# Patient Record
Sex: Male | Born: 1935 | Race: White | Hispanic: No | Smoking: Former smoker
Health system: Southern US, Community
[De-identification: ages and names within clinical notes are randomized; demographics above are authoritative.]

## PROBLEM LIST (undated history)

## (undated) DIAGNOSIS — L02519 Cutaneous abscess of unspecified hand: Secondary | ICD-10-CM

## (undated) DIAGNOSIS — I10 Essential (primary) hypertension: Secondary | ICD-10-CM

## (undated) DIAGNOSIS — F329 Major depressive disorder, single episode, unspecified: Secondary | ICD-10-CM

## (undated) DIAGNOSIS — M48061 Spinal stenosis, lumbar region without neurogenic claudication: Secondary | ICD-10-CM

## (undated) DIAGNOSIS — R21 Rash and other nonspecific skin eruption: Secondary | ICD-10-CM

## (undated) DIAGNOSIS — L0390 Cellulitis, unspecified: Secondary | ICD-10-CM

## (undated) DIAGNOSIS — N200 Calculus of kidney: Secondary | ICD-10-CM

## (undated) DIAGNOSIS — Z9981 Dependence on supplemental oxygen: Secondary | ICD-10-CM

## (undated) DIAGNOSIS — G2581 Restless legs syndrome: Secondary | ICD-10-CM

## (undated) DIAGNOSIS — L03019 Cellulitis of unspecified finger: Secondary | ICD-10-CM

## (undated) DIAGNOSIS — K279 Peptic ulcer, site unspecified, unspecified as acute or chronic, without hemorrhage or perforation: Secondary | ICD-10-CM

## (undated) DIAGNOSIS — K219 Gastro-esophageal reflux disease without esophagitis: Secondary | ICD-10-CM

## (undated) DIAGNOSIS — R0602 Shortness of breath: Secondary | ICD-10-CM

## (undated) DIAGNOSIS — M5137 Other intervertebral disc degeneration, lumbosacral region: Secondary | ICD-10-CM

## (undated) DIAGNOSIS — C189 Malignant neoplasm of colon, unspecified: Secondary | ICD-10-CM

## (undated) DIAGNOSIS — J439 Emphysema, unspecified: Secondary | ICD-10-CM

## (undated) DIAGNOSIS — F411 Generalized anxiety disorder: Secondary | ICD-10-CM

## (undated) DIAGNOSIS — J449 Chronic obstructive pulmonary disease, unspecified: Secondary | ICD-10-CM

## (undated) DIAGNOSIS — E119 Type 2 diabetes mellitus without complications: Secondary | ICD-10-CM

## (undated) DIAGNOSIS — J189 Pneumonia, unspecified organism: Secondary | ICD-10-CM

## (undated) DIAGNOSIS — J45909 Unspecified asthma, uncomplicated: Secondary | ICD-10-CM

## (undated) DIAGNOSIS — L0291 Cutaneous abscess, unspecified: Secondary | ICD-10-CM

## (undated) DIAGNOSIS — E785 Hyperlipidemia, unspecified: Secondary | ICD-10-CM

## (undated) HISTORY — DX: Peptic ulcer, site unspecified, unspecified as acute or chronic, without hemorrhage or perforation: K27.9

## (undated) HISTORY — DX: Chronic obstructive pulmonary disease, unspecified: J44.9

## (undated) HISTORY — DX: Cellulitis of unspecified finger: L03.019

## (undated) HISTORY — DX: Spinal stenosis, lumbar region without neurogenic claudication: M48.061

## (undated) HISTORY — DX: Major depressive disorder, single episode, unspecified: F32.9

## (undated) HISTORY — DX: Cutaneous abscess, unspecified: L02.91

## (undated) HISTORY — DX: Type 2 diabetes mellitus without complications: E11.9

## (undated) HISTORY — DX: Emphysema, unspecified: J43.9

## (undated) HISTORY — DX: Gastro-esophageal reflux disease without esophagitis: K21.9

## (undated) HISTORY — DX: Hyperlipidemia, unspecified: E78.5

## (undated) HISTORY — DX: Generalized anxiety disorder: F41.1

## (undated) HISTORY — DX: Restless legs syndrome: G25.81

## (undated) HISTORY — DX: Cellulitis, unspecified: L03.90

## (undated) HISTORY — DX: Other intervertebral disc degeneration, lumbosacral region: M51.37

## (undated) HISTORY — DX: Rash and other nonspecific skin eruption: R21

## (undated) HISTORY — DX: Cutaneous abscess of unspecified hand: L02.519

## (undated) HISTORY — PX: OTHER SURGICAL HISTORY: SHX169

## (undated) HISTORY — DX: Essential (primary) hypertension: I10

## (undated) SURGERY — ENDOBRONCHIAL ULTRASOUND (EBUS)
Anesthesia: General

---

## 1958-09-19 HISTORY — PX: TONSILLECTOMY: SUR1361

## 1980-09-19 HISTORY — PX: FOOT NEUROMA SURGERY: SHX646

## 1998-09-20 ENCOUNTER — Emergency Department (HOSPITAL_COMMUNITY): Admission: EM | Admit: 1998-09-20 | Discharge: 1998-09-20 | Payer: Self-pay | Admitting: Emergency Medicine

## 1998-09-20 ENCOUNTER — Encounter: Payer: Self-pay | Admitting: Emergency Medicine

## 2000-02-20 ENCOUNTER — Emergency Department (HOSPITAL_COMMUNITY): Admission: EM | Admit: 2000-02-20 | Discharge: 2000-02-20 | Payer: Self-pay | Admitting: Emergency Medicine

## 2000-02-20 ENCOUNTER — Encounter: Payer: Self-pay | Admitting: Emergency Medicine

## 2000-02-22 ENCOUNTER — Emergency Department (HOSPITAL_COMMUNITY): Admission: EM | Admit: 2000-02-22 | Discharge: 2000-02-22 | Payer: Self-pay | Admitting: *Deleted

## 2000-02-22 ENCOUNTER — Encounter: Payer: Self-pay | Admitting: *Deleted

## 2001-03-15 ENCOUNTER — Encounter: Payer: Self-pay | Admitting: Internal Medicine

## 2001-03-15 ENCOUNTER — Ambulatory Visit (HOSPITAL_COMMUNITY): Admission: RE | Admit: 2001-03-15 | Discharge: 2001-03-15 | Payer: Self-pay | Admitting: Internal Medicine

## 2001-05-14 ENCOUNTER — Inpatient Hospital Stay (HOSPITAL_COMMUNITY): Admission: RE | Admit: 2001-05-14 | Discharge: 2001-05-16 | Payer: Self-pay | Admitting: Orthopedic Surgery

## 2001-09-19 HISTORY — PX: ROTATOR CUFF REPAIR: SHX139

## 2002-05-29 ENCOUNTER — Encounter: Admission: RE | Admit: 2002-05-29 | Discharge: 2002-05-29 | Payer: Self-pay | Admitting: Internal Medicine

## 2002-05-29 ENCOUNTER — Encounter: Payer: Self-pay | Admitting: Internal Medicine

## 2003-10-21 ENCOUNTER — Inpatient Hospital Stay (HOSPITAL_COMMUNITY): Admission: EM | Admit: 2003-10-21 | Discharge: 2003-10-27 | Payer: Self-pay | Admitting: Internal Medicine

## 2004-02-20 ENCOUNTER — Ambulatory Visit (HOSPITAL_COMMUNITY): Admission: RE | Admit: 2004-02-20 | Discharge: 2004-02-20 | Payer: Self-pay | Admitting: Surgery

## 2004-02-20 ENCOUNTER — Encounter (INDEPENDENT_AMBULATORY_CARE_PROVIDER_SITE_OTHER): Payer: Self-pay | Admitting: *Deleted

## 2004-02-20 ENCOUNTER — Ambulatory Visit (HOSPITAL_BASED_OUTPATIENT_CLINIC_OR_DEPARTMENT_OTHER): Admission: RE | Admit: 2004-02-20 | Discharge: 2004-02-20 | Payer: Self-pay | Admitting: Surgery

## 2004-08-24 ENCOUNTER — Encounter: Admission: RE | Admit: 2004-08-24 | Discharge: 2004-08-24 | Payer: Self-pay | Admitting: Internal Medicine

## 2006-10-07 ENCOUNTER — Emergency Department (HOSPITAL_COMMUNITY): Admission: EM | Admit: 2006-10-07 | Discharge: 2006-10-07 | Payer: Self-pay | Admitting: Emergency Medicine

## 2007-08-05 ENCOUNTER — Emergency Department (HOSPITAL_COMMUNITY): Admission: EM | Admit: 2007-08-05 | Discharge: 2007-08-05 | Payer: Self-pay | Admitting: Emergency Medicine

## 2008-01-21 ENCOUNTER — Ambulatory Visit: Payer: Self-pay | Admitting: Cardiology

## 2008-01-21 ENCOUNTER — Inpatient Hospital Stay (HOSPITAL_COMMUNITY): Admission: EM | Admit: 2008-01-21 | Discharge: 2008-01-24 | Payer: Self-pay | Admitting: Emergency Medicine

## 2008-01-22 ENCOUNTER — Encounter (INDEPENDENT_AMBULATORY_CARE_PROVIDER_SITE_OTHER): Payer: Self-pay | Admitting: Internal Medicine

## 2008-01-24 ENCOUNTER — Ambulatory Visit: Payer: Self-pay | Admitting: Psychiatry

## 2008-11-17 ENCOUNTER — Emergency Department: Payer: Self-pay | Admitting: Emergency Medicine

## 2008-11-30 ENCOUNTER — Emergency Department (HOSPITAL_COMMUNITY): Admission: EM | Admit: 2008-11-30 | Discharge: 2008-12-01 | Payer: Self-pay | Admitting: Emergency Medicine

## 2009-02-24 ENCOUNTER — Emergency Department: Payer: Self-pay | Admitting: Unknown Physician Specialty

## 2009-02-25 ENCOUNTER — Emergency Department (HOSPITAL_COMMUNITY): Admission: EM | Admit: 2009-02-25 | Discharge: 2009-02-25 | Payer: Self-pay | Admitting: Emergency Medicine

## 2009-02-28 ENCOUNTER — Ambulatory Visit (HOSPITAL_COMMUNITY): Admission: RE | Admit: 2009-02-28 | Discharge: 2009-02-28 | Payer: Self-pay | Admitting: Emergency Medicine

## 2009-03-02 ENCOUNTER — Emergency Department (HOSPITAL_COMMUNITY): Admission: EM | Admit: 2009-03-02 | Discharge: 2009-03-02 | Payer: Self-pay | Admitting: Emergency Medicine

## 2009-03-09 ENCOUNTER — Emergency Department (HOSPITAL_COMMUNITY): Admission: EM | Admit: 2009-03-09 | Discharge: 2009-03-10 | Payer: Self-pay | Admitting: Emergency Medicine

## 2009-08-17 ENCOUNTER — Emergency Department (HOSPITAL_COMMUNITY): Admission: EM | Admit: 2009-08-17 | Discharge: 2009-08-17 | Payer: Self-pay | Admitting: Emergency Medicine

## 2009-09-27 ENCOUNTER — Emergency Department (HOSPITAL_COMMUNITY): Admission: EM | Admit: 2009-09-27 | Discharge: 2009-09-28 | Payer: Self-pay | Admitting: Emergency Medicine

## 2009-10-07 ENCOUNTER — Inpatient Hospital Stay (HOSPITAL_COMMUNITY): Admission: EM | Admit: 2009-10-07 | Discharge: 2009-10-08 | Payer: Self-pay | Admitting: Emergency Medicine

## 2009-11-03 ENCOUNTER — Ambulatory Visit: Payer: Self-pay | Admitting: Internal Medicine

## 2009-11-03 DIAGNOSIS — J441 Chronic obstructive pulmonary disease with (acute) exacerbation: Secondary | ICD-10-CM

## 2009-11-03 DIAGNOSIS — F411 Generalized anxiety disorder: Secondary | ICD-10-CM

## 2009-11-03 DIAGNOSIS — M51379 Other intervertebral disc degeneration, lumbosacral region without mention of lumbar back pain or lower extremity pain: Secondary | ICD-10-CM

## 2009-11-03 DIAGNOSIS — I10 Essential (primary) hypertension: Secondary | ICD-10-CM

## 2009-11-03 DIAGNOSIS — G2581 Restless legs syndrome: Secondary | ICD-10-CM | POA: Insufficient documentation

## 2009-11-03 DIAGNOSIS — R21 Rash and other nonspecific skin eruption: Secondary | ICD-10-CM

## 2009-11-03 DIAGNOSIS — M48061 Spinal stenosis, lumbar region without neurogenic claudication: Secondary | ICD-10-CM

## 2009-11-03 DIAGNOSIS — J439 Emphysema, unspecified: Secondary | ICD-10-CM

## 2009-11-03 DIAGNOSIS — K219 Gastro-esophageal reflux disease without esophagitis: Secondary | ICD-10-CM

## 2009-11-03 DIAGNOSIS — F329 Major depressive disorder, single episode, unspecified: Secondary | ICD-10-CM

## 2009-11-03 DIAGNOSIS — E119 Type 2 diabetes mellitus without complications: Secondary | ICD-10-CM

## 2009-11-03 DIAGNOSIS — J4489 Other specified chronic obstructive pulmonary disease: Secondary | ICD-10-CM | POA: Insufficient documentation

## 2009-11-03 DIAGNOSIS — F3289 Other specified depressive episodes: Secondary | ICD-10-CM

## 2009-11-03 DIAGNOSIS — K279 Peptic ulcer, site unspecified, unspecified as acute or chronic, without hemorrhage or perforation: Secondary | ICD-10-CM | POA: Insufficient documentation

## 2009-11-03 DIAGNOSIS — J449 Chronic obstructive pulmonary disease, unspecified: Secondary | ICD-10-CM

## 2009-11-03 DIAGNOSIS — J45909 Unspecified asthma, uncomplicated: Secondary | ICD-10-CM | POA: Insufficient documentation

## 2009-11-03 DIAGNOSIS — E785 Hyperlipidemia, unspecified: Secondary | ICD-10-CM

## 2009-11-03 DIAGNOSIS — M5137 Other intervertebral disc degeneration, lumbosacral region: Secondary | ICD-10-CM | POA: Insufficient documentation

## 2009-11-03 DIAGNOSIS — Z87442 Personal history of urinary calculi: Secondary | ICD-10-CM

## 2009-11-03 HISTORY — DX: Other intervertebral disc degeneration, lumbosacral region without mention of lumbar back pain or lower extremity pain: M51.379

## 2009-11-03 HISTORY — DX: Generalized anxiety disorder: F41.1

## 2009-11-03 HISTORY — DX: Hyperlipidemia, unspecified: E78.5

## 2009-11-03 HISTORY — DX: Other specified depressive episodes: F32.89

## 2009-11-03 HISTORY — DX: Rash and other nonspecific skin eruption: R21

## 2009-11-03 HISTORY — DX: Type 2 diabetes mellitus without complications: E11.9

## 2009-11-03 HISTORY — DX: Gastro-esophageal reflux disease without esophagitis: K21.9

## 2009-11-03 HISTORY — DX: Other intervertebral disc degeneration, lumbosacral region: M51.37

## 2009-11-03 HISTORY — DX: Essential (primary) hypertension: I10

## 2009-11-03 HISTORY — DX: Emphysema, unspecified: J43.9

## 2009-11-03 HISTORY — DX: Major depressive disorder, single episode, unspecified: F32.9

## 2009-11-03 HISTORY — DX: Peptic ulcer, site unspecified, unspecified as acute or chronic, without hemorrhage or perforation: K27.9

## 2009-11-03 HISTORY — DX: Restless legs syndrome: G25.81

## 2009-11-03 HISTORY — DX: Spinal stenosis, lumbar region without neurogenic claudication: M48.061

## 2009-11-03 HISTORY — DX: Other specified chronic obstructive pulmonary disease: J44.89

## 2009-11-03 HISTORY — DX: Chronic obstructive pulmonary disease, unspecified: J44.9

## 2009-11-03 LAB — CONVERTED CEMR LAB
Alkaline Phosphatase: 78 units/L (ref 39–117)
Basophils Absolute: 0.1 10*3/uL (ref 0.0–0.1)
Bilirubin Urine: NEGATIVE
Bilirubin, Direct: 0.3 mg/dL (ref 0.0–0.3)
CO2: 29 meq/L (ref 19–32)
Calcium: 9.8 mg/dL (ref 8.4–10.5)
Creatinine, Ser: 1.1 mg/dL (ref 0.4–1.5)
Eosinophils Absolute: 0.5 10*3/uL (ref 0.0–0.7)
Glucose, Bld: 264 mg/dL — ABNORMAL HIGH (ref 70–99)
HDL: 56.1 mg/dL (ref >39.00)
Hemoglobin, Urine: NEGATIVE
Ketones, ur: NEGATIVE mg/dL
Leukocytes, UA: NEGATIVE
Lymphocytes Relative: 13.6 % (ref 12.0–46.0)
MCHC: 33.8 g/dL (ref 30.0–36.0)
Neutrophils Relative %: 75.2 % (ref 43.0–77.0)
Nitrite: NEGATIVE
Platelets: 237 10*3/uL (ref 150.0–400.0)
RDW: 12.4 % (ref 11.5–14.6)
TSH: 1.28 microintl units/mL (ref 0.35–5.50)
Total Protein, Urine: NEGATIVE mg/dL
VLDL: 53.8 mg/dL — ABNORMAL HIGH (ref 0.0–40.0)

## 2009-11-18 ENCOUNTER — Telehealth: Payer: Self-pay | Admitting: Internal Medicine

## 2009-11-19 ENCOUNTER — Ambulatory Visit: Payer: Self-pay | Admitting: Internal Medicine

## 2009-11-23 ENCOUNTER — Telehealth: Payer: Self-pay | Admitting: Internal Medicine

## 2009-11-26 ENCOUNTER — Telehealth: Payer: Self-pay | Admitting: Internal Medicine

## 2009-12-03 ENCOUNTER — Ambulatory Visit: Payer: Self-pay | Admitting: Internal Medicine

## 2009-12-03 DIAGNOSIS — L0390 Cellulitis, unspecified: Secondary | ICD-10-CM

## 2009-12-03 DIAGNOSIS — L0291 Cutaneous abscess, unspecified: Secondary | ICD-10-CM

## 2009-12-03 HISTORY — DX: Cutaneous abscess, unspecified: L02.91

## 2010-01-20 ENCOUNTER — Encounter (INDEPENDENT_AMBULATORY_CARE_PROVIDER_SITE_OTHER): Payer: Self-pay | Admitting: Internal Medicine

## 2010-01-20 ENCOUNTER — Ambulatory Visit: Payer: Self-pay | Admitting: Cardiovascular Disease

## 2010-01-20 ENCOUNTER — Ambulatory Visit: Payer: Self-pay | Admitting: Vascular Surgery

## 2010-01-20 ENCOUNTER — Inpatient Hospital Stay (HOSPITAL_COMMUNITY): Admission: EM | Admit: 2010-01-20 | Discharge: 2010-01-23 | Payer: Self-pay | Admitting: Emergency Medicine

## 2010-01-22 ENCOUNTER — Encounter: Payer: Self-pay | Admitting: Cardiology

## 2010-04-07 ENCOUNTER — Ambulatory Visit: Payer: Self-pay | Admitting: Internal Medicine

## 2010-04-07 DIAGNOSIS — L02519 Cutaneous abscess of unspecified hand: Secondary | ICD-10-CM | POA: Insufficient documentation

## 2010-04-07 DIAGNOSIS — L03019 Cellulitis of unspecified finger: Secondary | ICD-10-CM

## 2010-04-07 HISTORY — DX: Cellulitis of unspecified finger: L03.019

## 2010-04-07 HISTORY — DX: Cutaneous abscess of unspecified hand: L02.519

## 2010-04-30 ENCOUNTER — Encounter: Payer: Self-pay | Admitting: Internal Medicine

## 2010-05-03 ENCOUNTER — Ambulatory Visit: Payer: Self-pay | Admitting: Internal Medicine

## 2010-05-20 ENCOUNTER — Inpatient Hospital Stay (HOSPITAL_COMMUNITY): Admission: EM | Admit: 2010-05-20 | Discharge: 2010-05-20 | Payer: Self-pay | Admitting: Emergency Medicine

## 2010-05-23 ENCOUNTER — Inpatient Hospital Stay (HOSPITAL_COMMUNITY): Admission: EM | Admit: 2010-05-23 | Discharge: 2010-05-26 | Payer: Self-pay | Admitting: Emergency Medicine

## 2010-05-27 ENCOUNTER — Ambulatory Visit: Payer: Self-pay | Admitting: Psychiatry

## 2010-07-24 ENCOUNTER — Inpatient Hospital Stay (HOSPITAL_COMMUNITY): Admission: EM | Admit: 2010-07-24 | Discharge: 2010-07-24 | Payer: Self-pay | Admitting: Emergency Medicine

## 2010-09-05 ENCOUNTER — Inpatient Hospital Stay (HOSPITAL_COMMUNITY)
Admission: EM | Admit: 2010-09-05 | Discharge: 2010-09-06 | Payer: Self-pay | Source: Home / Self Care | Attending: Internal Medicine | Admitting: Internal Medicine

## 2010-09-06 ENCOUNTER — Encounter (INDEPENDENT_AMBULATORY_CARE_PROVIDER_SITE_OTHER): Payer: Self-pay | Admitting: Internal Medicine

## 2010-09-07 ENCOUNTER — Emergency Department (HOSPITAL_COMMUNITY)
Admission: EM | Admit: 2010-09-07 | Discharge: 2010-09-08 | Payer: Self-pay | Source: Home / Self Care | Admitting: Emergency Medicine

## 2010-09-09 ENCOUNTER — Ambulatory Visit: Payer: Self-pay | Admitting: Internal Medicine

## 2010-10-10 ENCOUNTER — Encounter: Payer: Self-pay | Admitting: Internal Medicine

## 2010-10-19 NOTE — Progress Notes (Signed)
  Phone Note Refill Request  on November 18, 2009 11:30 AM  Refills Requested: Medication #1:  ADVAIR DISKUS 100-50 MCG/DOSE AEPB 1 puff in the morning 1 puff at bedtime   Dosage confirmed as above?Dosage Confirmed Initial call taken by: Scharlene Gloss,  November 18, 2009 11:30 AM    Prescriptions: ADVAIR DISKUS 100-50 MCG/DOSE AEPB (FLUTICASONE-SALMETEROL) 1 puff in the morning 1 puff at bedtime  #1 x 5   Entered by:   Scharlene Gloss   Authorized by:   [name] MD   Signed by:   Scharlene Gloss on 11/18/2009   Method used:   Faxed to ...       [facility] [place] [phone]* (retail)       [phone]       [place], [place]  [address]       Ph: [phone] or [phone]       Fax: [phone]   RxID:   [id number]

## 2010-10-19 NOTE — Progress Notes (Signed)
  Phone Note Refill Request  on November 26, 2009 4:34 PM  Refills Requested: Medication #1:  METFORMIN HCL 500 MG XR24H-TAB 3 by mouth once daily   Dosage confirmed as above?Dosage Confirmed Initial call taken by: Scharlene Gloss,  November 26, 2009 4:34 PM    Prescriptions: METFORMIN HCL 500 MG XR24H-TAB (METFORMIN HCL) 3 by mouth once daily  #90 x 3   Entered by:   Scharlene Gloss   Authorized by:   [name] MD   Signed by:   Scharlene Gloss on 11/26/2009   Method used:   Electronically to        [facility] [phone]* (retail)       [address]       [place], [place]  [address]       Ph: [phone] or [phone]       Fax: [phone]   RxID:   [id number]

## 2010-10-19 NOTE — Assessment & Plan Note (Signed)
Summary: BURNING GROIN W/2 KNOTS--STC   Vital Signs:  Patient profile:   77 year old male Height:      71 inches Weight:      198 pounds BMI:     27.72 O2 Sat:      94 % on Room air Temp:     97.2 degrees F oral Pulse rate:   87 / minute BP sitting:   128 / 72  (left arm) Cuff size:   regular  Vitals Entered ByZella Ball Ewing (December 03, 2009 11:17 AM)  O2 Flow:  Room air CC: Knots on groin, BS problems/RE   CC:  Knots on groin and BS problems/RE.  History of Present Illness: overall doing ok with cbg's somewhat variable but overall less than 140 - Pt denies polydipsia, polyuria, or low sugar symptoms such as shakiness improved with eating.  Overall good compliance with meds, trying to follow low chol, DM diet, wt stable, little excercise however   Pt denies CP, sob, doe, wheezing, orthopnea, pnd, worsening LE edema, palps, dizziness or syncope  .Pt denies new neuro symptoms such as headache, facial or extremity weakness   But unfortunately here with a new problem with 2 signficant firm tender knots x 3 days to the right scrotal/perineal area.  Denies GU symtpoms such as urgency, freq or hematuria, and no drainage.  No known hx of MRSA. No fever, chills, n/v.  Problems Prior to Update: 1)  Abscess  (ICD-682.9) 2)  Preventive Health Care  (ICD-V70.0) 3)  Rash-nonvesicular  (ICD-782.1) 4)  Chronic Obstructive Pulmonary Disease, Acute Exacerbation  (ICD-491.21) 5)  Emphysema, Bullous  (ICD-492.0) 6)  Peptic Ulcer Disease  (ICD-533.90) 7)  Gerd  (ICD-530.81) 8)  Nephrolithiasis, Hx of  (ICD-V13.01) 9)  Diabetes Mellitus, Type II  (ICD-250.00) 10)  Spinal Stenosis, Lumbar  (ICD-724.02) 11)  Disc Disease, Lumbar  (ICD-722.52) 12)  Anxiety  (ICD-300.00) 13)  Depression  (ICD-311) 14)  Restless Leg Syndrome  (ICD-333.94) 15)  Hypertension  (ICD-401.9) 16)  Hyperlipidemia  (ICD-272.4) 17)  COPD  (ICD-496) 18)  Asthma  (ICD-493.90)  Medications Prior to Update: 1)  Simvastatin 40 Mg  Tabs (Simvastatin) .Marland Kitchen.. 1 By Mouth Once Daily 2)  Hydrochlorothiazide 25 Mg Tabs (Hydrochlorothiazide) .Marland Kitchen.. 1 By Mouth Once Daily 3)  Advair Diskus 100-50 Mcg/dose Aepb (Fluticasone-Salmeterol) .Marland Kitchen.. 1 Puff in The Morning 1 Puff At Bedtime 4)  Metformin Hcl 500 Mg Xr24h-Tab (Metformin Hcl) .... 3 By Mouth Once Daily 5)  Ketoconazole 200 Mg Tabs (Ketoconazole) .Marland Kitchen.. 1po Two Times A Day For 5 Days 6)  Lotrisone 1-0.05 % Crea (Clotrimazole-Betamethasone) .... Use Asd Two Times A Day As Needed 7)  Onetouch Ultra Test  Strp (Glucose Blood) .... Use Asd 1 Once Daily   250.02 8)  Lancets  Misc (Lancets) .... Use Asd 1 Once Daily   250.02  Current Medications (verified): 1)  Simvastatin 40 Mg Tabs (Simvastatin) .Marland Kitchen.. 1 By Mouth Once Daily 2)  Hydrochlorothiazide 25 Mg Tabs (Hydrochlorothiazide) .Marland Kitchen.. 1 By Mouth Once Daily 3)  Advair Diskus 100-50 Mcg/dose Aepb (Fluticasone-Salmeterol) .Marland Kitchen.. 1 Puff in The Morning 1 Puff At Bedtime 4)  Metformin Hcl 500 Mg Xr24h-Tab (Metformin Hcl) .... 3 By Mouth Once Daily 5)  Ketoconazole 200 Mg Tabs (Ketoconazole) .Marland Kitchen.. 1po Two Times A Day For 5 Days 6)  Lotrisone 1-0.05 % Crea (Clotrimazole-Betamethasone) .... Use Asd Two Times A Day As Needed 7)  Onetouch Ultra Test  Strp (Glucose Blood) .... Use Asd 1 Once Daily  250.02 8)  Lancets  Misc (Lancets) .... Use Asd 1 Once Daily   250.02 9)  Septra Ds 800-160 Mg Tabs (Sulfamethoxazole-Trimethoprim) .Marland Kitchen.. 1po Two Times A Day  Allergies (verified): No Known Drug Allergies  Past History:  Past Medical History: Last updated: 11/03/2009 SPINAL STENOSIS, LUMBAR (ICD-724.02) DISC DISEASE, LUMBAR (ICD-722.52) ANXIETY (ICD-300.00) DEPRESSION (ICD-311) RESTLESS LEG SYNDROME (ICD-333.94) HYPERTENSION (ICD-401.9) HYPERLIPIDEMIA (ICD-272.4) COPD (ICD-496)/severe bullous underlying emphysema  ASTHMA (ICD-493.90) neg stress myoview may 2009 Diabetes mellitus, type II Nephrolithiasis, hx of GERD Peptic ulcer disease - 1987  - duodenal ulcer  Past Surgical History: Last updated: 11/03/2009 Rotator cuff repair - 2002 s/p left foot surgury - morton's neuroma Tonsillectomy  Social History: Last updated: 11/03/2009 widower since 2010 2 children - 1 lives close Alcohol use-no 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   Head:  normocephalic and atraumatic.   Eyes:  vision grossly intact, pupils equal, and pupils round.   Ears:  R ear normal and L ear normal.   Nose:  no external deformity and no nasal discharge.   Mouth:  no gingival abnormalities and pharynx pink and moist.   Neck:  supple and no masses.   Lungs:  normal respiratory effort and normal breath sounds.   Heart:  normal rate and regular rhythm.   Abdomen:  soft, non-tender, and normal bowel sounds.   Genitalia:  right scrotal/groin junction with 2 subq indruated areas approx 1cm each, without drainage, but mod tender and general area warm with mild swelling; no fluctuance noted either site;  male genitals o/w neg Extremities:  no edema, no erythema    Impression & Recommendations:  Problem # 1:  ABSCESS (ICD-682.9)  His updated medication list for this problem includes:    Septra Ds 800-160 Mg Tabs (Sulfamethoxazole-trimethoprim) .Marland Kitchen... 1po two times a day to right scrotal/groin area; treat as above, f/u any worsening signs or symptoms , no evidcne or suggestion of fournier's;  pt declines pain med; to f/u any worsening s/s  Problem # 2:  DIABETES MELLITUS, TYPE II (ICD-250.00)  His updated medication list for this problem includes:    Metformin Hcl 500 Mg Xr24h-tab (Metformin hcl) .Marland KitchenMarland KitchenMarland KitchenMarland Kitchen 3 by mouth once daily  Labs Reviewed: Creat: 1.1 (11/03/2009)    improved, Continue all previous medications as before this visit , reassured, f/u labs next  visit  Problem # 3:  HYPERTENSION (ICD-401.9)  His updated medication list for this problem includes:    Hydrochlorothiazide 25 Mg Tabs (Hydrochlorothiazide) .Marland Kitchen... 1 by mouth once daily  BP today: 128/72 Prior BP: 130/68 (11/19/2009)  Labs Reviewed: K+: 3.6 (11/03/2009) Creat: : 1.1 (11/03/2009)   Chol: 171 (11/03/2009)   HDL: 56.10 (11/03/2009)   TG: 269.0 (11/03/2009) stable overall by hx and exam, ok to continue meds/tx as is   Complete Medication List: 1)  Simvastatin 40 Mg Tabs (Simvastatin) .Marland Kitchen.. 1 by mouth once daily 2)  Hydrochlorothiazide 25 Mg Tabs (Hydrochlorothiazide) .Marland Kitchen.. 1 by mouth once daily 3)  Advair Diskus 100-50 Mcg/dose Aepb (Fluticasone-salmeterol) .Marland Kitchen.. 1 puff in the morning 1 puff at bedtime 4)  Metformin Hcl 500 Mg Xr24h-tab (Metformin hcl) .... 3 by mouth once daily 5)  Ketoconazole 200 Mg Tabs (Ketoconazole) .Marland Kitchen.. 1po two times a day for 5 days 6)  Lotrisone 1-0.05 %  Crea (Clotrimazole-betamethasone) .... Use asd two times a day as needed 7)  Onetouch Ultra Test Strp (Glucose blood) .... Use asd 1 once daily   250.02 8)  Lancets Misc (Lancets) .... Use asd 1 once daily   250.02 9)  Septra Ds 800-160 Mg Tabs (Sulfamethoxazole-trimethoprim) .Marland Kitchen.. 1po two times a day  Other Orders: Glucose, (CBG) (04540) Fingerstick (98119)  Patient Instructions: 1)  Please take all new medications as prescribed 2)  Continue all previous medications as before this visit  3)  Please schedule a follow-up appointment as needed. Prescriptions: SEPTRA DS 800-160 MG TABS (SULFAMETHOXAZOLE-TRIMETHOPRIM) 1po two times a day  #20 x 0   Entered and Authorized by:   [name] MD   Signed by:   [name] MD on 12/03/2009   Method used:   Print then Give to Patient   RxID:   [id number]

## 2010-10-19 NOTE — Assessment & Plan Note (Signed)
Summary: rash in groin area-lb   Vital Signs:  Patient profile:   77 year old male Height:      70 inches Weight:      204.50 pounds BMI:     29.45 O2 Sat:      96 % on Room air Temp:     97.3 degrees F oral Pulse rate:   98 / minute BP sitting:   130 / 68  (left arm) Cuff size:   large  Vitals Entered ByZella Ball Ewing (November 19, 2009 9:09 AM)  O2 Flow:  Room air  CC: rash in groin, discuss labs, BS elevated/RE   CC:  rash in groin, discuss labs, and BS elevated/RE.  History of Present Illness: here to f/u rash - wondering if it is worse since it still itches some to the scrotum despite completing the med;  Pt denies CP, sob, doe, wheezing, orthopnea, pnd, worsening LE edema, palps, dizziness or syncope   Pt denies new neuro symptoms such as headache, facial or extremity weakness   Pt denies polydipsia, polyuria, or low sugar symptoms such as shakiness improved with eating.  Overall good compliance with meds, trying to follow low chol, DM diet, wt stable, little excercise however Overall tolerating meds well .  CBG's at home often high 200s or 300.  No fever or chills or GU symtpoms.    Problems Prior to Update: 1)  Preventive Health Care  (ICD-V70.0) 2)  Rash-nonvesicular  (ICD-782.1) 3)  Chronic Obstructive Pulmonary Disease, Acute Exacerbation  (ICD-491.21) 4)  Emphysema, Bullous  (ICD-492.0) 5)  Peptic Ulcer Disease  (ICD-533.90) 6)  Gerd  (ICD-530.81) 7)  Nephrolithiasis, Hx of  (ICD-V13.01) 8)  Diabetes Mellitus, Type II  (ICD-250.00) 9)  Spinal Stenosis, Lumbar  (ICD-724.02) 10)  Disc Disease, Lumbar  (ICD-722.52) 11)  Anxiety  (ICD-300.00) 12)  Depression  (ICD-311) 13)  Restless Leg Syndrome  (ICD-333.94) 14)  Hypertension  (ICD-401.9) 15)  Hyperlipidemia  (ICD-272.4) 16)  COPD  (ICD-496) 17)  Asthma  (ICD-493.90)  Medications Prior to Update: 1)  Simvastatin 40 Mg Tabs (Simvastatin) .Marland Kitchen.. 1 By Mouth Once Daily 2)  Hydrochlorothiazide 25 Mg Tabs  (Hydrochlorothiazide) .Marland Kitchen.. 1 By Mouth Once Daily 3)  Advair Diskus 100-50 Mcg/dose Aepb (Fluticasone-Salmeterol) .Marland Kitchen.. 1 Puff in The Morning 1 Puff At Bedtime 4)  Metformin Hcl 500 Mg Tabs (Metformin Hcl) .Marland Kitchen.. 1po Once Daily 5)  Ketoconazole 200 Mg Tabs (Ketoconazole) .Marland Kitchen.. 1po Two Times A Day For 5 Days  Current Medications (verified): 1)  Simvastatin 40 Mg Tabs (Simvastatin) .Marland Kitchen.. 1 By Mouth Once Daily 2)  Hydrochlorothiazide 25 Mg Tabs (Hydrochlorothiazide) .Marland Kitchen.. 1 By Mouth Once Daily 3)  Advair Diskus 100-50 Mcg/dose Aepb (Fluticasone-Salmeterol) .Marland Kitchen.. 1 Puff in The Morning 1 Puff At Bedtime 4)  Metformin Hcl 500 Mg Xr24h-Tab (Metformin Hcl) .... 3 By Mouth Once Daily 5)  Ketoconazole 200 Mg Tabs (Ketoconazole) .Marland Kitchen.. 1po Two Times A Day For 5 Days 6)  Lotrisone 1-0.05 % Crea (Clotrimazole-Betamethasone) .... Use Asd Two Times A Day As Needed 7)  Onetouch Ultra Test  Strp (Glucose Blood) .... Use Asd 1 Once Daily   250.02 8)  Lancets  Misc (Lancets) .... Use Asd 1 Once Daily   250.02  Allergies (verified): No Known Drug Allergies  Past History:  Past Medical History: Last updated: 11/03/2009 SPINAL STENOSIS, LUMBAR (ICD-724.02) DISC DISEASE, LUMBAR (ICD-722.52) ANXIETY (ICD-300.00) DEPRESSION (ICD-311) RESTLESS LEG SYNDROME (ICD-333.94) HYPERTENSION (ICD-401.9) HYPERLIPIDEMIA (ICD-272.4) COPD (ICD-496)/severe bullous underlying emphysema  ASTHMA (ICD-493.90)  neg stress myoview may 2009 Diabetes mellitus, type II Nephrolithiasis, hx of GERD Peptic ulcer disease - 1987 - duodenal ulcer  Past Surgical History: Last updated: Nov 12, 2009 Rotator cuff repair - 2002 s/p left foot surgury - morton's neuroma Tonsillectomy  Family History: Last updated: November 12, 2009 CAD - father died with MI in his 63s mother with heart disease brother with lung cancer  Social History: Last updated: 11-12-09 widower since 2010 2 children - 1 lives close Alcohol use-no 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   Head:  normocephalic and atraumatic.   Eyes:  vision grossly intact, pupils equal, and pupils round.   Ears:  R ear normal and L ear normal.   Nose:  no external deformity and no nasal discharge.   Mouth:  no gingival abnormalities and pharynx pink and moist.   Neck:  supple and no masses.   Lungs:  normal respiratory effort and normal breath sounds.   Heart:  normal rate and regular rhythm.   Extremities:  no edema, no erythema  Skin:  scrotum with with much improved but still somewhat erythem diffuse nonvesicular nontender rash with no significant swelling; scrotum and contents o/w normal to palpations   Impression & Recommendations:  Problem # 1:  RASH-NONVESICULAR (ICD-782.1)  improved,  but will do the nizoral repeat, and lotirsone asd as needed as well   His updated medication list for this problem includes:    Lotrisone 1-0.05 % Crea (Clotrimazole-betamethasone) ..... Use asd two times a day as needed  Problem # 2:  DIABETES MELLITUS, TYPE II (ICD-250.00)  His updated medication list for this problem includes:    Metformin Hcl 500 Mg Xr24h-tab (Metformin hcl) .Marland KitchenMarland KitchenMarland KitchenMarland Kitchen 3 by mouth once daily  Orders: Glucose, (CBG) [phone])  Labs Reviewed: Creat: 1.1 (11-12-2009)     cbg elevated today, will add low dose OHA, but will need to check cbg's at home more closely and f/u a1c  Problem # 3:  CHRONIC OBSTRUCTIVE PULMONARY DISEASE, ACUTE EXACERBATION (ICD-491.21) resolved, reassured  Problem # 4:  HYPERTENSION (ICD-401.9)  His updated medication list for this problem includes:    Hydrochlorothiazide 25 Mg Tabs (Hydrochlorothiazide) .Marland Kitchen... 1 by mouth once daily  BP today: 130/68 Prior BP: 130/70 (November 12, 2009)  Labs Reviewed: K+: 3.6 (2009/11/12) Creat: : 1.1  (2009-11-12)   Chol: 171 (11-12-09)   HDL: 56.10 (11/12/2009)   TG: 269.0 (November 12, 2009) stable overall by hx and exam, ok to continue meds/tx as is   Complete Medication List: 1)  Simvastatin 40 Mg Tabs (Simvastatin) .Marland Kitchen.. 1 by mouth once daily 2)  Hydrochlorothiazide 25 Mg Tabs (Hydrochlorothiazide) .Marland Kitchen.. 1 by mouth once daily 3)  Advair Diskus 100-50 Mcg/dose Aepb (Fluticasone-salmeterol) .Marland Kitchen.. 1 puff in the morning 1 puff at bedtime 4)  Metformin Hcl 500 Mg Xr24h-tab (Metformin hcl) .... 3 by mouth once daily 5)  Ketoconazole 200 Mg Tabs (Ketoconazole) .Marland Kitchen.. 1po two times a day for 5 days 6)  Lotrisone 1-0.05 % Crea (Clotrimazole-betamethasone) .... Use asd two times a day as needed 7)  Onetouch Ultra Test Strp (Glucose blood) .... Use asd 1 once daily   250.02 8)  Lancets Misc (Lancets) .... Use asd 1 once daily   250.02  Patient Instructions: 1)  for now, take 2 of the metformin that you have at  home in the AM 2)  with the next prescription, take 3 in the AM  If the sugars before dinner most days is still over 150 3)  call or return in  to 4 wks if your blood sugars on 3 pills are still over 150  4)  Please schedule a follow-up appointment in August 2011 with: 5)  BMP prior to visit, ICD-9: 250.02 6)  Lipid Panel prior to visit, ICD-9: 7)  HbgA1C prior to visit, ICD-9: Prescriptions: LANCETS  MISC (LANCETS) use asd 1 once daily   250.02  #100 x 11   Entered and Authorized by:   [name] MD   Signed by:   [name] MD on 11/19/2009   Method used:   Print then Give to Patient   RxID:   [id number] ONETOUCH ULTRA TEST  STRP (GLUCOSE BLOOD) use asd 1 once daily   250.02  #100 x 11   Entered and Authorized by:   [name] MD   Signed by:   [name] MD on 11/19/2009   Method used:   Print then Give to Patient   RxID:   [id number] LOTRISONE 1-0.05 % CREA (CLOTRIMAZOLE-BETAMETHASONE) use asd two times a day as needed  #1 x 1   Entered and Authorized by:   [name] MD   Signed by:   [name] MD on 11/19/2009   Method used:   Print then Give to Patient   RxID:   [id number] KETOCONAZOLE 200 MG TABS (KETOCONAZOLE) 1po two times a day for 5 days  #10 x 0   Entered and Authorized by:   [name] MD   Signed by:   [name] MD on 11/19/2009   Method used:   Print then Give to Patient   RxID:   [id number]

## 2010-10-19 NOTE — Progress Notes (Signed)
----   Converted from flag ---- ---- 11/22/2009 3:31 PM, [name] MD wrote: please call pt for pharmacy to send the metformin rx to, then send rx please ------------------------------  called pt left msg to call back  Called pt informed to have pharmacy send Metformin to our office, pt agreed to do and we would fill

## 2010-10-19 NOTE — Assessment & Plan Note (Signed)
Summary: FINGER AND HAND PAIN  STC   Vital Signs:  Patient profile:   77 year old male Height:      70 inches Weight:      193.50 pounds BMI:     27.86 O2 Sat:      95 % on Room air Temp:     96.9 degrees F oral Pulse rate:   103 / minute BP sitting:   122 / 70  (left arm) Cuff size:   regular  Vitals Entered By: Zella Ball Ewing CMA (AAMA) (April 07, 2010 2:06 PM)  O2 Flow:  Room air CC: Right hand ring finger, swollen and red for 1 week, Discuss BS/RE   CC:  Right hand ring finger, swollen and red for 1 week, and Discuss BS/RE.  History of Present Illness: here with acute onset x 1 -2 days severe red, tender , swelling to the post 4th finger between the DIP and PIP, at the site of striking the finger on some type of wood while playing with a dog several days ago;  had immediate pain and felt "something" there and thinks may have a splinter there, but did not fester up until last PM;  no fever, chills, red streak or hand swelling, but pain quite severe to touch.  even before onset of the swelling, he has had inecreased cbg's in the 190's often, Pt denies polydipsia, polyuria, or low sugar symptoms such as shakiness improved with eating.  Overall good compliance with meds, trying to follow low chol, DM diet, wt stable, little excercise however  Pt denies CP, sob, doe, wheezing, orthopnea, pnd, worsening LE edema, palps, dizziness or syncope  Pt denies new neuro symptoms such as headache, facial or extremity weakness    Problems Prior to Update: 1)  Abscess  (ICD-682.9) 2)  Preventive Health Care  (ICD-V70.0) 3)  Rash-nonvesicular  (ICD-782.1) 4)  Chronic Obstructive Pulmonary Disease, Acute Exacerbation  (ICD-491.21) 5)  Emphysema, Bullous  (ICD-492.0) 6)  Peptic Ulcer Disease  (ICD-533.90) 7)  Gerd  (ICD-530.81) 8)  Nephrolithiasis, Hx of  (ICD-V13.01) 9)  Diabetes Mellitus, Type II  (ICD-250.00) 10)  Spinal Stenosis, Lumbar  (ICD-724.02) 11)  Disc Disease, Lumbar  (ICD-722.52) 12)   Anxiety  (ICD-300.00) 13)  Depression  (ICD-311) 14)  Restless Leg Syndrome  (ICD-333.94) 15)  Hypertension  (ICD-401.9) 16)  Hyperlipidemia  (ICD-272.4) 17)  COPD  (ICD-496) 18)  Asthma  (ICD-493.90)  Medications Prior to Update: 1)  Simvastatin 40 Mg Tabs (Simvastatin) .Marland Kitchen.. 1 By Mouth Once Daily 2)  Hydrochlorothiazide 25 Mg Tabs (Hydrochlorothiazide) .Marland Kitchen.. 1 By Mouth Once Daily 3)  Advair Diskus 100-50 Mcg/dose Aepb (Fluticasone-Salmeterol) .Marland Kitchen.. 1 Puff in The Morning 1 Puff At Bedtime 4)  Metformin Hcl 500 Mg Xr24h-Tab (Metformin Hcl) .... 3 By Mouth Once Daily 5)  Ketoconazole 200 Mg Tabs (Ketoconazole) .Marland Kitchen.. 1po Two Times A Day For 5 Days 6)  Lotrisone 1-0.05 % Crea (Clotrimazole-Betamethasone) .... Use Asd Two Times A Day As Needed 7)  Onetouch Ultra Test  Strp (Glucose Blood) .... Use Asd 1 Once Daily   250.02 8)  Lancets  Misc (Lancets) .... Use Asd 1 Once Daily   250.02 9)  Septra Ds 800-160 Mg Tabs (Sulfamethoxazole-Trimethoprim) .Marland Kitchen.. 1po Two Times A Day 10)  Prodigy Blood Glucose Test  Strp (Glucose Blood) .... Test As Directed Three Times A Day 11)  Prodigy Lancets 28g  Misc (Lancets) .... Test As Directed Three Times A Day  Current Medications (verified): 1)  Simvastatin  40 Mg Tabs (Simvastatin) .Marland Kitchen.. 1 By Mouth Once Daily 2)  Hydrochlorothiazide 25 Mg Tabs (Hydrochlorothiazide) .Marland Kitchen.. 1 By Mouth Once Daily 3)  Advair Diskus 100-50 Mcg/dose Aepb (Fluticasone-Salmeterol) .Marland Kitchen.. 1 Puff in The Morning 1 Puff At Bedtime 4)  Metformin Hcl 500 Mg Xr24h-Tab (Metformin Hcl) .... 4 By Mouth Once Daily 5)  Ketoconazole 200 Mg Tabs (Ketoconazole) .Marland Kitchen.. 1po Two Times A Day For 5 Days 6)  Lotrisone 1-0.05 % Crea (Clotrimazole-Betamethasone) .... Use Asd Two Times A Day As Needed 7)  Onetouch Ultra Test  Strp (Glucose Blood) .... Use Asd 1 Once Daily   250.02 8)  Lancets  Misc (Lancets) .... Use Asd 1 Once Daily   250.02 9)  Septra Ds 800-160 Mg Tabs (Sulfamethoxazole-Trimethoprim) .Marland Kitchen.. 1po Two  Times A Day 10)  Prodigy Blood Glucose Test  Strp (Glucose Blood) .... Test As Directed Three Times A Day 11)  Prodigy Lancets 28g  Misc (Lancets) .... Test As Directed Three Times A Day  Allergies (verified): No Known Drug Allergies  Past History:  Past Medical History: Last updated: 11/03/2009 SPINAL STENOSIS, LUMBAR (ICD-724.02) DISC DISEASE, LUMBAR (ICD-722.52) ANXIETY (ICD-300.00) DEPRESSION (ICD-311) RESTLESS LEG SYNDROME (ICD-333.94) HYPERTENSION (ICD-401.9) HYPERLIPIDEMIA (ICD-272.4) COPD (ICD-496)/severe bullous underlying emphysema  ASTHMA (ICD-493.90) neg stress myoview may 2009 Diabetes mellitus, type II Nephrolithiasis, hx of GERD Peptic ulcer disease - 1987 - duodenal ulcer  Past Surgical History: Last updated: 11/03/2009 Rotator cuff repair - 2002 s/p left foot surgury - morton's neuroma Tonsillectomy  Social History: Last updated: 11/03/2009 widower since 2010 2 children - 1 lives close Alcohol use-no 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   Head:  normocephalic and atraumatic.   Eyes:  vision grossly intact, pupils equal, and pupils round.   Ears:  R ear normal and L ear normal.   Nose:  no external deformity and no nasal discharge.   Mouth:  no gingival abnormalities and pharynx pink and moist.   Neck:  supple and no masses.   Lungs:  normal respiratory effort and normal breath sounds.   Heart:  normal rate and regular rhythm.   Msk:  no joint tenderness and no joint swelling.   Extremities:  no edema, no erythema  Skin:  4th finger right hand post aspect only, between DIP and PIP with 1.5 cm area severe red raised area iwth marked tender and fluctuance, but no d/c;  also dark area of splinter likely noted as well Psych:  moderately anxious.      Impression & Recommendations:  Problem # 1:  ABSCESS, FINGER (ICD-681.00)  His updated medication list for this problem includes:    Septra Ds 800-160 Mg Tabs (Sulfamethoxazole-trimethoprim) .Marland Kitchen... 1po two times a day 4th finger right hand, with ? wood splinter foreign body - for repeat antibx, refer hand surgeon today if possible  Orders: Orthopedic Referral (Ortho)  Problem # 2:  DIABETES MELLITUS, TYPE II (ICD-250.00)  His updated medication list for this problem includes:    Metformin Hcl 500 Mg Xr24h-tab (Metformin hcl) .Marland KitchenMarland KitchenMarland KitchenMarland Kitchen 4 by mouth once daily to incr the metformin to 4 per day  Labs Reviewed: Creat: 1.1 (11/03/2009)     Problem # 3:  HYPERTENSION (ICD-401.9)  His updated medication list for this problem includes:    Hydrochlorothiazide 25  Mg Tabs (Hydrochlorothiazide) .Marland Kitchen... 1 by mouth once daily  BP today: 122/70 Prior BP: 128/72 (12/03/2009)  Labs Reviewed: K+: 3.6 (11/03/2009) Creat: : 1.1 (11/03/2009)   Chol: 171 (11/03/2009)   HDL: 56.10 (11/03/2009)   TG: 269.0 (11/03/2009) stable overall by hx and exam, ok to continue meds/tx as is   Complete Medication List: 1)  Simvastatin 40 Mg Tabs (Simvastatin) .Marland Kitchen.. 1 by mouth once daily 2)  Hydrochlorothiazide 25 Mg Tabs (Hydrochlorothiazide) .Marland Kitchen.. 1 by mouth once daily 3)  Advair Diskus 100-50 Mcg/dose Aepb (Fluticasone-salmeterol) .Marland Kitchen.. 1 puff in the morning 1 puff at bedtime 4)  Metformin Hcl 500 Mg Xr24h-tab (Metformin hcl) .... 4 by mouth once daily 5)  Ketoconazole 200 Mg Tabs (Ketoconazole) .Marland Kitchen.. 1po two times a day for 5 days 6)  Lotrisone 1-0.05 % Crea (Clotrimazole-betamethasone) .... Use asd two times a day as needed 7)  Onetouch Ultra Test Strp (Glucose blood) .... Use asd 1 once daily   250.02 8)  Lancets Misc (Lancets) .... Use asd 1 once daily   250.02 9)  Septra Ds 800-160 Mg Tabs (Sulfamethoxazole-trimethoprim) .Marland Kitchen.. 1po two times a day 10)  Prodigy Blood Glucose Test Strp (Glucose blood) .... Test as  directed three times a day 11)  Prodigy Lancets 28g Misc (Lancets) .... Test as directed three times a day  Patient Instructions: 1)  Please take all new medications as prescribed - increase the metformin to 4 pills in the AM, as well as the antibiotic 2)  Continue all previous medications as before this visit  3)  You will be contacted about the referral(s) to: hand surgeon 4)  Please schedule a follow-up appointment in 2 months with CPX labs Prescriptions: METFORMIN HCL 500 MG XR24H-TAB (METFORMIN HCL) 4 by mouth once daily  #120 x 11   Entered and Authorized by:   [name] MD   Signed by:   [name] MD on 04/07/2010   Method used:   Print then Give to Patient   RxID:   [id number] SEPTRA DS 800-160 MG TABS (SULFAMETHOXAZOLE-TRIMETHOPRIM) 1po two times a day  #20 x 0   Entered and Authorized by:   [name] MD   Signed by:   [name] MD on 04/07/2010   Method used:   Print then Give to Patient   RxID:   [id number]

## 2010-10-19 NOTE — Progress Notes (Signed)
  Phone Note Call from Patient   Caller: Patient Summary of Call: Patient requested Metformin to be refilled to [facility] on [address]. Pt was instructed at last ov to take 2 daily which pt is going to do. He also stated his BS does not seem to be coming down and wanted advice on what to do. Informed he would need to schedule ov with Dr. Jonny Ruiz. Pt agreed to call and schedule next week. Initial call taken by: Scharlene Gloss,  November 26, 2009 4:45 PM

## 2010-10-19 NOTE — Medication Information (Signed)
Summary: Diabetes Supplies/HDIS  Diabetes Supplies/HDIS   Imported By: Sherian Rein 05/04/2010 12:28:46  _____________________________________________________________________  External Attachment:    Type:   Image     Comment:   External Document

## 2010-10-19 NOTE — Assessment & Plan Note (Signed)
Summary: NEW/ AARP / CX'D LAST TIME DUE TO HOSP STAY /NWS  #   Vital Signs:  Patient profile:   77 year old male Height:      69 inches Weight:      203 pounds BMI:     30.09 O2 Sat:      93 % on Room air Temp:     97.3 degrees F oral Pulse rate:   104 / minute BP sitting:   130 / 70  (left arm) Cuff size:   regular  Vitals Entered ByZella Ball Ewing (12-Nov-2009 9:24 AM)  O2 Flow:  Room air  Preventive Care Screening  Last Pneumovax:    Date:  09/19/2005    Results:  given   Last Tetanus Booster:    Date:  09/20/2003    Results:  Tdap      declines flu shot and colonoscopy  CC: New Pt. get established/RE   CC:  New Pt. get established/RE.  History of Present Illness: overall doing well, no complaints; denies polys or low sugars or hx of DM;  Pt denies CP, sob, doe, wheezing, orthopnea, pnd, worsening LE edema, palps, dizziness or syncope  Pt denies new neuro symptoms such as headache, facial or extremity weakness   Pt denies polydipsia, polyuria, or low sugar symptoms such as shakiness improved with eating.  Overall good compliance with meds, trying to follow low chol, diet, wt stable, little excercise however   Preventive Screening-Counseling & Management  Alcohol-Tobacco     Smoking Status: quit      Drug Use:  no.    Problems Prior to Update: 1)  Preventive Health Care  (ICD-V70.0) 2)  Rash-nonvesicular  (ICD-782.1) 3)  Chronic Obstructive Pulmonary Disease, Acute Exacerbation  (ICD-491.21) 4)  Emphysema, Bullous  (ICD-492.0) 5)  Peptic Ulcer Disease  (ICD-533.90) 6)  Gerd  (ICD-530.81) 7)  Nephrolithiasis, Hx of  (ICD-V13.01) 8)  Diabetes Mellitus, Type II  (ICD-250.00) 9)  Spinal Stenosis, Lumbar  (ICD-724.02) 10)  Disc Disease, Lumbar  (ICD-722.52) 11)  Anxiety  (ICD-300.00) 12)  Depression  (ICD-311) 13)  Restless Leg Syndrome  (ICD-333.94) 14)  Hypertension  (ICD-401.9) 15)  Hyperlipidemia  (ICD-272.4) 16)  COPD  (ICD-496) 17)  Asthma   (ICD-493.90)  Medications Prior to Update: 1)  None  Current Medications (verified): 1)  Simvastatin 40 Mg Tabs (Simvastatin) .Marland Kitchen.. 1 By Mouth Once Daily 2)  Hydrochlorothiazide 25 Mg Tabs (Hydrochlorothiazide) .Marland Kitchen.. 1 By Mouth Once Daily 3)  Advair Diskus 100-50 Mcg/dose Aepb (Fluticasone-Salmeterol) .Marland Kitchen.. 1 Puff in The Morning 1 Puff At Bedtime 4)  Metformin Hcl 500 Mg Tabs (Metformin Hcl) .Marland Kitchen.. 1po Once Daily 5)  Ketoconazole 200 Mg Tabs (Ketoconazole) .Marland Kitchen.. 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 Drug use-no  Risk Factors: Smoking Status: quit (12-Nov-2009)  Past Medical History: SPINAL STENOSIS, LUMBAR (ICD-724.02) DISC DISEASE, LUMBAR (ICD-722.52) ANXIETY (ICD-300.00) DEPRESSION (ICD-311) RESTLESS LEG SYNDROME (ICD-333.94) HYPERTENSION (ICD-401.9) HYPERLIPIDEMIA (ICD-272.4) COPD (ICD-496)/severe bullous underlying emphysema  ASTHMA (ICD-493.90) neg stress myoview may 2009 Diabetes mellitus, type II Nephrolithiasis, hx of GERD Peptic ulcer disease - 1987 - duodenal ulcer  Past Surgical History: Rotator cuff  repair - 2002 s/p left foot surgury - morton's neuroma Tonsillectomy  Family History: Reviewed history and no changes required. CAD - father died with MI in his 69s mother with heart disease brother with lung cancer  Social History: Reviewed history and no changes required. 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         all otherwise negative per pt -  except for rash to scrotum and inner thighs near the groin  Physical Exam  General:  alert and overweight-appearing.   Head:  normocephalic and atraumatic.   Eyes:  vision grossly intact, pupils equal, and pupils round.   Ears:  R ear normal and L ear normal.   Nose:  no external deformity and no nasal discharge.   Mouth:  no gingival abnormalities and pharynx pink and moist.   Neck:  supple and no masses.   Lungs:  normal respiratory effort, R decreased breath sounds, and L decreased breath sounds.   Heart:  normal rate and regular rhythm.   Abdomen:  soft, non-tender, and normal bowel sounds.   Msk:  no joint tenderness and no joint swelling.   Extremities:  no edema, no erythema  Neurologic:  cranial nerves II-XII intact and strength normal in all extremities.     Impression & Recommendations:  Problem # 1:  Preventive Health Care (ICD-V70.0)  Overall doing well, age appropriate education and counseling updated and referral for appropriate preventive services done unless declined, immunizations up to date or declined, diet counseling done if overweight, urged to quit smoking if smokes , most recent labs reviewed and current ordered if appropriate, ecg reviewed or declined (interpretation per ECG scanned in the EMR if done); information regarding Medicare Prevention requirements given if appropriate, declines colonoscpy  Orders: TLB-BMP (Basic Metabolic Panel-BMET) (80048-METABOL) TLB-CBC Platelet - w/Differential (85025-CBCD) TLB-Hepatic/Liver Function Pnl (80076-HEPATIC) TLB-Lipid  Panel (80061-LIPID) TLB-TSH (Thyroid Stimulating Hormone) (84443-TSH) TLB-Udip ONLY (81003-UDIP) TLB-PSA (Prostate Specific Antigen) (84153-PSA)  Problem # 2:  DIABETES MELLITUS, TYPE II (ICD-250.00)  new onset, to start metformin 500 once daily;  declines dietary, recent a1c 7.7  His updated medication list for this problem includes:    Metformin Hcl 500 Mg Tabs (Metformin hcl) .Marland Kitchen... 1po once daily  Problem # 3:  CHRONIC OBSTRUCTIVE PULMONARY DISEASE, ACUTE EXACERBATION (ICD-491.21)  post hospn, improved, to cont meds, refer for PFT's, refer to dr clance for long term f/u (pt declines to see dr Delford Field or wert further after experience with them duing his wife;s ICU stay)  Orders: Pulmonary Referral (Pulmonary) Misc. Referral (Misc. Ref)  Problem # 4:  RASH-NONVESICULAR (ICD-782.1) to pernieum since last hospn  - for nizoral two times a day x 5 days  Complete Medication List: 1)  Simvastatin 40 Mg Tabs (Simvastatin) .Marland Kitchen.. 1 by mouth once daily 2)  Hydrochlorothiazide 25 Mg Tabs (Hydrochlorothiazide) .Marland Kitchen.. 1 by mouth once daily 3)  Advair Diskus 100-50 Mcg/dose Aepb (Fluticasone-salmeterol) .Marland Kitchen.. 1 puff in the morning 1 puff at bedtime 4)  Metformin Hcl 500 Mg Tabs (Metformin hcl) .Marland Kitchen.. 1po once daily 5)  Ketoconazole 200 Mg Tabs (Ketoconazole) .Marland Kitchen.. 1po two times a day for 5 days  Patient Instructions: 1)  You will be contacted about the referral(s) to: Dr Shelle Iron, and lung testing (PFT's) 2)  Please go to the Lab in the basement for your blood and/or urine tests today  3)  Please take all new medications as prescribed  - the pill for the rash, and the pill for the diabetes 4)  Please schedule a follow-up appointment in 6 months with: 5)  BMP prior to visit, ICD-9: 250.02 6)  Lipid Panel prior to visit, ICD-9: 7)  HbgA1C prior to visit, ICD-9: Prescriptions: KETOCONAZOLE 200 MG TABS (KETOCONAZOLE) 1po two times a day for 5 days  #10 x 0   Entered and Authorized by:   [name]  MD   Signed by:   [name] MD on 11/03/2009   Method used:   Print then Give to Patient   RxID:   [id number] METFORMIN HCL 500 MG TABS (METFORMIN HCL) 1po once daily  #90 x 3   Entered and Authorized by:   [name] MD   Signed by:   [name] MD on 11/03/2009   Method used:   Print then Give to Patient   RxID:   [id number]

## 2010-10-22 ENCOUNTER — Encounter: Payer: Self-pay | Admitting: Internal Medicine

## 2010-11-04 NOTE — Medication Information (Signed)
Summary: Diabetes Supply Order / HDIS  Diabetes Supply Order / HDIS   Imported By: Lennie Odor 10/27/2010 12:03:41  _____________________________________________________________________  External Attachment:    Type:   Image     Comment:   External Document

## 2010-11-10 ENCOUNTER — Telehealth: Payer: Self-pay | Admitting: Internal Medicine

## 2010-11-16 ENCOUNTER — Ambulatory Visit: Payer: Self-pay | Admitting: Internal Medicine

## 2010-11-16 NOTE — Progress Notes (Signed)
  Phone Note Refill Request Message from:  Fax from Pharmacy on November 10, 2010 11:15 AM  Refills Requested: Medication #1:  PRODIGY BLOOD GLUCOSE TEST  STRP test as directed three times a day   Dosage confirmed as above?Dosage Confirmed   Last Refilled: 11/2009   Notes: [facility]  Medication #2:  PRODIGY LANCETS 28G  MISC test as directed three times a day.   Dosage confirmed as above?Dosage Confirmed   Last Refilled: 11/2009   Notes: [facility] Initial call taken by: Robin Ewing CMA (AAMA),  November 10, 2010 11:16 AM    Prescriptions: PRODIGY LANCETS 28G  MISC (LANCETS) test as directed three times a day  #90 x 2   Entered by:   Scharlene Gloss CMA (AAMA)   Authorized by:   [name] MD   Signed by:   Scharlene Gloss CMA (AAMA) on 11/10/2010   Method used:   Faxed to ...       Chief Executive Officer Environmental education officer)       [address]       [place], [place]  [address]       Ph: [phone]       Fax: [phone]   RxID:   [id number] PRODIGY BLOOD GLUCOSE TEST  STRP (GLUCOSE BLOOD) test as directed three times a day  #90 x 2   Entered by:   Scharlene Gloss CMA (AAMA)   Authorized by:   [name] MD   Signed by:   Scharlene Gloss CMA (AAMA) on 11/10/2010   Method used:   Faxed to ...       Chief Executive Officer Environmental education officer)       [address]       [place], [place]  [address]       Ph: [phone]       Fax: [phone]   RxID:   [id number]

## 2010-11-29 LAB — CBC
HCT: 50.1 % (ref 39.0–52.0)
Hemoglobin: 17.4 g/dL — ABNORMAL HIGH (ref 13.0–17.0)
MCH: 31.4 pg (ref 26.0–34.0)
MCH: 30 pg (ref 26.0–34.0)
MCHC: 34.7 g/dL (ref 30.0–36.0)
MCHC: 33.3 g/dL (ref 30.0–36.0)
MCV: 90.3 fL (ref 78.0–100.0)
MCV: 90.3 fL (ref 78.0–100.0)
Platelets: 255 10*3/uL (ref 150–400)

## 2010-11-29 LAB — URINALYSIS, ROUTINE W REFLEX MICROSCOPIC
Glucose, UA: 100 mg/dL — AB
Hgb urine dipstick: NEGATIVE
Specific Gravity, Urine: 1.019 (ref 1.005–1.030)
pH: 6 (ref 5.0–8.0)

## 2010-11-29 LAB — COMPREHENSIVE METABOLIC PANEL
AST: 15 U/L (ref 0–37)
Albumin: 3.1 g/dL — ABNORMAL LOW (ref 3.5–5.2)
BUN: 9 mg/dL (ref 6–23)
CO2: 28 mEq/L (ref 19–32)
Calcium: 8.8 mg/dL (ref 8.4–10.5)
Chloride: 106 mEq/L (ref 96–112)
Creatinine, Ser: 1.02 mg/dL (ref 0.4–1.5)
GFR calc Af Amer: >60 mL/min (ref >60)
GFR calc non Af Amer: >60 mL/min (ref >60)
Total Bilirubin: 0.7 mg/dL (ref 0.3–1.2)

## 2010-11-29 LAB — DIFFERENTIAL
Basophils Absolute: 0 10*3/uL (ref 0.0–0.1)
Basophils Relative: 0 % (ref 0–1)
Eosinophils Absolute: 0.2 10*3/uL (ref 0.0–0.7)
Eosinophils Relative: 2 % (ref 0–5)
Eosinophils Relative: 2 % (ref 0–5)
Lymphocytes Relative: 14 % (ref 12–46)
Lymphs Abs: 1 10*3/uL (ref 0.7–4.0)
Monocytes Absolute: 0.7 10*3/uL (ref 0.1–1.0)
Monocytes Relative: 6 % (ref 3–12)
Neutro Abs: 5.9 10*3/uL (ref 1.7–7.7)
Neutrophils Relative %: 76 % (ref 43–77)

## 2010-11-29 LAB — POCT CARDIAC MARKERS
CKMB, poc: <1 ng/mL — ABNORMAL LOW (ref 1.0–8.0)
Troponin i, poc: <0.05 ng/mL (ref 0.00–0.09)

## 2010-11-29 LAB — GLUCOSE, CAPILLARY: Glucose-Capillary: 79 mg/dL (ref 70–99)

## 2010-11-29 LAB — BASIC METABOLIC PANEL
BUN: 9 mg/dL (ref 6–23)
CO2: 29 mEq/L (ref 19–32)
Calcium: 9.1 mg/dL (ref 8.4–10.5)
Calcium: 9 mg/dL (ref 8.4–10.5)
Chloride: 101 mEq/L (ref 96–112)
Creatinine, Ser: 1 mg/dL (ref 0.4–1.5)
GFR calc non Af Amer: >60 mL/min (ref >60)
Glucose, Bld: 118 mg/dL — ABNORMAL HIGH (ref 70–99)
Glucose, Bld: 97 mg/dL (ref 70–99)
Potassium: 3.7 mEq/L (ref 3.5–5.1)
Sodium: 140 mEq/L (ref 135–145)

## 2010-11-29 LAB — CARDIAC PANEL(CRET KIN+CKTOT+MB+TROPI)
CK, MB: 1 ng/mL (ref 0.3–4.0)
Relative Index: INVALID (ref 0.0–2.5)
Total CK: 35 U/L (ref 7–232)
Troponin I: 0.01 ng/mL (ref 0.00–0.06)

## 2010-11-29 LAB — CK TOTAL AND CKMB (NOT AT ARMC)
CK, MB: 1.3 ng/mL (ref 0.3–4.0)
Relative Index: INVALID (ref 0.0–2.5)
Total CK: 48 U/L (ref 7–232)

## 2010-11-29 LAB — LIPASE, BLOOD: Lipase: 20 U/L (ref 11–59)

## 2010-11-29 LAB — LIPID PANEL
HDL: 39 mg/dL — ABNORMAL LOW (ref >39)
Triglycerides: 120 mg/dL (ref <150)
VLDL: 24 mg/dL (ref 0–40)

## 2010-11-29 LAB — PROTIME-INR
INR: 1.02 (ref 0.00–1.49)
Prothrombin Time: 13.6 seconds (ref 11.6–15.2)

## 2010-11-29 LAB — APTT: aPTT: 30 seconds (ref 24–37)

## 2010-11-29 LAB — RAPID URINE DRUG SCREEN, HOSP PERFORMED
Amphetamines: NOT DETECTED
Barbiturates: NOT DETECTED
Benzodiazepines: NOT DETECTED
Opiates: NOT DETECTED

## 2010-11-29 LAB — MAGNESIUM: Magnesium: 2.1 mg/dL (ref 1.5–2.5)

## 2010-11-29 LAB — TSH: TSH: 0.994 u[IU]/mL (ref 0.350–4.500)

## 2010-11-29 LAB — HEMOGLOBIN A1C: Hgb A1c MFr Bld: 6 % — ABNORMAL HIGH (ref <5.7)

## 2010-11-30 LAB — DIFFERENTIAL
Basophils Absolute: 0 10*3/uL (ref 0.0–0.1)
Basophils Relative: 0 % (ref 0–1)
Eosinophils Relative: 2 % (ref 0–5)
Lymphocytes Relative: 12 % (ref 12–46)
Monocytes Absolute: 0.9 10*3/uL (ref 0.1–1.0)
Neutro Abs: 8.9 10*3/uL — ABNORMAL HIGH (ref 1.7–7.7)

## 2010-11-30 LAB — BASIC METABOLIC PANEL
BUN: 11 mg/dL (ref 6–23)
Calcium: 8.8 mg/dL (ref 8.4–10.5)
Creatinine, Ser: 1.04 mg/dL (ref 0.4–1.5)
GFR calc non Af Amer: >60 mL/min (ref >60)
Glucose, Bld: 93 mg/dL (ref 70–99)
Potassium: 3 mEq/L — ABNORMAL LOW (ref 3.5–5.1)

## 2010-11-30 LAB — POCT CARDIAC MARKERS: CKMB, poc: <1 ng/mL — ABNORMAL LOW (ref 1.0–8.0)

## 2010-11-30 LAB — GLUCOSE, CAPILLARY
Glucose-Capillary: 112 mg/dL — ABNORMAL HIGH (ref 70–99)
Glucose-Capillary: 124 mg/dL — ABNORMAL HIGH (ref 70–99)

## 2010-11-30 LAB — CBC
HCT: 45.4 % (ref 39.0–52.0)
MCHC: 34.6 g/dL (ref 30.0–36.0)
Platelets: 258 10*3/uL (ref 150–400)
RDW: 13.5 % (ref 11.5–15.5)
WBC: 11.5 10*3/uL — ABNORMAL HIGH (ref 4.0–10.5)

## 2010-12-02 LAB — URINE CULTURE
Colony Count: NO GROWTH
Colony Count: 2000
Culture  Setup Time: 201109010927
Culture  Setup Time: 201109041829
Culture: NO GROWTH

## 2010-12-02 LAB — POCT I-STAT, CHEM 8
BUN: 15 mg/dL (ref 6–23)
Calcium, Ion: 1.06 mmol/L — ABNORMAL LOW (ref 1.12–1.32)
Chloride: 106 mEq/L (ref 96–112)
Creatinine, Ser: 0.9 mg/dL (ref 0.4–1.5)
HCT: 47 % (ref 39.0–52.0)
Hemoglobin: 15.6 g/dL (ref 13.0–17.0)
Potassium: 3.5 mEq/L (ref 3.5–5.1)
Potassium: 4.2 mEq/L (ref 3.5–5.1)
Sodium: 140 mEq/L (ref 135–145)
Sodium: 140 mEq/L (ref 135–145)

## 2010-12-02 LAB — RAPID URINE DRUG SCREEN, HOSP PERFORMED
Amphetamines: NOT DETECTED
Tetrahydrocannabinol: NOT DETECTED
Tetrahydrocannabinol: NOT DETECTED

## 2010-12-02 LAB — URINALYSIS, ROUTINE W REFLEX MICROSCOPIC
Glucose, UA: NEGATIVE mg/dL
Glucose, UA: NEGATIVE mg/dL
Hgb urine dipstick: NEGATIVE
Hgb urine dipstick: NEGATIVE
Specific Gravity, Urine: 1.006 (ref 1.005–1.030)
Urobilinogen, UA: 0.2 mg/dL (ref 0.0–1.0)
pH: 7.5 (ref 5.0–8.0)
pH: 7 (ref 5.0–8.0)

## 2010-12-02 LAB — BASIC METABOLIC PANEL
CO2: 26 mEq/L (ref 19–32)
Calcium: 9 mg/dL (ref 8.4–10.5)
Chloride: 103 mEq/L (ref 96–112)
Creatinine, Ser: 0.96 mg/dL (ref 0.4–1.5)
GFR calc Af Amer: >60 mL/min (ref >60)
Glucose, Bld: 104 mg/dL — ABNORMAL HIGH (ref 70–99)
Potassium: 4.1 mEq/L (ref 3.5–5.1)
Sodium: 140 mEq/L (ref 135–145)

## 2010-12-02 LAB — HEPATIC FUNCTION PANEL
ALT: 18 U/L (ref 0–53)
AST: 14 U/L (ref 0–37)
Alkaline Phosphatase: 50 U/L (ref 39–117)
Total Protein: 6.1 g/dL (ref 6.0–8.3)

## 2010-12-02 LAB — GLUCOSE, CAPILLARY
Glucose-Capillary: 105 mg/dL — ABNORMAL HIGH (ref 70–99)
Glucose-Capillary: 119 mg/dL — ABNORMAL HIGH (ref 70–99)
Glucose-Capillary: 121 mg/dL — ABNORMAL HIGH (ref 70–99)
Glucose-Capillary: 121 mg/dL — ABNORMAL HIGH (ref 70–99)
Glucose-Capillary: 77 mg/dL (ref 70–99)

## 2010-12-02 LAB — POCT CARDIAC MARKERS
CKMB, poc: <1 ng/mL — ABNORMAL LOW (ref 1.0–8.0)
Myoglobin, poc: 45.1 ng/mL (ref 12–200)
Myoglobin, poc: 59.7 ng/mL (ref 12–200)
Myoglobin, poc: 63.7 ng/mL (ref 12–200)
Troponin i, poc: <0.05 ng/mL (ref 0.00–0.09)

## 2010-12-02 LAB — CBC
HCT: 43 % (ref 39.0–52.0)
HCT: 42.3 % (ref 39.0–52.0)
HCT: 45.1 % (ref 39.0–52.0)
Hemoglobin: 15.1 g/dL (ref 13.0–17.0)
Hemoglobin: 15.3 g/dL (ref 13.0–17.0)
Hemoglobin: 15.8 g/dL (ref 13.0–17.0)
MCH: 30.7 pg (ref 26.0–34.0)
MCH: 31.7 pg (ref 26.0–34.0)
MCHC: 33.9 g/dL (ref 30.0–36.0)
MCHC: 34.3 g/dL (ref 30.0–36.0)
MCV: 88 fL (ref 78.0–100.0)
MCV: 90.4 fL (ref 78.0–100.0)
MCV: 91 fL (ref 78.0–100.0)
Platelets: 221 10*3/uL (ref 150–400)
Platelets: 239 10*3/uL (ref 150–400)
RBC: 4.77 MIL/uL (ref 4.22–5.81)
RBC: 4.99 MIL/uL (ref 4.22–5.81)
RBC: 5.27 MIL/uL (ref 4.22–5.81)
RDW: 14.2 % (ref 11.5–15.5)
WBC: 9 10*3/uL (ref 4.0–10.5)

## 2010-12-02 LAB — LIPID PANEL
Triglycerides: 201 mg/dL — ABNORMAL HIGH (ref <150)
VLDL: 40 mg/dL (ref 0–40)

## 2010-12-02 LAB — DIFFERENTIAL
Basophils Relative: 1 % (ref 0–1)
Eosinophils Absolute: 0.3 10*3/uL (ref 0.0–0.7)
Eosinophils Absolute: 0.3 10*3/uL (ref 0.0–0.7)
Eosinophils Relative: 4 % (ref 0–5)
Eosinophils Relative: 4 % (ref 0–5)
Lymphocytes Relative: 17 % (ref 12–46)
Lymphs Abs: 1.6 10*3/uL (ref 0.7–4.0)
Lymphs Abs: 1.4 10*3/uL (ref 0.7–4.0)
Monocytes Absolute: 0.5 10*3/uL (ref 0.1–1.0)
Monocytes Relative: 6 % (ref 3–12)
Monocytes Relative: 7 % (ref 3–12)
Monocytes Relative: 7 % (ref 3–12)
Neutro Abs: 6.9 10*3/uL (ref 1.7–7.7)
Neutrophils Relative %: 74 % (ref 43–77)

## 2010-12-02 LAB — COMPREHENSIVE METABOLIC PANEL
Alkaline Phosphatase: 45 U/L (ref 39–117)
BUN: 10 mg/dL (ref 6–23)
Chloride: 110 mEq/L (ref 96–112)
Creatinine, Ser: 0.87 mg/dL (ref 0.4–1.5)
Glucose, Bld: 111 mg/dL — ABNORMAL HIGH (ref 70–99)
Potassium: 3.8 mEq/L (ref 3.5–5.1)
Total Bilirubin: 1 mg/dL (ref 0.3–1.2)
Total Protein: 5.7 g/dL — ABNORMAL LOW (ref 6.0–8.3)

## 2010-12-02 LAB — CK TOTAL AND CKMB (NOT AT ARMC): Total CK: 37 U/L (ref 7–232)

## 2010-12-02 LAB — ETHANOL: Alcohol, Ethyl (B): <5 mg/dL (ref 0–10)

## 2010-12-02 LAB — HEMOGLOBIN A1C
Hgb A1c MFr Bld: 6 % — ABNORMAL HIGH (ref <5.7)
Mean Plasma Glucose: 126 mg/dL — ABNORMAL HIGH (ref <117)

## 2010-12-02 LAB — CARDIAC PANEL(CRET KIN+CKTOT+MB+TROPI): Relative Index: INVALID (ref 0.0–2.5)

## 2010-12-04 LAB — CBC
Platelets: 265 10*3/uL (ref 150–400)
RBC: 5.15 MIL/uL (ref 4.22–5.81)
WBC: 8.3 10*3/uL (ref 4.0–10.5)

## 2010-12-04 LAB — BASIC METABOLIC PANEL
Calcium: 9 mg/dL (ref 8.4–10.5)
Creatinine, Ser: 0.84 mg/dL (ref 0.4–1.5)
GFR calc Af Amer: >60 mL/min (ref >60)

## 2010-12-04 LAB — DIFFERENTIAL
Basophils Relative: 0 % (ref 0–1)
Lymphs Abs: 0.8 10*3/uL (ref 0.7–4.0)
Monocytes Relative: 7 % (ref 3–12)
Neutro Abs: 6.7 10*3/uL (ref 1.7–7.7)
Neutrophils Relative %: 81 % — ABNORMAL HIGH (ref 43–77)

## 2010-12-05 LAB — BASIC METABOLIC PANEL
CO2: 25 mEq/L (ref 19–32)
GFR calc Af Amer: >60 mL/min (ref >60)
GFR calc non Af Amer: >60 mL/min (ref >60)
Glucose, Bld: 143 mg/dL — ABNORMAL HIGH (ref 70–99)
Potassium: 4.1 mEq/L (ref 3.5–5.1)
Sodium: 135 mEq/L (ref 135–145)

## 2010-12-05 LAB — COMPREHENSIVE METABOLIC PANEL
ALT: 31 U/L (ref 0–53)
AST: 22 U/L (ref 0–37)
Albumin: 4.1 g/dL (ref 3.5–5.2)
Alkaline Phosphatase: 80 U/L (ref 39–117)
CO2: 24 mEq/L (ref 19–32)
Chloride: 101 mEq/L (ref 96–112)
GFR calc Af Amer: >60 mL/min (ref >60)
GFR calc non Af Amer: >60 mL/min (ref >60)
Potassium: 4.1 mEq/L (ref 3.5–5.1)
Sodium: 137 mEq/L (ref 135–145)
Total Bilirubin: 0.6 mg/dL (ref 0.3–1.2)

## 2010-12-05 LAB — DIFFERENTIAL
Basophils Absolute: 0 10*3/uL (ref 0.0–0.1)
Eosinophils Relative: 6 % — ABNORMAL HIGH (ref 0–5)
Lymphocytes Relative: 16 % (ref 12–46)
Lymphs Abs: 1.4 10*3/uL (ref 0.7–4.0)
Monocytes Absolute: 0.5 10*3/uL (ref 0.1–1.0)
Monocytes Relative: 6 % (ref 3–12)
Neutro Abs: 6.4 10*3/uL (ref 1.7–7.7)

## 2010-12-05 LAB — URINALYSIS, DIPSTICK ONLY
Ketones, ur: NEGATIVE mg/dL
Leukocytes, UA: NEGATIVE
Nitrite: NEGATIVE
pH: 6 (ref 5.0–8.0)

## 2010-12-05 LAB — URINALYSIS, ROUTINE W REFLEX MICROSCOPIC
Bilirubin Urine: NEGATIVE
Ketones, ur: NEGATIVE mg/dL
Nitrite: NEGATIVE
Specific Gravity, Urine: 1.019 (ref 1.005–1.030)
Urobilinogen, UA: 0.2 mg/dL (ref 0.0–1.0)
pH: 5.5 (ref 5.0–8.0)

## 2010-12-05 LAB — CBC
HCT: 46.7 % (ref 39.0–52.0)
Hemoglobin: 16.5 g/dL (ref 13.0–17.0)
RBC: 5.18 MIL/uL (ref 4.22–5.81)
RDW: 13.6 % (ref 11.5–15.5)

## 2010-12-05 LAB — TROPONIN I: Troponin I: 0.01 ng/mL (ref 0.00–0.06)

## 2010-12-05 LAB — CK TOTAL AND CKMB (NOT AT ARMC): CK, MB: 2.2 ng/mL (ref 0.3–4.0)

## 2010-12-05 LAB — CARDIAC PANEL(CRET KIN+CKTOT+MB+TROPI)
Relative Index: INVALID (ref 0.0–2.5)
Total CK: 65 U/L (ref 7–232)
Total CK: 68 U/L (ref 7–232)

## 2010-12-07 LAB — TSH: TSH: 0.403 u[IU]/mL (ref 0.350–4.500)

## 2010-12-07 LAB — GLUCOSE, CAPILLARY
Glucose-Capillary: 105 mg/dL — ABNORMAL HIGH (ref 70–99)
Glucose-Capillary: 107 mg/dL — ABNORMAL HIGH (ref 70–99)
Glucose-Capillary: 113 mg/dL — ABNORMAL HIGH (ref 70–99)
Glucose-Capillary: 115 mg/dL — ABNORMAL HIGH (ref 70–99)
Glucose-Capillary: 116 mg/dL — ABNORMAL HIGH (ref 70–99)
Glucose-Capillary: 116 mg/dL — ABNORMAL HIGH (ref 70–99)
Glucose-Capillary: 125 mg/dL — ABNORMAL HIGH (ref 70–99)
Glucose-Capillary: 144 mg/dL — ABNORMAL HIGH (ref 70–99)

## 2010-12-07 LAB — DIFFERENTIAL
Basophils Absolute: 0 10*3/uL (ref 0.0–0.1)
Lymphocytes Relative: 18 % (ref 12–46)
Monocytes Absolute: 0.7 10*3/uL (ref 0.1–1.0)
Neutro Abs: 8.8 10*3/uL — ABNORMAL HIGH (ref 1.7–7.7)
Neutrophils Relative %: 75 % (ref 43–77)

## 2010-12-07 LAB — BASIC METABOLIC PANEL
BUN: 16 mg/dL (ref 6–23)
GFR calc non Af Amer: >60 mL/min (ref >60)
Potassium: 3.9 mEq/L (ref 3.5–5.1)
Sodium: 140 mEq/L (ref 135–145)

## 2010-12-07 LAB — CBC
HCT: 44 % (ref 39.0–52.0)
Hemoglobin: 14.9 g/dL (ref 13.0–17.0)
Hemoglobin: 14.9 g/dL (ref 13.0–17.0)
Platelets: 233 10*3/uL (ref 150–400)
Platelets: 270 10*3/uL (ref 150–400)
RDW: 13.7 % (ref 11.5–15.5)
WBC: 8.5 10*3/uL (ref 4.0–10.5)

## 2010-12-07 LAB — POCT I-STAT, CHEM 8
Creatinine, Ser: 0.7 mg/dL (ref 0.4–1.5)
Glucose, Bld: 111 mg/dL — ABNORMAL HIGH (ref 70–99)
Hemoglobin: 15.3 g/dL (ref 13.0–17.0)
TCO2: 28 mmol/L (ref 0–100)

## 2010-12-07 LAB — POCT CARDIAC MARKERS
CKMB, poc: 1.3 ng/mL (ref 1.0–8.0)
Myoglobin, poc: 103 ng/mL (ref 12–200)

## 2010-12-07 LAB — CARDIAC PANEL(CRET KIN+CKTOT+MB+TROPI)
CK, MB: 1.1 ng/mL (ref 0.3–4.0)
Relative Index: INVALID (ref 0.0–2.5)
Troponin I: 0.03 ng/mL (ref 0.00–0.06)

## 2010-12-07 LAB — SEDIMENTATION RATE: Sed Rate: 6 mm/hr (ref 0–16)

## 2010-12-16 ENCOUNTER — Emergency Department (HOSPITAL_COMMUNITY): Payer: Medicare Other

## 2010-12-16 ENCOUNTER — Emergency Department (HOSPITAL_COMMUNITY)
Admission: EM | Admit: 2010-12-16 | Discharge: 2010-12-17 | Disposition: A | Discharge status: Home / Self Care | Payer: Medicare Other | Attending: Emergency Medicine | Admitting: Emergency Medicine

## 2010-12-16 DIAGNOSIS — M545 Low back pain, unspecified: Secondary | ICD-10-CM | POA: Insufficient documentation

## 2010-12-16 DIAGNOSIS — M25559 Pain in unspecified hip: Secondary | ICD-10-CM | POA: Insufficient documentation

## 2010-12-16 DIAGNOSIS — R1110 Vomiting, unspecified: Secondary | ICD-10-CM | POA: Insufficient documentation

## 2010-12-16 DIAGNOSIS — J449 Chronic obstructive pulmonary disease, unspecified: Secondary | ICD-10-CM | POA: Insufficient documentation

## 2010-12-16 DIAGNOSIS — R042 Hemoptysis: Secondary | ICD-10-CM | POA: Insufficient documentation

## 2010-12-16 DIAGNOSIS — Z79899 Other long term (current) drug therapy: Secondary | ICD-10-CM | POA: Insufficient documentation

## 2010-12-16 DIAGNOSIS — E7800 Pure hypercholesterolemia, unspecified: Secondary | ICD-10-CM | POA: Insufficient documentation

## 2010-12-16 DIAGNOSIS — M47817 Spondylosis without myelopathy or radiculopathy, lumbosacral region: Secondary | ICD-10-CM | POA: Insufficient documentation

## 2010-12-16 DIAGNOSIS — R062 Wheezing: Secondary | ICD-10-CM | POA: Insufficient documentation

## 2010-12-16 DIAGNOSIS — J40 Bronchitis, not specified as acute or chronic: Secondary | ICD-10-CM | POA: Insufficient documentation

## 2010-12-16 DIAGNOSIS — E119 Type 2 diabetes mellitus without complications: Secondary | ICD-10-CM | POA: Insufficient documentation

## 2010-12-16 DIAGNOSIS — J4489 Other specified chronic obstructive pulmonary disease: Secondary | ICD-10-CM | POA: Insufficient documentation

## 2010-12-16 LAB — DIFFERENTIAL
Eosinophils Absolute: 0.3 10*3/uL (ref 0.0–0.7)
Lymphocytes Relative: 16 % (ref 12–46)
Lymphs Abs: 1.8 10*3/uL (ref 0.7–4.0)
Monocytes Relative: 7 % (ref 3–12)
Neutro Abs: 8.3 10*3/uL — ABNORMAL HIGH (ref 1.7–7.7)
Neutrophils Relative %: 75 % (ref 43–77)

## 2010-12-16 LAB — CBC
Hemoglobin: 15.9 g/dL (ref 13.0–17.0)
MCV: 89.8 fL (ref 78.0–100.0)
Platelets: 242 10*3/uL (ref 150–400)
RBC: 5.11 MIL/uL (ref 4.22–5.81)
WBC: 11.1 10*3/uL — ABNORMAL HIGH (ref 4.0–10.5)

## 2010-12-17 ENCOUNTER — Emergency Department (HOSPITAL_COMMUNITY): Payer: Medicare Other

## 2010-12-17 LAB — URINALYSIS, ROUTINE W REFLEX MICROSCOPIC
Nitrite: NEGATIVE
Specific Gravity, Urine: 1.02 (ref 1.005–1.030)
Urobilinogen, UA: 0.2 mg/dL (ref 0.0–1.0)
pH: 6 (ref 5.0–8.0)

## 2010-12-17 LAB — BASIC METABOLIC PANEL
CO2: 27 mEq/L (ref 19–32)
Calcium: 9.1 mg/dL (ref 8.4–10.5)
GFR calc Af Amer: >60 mL/min (ref >60)
GFR calc non Af Amer: >60 mL/min (ref >60)
Potassium: 3.6 mEq/L (ref 3.5–5.1)
Sodium: 137 mEq/L (ref 135–145)

## 2010-12-23 ENCOUNTER — Emergency Department (HOSPITAL_COMMUNITY): Payer: Medicare Other

## 2010-12-23 ENCOUNTER — Other Ambulatory Visit (INDEPENDENT_AMBULATORY_CARE_PROVIDER_SITE_OTHER): Payer: Medicare Other

## 2010-12-23 ENCOUNTER — Other Ambulatory Visit (INDEPENDENT_AMBULATORY_CARE_PROVIDER_SITE_OTHER): Payer: Medicare Other | Admitting: Internal Medicine

## 2010-12-23 ENCOUNTER — Other Ambulatory Visit: Payer: Medicare Other

## 2010-12-23 ENCOUNTER — Emergency Department (HOSPITAL_COMMUNITY)
Admission: EM | Admit: 2010-12-23 | Discharge: 2010-12-23 | Disposition: A | Discharge status: Home / Self Care | Payer: Medicare Other | Attending: Emergency Medicine | Admitting: Emergency Medicine

## 2010-12-23 ENCOUNTER — Ambulatory Visit (INDEPENDENT_AMBULATORY_CARE_PROVIDER_SITE_OTHER): Payer: Medicare Other | Admitting: Internal Medicine

## 2010-12-23 ENCOUNTER — Telehealth: Payer: Self-pay

## 2010-12-23 ENCOUNTER — Encounter: Payer: Self-pay | Admitting: Internal Medicine

## 2010-12-23 ENCOUNTER — Ambulatory Visit (INDEPENDENT_AMBULATORY_CARE_PROVIDER_SITE_OTHER)
Admission: RE | Admit: 2010-12-23 | Discharge: 2010-12-23 | Disposition: A | Discharge status: Home / Self Care | Payer: Medicare Other | Source: Ambulatory Visit | Attending: Internal Medicine | Admitting: Internal Medicine

## 2010-12-23 VITALS — BP 118/72 | HR 76 | Temp 98.4°F | Ht 71.0 in | Wt 161.5 lb

## 2010-12-23 DIAGNOSIS — R634 Abnormal weight loss: Secondary | ICD-10-CM

## 2010-12-23 DIAGNOSIS — I10 Essential (primary) hypertension: Secondary | ICD-10-CM | POA: Insufficient documentation

## 2010-12-23 DIAGNOSIS — R42 Dizziness and giddiness: Secondary | ICD-10-CM

## 2010-12-23 DIAGNOSIS — I951 Orthostatic hypotension: Secondary | ICD-10-CM

## 2010-12-23 DIAGNOSIS — R5381 Other malaise: Secondary | ICD-10-CM | POA: Insufficient documentation

## 2010-12-23 DIAGNOSIS — R109 Unspecified abdominal pain: Secondary | ICD-10-CM | POA: Insufficient documentation

## 2010-12-23 DIAGNOSIS — E860 Dehydration: Secondary | ICD-10-CM | POA: Insufficient documentation

## 2010-12-23 DIAGNOSIS — J449 Chronic obstructive pulmonary disease, unspecified: Secondary | ICD-10-CM | POA: Insufficient documentation

## 2010-12-23 DIAGNOSIS — IMO0002 Reserved for concepts with insufficient information to code with codable children: Secondary | ICD-10-CM

## 2010-12-23 DIAGNOSIS — F329 Major depressive disorder, single episode, unspecified: Secondary | ICD-10-CM

## 2010-12-23 DIAGNOSIS — R112 Nausea with vomiting, unspecified: Secondary | ICD-10-CM | POA: Insufficient documentation

## 2010-12-23 DIAGNOSIS — R5383 Other fatigue: Secondary | ICD-10-CM | POA: Insufficient documentation

## 2010-12-23 DIAGNOSIS — F3289 Other specified depressive episodes: Secondary | ICD-10-CM

## 2010-12-23 DIAGNOSIS — M5416 Radiculopathy, lumbar region: Secondary | ICD-10-CM | POA: Insufficient documentation

## 2010-12-23 DIAGNOSIS — K219 Gastro-esophageal reflux disease without esophagitis: Secondary | ICD-10-CM

## 2010-12-23 DIAGNOSIS — E785 Hyperlipidemia, unspecified: Secondary | ICD-10-CM

## 2010-12-23 DIAGNOSIS — J4489 Other specified chronic obstructive pulmonary disease: Secondary | ICD-10-CM | POA: Insufficient documentation

## 2010-12-23 LAB — CBC WITH DIFFERENTIAL/PLATELET
Eosinophils Absolute: 0.2 10*3/uL (ref 0.0–0.7)
MCHC: 34.6 g/dL (ref 30.0–36.0)
MCV: 92 fl (ref 78.0–100.0)
Monocytes Absolute: 0.5 10*3/uL (ref 0.1–1.0)
Neutrophils Relative %: 76.4 % (ref 43.0–77.0)
Platelets: 250 10*3/uL (ref 150.0–400.0)
RDW: 14.3 % (ref 11.5–14.6)

## 2010-12-23 LAB — DIFFERENTIAL
Basophils Absolute: 0.1 10*3/uL (ref 0.0–0.1)
Basophils Relative: 1 % (ref 0–1)
Monocytes Absolute: 0.5 10*3/uL (ref 0.1–1.0)
Neutro Abs: 6 10*3/uL (ref 1.7–7.7)
Neutrophils Relative %: 70 % (ref 43–77)

## 2010-12-23 LAB — BASIC METABOLIC PANEL
GFR: 82.22 mL/min (ref >60.00)
Potassium: 4 mEq/L (ref 3.5–5.1)
Sodium: 143 mEq/L (ref 135–145)

## 2010-12-23 LAB — URINE MICROSCOPIC-ADD ON

## 2010-12-23 LAB — RAPID URINE DRUG SCREEN, HOSP PERFORMED
Benzodiazepines: NOT DETECTED
Cocaine: POSITIVE — AB
Opiates: NOT DETECTED
Tetrahydrocannabinol: NOT DETECTED

## 2010-12-23 LAB — HEPATIC FUNCTION PANEL
AST: 12 U/L (ref 0–37)
Alkaline Phosphatase: 79 U/L (ref 39–117)
Bilirubin, Direct: 0.1 mg/dL (ref 0.0–0.3)
Total Bilirubin: 1.1 mg/dL (ref 0.3–1.2)

## 2010-12-23 LAB — URINALYSIS, ROUTINE W REFLEX MICROSCOPIC
Bilirubin Urine: NEGATIVE
Glucose, UA: NEGATIVE mg/dL
Leukocytes, UA: NEGATIVE
Protein, ur: NEGATIVE mg/dL

## 2010-12-23 LAB — COMPREHENSIVE METABOLIC PANEL
ALT: 14 U/L (ref 0–53)
Calcium: 9.2 mg/dL (ref 8.4–10.5)
Creatinine, Ser: 0.96 mg/dL (ref 0.4–1.5)
Glucose, Bld: 89 mg/dL (ref 70–99)
Sodium: 140 mEq/L (ref 135–145)
Total Protein: 6.8 g/dL (ref 6.0–8.3)

## 2010-12-23 LAB — CK TOTAL AND CKMB (NOT AT ARMC)
Relative Index: INVALID (ref 0.0–2.5)
Total CK: 31 U/L (ref 7–232)

## 2010-12-23 LAB — CBC
Hemoglobin: 16 g/dL (ref 13.0–17.0)
MCHC: 33.4 g/dL (ref 30.0–36.0)
RBC: 5.26 MIL/uL (ref 4.22–5.81)

## 2010-12-23 LAB — TROPONIN I: Troponin I: 0.01 ng/mL (ref 0.00–0.06)

## 2010-12-23 MED ORDER — PROMETHAZINE HCL 25 MG PO TABS: 25.0000 mg | ORAL_TABLET | Freq: Four times a day (QID) | ORAL | Status: AC | PRN

## 2010-12-23 MED ORDER — OXYCODONE-ACETAMINOPHEN 5-325 MG PO TABS: 1.0000 | ORAL_TABLET | Freq: Four times a day (QID) | ORAL | Status: AC | PRN

## 2010-12-23 MED ORDER — CITALOPRAM HYDROBROMIDE 10 MG PO TABS: 10.0000 mg | ORAL_TABLET | Freq: Every day | ORAL | Status: DC

## 2010-12-23 MED ORDER — OMEPRAZOLE 20 MG PO CPDR: 20.0000 mg | DELAYED_RELEASE_CAPSULE | Freq: Every day | ORAL | Status: DC

## 2010-12-23 NOTE — Progress Notes (Signed)
  Subjective:    [name], male    DOB: October 11, 1933, 77 y.o.   MRN: [id number]  HPI    Review of Systems     Objective:   Physical Exam        Assessment & Plan:

## 2010-12-23 NOTE — Telephone Encounter (Signed)
Patient called from parking lot stating he felt weak, difficulty walking, weight loss in the past week. Patient is alone, was informed to go to the ER, he refused. Informed could schedule the patient with Dr. Jonny Ruiz, patient agreed to come.

## 2010-12-23 NOTE — Assessment & Plan Note (Signed)
Likely related to above, ECG reviewed  -  no new changes

## 2010-12-23 NOTE — Patient Instructions (Signed)
Stop the simvastatin Take all new medications as prescribed - the pain medication, and the nausea medication, and the citalopram for depression Please drink as much fluids as you can over the next 3 days Please go to the ER if no better, or getting worse over the weekend Please return in 3 days for followup appt  Please go to LAB in the Basement for the blood and/or urine tests to be done today - stat Please go to XRAY in the Basement for the x-ray test Please call the number on the Blue Card (the [employer] System) for results of testing in 2-3 days You will be contacted regarding the referral for: CT scan hopefully for today, as well as the MRI for the back at a later date, and GI referral

## 2010-12-23 NOTE — Progress Notes (Signed)
Quick Note:  Voice message left on [employer] system - lab is negative, normal or otherwise stable, pt to continue same tx ______ 

## 2010-12-23 NOTE — Assessment & Plan Note (Signed)
With weakness c/w mild volume depletion, in the setting of recent abd pain, vomiting, wt loss, and depression/?FTT type picture;  Pt is adamant he is not going to ER again, can take "OK" po,  Will tx with phenergan and ask pt to push fluids over the weekend, or go to ER if worsens

## 2010-12-23 NOTE — Telephone Encounter (Signed)
[facility] Triage Call Report Triage Record Num: [id number] Operator: Audelia Hives Patient Name: [name] [name] Call Date & Time: 12/22/2010 8:54:19PM Patient Phone: [phone] PCP: [name] Patient Gender: Male PCP Fax : [phone] Patient DOB: 1934/08/12 [facility] Name[facility] Reason for Call: Merlyn Albert calling regarding weakness, staggering, weight loss, vomiting, onset 12/15/10. Has lost 48 lbs in the last 8 months. Has vomited 2 times 12/22/10. Pt states he has alot of mucus in mouth. Was seen in the ED 3/39/12 and was spitting up blood, states he was told a blood vessell in throat had "bursted". Last BS was at 1500 96. Emergent s/s for Diabetes -Gastrointestional Problems r/o per protocol except for see in 24 hours due to vomiting more than 1 time in 6 hours, pt to call office in am for appt in 24 hours. Protocol(s) Used: Diabetes: Gastrointestinal Problems Protocol(s) Used: Nausea or Vomiting Recommended Outcome per Protocol: See Provider within 24 hours Reason for Outcome: Known diabetic Vomiting more than 1 time or diarrhea for more than 6 hours Care Advice: ~ Test your blood sugar before driving. Do not drive if blood sugar 70 mg/dl or less. ~ SYMPTOM / CONDITION MANAGEMENT ~ List, or take, all current prescription(s), nonprescription or alternative medication(s) to provider for evaluation. Vomiting Care Advice: - Do not eat solid foods until vomiting subsides. - Begin taking fluids by sucking on ice chips or popsicles or taking sips of cool clear, nonprescription oral rehydration solution). - Gradually drink larger amounts of these fluids so that you are drinking six to eight 8 oz. (.2 liter) of fluids a day. - Keep activity to a minimum. - After vomiting subsides, eat smaller, more frequent meals of easily digested foods such as crackers, toast, bananas, rice, cooked cereal, applesauce, broth, baked or mashed potatoes, chicken or [place] without skin. Eat slowly. - Take  fluids 30 minutes before or 60 minutes after meals. - Avoid high fat, highly seasoned, high fiber or high sugar content foods. - Avoid extremely hot or cold foods. - Do not take pain medication (such as aspirin, NSAIDs) while nauseated or vomiting. - Consult your provider for advice regarding continuing prescription medication. - Rest as much as possible in a sitting or in a propped lying position. Do not lie flat for at least 2 hours after eating. ~ Diabetes Action Plan: - Follow recommended action plan and diet - Check blood sugar as recommended - Take diabetic pills or insulin as ordered - Follow action plan for illness. ~ 12/22/2010 9:13:15PM Page 1 of 1 CAN_[email or web address]_[email or web address]

## 2010-12-23 NOTE — Assessment & Plan Note (Addendum)
Mid abd, with recurrent vomiting, wt loss as above - for GI referral, likely needs EGD, has hx of PUD, and diff includes pancreatitis (though denies significant ETOH), or even PSBO  - for labs and CT abd/pelvis with CM if cr ok;  Doubt renal stone with his hx today, to start PPI as well

## 2010-12-23 NOTE — Progress Notes (Signed)
Subjective:    [name], male    DOB: Aug 10, 1934, 77 y.o.   MRN: [id number]  HPI  Here with 2 wk onset nausea, vomiting and abd pain, with wt loss (pt states 40 lbs ; chart show 31 lbs wt loss since dec 2011;  Pt states most of it recently); denies dysphagia, fever, but does have some back and forth hard and loose stools for months, as well as one episode blood in BM about 2 mo ago;  Also with episode hematemesis several times in just over an our, was seen in ER one wk today - did not go back to ER since then since "they didn't do nothing for me, just sent me back home."  Now with dizziness, weakness, nausea  With last vomiting yest afternoon.  Has had poor intake since yest afternoon though he was able to get one [facility] to stay down.  Abd pain is mid abd, non radiating, no fever, chills, and not worse to eat, just "seems like it is rolling at nights".  Drinks a beer "once in a while", last beer yesterday.  Takes no nsaids, takes tylenol prn for headache   Also with left lower back pain for 2 months, mod to severe ongoing, tylenol no help,  Has occasional LLE radiation with numbness to below the left knee but no weakness, no gait  Change, falls, fever.    Pt denies chest pain, increased sob or doe, wheezing, orthopnea, PND, increased LE swelling, palpitations, dizziness or syncope. Pt denies new neurological symptoms such as new headache, or facial or extremity weakness or numbness except for the above.   Pt denies polydipsia, polyuria, or low sugar symptoms such as weakness or confusion improved with po intake.  Pt states overall good compliance with meds, trying to follow better diet.   Past Medical History  Diagnosis Date  . DIABETES MELLITUS, TYPE II 11/03/2009  . HYPERLIPIDEMIA 11/03/2009  . ANXIETY 11/03/2009  . DEPRESSION 11/03/2009  . RESTLESS LEG SYNDROME 11/03/2009  . HYPERTENSION 11/03/2009  . CHRONIC OBSTRUCTIVE PULMONARY DISEASE, ACUTE EXACERBATION 11/03/2009  .  EMPHYSEMA, BULLOUS 11/03/2009  . ASTHMA 11/03/2009  . COPD 11/03/2009  . GERD 11/03/2009  . PEPTIC ULCER DISEASE 11/03/2009  . ABSCESS, FINGER 04/07/2010  . ABSCESS 12/03/2009  . DISC DISEASE, LUMBAR 11/03/2009  . SPINAL STENOSIS, LUMBAR 11/03/2009  . RASH-NONVESICULAR 11/03/2009  . NEPHROLITHIASIS, HX OF 11/03/2009   No past surgical history on file.  reports that he has been smoking.  He does not have any smokeless tobacco history on file. His alcohol and drug histories not on file. family history is not on file. No Known Allergies  No current outpatient prescriptions on file prior to visit.   Review of Systems Review of Systems  Constitutional: Negative for diaphoresis, activity change, appetite change and unexpected weight change.  HENT: Negative for hearing loss, ear pain, facial swelling, mouth sores and neck stiffness.   Eyes: Negative for pain, redness and visual disturbance.  Respiratory: Negative for shortness of breath and wheezing.   Cardiovascular: Negative for chest pain and palpitations.  Gastrointestinal: Negative for diarrhea, blood in stool, abdominal distention and rectal pain.  Genitourinary: Negative for hematuria, flank pain and decreased urine volume.  Musculoskeletal: Negative for myalgias and joint swelling.  Skin: Negative for color change and wound.  Neurological: Negative for syncope and numbness.  Hematological: Negative for adenopathy.  Psychiatric/Behavioral: Negative for hallucinations, self-injury, decreased concentration and agitation.      Objective:  Physical Exam BP 118/72  Pulse 76  Temp(Src) 98.4 F (36.9 C) (Oral)  Ht 5\' 11"  (1.803 m)  Wt 161 lb 8 oz (73.256 kg)  BMI 22.52 kg/m2  SpO2 96% Physical Exam  VS noted Constitutional: Pt is oriented to person, place, and time. Appears well-developed but thin, fatigued , weak appearing.  HENT:  Head: Normocephalic and atraumatic.  Right Ear: External ear normal.  Left Ear: External ear normal.    Nose: Nose normal.  Mouth/Throat: Oropharynx is clear and dry Eyes: Conjunctivae and EOM are normal. Pupils are equal, round, and reactive to light.  Neck: Normal range of motion. Neck supple. No JVD present. No tracheal deviation present.  Cardiovascular: Normal rate, regular rhythm, normal heart sounds and intact distal pulses.   Pulmonary/Chest: Effort normal and breath sounds normal.  Abdominal: Soft. Bowel sounds are normal. There is mod tenderness to mid abd without guarding or rebound  Musculoskeletal: Normal range of motion. Exhibits no edema.  Lymphadenopathy:  Has no cervical adenopathy.  Neurological: Pt is alert and oriented to person, place, and time. Pt has normal reflexes. No cranial nerve deficit. Motor intact throughout  Spine nontender, left lumbar paravertebral tender noted Skin: Skin is warm and dry. No rash noted.  Psychiatric:  Has  depressed mood and affect. Behavior is otherwise normal without agigation or other        Assessment & Plan:

## 2010-12-23 NOTE — Assessment & Plan Note (Signed)
New onset x 2 mo per pt, with mod to severe pain in the setting of depression worsening - for pain med and MRI at a later date

## 2010-12-23 NOTE — Progress Notes (Signed)
  Subjective:    [name], male    DOB: 1934/09/06, 77 y.o.   MRN: [id number]  HPI  Addendum:  Pt was helped by staff member to get labs drawn and have CXR dong (results pending), but pt began weeping on return to office to get instructions for CT later today;  Has no family or other to help him at this time;  After further discussion, he now assents to being referred to the ED, as he likely needs IVF's and CT , and probable admit.  Will cancel CT ordered here.  Also pt adamant he will walk across the street to the ER, will not allow ambulance to be called.  As pt is competant, will defer to his wishes.    Review of Systems     Objective:   Physical Exam        Assessment & Plan:

## 2010-12-23 NOTE — Assessment & Plan Note (Signed)
?   Related to depression - for cxr today

## 2010-12-23 NOTE — Assessment & Plan Note (Signed)
Marked worsening for several months, nonsuicidal - for citalpram 10 qd

## 2010-12-23 NOTE — Assessment & Plan Note (Signed)
Likely etiology of orthostasis, discussion as per orthostasis

## 2010-12-23 NOTE — Assessment & Plan Note (Signed)
To hold statin for now, given possibilty of pancreatitis or constipation

## 2010-12-27 ENCOUNTER — Ambulatory Visit: Payer: Medicare Other | Admitting: Internal Medicine

## 2010-12-27 LAB — URINALYSIS, ROUTINE W REFLEX MICROSCOPIC
Hgb urine dipstick: NEGATIVE
Nitrite: NEGATIVE
Protein, ur: NEGATIVE mg/dL
Specific Gravity, Urine: 1.022 (ref 1.005–1.030)
Urobilinogen, UA: 1 mg/dL (ref 0.0–1.0)

## 2010-12-27 LAB — DIFFERENTIAL
Basophils Absolute: 0.1 10*3/uL (ref 0.0–0.1)
Eosinophils Relative: 5 % (ref 0–5)
Lymphocytes Relative: 24 % (ref 12–46)
Lymphs Abs: 2.1 10*3/uL (ref 0.7–4.0)
Neutro Abs: 5.6 10*3/uL (ref 1.7–7.7)
Neutrophils Relative %: 62 % (ref 43–77)

## 2010-12-27 LAB — D-DIMER, QUANTITATIVE: D-Dimer, Quant: 0.38 ug/mL-FEU (ref 0.00–0.48)

## 2010-12-27 LAB — POCT CARDIAC MARKERS
CKMB, poc: 1 ng/mL (ref 1.0–8.0)
CKMB, poc: <1 ng/mL — ABNORMAL LOW (ref 1.0–8.0)
Troponin i, poc: <0.05 ng/mL (ref 0.00–0.09)

## 2010-12-27 LAB — CBC
Platelets: 268 10*3/uL (ref 150–400)
RDW: 13 % (ref 11.5–15.5)
WBC: 8.9 10*3/uL (ref 4.0–10.5)

## 2010-12-27 LAB — BRAIN NATRIURETIC PEPTIDE: Pro B Natriuretic peptide (BNP): <30 pg/mL (ref 0.0–100.0)

## 2010-12-30 ENCOUNTER — Telehealth: Payer: Self-pay | Admitting: Internal Medicine

## 2010-12-30 LAB — BASIC METABOLIC PANEL
CO2: 27 mEq/L (ref 19–32)
Calcium: 9.3 mg/dL (ref 8.4–10.5)
GFR calc Af Amer: >60 mL/min (ref >60)
GFR calc non Af Amer: >60 mL/min (ref >60)
Glucose, Bld: 112 mg/dL — ABNORMAL HIGH (ref 70–99)
Potassium: 3.5 mEq/L (ref 3.5–5.1)
Sodium: 143 mEq/L (ref 135–145)

## 2010-12-30 LAB — DIFFERENTIAL
Eosinophils Relative: 3 % (ref 0–5)
Lymphocytes Relative: 19 % (ref 12–46)
Monocytes Absolute: 0.5 10*3/uL (ref 0.1–1.0)
Monocytes Relative: 6 % (ref 3–12)
Neutro Abs: 6.2 10*3/uL (ref 1.7–7.7)

## 2010-12-30 LAB — CBC
HCT: 47.1 % (ref 39.0–52.0)
Hemoglobin: 16.1 g/dL (ref 13.0–17.0)
MCHC: 34.3 g/dL (ref 30.0–36.0)
RBC: 5.24 MIL/uL (ref 4.22–5.81)

## 2010-12-30 LAB — POCT CARDIAC MARKERS
CKMB, poc: <1 ng/mL — ABNORMAL LOW (ref 1.0–8.0)
Troponin i, poc: <0.05 ng/mL (ref 0.00–0.09)

## 2010-12-31 ENCOUNTER — Inpatient Hospital Stay (HOSPITAL_COMMUNITY): Admission: RE | Admit: 2010-12-31 | Payer: Medicare Other | Source: Ambulatory Visit

## 2010-12-31 NOTE — Telephone Encounter (Signed)
Left a message for patient to call back. 

## 2011-01-03 ENCOUNTER — Ambulatory Visit: Payer: Medicare Other | Admitting: Gastroenterology

## 2011-01-03 NOTE — Telephone Encounter (Signed)
Spoke with patient and he wants to cancel his appointment. States "I have a hernia and I need to see a Careers adviser. This will be money wasted."

## 2011-01-04 ENCOUNTER — Ambulatory Visit (HOSPITAL_COMMUNITY): Admission: RE | Admit: 2011-01-04 | Payer: Medicare Other | Source: Ambulatory Visit

## 2011-01-17 ENCOUNTER — Telehealth: Payer: Self-pay

## 2011-01-17 NOTE — Telephone Encounter (Signed)
[facility] Triage Call Report Triage Record Num: [id number] Operator: Valene Bors Patient Name: [name] [name] Call Date & Time: 01/15/2011 4:03:35PM Patient Phone: [phone] PCP: [name] Patient Gender: Male PCP Fax : [phone] Patient DOB: 1934/06/12 [facility] Name[facility] Reason for Call: [name] is calling about having pain in hip where he has arthritis and he would like refill of Oxycodone-01/15/11. He is able to walk but has trouble getting up from sitting. OTC pain relievers not helping. Pain radiates down leg. Care advice per Hip Non-Injury Protocol and advised that he call office on 01/17/11. Protocol(s) Used: Hip Non-Injury Recommended Outcome per Protocol: See Provider within 72 Hours Reason for Outcome: Gradual onset (weeks to months) of episodes of pain shooting down any extremity Care Advice: ~ Call provider if symptoms worsen or new symptoms develop. ~ Avoid activity that causes or worsens symptoms. ~ SYMPTOM / CONDITION MANAGEMENT ~ CAUTIONS Analgesic/Antipyretic Advice - Acetaminophen: Consider acetaminophen as directed on label or by pharmacist/provider for pain or fever PRECAUTIONS: - Use if there is no history of liver disease, alcoholism, or intake of three or more alcohol drinks per day - Only if approved by provider during pregnancy or when breastfeeding - During pregnancy, acetaminophen should not be taken more than 3 consecutive days without telling provider - Do not exceed recommended dose or frequency ~ Analgesic/Antipyretic Advice - NSAIDs: Consider aspirin, ibuprofen, naproxen or ketoprofen for pain or fever as directed on label or by pharmacist/provider. PRECAUTIONS: - If over 30 years of age, should not take longer than 1 week without consulting provider. EXCEPTIONS: - Should not be used if taking blood thinners or have bleeding problems. - Do not use if have history of sensitivity/allergy to any of these medications; or history of  cardiovascular, ulcer, kidney, liver disease or diabetes unless approved by provider. - Do not exceed recommended dose or frequency. ~ 01/15/2011 4:33:19PM Page 1 of 1 CAN_TriageRpt_V2

## 2011-01-19 ENCOUNTER — Telehealth: Payer: Self-pay

## 2011-01-19 MED ORDER — OXYCODONE-ACETAMINOPHEN 5-325 MG PO TABS: 1.0000 | ORAL_TABLET | Freq: Four times a day (QID) | ORAL | Status: AC | PRN

## 2011-01-19 NOTE — Telephone Encounter (Signed)
Pt informed, Rx in cabinet for pt pick up. Pt also states he has upcoming appt already scheduled

## 2011-01-19 NOTE — Telephone Encounter (Signed)
Pt called requesting refill of pain meds prescribed at last OV ,Percocet 5-325 #50 x 0 1 tab po q6h prn pain. Please advise.

## 2011-01-19 NOTE — Telephone Encounter (Signed)
Done hardcopy to dahlia/LIM B  Needs ROV for further refills

## 2011-02-01 NOTE — H&P (Signed)
[name] [name], [name] [name]                 ACCOUNT NO.:  [id number]   MEDICAL RECORD NO.:  [id number]          PATIENT TYPE:  OBV   LOCATION:  4713                         FACILITY:  [facility]   PHYSICIAN:  Everardo Beals. Juanda Chance, MD, FACCDATE OF BIRTH:  Dec 06, 1933   DATE OF ADMISSION:  01/21/2008  DATE OF DISCHARGE:                              HISTORY & PHYSICAL   PRIMARY CARE PHYSICIAN:  Dr. Pearson Grippe.   REFERRING PHYSICIAN:  [facility], D Team.   REASON FOR REFERRAL:  Evaluation of chest pain.   CLINICAL HISTORY:  [name] [name] is 77 years old and has no prior history  of known heart disease.  Over the last 3 days, he has had chest pain,  which he describes as a sharp chest pain in the middle of his chest,  which comes and leaves quickly and is not related to exertion.  His wife  is a patient in the hospital and he was visiting her and had an episode  of pain.  The nurses witnessed this and said he did not look good and  sent him to the emergency department and he was admitted.  He does have  shortness of breath with exertion, but this is chronic and he does not  relate it to his chronic lung disease.  He has had occasional  palpitations.  He has had no exertionally related chest pain.   PAST MEDICAL HISTORY:  Significant for hypertension and chronic  obstructive pulmonary disease.   CURRENT MEDICATIONS:  Include:  1. Aspirin 325 mg.  2. Nitroglycerin 1 inch TD.  3. Lovenox 40 mg subcu daily.  4. Metoprolol 25 mg b.i.d.  5. Lisinopril 5 mg daily.  6. Hydrochlorothiazide 12.5 mg daily.  7. Simvastatin 40 mg daily.  8. Protonix.   FAMILY HISTORY:  Positive for heart disease in that he had a father who  died of a heart attack in his 46s.   SOCIAL HISTORY:  He works as a Electrical engineer.  He lives with his wife  and has two children.  He quit smoking in 1993.   REVIEW OF SYSTEMS:  Positive for shortness of breath.   PHYSICAL EXAMINATION:  VITAL SIGNS:  The blood pressure is 120/70 and  pulse  85 and regular.  NECK:  There was no venous distention.  The carotid pulses were full  without bruits.  CHEST:  Clear without wheezes or rales, but there were decreased breath  sounds.  CARDIAC:  The cardiac rhythm was regular.  I could hear no murmurs or  gallops.  The heart sounds were decreased.  ABDOMEN:  Soft with normal bowel sounds.  There is no  hepatosplenomegaly.  EXTREMITIES:  Peripheral pulses are full.  There was no peripheral  edema.   Electrocardiogram was normal except for a prolonged QT.   IMPRESSION:  1. Chest pain, questionable acute coronary syndrome.  2. Hypertension.  3. Chronic obstructive pulmonary disease.  4. Positive family history for coronary heart disease.   RECOMMENDATIONS:  I agree with the current treatment with aspirin, beta  blocker, and nitrates.  I also agree with  plans to evaluate him with  noninvasive evaluation with a stress test and echocardiogram.  I talked  to him about various options for evaluation and he favors this approach.  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  If  this shows no evidence of ischemia and his markers are negative, I think  we can look for noncardiac causes of his chest pain.      Bruce Elvera Lennox Juanda Chance, MD, [facility]  Electronically Signed     BRB/MEDQ  D:  01/22/2008  T:  01/23/2008  Job:  [id number]

## 2011-02-01 NOTE — Discharge Summary (Signed)
[name], [name] NO.:  [id number]   MEDICAL RECORD NO.:  [id number]          PATIENT TYPE:  INP   LOCATION:  4713                         FACILITY:  [facility]   PHYSICIAN:  Lucita Ferrara, MD         DATE OF BIRTH:  [birth year]   DATE OF ADMISSION:  01/21/2008  DATE OF DISCHARGE:                               DISCHARGE SUMMARY   ADDENDUM:  I evaluated and examined the patient today, Jan 24, 2008.  The  patient exhibited signs and symptoms of anhedonia, depression, and the  patient had active suicidal ideations.  The patient had no specific plan  but states that he has owned a gun in the past.  The patient's symptoms  revolved around current situation with the patient's wife being ill,  having to take care of her.  The patient seems very down and depressed  and was actively crying.   PHYSICAL EXAMINATION:  VITAL SIGNS:  Temperature 97.8, pulse 84,  respirations 20, blood pressure 115/64, pulse ox is 92% on room air.  HEENT:  Normocephalic, atraumatic.  Sclerae anicteric.  PERRLA.  Extraocular muscles intact.  NECK:  Supple.  No JVD, no carotid bruits.  CARDIOVASCULAR:  S1-S2.  Regular rate and rhythm.  No murmurs, rubs or  clicks.  ABDOMEN:  Soft, nontender, nondistended, positive bowel sounds.  LUNGS:  Clear to auscultation bilaterally.  No rhonchi, rales or  wheezes.  NEUROLOGIC:  The patient is alert and oriented x3.  Cranial nerves II-  XII grossly intact.  PSYCHIATRIC:  The patient is actively crying.  Affect is down.   Laboratory data and workup for acute coronary syndrome negative.  The  patient had a full cardiac panel x3 negative.  TSH negative.  Homocysteine level 7.5, within normal limits.  Lipid panel with an LDL  of 53.   ASSESSMENT AND PLAN:  1. The patient presented with chest pain which was found to be      noncardiac.  The patient is status post Myoview and 2-D      echocardiogram within normal limits.  The patient status post      cardiology  evaluation.  2. The patient had active suicidal ideations.  The patient is status      post psychiatric consultation who recommended to discontinue the      sitter and also recommended to let the patient go home.  It was      thought that the patient has no active suicidal risk.  There was no      specific recommendations made as far as antidepressants go.   DISCHARGE MEDICATIONS:  His discharge medications remain the same.   All instructions given to the patient.      Lucita Ferrara, MD  Electronically Signed     RR/MEDQ  D:  01/24/2008  T:  01/24/2008  Job:  [id number]

## 2011-02-01 NOTE — Consult Note (Signed)
NAMELEOPOLDO, MAZZIE NO.:  [id number]   MEDICAL RECORD NO.:  [id number]          PATIENT TYPE:  INP   LOCATION:  4713                         FACILITY:  [facility]   PHYSICIAN:  Anselm Jungling, MD  DATE OF BIRTH:  1934-04-10   DATE OF CONSULTATION:  01/24/2008  DATE OF DISCHARGE:                                 CONSULTATION   IDENTIFYING DATA AND REASON FOR REFERRAL:  The patient is a 77 year old  married Caucasian male who is currently under the care of the [facility]  D team here at [facility].  Psychiatric consultation is  requested because of concern about suicidal ideation.   HISTORY OF THE PRESENTING PROBLEMS:  The patient was admitted on Jan 21, 2008, with chest pain, that has been found to be based upon ischemic  pathology.  He has a history of hypertension, COPD, hyperlipidemia.   The patient apparently has told his wife of some suicidal ideation, and  made reference to a gun, which has led to great concern, and the patient  has Been placed on one-to-one observation and this consultation has been  requested, by Dr. Flonnie Overman.   Most of the following history is provided by the patient himself who  appears to be a reasonably good historian.  He explains that he was  having some suicidal thoughts several weeks ago, but states that he is  not having such thoughts currently.  He states that the only person he  mentioned these thoughts to was his wife.  He states that he has been  down, because of a lot of problems.  When asked to describe these, he  begins with his own health.  He states that sometime in the recent past  he was told by his family physician that his blood pressure was too  high, and that his lipids were too high.  The doctor recommended that he  walked 30-45 minutes every day.  The patient states, I decided if I was  going to walk that much, I wanted to get paid for it.  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  He states that with this, his blood  pressure has normalized, his cholesterol is normalized, and he has lost  a little weight.  Nonetheless, he was hospitalized a few days ago for  chest pain.  Today, he states he feels fine, just a little weak.  He  states that he feels ready to go home.   He goes on to explain, however, that his wife has had more severe health  problems.  In fact, she is currently hospitalized here at [facility] as  well, one floor below [place].  She is being treated for complications of hip  fracture that occurred in February, and in addition, she has diabetes  mellitus, Parkinson's disease, and apparently by his description, some  degree of dementia and disorientation.  He indicates that  he expects  that she will be going to a nursing home.  He has been working with the  Child psychotherapist here towards getting his wife to a specific nursing home  that he knows of that he has had very good reports about.   He goes on to describe other stressors, including the numerous medical  bills that he has been receiving over the past months, as a result of  both his and his wife's medical conditions.  He states that he received  a bill for over $35,000 for a 2-week stay for his wife that occurred  following her hip injury.  The way he described it to me, he gave me the  exact amount of the hospital bill, down to the penny, over $35,000.  He  notes that he is responsible for approximately $1,700 of that.  Aside  from this, he continues to receive numerous other bills from various  doctors that I have never even heard of.  He feels that some of these  are exorbitant, and he doubts whether some of his services were actually  provided, or whether the services, if provided warranted charges of $400-  $500 per visit, which he alleges what is discharged.  He feels very down   about this.   The patient states that he has two adult children.  The older one, a  son, lives locally and is a help to both he and his wife.   MENTAL STATUS AND OBSERVATIONS:  The patient is a moderately obese,  normally-developed adult male who is sitting in the lounge chair next to  his bed.  He is wearing a hospital gown over jeans and tennis shoes.  He  is alert, fully oriented, and very pleasant and friendly, although  somewhat sad.  His thoughts and speech are normally organized.  There is  nothing to suggest any underlying thought disorder, cognitive or memory  impairment whatsoever.  He appears actually to be cognitively quite  sharp, and I could detect no memory deficits at all, immediate, recent,  remote.   He appears to have a good level of insight and judgment.  We discussed  the concern about suicide specifically.  He had not been aware that the  one-to-one sitter was there for that purpose.  He reassures me that he  was having those thought several weeks ago, but that he is not now.  He  acknowledges that he made some of those statements to his wife, but  states that he has not made any of those statements here in the hospital  or to anyone else.  He states, no, I am not going to kill myself.  With respect to his previous statements about a gun, he states that he  no longer has a gun at home.   All-in-all, Mr. Heinkel was friendly, pleasant, congenial, and although  sad, showed a good sense of humor and smiled appropriately many times  during our conversation.  I enjoyed speaking with them.   IMPRESSION:  The patient appears to be experiencing signs and symptoms  of an adjustment disorder with depressed mood.  He states that the  suicidal thoughts that he was having were several weeks ago, but not  applying currently.  He seems fairly convincing in his statements  regarding this.  I would feel comfortable with the one-to-one sitter  being discontinued.  However, I  would like to be contacted again prior  to the patient's discharge, just to check in with  him and reassess his  mood and his level of future orientation, hopefulness, etc.   Also, if I speak with him again, there may raise the possibility of a  trial of antidepressant medication for him, which I did not bring up  with him today.   The patient does not appear to have any prior history of depression or  psychiatric disturbance, so I see his current situation is reflecting an  adjustment disorder only, and not an episode of major depression.   DIAGNOSTIC IMPRESSION:  AXIS I:  Adjustment disorder with disturbance of  emotions, depressed mood.  AXIS II:  Deferred.  AXIS III:  Multiple medical problems.  AXIS IV:  Stressors severe.  AXIS V:  GAF 65.   RECOMMENDATIONS:  I agree with his current management.  I feel  comfortable with the sitter being discontinued.  Please contact me at  [phone] as the patient's discharge date is approaching, as I would like  to reassess him at that time.      Anselm Jungling, MD  Electronically Signed     SPB/MEDQ  D:  01/24/2008  T:  01/24/2008  Job:  [id number]

## 2011-02-01 NOTE — Discharge Summary (Signed)
[name], [name] NO.:  [id number]   MEDICAL RECORD NO.:  [id number]          PATIENT TYPE:  OBV   LOCATION:  4713                         FACILITY:  [facility]   PHYSICIAN:  Lucita Ferrara, MD         DATE OF BIRTH:  13-Mar-1934   DATE OF ADMISSION:  01/21/2008  DATE OF DISCHARGE:  01/23/2008                               DISCHARGE SUMMARY   DISCHARGE DIAGNOSES:  1. Chest pain, proven to be nonischemic in etiology.  2. Anxiety.  3. Hypertension.  4. Hyperlipidemia.  5. Family history of coronary artery disease.   CONSULTANTS:  Everardo Beals Juanda Chance, MD, [facility].   PROCEDURES:  The patient had a 2-D echocardiogram, which showed a normal  ejection fraction of 60%, no diastolic dysfunction on Jan 22, 2008.  The  patient also had a Myoview dated Jan 23, 2008, which showed:  normal  results, no ischemic changes, no evidence of infarction or ischemia,  normal wall motion and myocardial thickening with contractions, with  estimated ejection fraction 76%, mild diaphragmatic attenuation of the  inferior wall.  The patient also had a chest x-ray on Jan 21, 2008 which  showed no acute process and right pleural thickening that is chronic.   HISTORY OF PRESENT ILLNESS:  Mr. [name] is a pleasant 77 year old male  who presented to [facility] on Jan 21, 2008 with chest  pain that was substernal in location and 6/10 in intensity, sharp in  description for 3 days; waxing and waning in pattern.  The patient had  just recently finished the course of Avelox for bilateral pneumonia 4  weeks prior.  He was admitted to a medical telemetry unit and cardiac  enzymes were checked serially every 8 hours x3.  A 2-D echocardiogram  with the results above was ordered.  Given that his risk factors were  moderate, the patient was also recommended for a 2-D echocardiogram  Myoview and a cardiology consultation.  Dr. Juanda Chance was consulted and it  was his recommendation that we have done the right  course for the  patient.  Currently, the patient is symptom free.   DISCHARGE MEDICATIONS:  The patient is to continue his home medications,  including:  1. Aspirin 81 mg p.o. daily.  2. Hydrochlorothiazide 25 mg p.o. daily.  3. Simvastatin 40 mg daily.  4. The patient is also to continue Advair 250/50 one puff b.i.d.  5. Also, the patient is to continue Requip 1 mg p.o. nightly.      Lucita Ferrara, MD  Electronically Signed     RR/MEDQ  D:  01/23/2008  T:  01/23/2008  Job:  [id number]   cc:   Massie Maroon, MD

## 2011-02-01 NOTE — H&P (Signed)
[name], [name] NO.:  [id number]   MEDICAL RECORD NO.:  [id number]          PATIENT TYPE:  OBV   LOCATION:  4713                         FACILITY:  [facility]   PHYSICIAN:  Herbie Saxon, MDDATE OF BIRTH:  Dec 29, 1933   DATE OF ADMISSION:  01/21/2008  DATE OF DISCHARGE:                              HISTORY & PHYSICAL   PRIMARY CARE PHYSICIAN:  [name] Maroon, MD   He has no assigned health care power of attorney.  He is a full code.   PRESENTING COMPLAINT:  Chest pain x3 days.   HISTORY OF PRESENTING COMPLAINT:  This is a 77 year old Caucasian male  who was visiting his wife who was admitted 4 days ago to the hospital  when he started experiencing substernal chest pain, 6/10, sharp  radiating to the right side of his chest.  The patient has been having  this chest pain for the last 3 days, is intermittent with the waxing and  waning pattern.  It has been relieved by resting, increased by exertion,  and this is not associated with shortness of breath or palpitation, the  patient feels intermittently dizzy.  The patient also is noticed to have  dry cough with some night sweat, but there is no history of contact with  the PTB patient.  There is no chills, no shortness of breath, no fever  or nausea.  There is no recent travel.  The patient does not have any  generalized flu-like symptoms.  The patient denies any symptoms  referable to gastrointestinal or genitourinary system.  There is no  syncopal episode.  There is no seizure activity.  The patient just  finished a course of Avelox for bilateral pneumonia about 4 weeks ago  and this was prescribed as an outpatient by his primary care physician.Marland Kitchen  He claims good compliance with his blood pressure medication and aspirin  at home.   PAST MEDICAL HISTORY:  1. COPD for 17 years.  2. Hyperlipidemia 3 years.  3. Hypertension 4 years.  4. Restless leg syndrome 1 month.   PAST SURGICAL HISTORY:  Surgery on  Morton neuroma, left foot, surgery on  right shoulder 2002.   SOCIAL HISTORY:  He is married.  Lives with his wife.  Former smoker.  He has smoked 2-1/2 packs for greater than 20 years.  Quit in 1992.  History of drug abuse.  He is not an alcohol abuser.  He has 2 children  all living.   FAMILY HISTORY:  Father had coronary artery disease.  Mother heart  disease.  Brother cancer of the lung.   MEDICATIONS:  1. Aspirin 81 mg daily.  2. Metoprolol.  3. Hydrochlorothiazide.  4. Simvastatin.  5. ReQuip.   ALLERGIES:  No known drug allergies.   REVIEW OF SYSTEMS:  The 14-systems reviewed, pertinent positives as of  the history of presenting complaints.   PHYSICAL EXAMINATION:  GENERAL:  He is an elderly man not in acute  distress.  VITAL SIGNS:  Temperature is 97.2, pulse is 85, respiratory rate is 12,  and blood pressure 151/82.  HEENT:  Pupils equal, reactive to light and accommodation.  NECK:  Supple.  Extraocular muscles are intact.  There is no elevated  JVD.  No carotid bruit.  Mucous membranes are moist.  Oropharynx and  nasopharynx are clear.  CHEST:  Clinically clear.  HEART:  Heart sounds 1 and 2 regular rate and rhythm.  No murmurs.  Has  no rib cage tenderness.  No chest wall abnormality.  ABDOMEN:  He has a ventral hernia, which increases with positive cough  impulse.  There is no organomegaly.  Bowel sounds are normoactive, no  tenderness.  NEUROLOGIC:  He is alert and oriented in time, place, and person.  Cranial nerves II-XII intact.  Power is 5 in all limbs.  Deep tendon  reflexes 2+ in all limbs.  Peripheral pulse is present.  No pedal edema.   LABS:  The available lab shows WBC 8.9, hematocrit 46, platelet count is  265.  Chemistry shows a sodium of 139, potassium 3.7, chloride 108, BUN  15, creatinine 1.2, glucose is 4.  Troponin less than 0.05, CK-MB 2.4.  Chest x-ray shows chronic changes.  No acute process.  The EKG shows  normal sinus rhythm with  occasional supraventricular complexes.   ASSESSMENT:  Chest pain, rule out acute coronary artery syndrome, family  history of coronary artery disease, moderate hypertension, anxiety,  chronic obstructive pulmonary disease, and history of remote tobacco  abuse.   The patient is to be admitted to the observation telemetry bed.  We will  get serial cardiac enzymes and EKG q.8 h. x3.  Obtain his D-dimer,  hemoccult, 2D echocardiogram, NPO after midnight for possible stress  test and cardiology input in the morning.  We will get his thyroid  function tests, phospholipids, homocysteine, PT, PTT, INR, calcium,  magnesium, and phosphate levels.  We will put him on Lovenox 40 mg subcu  daily and Protonix 40 mg IV daily, Atrovent and Proventil q.6 h. p.r.n.  for shortness of breath.  Start him on Nitro patch 0.2 mg per hour,  Morphine 2 mg IV q.6 h. p.r.n., metoprolol 25 mg b.i.d., lisinopril 5 mg  daily, hydrochlorothiazide 12.5 mg daily, Xanax 0.25 mg b.i.d.  Continue  his home medications of Cymbalta 40 mg nightly, ReQuip 1 tablet daily,  Phenergan 12.5 mg IV q.8 h. p.r.n. for nausea.   DIET:  Heart healthy, low cholesterol.   ACTIVITY:  Bedrest.   IV fluid, half normal saline at 25 ml/hr. If  blood pressure greater  than 160/110.  Lopressor 2.5 mg IV will be given p.r.n. q.6 h.  Oxygen 2  to 5 L by nasal cannula p.r.n. to keep sats greater than 90.  The  patient's illness, medications, treatment and plan explained to him and  verbalizes understanding.      Herbie Saxon, MD  Electronically Signed    MIO/MEDQ  D:  01/21/2008  T:  01/22/2008  Job:  [id number]   cc:   [name] Maroon, MD

## 2011-02-04 ENCOUNTER — Ambulatory Visit: Payer: Medicare Other | Admitting: Internal Medicine

## 2011-02-04 DIAGNOSIS — Z0289 Encounter for other administrative examinations: Secondary | ICD-10-CM

## 2011-02-04 NOTE — Op Note (Signed)
NAME:  [name] [name], [name] [name]                           ACCOUNT NO.:  [id number]   MEDICAL RECORD NO.:  [id number]                   PATIENT TYPE:  AMB   LOCATION:  DSC                                  FACILITY:  [facility]   PHYSICIAN:  Currie Paris, M.D.           DATE OF BIRTH:  20-Nov-1933   DATE OF PROCEDURE:  02/20/2004  DATE OF DISCHARGE:                                 OPERATIVE REPORT   PREOPERATIVE DIAGNOSIS:  Inflamed epidermoid cyst anterior chest wall and  right upper arm.   POSTOPERATIVE DIAGNOSIS:  Inflamed epidermoid cyst anterior chest wall and  right upper arm.   OPERATION:  Excision epidermoid cyst x 2.   SURGEON:  Currie Paris, M.D.   ANESTHESIA:  Local 1% Xylocaine.   CLINICAL HISTORY:  Mr. Mahabir has two draining cystic areas that appear to  be epidermoid cysts.  They have settled down somewhat on antibiotics and he  is brought in electively to have these excised.   DESCRIPTION OF PROCEDURE:  In the minor procedure, each was identified and  we could find the punctum.  They were marked and the areas were anesthetized  with 1% Xylocaine with epinephrine.  I waited about ten minutes and for each  did elliptical incisions into the subcutaneous tissue around the epidermoid  cyst and excised it en toto.  There did appear to be somewhat watery  material coming out of the lateral arm one, but I got both cysts out  completely intact.  Each area was checked for hemostasis and closed with 4-0  Prolene with sterile dressings being applied.  The patient tolerated the  procedure well.                                               Currie Paris, M.D.    CJS/MEDQ  D:  02/20/2004  T:  02/20/2004  Job:  [id number]
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
## 2011-02-04 NOTE — Discharge Summary (Signed)
NAME:  [name] [name], [name] [name]                           ACCOUNT NO.:  [id number]   MEDICAL RECORD NO.:  [id number]                   PATIENT TYPE:  INP   LOCATION:  0442                                 FACILITY:  [facility]   PHYSICIAN:  Charlaine Dalton. Sherene Sires, M.D. [facility]           DATE OF BIRTH:  1933-12-12   DATE OF ADMISSION:  10/21/2003  DATE OF DISCHARGE:  10/27/2003                                 DISCHARGE SUMMARY   FINAL DIAGNOSES:  1. Chronic obstructive pulmonary disease exacerbation secondary to infected     bleb with pleuritic pain and increased dyspnea over baseline.     A. Sputum reported to be purulent on admission, but grew normal flora.     B. Marked improvement clinically after six days of combined Maxipen and        Cipro therapy, to complete two weeks of Cipro as an outpatient at 750        mg  b.i.d.  2. Underlying severe bullous emphysematous chronic obstructive pulmonary     disease.   HISTORY:  Please see dictated H&P. In addition to the reported history, this  patient also had progressively worsening right pleuritic chest discomfort. I  suspect that he had pneumonia, but on CT scan he proved to have an infected  bleb in the right upper lobe with prominent air-fluid level. He also had  evidence of mild chronic rhinitis.  His lab studies revealed normal flora in  his sputum. His BNP was less than 30. His white count prior to prednisone  was 11,200 with a left shift.   During hospitalization the patient had no significant fever, but very  purulent sputum that turned clear with antibiotic therapy and almost  complete resolution of his pleuritic pain without the need for pain  medications.   At the time of discharge the patient was ambulatory, saturating well on room  air, and able to lie perfectly flat without any increased work of breathing,  adequate saturations, and decreased breath sounds, but no wheezing at all.  He felt minimal improvement from the vest and did well with chest  percussion  in the prone position which I have strongly encouraged him to continue at  home as much as possible.   At the time of discharge he will be maintained on room air, treated with  seven more days of Cipro 750 mg b.i.d. plus the following medications:  1. Continue Advair 100/50 b.i.d.  2. Humibid LA (or Mucinex OTC) 600 mg two q.12h.  3. Prednisone 20 mg daily until seen in one week with a chest x-ray.   He is to maintain normal activity and diet and to call in the meantime if  his condition worsens.  Charlaine Dalton. Sherene Sires, M.D. [facility]    MBW/MEDQ  D:  10/27/2003  T:  10/27/2003  Job:  [id number]

## 2011-02-04 NOTE — Discharge Summary (Signed)
[facility]  Patient:    [name] [name], [name] [name] Visit Number: [id number] MRN: [id number]          Service Type: SUR Location: 4W [id number] 01 Attending Physician:  Marlowe Kays Page Dictated by:   Ralene Bathe, P.A. Adm. Date:  05/14/2001 Disc. Date: 05/16/01                             Discharge Summary  ADMISSION DIAGNOSES: 1. Torn left rotator cuff with rotator cuff impingement. 2. History of asthma, bronchitis and emphysema.  DISCHARGE DIAGNOSES: 1. Torn left rotator cuff with rotator cuff impingement. 2. History of asthma, bronchitis and emphysema. 3. Repair of left rotator cuff repair.  PROCEDURE:  Open left rotator cuff repair with anterior acrominonectomy. Surgeon Dr. Simonne Come with assistant Denton Brick, P.A.-C under general anesthesia.  HISTORY OF PRESENT ILLNESS:  [name] [name] is a 76 year old male who injured his left shoulder and has had pain over approximately about a year.  MRI was obtained which showed rotator cuff tear and surgery was recommended.  He was followed by another orthopedist, however, eventually referred himself to our practice for further care.  He is having significant day and night pain with weakness in his shoulder.  MRI again demonstrated the full-thickness rotator cuff tear.  With these findings, it is felt that surgical intervention is again indicated and this was explained to the patient.  At this time, he wishes to proceed.  HOSPITAL COURSE:  The patient was admitted and underwent the above-named procedure and tolerated this well.  All appropriate IV antibiotics and analgesics were utilized.  Postoperatively, the patient had significant amount of pain control problems and was requiring an additional day.  Postop day #2, he was much more stable and he was afebrile.  He was neurovascularly intact. His pain was controlled on p.o. analgesics.  He was ready for discharge home.  LABORATORY DATA AND X-RAY FINDINGS:   Admission preop labs within normal limits.  Radiology showed his COPD.  EKG showed normal sinus rhythm.  CONDITION ON DISCHARGE:  Stable and improved.  DISCHARGE MEDICATIONS: 1. Percocet 5 mg #40 one to two every four to six hours p.r.n. pain. 2. Valium 5 mg #30 one every eight hours p.r.n. spasm.  DISPOSITION:  Discharged to home.  SPECIAL INSTRUCTIONS:  He is in a sling immobilizer.  OT consult for ADLs. Dry dressing change q.d.  He may shower.  Resume Bextra (he has samples at home).  DIET:  Resume home diet.  FOLLOWUP:  Follow up in two weeks. Dictated by:   Ralene Bathe, P.A. Attending Physician:  Joaquin Courts DD:  05/16/01 TD:  05/16/01 Job: [id number]

## 2011-02-04 NOTE — H&P (Signed)
NAME:  [name] [name], [name] [name]                           ACCOUNT NO.:  [id number]   MEDICAL RECORD NO.:  [id number]                   PATIENT TYPE:  INP   LOCATION:  0442                                 FACILITY:  [facility]   PHYSICIAN:  Charlaine Dalton. Sherene Sires, M.D. [facility]           DATE OF BIRTH:  1934/07/30   DATE OF ADMISSION:  10/21/2003  DATE OF DISCHARGE:                                HISTORY & PHYSICAL   CHIEF COMPLAINT:  Cough and shortness of breath x 2 weeks.   HISTORY OF PRESENT ILLNESS:  The patient is a 77 year old white male,  patient of Dr. Sherene Sires, with a history of COPD and asthmatic bronchitis and a  former smoker.  The patient presents with a two week history of persistent  productive cough with thick yellow mucus, dyspnea on exertion, wheezing,  painful coughing paroxysms and fevers, chills, sweats.  The patient was  seen, on October 14, 2003, and treated with a seven day course of Levaquin  750 and a Depo-Medrol injection.  The patient has had no improvement of  symptoms and continues to have severe coughing paroxysms and weakness.  He  denies any associated chest pain, leg swelling, orthopnea, PND, hemoptysis,  or recent travel.  The patient has been refractory to outpatient therapy  and, therefore, requires hospitalization for IV antibiotics, steroids and  nebulizer treatment.   PAST MEDICAL HISTORY:  1. COPD.  2. Asthmatic bronchitis.   SURGICAL HISTORY:  Status post rotator cuff surgery.   CURRENT MEDICATIONS:  1. Advair 100/50 b.i.d.  2. Albuterol p.r.n.   DRUG ALLERGIES:  No known drug allergies.   SOCIAL HISTORY:  The patient is married, retired, has two children.  He is  an ex-smoker x 13 years.   FAMILY HISTORY:  Positive for asthma and coronary artery disease.  Negative  for diabetes mellitus.   REVIEW OF SYSTEMS:  Essentially negative except as noted above.   PHYSICAL EXAMINATION:  GENERAL:  The patient is a well-developed, well-  nourished, white male in no acute  distress.  VITAL SIGNS:  Temperature 97.4, blood pressure 136/68, pulse regular at 119,  O2 saturation 92% on room air, weight is at 205.  HEENT:  PERRLA.  Conjunctivae not injected.  Sclera anicteric.  TMs are  normal.  The patient's pharynx is clear without any exudate.  NECK:  Supple without cervical adenopathy.  No JVD.  Carotids are equal.  Positive __________ bilaterally without any bruits.  LUNGS:  Sounds reveal diminished breath sounds at the bases with a few faint  expiratory wheezes.  CARDIAC:  S1, S2 without murmur, rub or gallop.  ABDOMEN:  Soft without any hepatosplenomegaly.  No guarding, no rebound  noted.  EXTREMITIES:  Warm without any calf __________ clubbing or edema.   IMPRESSION/PLAN:  Asthmatic bronchitic exacerbation, refractory to  outpatient therapy.  The patient will be admitted for intravenous  antibiotics, Avelox.  Hand-held nebulizer with albuterol  and Atrovent.  Oxygen therapy.  Labs including chest x-ray and sputum culture are currently  pending.     Tammy Parrett, P.A. [facility]                   Michael B. Sherene Sires, M.D. [facility]    TP/MEDQ  D:  10/21/2003  T:  10/21/2003  Job:  [id number]   cc:   Charlaine Dalton. Sherene Sires, M.D. [facility]

## 2011-03-03 ENCOUNTER — Ambulatory Visit: Payer: Medicare Other | Admitting: Internal Medicine

## 2011-04-23 ENCOUNTER — Inpatient Hospital Stay (HOSPITAL_COMMUNITY)
Admission: EM | Admit: 2011-04-23 | Discharge: 2011-04-27 | DRG: 312 | Disposition: A | Discharge status: Home / Self Care | Payer: Medicare Other | Attending: Internal Medicine | Admitting: Internal Medicine

## 2011-04-23 ENCOUNTER — Emergency Department (HOSPITAL_COMMUNITY): Payer: Medicare Other

## 2011-04-23 DIAGNOSIS — J4489 Other specified chronic obstructive pulmonary disease: Secondary | ICD-10-CM | POA: Diagnosis present

## 2011-04-23 DIAGNOSIS — Z96649 Presence of unspecified artificial hip joint: Secondary | ICD-10-CM

## 2011-04-23 DIAGNOSIS — M47817 Spondylosis without myelopathy or radiculopathy, lumbosacral region: Secondary | ICD-10-CM | POA: Diagnosis present

## 2011-04-23 DIAGNOSIS — K4090 Unilateral inguinal hernia, without obstruction or gangrene, not specified as recurrent: Secondary | ICD-10-CM | POA: Diagnosis present

## 2011-04-23 DIAGNOSIS — J449 Chronic obstructive pulmonary disease, unspecified: Secondary | ICD-10-CM | POA: Diagnosis present

## 2011-04-23 DIAGNOSIS — I10 Essential (primary) hypertension: Secondary | ICD-10-CM | POA: Diagnosis present

## 2011-04-23 DIAGNOSIS — E785 Hyperlipidemia, unspecified: Secondary | ICD-10-CM | POA: Diagnosis present

## 2011-04-23 DIAGNOSIS — I519 Heart disease, unspecified: Secondary | ICD-10-CM | POA: Diagnosis present

## 2011-04-23 DIAGNOSIS — F329 Major depressive disorder, single episode, unspecified: Secondary | ICD-10-CM | POA: Diagnosis present

## 2011-04-23 DIAGNOSIS — R55 Syncope and collapse: Principal | ICD-10-CM | POA: Diagnosis present

## 2011-04-23 DIAGNOSIS — F1410 Cocaine abuse, uncomplicated: Secondary | ICD-10-CM | POA: Diagnosis present

## 2011-04-23 DIAGNOSIS — F17200 Nicotine dependence, unspecified, uncomplicated: Secondary | ICD-10-CM | POA: Diagnosis present

## 2011-04-23 DIAGNOSIS — E119 Type 2 diabetes mellitus without complications: Secondary | ICD-10-CM | POA: Diagnosis present

## 2011-04-23 LAB — CBC
HCT: 44.5 % (ref 39.0–52.0)
Hemoglobin: 15.4 g/dL (ref 13.0–17.0)
MCH: 30.8 pg (ref 26.0–34.0)
MCHC: 34.6 g/dL (ref 30.0–36.0)
MCV: 89 fL (ref 78.0–100.0)
Platelets: 230 K/uL (ref 150–400)
RBC: 5 MIL/uL (ref 4.22–5.81)
RDW: 13.5 % (ref 11.5–15.5)
WBC: 8.6 K/uL (ref 4.0–10.5)

## 2011-04-23 LAB — URINALYSIS, ROUTINE W REFLEX MICROSCOPIC
Glucose, UA: NEGATIVE mg/dL
Hgb urine dipstick: NEGATIVE
Ketones, ur: NEGATIVE mg/dL
Leukocytes, UA: NEGATIVE
pH: 7 (ref 5.0–8.0)

## 2011-04-23 LAB — DIFFERENTIAL
Basophils Absolute: 0.1 K/uL (ref 0.0–0.1)
Basophils Relative: 1 % (ref 0–1)
Eosinophils Absolute: 0.2 K/uL (ref 0.0–0.7)
Eosinophils Relative: 3 % (ref 0–5)
Lymphocytes Relative: 18 % (ref 12–46)
Lymphs Abs: 1.5 K/uL (ref 0.7–4.0)
Monocytes Absolute: 0.5 K/uL (ref 0.1–1.0)
Monocytes Relative: 6 % (ref 3–12)
Neutro Abs: 6.2 K/uL (ref 1.7–7.7)
Neutrophils Relative %: 72 % (ref 43–77)

## 2011-04-23 LAB — POCT I-STAT, CHEM 8
BUN: 13 mg/dL (ref 6–23)
Calcium, Ion: 1.17 mmol/L (ref 1.12–1.32)
Chloride: 104 meq/L (ref 96–112)
Creatinine, Ser: 1.1 mg/dL (ref 0.50–1.35)
Glucose, Bld: 101 mg/dL — ABNORMAL HIGH (ref 70–99)
HCT: 45 % (ref 39.0–52.0)
Hemoglobin: 15.3 g/dL (ref 13.0–17.0)
Potassium: 3.6 meq/L (ref 3.5–5.1)
Sodium: 142 meq/L (ref 135–145)
TCO2: 27 mmol/L (ref 0–100)

## 2011-04-23 LAB — RAPID URINE DRUG SCREEN, HOSP PERFORMED
Amphetamines: NOT DETECTED
Benzodiazepines: NOT DETECTED
Tetrahydrocannabinol: NOT DETECTED

## 2011-04-23 LAB — CK TOTAL AND CKMB (NOT AT ARMC): Total CK: 48 U/L (ref 7–232)

## 2011-04-23 LAB — GLUCOSE, CAPILLARY: Glucose-Capillary: 94 mg/dL (ref 70–99)

## 2011-04-24 DIAGNOSIS — R42 Dizziness and giddiness: Secondary | ICD-10-CM

## 2011-04-24 LAB — CARDIAC PANEL(CRET KIN+CKTOT+MB+TROPI)
CK, MB: 1.6 ng/mL (ref 0.3–4.0)
Relative Index: INVALID (ref 0.0–2.5)
Total CK: 38 U/L (ref 7–232)
Total CK: 40 U/L (ref 7–232)

## 2011-04-24 LAB — HEMOGLOBIN A1C: Hgb A1c MFr Bld: 6.1 % — ABNORMAL HIGH (ref <5.7)

## 2011-04-25 LAB — GLUCOSE, CAPILLARY
Glucose-Capillary: 96 mg/dL (ref 70–99)
Glucose-Capillary: 86 mg/dL (ref 70–99)

## 2011-04-25 LAB — D-DIMER, QUANTITATIVE: D-Dimer, Quant: 0.53 ug/mL-FEU — ABNORMAL HIGH (ref 0.00–0.48)

## 2011-04-25 LAB — COMPREHENSIVE METABOLIC PANEL
AST: 10 U/L (ref 0–37)
Albumin: 3.2 g/dL — ABNORMAL LOW (ref 3.5–5.2)
Calcium: 9.4 mg/dL (ref 8.4–10.5)
Creatinine, Ser: 0.86 mg/dL (ref 0.50–1.35)

## 2011-04-25 LAB — CBC
MCH: 31.4 pg (ref 26.0–34.0)
MCHC: 35.3 g/dL (ref 30.0–36.0)
Platelets: 239 10*3/uL (ref 150–400)
RBC: 5.09 MIL/uL (ref 4.22–5.81)

## 2011-04-26 ENCOUNTER — Other Ambulatory Visit: Payer: Self-pay

## 2011-04-26 ENCOUNTER — Inpatient Hospital Stay (HOSPITAL_COMMUNITY): Payer: Medicare Other

## 2011-04-26 DIAGNOSIS — F329 Major depressive disorder, single episode, unspecified: Secondary | ICD-10-CM

## 2011-04-26 MED ORDER — PRODIGY LANCETS 28G MISC: Status: DC

## 2011-04-26 MED ORDER — GLUCOSE BLOOD VI STRP: ORAL_STRIP | Status: DC

## 2011-04-26 NOTE — H&P (Signed)
[name] [name], [name] [name] NO.:  [id number]  MEDICAL RECORD NO.:  [id number]  LOCATION:  5531                         FACILITY:  [facility]  PHYSICIAN:  Tarry Kos, MD       DATE OF BIRTH:  03-18-1934  DATE OF ADMISSION:  04/23/2011 DATE OF DISCHARGE:                             HISTORY & PHYSICAL   CHIEF COMPLAINT:  Dizziness.  HISTORY OF PRESENT ILLNESS:  [name] [name] is a 77 year old male who has a history of cocaine abuse who is still actively using cocaine.  His last hit of cocaine was yesterday, who was at the gas station today and had a significant amount of dizziness, at which point, he got dizzy.  He went down on the ground to his knees, his dizziness resolved.  He did not pass out.  He denies any chest pain during this episode.  Denied any shortness of breath.  He says he always has dizziness, but he does not really know if it is related to his cocaine abuse or not, and he does not think it is related to his drug abuse.  He does not have any known coronary artery disease or history of arrhythmias in the past.  He has been in his normal state of health, otherwise denies nausea, vomiting, abdominal pain, or any diarrhea.  REVIEW OF SYSTEMS:  Otherwise negative.  PAST MEDICAL HISTORY: 1. Cocaine abuse. 2. Hypertension. 3. Diabetes. 4. Hyperlipidemia. 5. Hypertension. 6. Status post left hip replacement. 7. Shoulder surgery. 8. He probably has a left-sided hernia, which has not been evaluated     by surgeon yet.  He complains of occasional pain in this area. 9. Recent stress testing in May 2011, which was negative of his heart.  MEDICATIONS:  He takes no medications.  He says he has been off of all of his diabetic meds.  He has lost like 60 pounds.  He says over the last year and so, he states that he does not need any of his medicines anymore.  He uses cocaine to help with his tress.  SOCIAL HISTORY:  He smokes, denies alcohol.  No IV drug abuse, but  does use cocaine.  PHYSICAL EXAMINATION:  VITAL SIGNS:  Temperature is 98.2, blood pressure 140/19, pulse in the 80s, respiration 18, 97% O2 on room air. GENERAL:  Alert and oriented x4.  No apparent distress, cooperative and friendly. HEENT:  Extraocular muscles intact.  Pupils are equal and reactive to light.  Oropharynx clear.  Mucous membrane moist. NECK:  No JVD.  No carotid bruits. COR:  Regular rate and rhythm without murmurs, rubs, or gallops. CHEST:  Clear to auscultation bilaterally.  No wheezes, rhonchi, or rales. ABDOMEN:  Soft, nontender, and nondistended.  Positive bowel sounds.  No hepatosplenomegaly.  No hernia is palpated.  Nonacute abdomen. EXTREMITIES:  No clubbing, cyanosis, or edema. PSYCHIATRIC:  Normal affect. NEUROLOGIC:  No focal neurologic deficits.  Cranial nerves II through XII grossly intact.  5/5 strength upper and lower extremities bilaterally and equal.  LABORATORY STUDIES:  MRI of the brain was done that did not show any acute infarct.  Cardiac enzymes are negative.  CBC is normal.  I-STAT is also normal.  A 12-lead EKG shows normal sinus rhythm with PVCs with no acute ST-T wave changes.  ASSESSMENT AND PLAN:  This is a 77 year old male with a presyncopal event. 1. Presyncope.  We will check orthostatics now and in the morning.  We     will monitor closely for any arrhythmias.  We will obtain neuro     checks every 4 hours.  He currently has no focal neurologic     deficits.  We will serial his cardiac enzymes.  He does not appear     significantly dehydrated.  We will place him on a baby aspirin,     could be related to his cocaine abuse. 2. Cocaine abuse.  His urine drug screening is pending.  We will also     check a urinalysis, make sure he does not have underlying     infection.  We will obtain a social work consult for his drug     abuse.  We will also obtain a physical therapy evaluation. 3. Previous history of diabetes.  We will check a  hemoglobin A1c. 4. Mild hypertension.  We will monitor this and check his orthostatics     and institute medications as needed; however, he is noncompliant     with his medications. 5. The patient is a full code.  Further recommendation pending over     hospital course.          ______________________________ Tarry Kos, MD     RD/MEDQ  D:  04/23/2011  T:  04/23/2011  Job:  [id number]  Electronically Signed by Tarry Kos MD on 04/26/2011 09:57:41 PM

## 2011-05-08 ENCOUNTER — Emergency Department (HOSPITAL_COMMUNITY): Payer: Medicare Other

## 2011-05-08 ENCOUNTER — Emergency Department (HOSPITAL_COMMUNITY)
Admission: EM | Admit: 2011-05-08 | Discharge: 2011-05-08 | Disposition: A | Discharge status: Home / Self Care | Payer: Medicare Other | Attending: Emergency Medicine | Admitting: Emergency Medicine

## 2011-05-08 DIAGNOSIS — J4489 Other specified chronic obstructive pulmonary disease: Secondary | ICD-10-CM | POA: Insufficient documentation

## 2011-05-08 DIAGNOSIS — R51 Headache: Secondary | ICD-10-CM | POA: Insufficient documentation

## 2011-05-08 DIAGNOSIS — E119 Type 2 diabetes mellitus without complications: Secondary | ICD-10-CM | POA: Insufficient documentation

## 2011-05-08 DIAGNOSIS — J449 Chronic obstructive pulmonary disease, unspecified: Secondary | ICD-10-CM | POA: Insufficient documentation

## 2011-05-08 DIAGNOSIS — R42 Dizziness and giddiness: Secondary | ICD-10-CM | POA: Insufficient documentation

## 2011-05-08 LAB — URINALYSIS, ROUTINE W REFLEX MICROSCOPIC
Bilirubin Urine: NEGATIVE
Glucose, UA: NEGATIVE mg/dL
Hgb urine dipstick: NEGATIVE
Ketones, ur: 15 mg/dL — AB
Protein, ur: NEGATIVE mg/dL
Urobilinogen, UA: 1 mg/dL (ref 0.0–1.0)

## 2011-05-08 LAB — RAPID URINE DRUG SCREEN, HOSP PERFORMED
Amphetamines: NOT DETECTED
Barbiturates: NOT DETECTED
Opiates: NOT DETECTED
Tetrahydrocannabinol: NOT DETECTED

## 2011-05-08 LAB — POCT I-STAT, CHEM 8
Creatinine, Ser: 1.1 mg/dL (ref 0.50–1.35)
Glucose, Bld: 84 mg/dL (ref 70–99)
Hemoglobin: 16.7 g/dL (ref 13.0–17.0)
Potassium: 3.9 mEq/L (ref 3.5–5.1)
TCO2: 31 mmol/L (ref 0–100)

## 2011-05-09 ENCOUNTER — Ambulatory Visit: Payer: Medicare Other | Admitting: Internal Medicine

## 2011-06-15 NOTE — Discharge Summary (Signed)
[name] [name], [name]:  [id number]  MEDICAL RECORD NO.:  [id number]  LOCATION:  5531                         FACILITY:  [facility]  PHYSICIAN:  [name], MD      DATE OF BIRTH:  April 08, 1934  DATE OF ADMISSION:  04/23/2011 DATE OF DISCHARGE:                              DISCHARGE SUMMARY   PRIMARY CARE PHYSICIAN:  [name], MD  DISCHARGE DIAGNOSES: 1. Presyncope with orthostasis - resolved. 2. Cocaine abuse - counseled. 3. Left hip and back pain, MRI pending. 4. Major depressive disorder. 5. History of diabetes mellitus with current hemoglobin A1c is 6.1. 6. Hypertension. 7. Hyperlipidemia. 8. History of left hip replacement. 9. History of shoulder surgery. 10.Diastolic dysfunction grade 1. 64.QIHKVQQ abuse.  HISTORY AND BRIEF HOSPITAL COURSE:  [name] [name] is 77 year old male who reports that he has been actively using cocaine over the last year.  He came to the emergency department after undergoing a significant amount of dizziness at the gas station earlier in the day on April 23, 2011, he did not pass out.  He denied any chest pain.  He denied any other symptoms such as nausea, vomiting, abdominal pain, or diarrhea reporting that he had been in his usual state of health.  The patient was admitted to the [facility], orthostatics were checked and he was placed on telemetry and monitored closely for arrhythmias.  Initially, he received neuro checks every 4 hours.  Cardiac enzymes were cycled and found to be negative.  He was placed on a baby aspirin daily.  Also he underwent 2-D echocardiogram and carotid Dopplers.  His 2-D echocardiogram was done on April 25, 2011.  He had an ejection fraction of 55-60% with Doppler parameters consistent with abnormal left ventricular relaxation, in other words grade 1 diastolic dysfunction. His aortic valve was mildly thickened with mildly calcified leaflet.  No regurgitation.  He had trivial  mitral valve regurgitation.  Results of his carotid Dopplers show right 40-59% ICA stenosis (low end of range) no evidence of significant ICA stenosis, bilateral vertebral artery flow was antegrade and duplex imaging of brachial artery demonstrate triphasic wave forms.  Further from cardiac standpoint, he was monitored on telemetry during his stay here.  He mostly stayed in sinus rhythm. He did have frequent PVCs, several episodes of bigeminy and some first- degree block.  Regarding his cocaine abuse, the patient reported that he started using cocaine last August because he was grieving for his wife who passed away approximately 2 years ago.  The patient was counseled.  He was seen by Psychiatry and refused an inpatient rehab.  For his major depressive disorder, again it was felt that the patient was grieving for his wife, also for his son and four grandchildren who recently moved to [place].  Psychiatry saw the patient in consultation and started him on 5 mg of escitalopram 1 tablet daily.  With regards to his history of diabetes, the patient's CBG's remained in the range of 70 to approximately 90.  His hemoglobin A1c this admission was 6.1.  The patient complains of left hip pain that radiates down his leg.  It sounds similar to  sciatica we currently are awaiting an MRI of his left leg and possible orthopedic consult.  The patient complains of a left inguinal hernia, this was not found on exam by the MD.  PHYSICAL EXAMINATION:  GENERAL:  The patient is alert and oriented.  His demeanor is pleasant.  He seems sad in mood. VITAL SIGNS:  Temperature is 98.2, pulse 87, respirations 20, blood pressure is currently 159/94, O2 sats 98% on room air. HEAD:  Atraumatic normocephalic. EYES:  Anicteric with pupils are equal, round, and reactive to light. Nose shows no nasal discharge or exterior lesions.  Mouth has moist mucous membranes with good dentition. NECK:  Supple with midline  trachea.  No JVD.  No lymphadenopathy. CHEST:  Demonstrates no accessory muscle use.  He has no wheezes or crackles to my exam. HEART:  Regular rate and rhythm without obvious murmurs, rubs, or gallops. ABDOMEN:  Soft, nontender, nondistended with bowel sounds. EXTREMITIES:  Show no clubbing, cyanosis, or edema.  He does have left- sided pain in his thigh when he flexes his knee.  There is no sign of obvious left inguinal hernia.  LABORATORY DATA:  On admission, the patient's white count was 8.6, hemoglobin 15.3, hematocrit 45.0, platelets 135.  He did have a slightly elevated D-dimer of 0.53, however, he had no other clinical symptoms to warrant a CT angio of his chest.  Glucose was 94, BMET on April 23, 2011, showed a sodium of 142, potassium 3.6, chloride 104, glucose 101, BUN 13, creatinine 1.1.  LFTs were tested during this admission found to be within normal limits.  His serum albumin was low at 3.2 with a serum calcium level 9.4.  Urine drug screen was positive for cocaine, negative for amphetamines, barbiturates, opiates, and THC.  Urinalysis was negative for infection.  RADIOLOGICAL EXAMS:  The patient had an MRI of his brain on admission that showed no acute infarct.  He did have mild small vessel disease- type changes.  He also had opacification of his posterior left ethmoid sinus air cells with mild mucosal thickening and the remainder of the paranasal sinuses.  He had a two-view chest x-ray on April 23, 2011, that showed no evidence of acute cardiopulmonary disease.  He did have emphysematous changes and he has an MRI of his lumbar spine and pelvis pending.  DISCHARGE MEDICATIONS: 1. Escitalopram 5 mg 1 tablet daily. 2. Aspirin 81 mg 1 tablet daily.  At this point in time, the patient     is not on any blood pressure medications.  DISCHARGE INSTRUCTIONS:  He will be discharged to home in stable condition.  It is our understanding that he plans to move to [place]  in September to live with his son and family.  His activity level, he is to increase activity slowly.  Diet is low-sodium heart-healthy. He was given a note to return to work 8/16.  FOLLOWUP APPOINTMENTS:  He is to see Dr. [name] on May 09, 2011, at 1:45 p.m.    OTHER INSTRUCTIONS:  He is to stop smoking.  No longer use of cocaine. He has been counseled.  SUGGESTIONS FOR OUTPATIENT FOLLOWUP: 1. Please closely monitor his blood pressure as he has been taken off     of his blood pressure medications. 2. He recently started escitalopram, could monitor him for depressive     symptoms and possibly liver function tests.     Stephani Police, PA   ______________________________ Suzzanne Cloud, MD    MLY/MEDQ  D:  04/26/2011  T:  04/27/2011  Job:  [id number]  cc:   [name], MD  Electronically Signed by Algis Downs PA on 04/28/2011 09:40:49 AM Electronically Signed by Ebony Cargo MD on 06/15/2011 10:25:25 AM

## 2011-06-15 NOTE — Discharge Summary (Signed)
  [name], [name] [name] NO.:  [id number]  MEDICAL RECORD NO.:  [id number]  LOCATION:  5531                         FACILITY:  [facility]  PHYSICIAN:  Baltazar Najjar, MD       DATE OF BIRTH:  03/02/1934  DATE OF ADMISSION:  04/23/2011 DATE OF DISCHARGE:                              DISCHARGE SUMMARY   ADDENDUM:  DISCHARGE DIAGNOSES: 1. Presyncope with orthostasis, resolved. 2. Cocaine abuse, counseled. 3. Left hip and back pain secondary to spondylosis and severe spinal     stenosis at L3-L4. 4. Major depressive disorder, started on selective serotonin reuptake     inhibitors. 5. History of diabetes mellitus, currently diet controlled. 6. Hypertension. 7. Hyperlipidemia. 8. History of left hip replacement. 9. History of shoulder surgery. 10.Cardiac diastolic dysfunction, grade 1. 11.Tobacco abuse.  The patient had an MRI of his lumbar spine and pelvis done yesterday on April 26, 2011.  The results were as follows:  Impression: 1. Mildly progressive spondylosis compared with previous MRI from 2     years ago.  There is slightly progressive moderate-to-severe spinal     stenosis at L3-L4 with left greater than right lateral recess and     left foraminal stenosis. 2. Stable chronic by foraminal stenosis at L4-L5. 3. No acute findings. 4. Cholelithiasis.  UPDATED DISCHARGE MEDICATIONS: 1. Acetaminophen 325 mg two tablets by mouth every 4 hours as needed     for pain. 2. Aspirin 81 mg one tablet daily by mouth. 3. Lexapro 5 mg one tablet by mouth daily. 4. Ultram 50 mg one tablet by mouth q.8 h. as needed for pain.  He will follow up with Dr. Jonny Ruiz on May 09, 2011.  The patient was advised that he could follow up as an out patient with an Orthopedic doctor for his back pain.  He replied that he sees Dr. Leslee Home for his back and would continue to follow with  him.     Stephani Police, PA   ______________________________ Andreas Blower,  MD    MLY/MEDQ  D:  04/27/2011  T:  04/27/2011  Job:  [id number]  cc:   [name], MD  Electronically Signed by Algis Downs PA on 04/28/2011 09:42:54 AM Electronically Signed by Ebony Cargo MD on 06/15/2011 10:25:31 AM

## 2011-08-11 ENCOUNTER — Observation Stay (HOSPITAL_COMMUNITY)
Admission: EM | Admit: 2011-08-11 | Discharge: 2011-08-13 | Disposition: A | Discharge status: Home / Self Care | Payer: Medicare (Managed Care) | Attending: Internal Medicine | Admitting: Internal Medicine

## 2011-08-11 ENCOUNTER — Other Ambulatory Visit: Payer: Self-pay

## 2011-08-11 ENCOUNTER — Encounter (HOSPITAL_COMMUNITY): Payer: Self-pay | Admitting: *Deleted

## 2011-08-11 ENCOUNTER — Emergency Department (HOSPITAL_COMMUNITY): Payer: Medicare (Managed Care)

## 2011-08-11 DIAGNOSIS — Z79899 Other long term (current) drug therapy: Secondary | ICD-10-CM | POA: Insufficient documentation

## 2011-08-11 DIAGNOSIS — F1410 Cocaine abuse, uncomplicated: Secondary | ICD-10-CM | POA: Insufficient documentation

## 2011-08-11 DIAGNOSIS — K219 Gastro-esophageal reflux disease without esophagitis: Secondary | ICD-10-CM | POA: Insufficient documentation

## 2011-08-11 DIAGNOSIS — I10 Essential (primary) hypertension: Secondary | ICD-10-CM | POA: Insufficient documentation

## 2011-08-11 DIAGNOSIS — F17200 Nicotine dependence, unspecified, uncomplicated: Secondary | ICD-10-CM | POA: Insufficient documentation

## 2011-08-11 DIAGNOSIS — E860 Dehydration: Secondary | ICD-10-CM | POA: Diagnosis present

## 2011-08-11 DIAGNOSIS — J4489 Other specified chronic obstructive pulmonary disease: Secondary | ICD-10-CM | POA: Insufficient documentation

## 2011-08-11 DIAGNOSIS — E119 Type 2 diabetes mellitus without complications: Secondary | ICD-10-CM | POA: Insufficient documentation

## 2011-08-11 DIAGNOSIS — F329 Major depressive disorder, single episode, unspecified: Secondary | ICD-10-CM

## 2011-08-11 DIAGNOSIS — R42 Dizziness and giddiness: Principal | ICD-10-CM | POA: Diagnosis present

## 2011-08-11 DIAGNOSIS — E785 Hyperlipidemia, unspecified: Secondary | ICD-10-CM | POA: Insufficient documentation

## 2011-08-11 DIAGNOSIS — R109 Unspecified abdominal pain: Secondary | ICD-10-CM

## 2011-08-11 DIAGNOSIS — R262 Difficulty in walking, not elsewhere classified: Secondary | ICD-10-CM | POA: Insufficient documentation

## 2011-08-11 DIAGNOSIS — E876 Hypokalemia: Secondary | ICD-10-CM | POA: Insufficient documentation

## 2011-08-11 DIAGNOSIS — Z720 Tobacco use: Secondary | ICD-10-CM | POA: Diagnosis present

## 2011-08-11 DIAGNOSIS — J449 Chronic obstructive pulmonary disease, unspecified: Secondary | ICD-10-CM | POA: Insufficient documentation

## 2011-08-11 LAB — BASIC METABOLIC PANEL
CO2: 29 mEq/L (ref 19–32)
Calcium: 9.6 mg/dL (ref 8.4–10.5)
GFR calc non Af Amer: 81 mL/min — ABNORMAL LOW (ref >90)
Potassium: 3.2 mEq/L — ABNORMAL LOW (ref 3.5–5.1)
Sodium: 139 mEq/L (ref 135–145)

## 2011-08-11 LAB — RAPID URINE DRUG SCREEN, HOSP PERFORMED
Amphetamines: NOT DETECTED
Cocaine: POSITIVE — AB
Opiates: NOT DETECTED

## 2011-08-11 LAB — RPR: RPR Ser Ql: NONREACTIVE

## 2011-08-11 LAB — CBC
HCT: 43.1 % (ref 39.0–52.0)
MCV: 90.6 fL (ref 78.0–100.0)
MCV: 91.5 fL (ref 78.0–100.0)
Platelets: 266 10*3/uL (ref 150–400)
RBC: 5.02 MIL/uL (ref 4.22–5.81)
RBC: 4.71 MIL/uL (ref 4.22–5.81)
RDW: 13.8 % (ref 11.5–15.5)
WBC: 8.9 10*3/uL (ref 4.0–10.5)
WBC: 8.8 10*3/uL (ref 4.0–10.5)

## 2011-08-11 LAB — CREATININE, SERUM: GFR calc Af Amer: 66 mL/min — ABNORMAL LOW (ref >90)

## 2011-08-11 LAB — GLUCOSE, CAPILLARY
Glucose-Capillary: 103 mg/dL — ABNORMAL HIGH (ref 70–99)
Glucose-Capillary: 87 mg/dL (ref 70–99)

## 2011-08-11 LAB — DIFFERENTIAL
Basophils Absolute: 0.1 10*3/uL (ref 0.0–0.1)
Eosinophils Absolute: 0.4 10*3/uL (ref 0.0–0.7)
Eosinophils Relative: 4 % (ref 0–5)
Lymphocytes Relative: 22 % (ref 12–46)
Neutrophils Relative %: 66 % (ref 43–77)

## 2011-08-11 LAB — URINALYSIS, ROUTINE W REFLEX MICROSCOPIC
Glucose, UA: NEGATIVE mg/dL
Leukocytes, UA: NEGATIVE
pH: 6.5 (ref 5.0–8.0)

## 2011-08-11 LAB — CARDIAC PANEL(CRET KIN+CKTOT+MB+TROPI)
CK, MB: 1.6 ng/mL (ref 0.3–4.0)
Troponin I: <0.3 ng/mL (ref <0.30)

## 2011-08-11 MED ORDER — POTASSIUM CHLORIDE IN NACL 20-0.9 MEQ/L-% IV SOLN
INTRAVENOUS | Status: DC
  Administered 2011-08-11: 12:00:00 via INTRAVENOUS
  Filled 2011-08-11 (×4): qty 1000

## 2011-08-11 MED ORDER — CITALOPRAM HYDROBROMIDE 10 MG PO TABS
10.0000 mg | ORAL_TABLET | Freq: Every day | ORAL | Status: DC
  Administered 2011-08-11: 10 mg via ORAL

## 2011-08-11 MED ORDER — ONDANSETRON HCL 4 MG/2ML IJ SOLN: 4.0000 mg | Freq: Four times a day (QID) | INTRAMUSCULAR | Status: DC | PRN

## 2011-08-11 MED ORDER — CITALOPRAM HYDROBROMIDE 10 MG PO TABS
10.0000 mg | ORAL_TABLET | Freq: Every day | ORAL | Status: DC
  Administered 2011-08-12 – 2011-08-13 (×2): 10 mg via ORAL
  Filled 2011-08-11 (×3): qty 1

## 2011-08-11 MED ORDER — PANTOPRAZOLE SODIUM 40 MG PO TBEC
40.0000 mg | DELAYED_RELEASE_TABLET | Freq: Every day | ORAL | Status: DC
  Administered 2011-08-11 – 2011-08-13 (×3): 40 mg via ORAL
  Filled 2011-08-11 (×4): qty 1

## 2011-08-11 MED ORDER — CITALOPRAM HYDROBROMIDE 10 MG PO TABS
10.0000 mg | ORAL_TABLET | ORAL | Status: AC
  Filled 2011-08-11: qty 1

## 2011-08-11 MED ORDER — MECLIZINE HCL 25 MG PO TABS
25.0000 mg | ORAL_TABLET | Freq: Once | ORAL | Status: AC
  Administered 2011-08-11: 25 mg via ORAL
  Filled 2011-08-11: qty 1

## 2011-08-11 MED ORDER — SIMVASTATIN 40 MG PO TABS
40.0000 mg | ORAL_TABLET | Freq: Every day | ORAL | Status: DC
  Administered 2011-08-12: 40 mg via ORAL
  Filled 2011-08-11 (×3): qty 1

## 2011-08-11 MED ORDER — ONDANSETRON HCL 4 MG PO TABS: 4.0000 mg | ORAL_TABLET | Freq: Four times a day (QID) | ORAL | Status: DC | PRN

## 2011-08-11 MED ORDER — POTASSIUM CHLORIDE CRYS ER 20 MEQ PO TBCR
40.0000 meq | EXTENDED_RELEASE_TABLET | Freq: Once | ORAL | Status: AC
  Administered 2011-08-11: 40 meq via ORAL
  Filled 2011-08-11: qty 1

## 2011-08-11 MED ORDER — ACETAMINOPHEN 650 MG RE SUPP: 650.0000 mg | Freq: Four times a day (QID) | RECTAL | Status: DC | PRN

## 2011-08-11 MED ORDER — FLUTICASONE-SALMETEROL 100-50 MCG/DOSE IN AEPB
1.0000 | INHALATION_SPRAY | Freq: Two times a day (BID) | RESPIRATORY_TRACT | Status: DC
  Administered 2011-08-11 – 2011-08-13 (×4): 1 via RESPIRATORY_TRACT
  Filled 2011-08-11: qty 14

## 2011-08-11 MED ORDER — INSULIN ASPART 100 UNIT/ML ~~LOC~~ SOLN
0.0000 [IU] | Freq: Every day | SUBCUTANEOUS | Status: DC
  Filled 2011-08-11 (×2): qty 3

## 2011-08-11 MED ORDER — ENOXAPARIN SODIUM 40 MG/0.4ML ~~LOC~~ SOLN: 40.0000 mg | SUBCUTANEOUS | Status: DC

## 2011-08-11 MED ORDER — SIMVASTATIN 40 MG PO TABS
40.0000 mg | ORAL_TABLET | ORAL | Status: AC
  Administered 2011-08-11: 40 mg via ORAL
  Filled 2011-08-11: qty 1

## 2011-08-11 MED ORDER — ENOXAPARIN SODIUM 40 MG/0.4ML ~~LOC~~ SOLN
40.0000 mg | SUBCUTANEOUS | Status: DC
  Administered 2011-08-12 – 2011-08-13 (×2): 40 mg via SUBCUTANEOUS
  Filled 2011-08-11 (×3): qty 0.4

## 2011-08-11 MED ORDER — ACETAMINOPHEN 325 MG PO TABS: 650.0000 mg | ORAL_TABLET | Freq: Four times a day (QID) | ORAL | Status: DC | PRN

## 2011-08-11 MED ORDER — FLUTICASONE-SALMETEROL 100-50 MCG/DOSE IN AEPB: 1.0000 | INHALATION_SPRAY | Freq: Two times a day (BID) | RESPIRATORY_TRACT | Status: DC

## 2011-08-11 MED ORDER — INSULIN ASPART 100 UNIT/ML ~~LOC~~ SOLN
0.0000 [IU] | Freq: Three times a day (TID) | SUBCUTANEOUS | Status: DC
Start: 2011-08-11 — End: 2011-08-13
  Administered 2011-08-12: 1 [IU] via SUBCUTANEOUS

## 2011-08-11 MED ORDER — ENOXAPARIN SODIUM 40 MG/0.4ML ~~LOC~~ SOLN
40.0000 mg | SUBCUTANEOUS | Status: AC
  Administered 2011-08-11: 40 mg via SUBCUTANEOUS
  Filled 2011-08-11: qty 0.4

## 2011-08-11 MED ORDER — MECLIZINE HCL 25 MG PO TABS
25.0000 mg | ORAL_TABLET | Freq: Three times a day (TID) | ORAL | Status: DC
  Administered 2011-08-11 – 2011-08-13 (×7): 25 mg via ORAL
  Filled 2011-08-11 (×13): qty 1

## 2011-08-11 NOTE — ED Notes (Signed)
Report called to Gerarda Gunther, RN on 4W, will transport pt ASAP.

## 2011-08-11 NOTE — ED Provider Notes (Signed)
History     CSN: [id number] Arrival date & time: 08/11/2011  1:40 AM   First MD Initiated Contact with Patient 08/11/11 0244      Chief Complaint  Patient presents with  . Dizziness    The history is provided by the patient.   Dizziness Onset - tonight Course - improving Reports mild headache Associated symptoms - vertigo, denies CP Worsened by - movement Improved by - sitting  Pt reports onset of dizziness/vertigo earlier tonight No cp/sob Reports mild HA No falls/head trauma reported Similar episode earlier this year Denies visual/hearing deficits No LOC reported Past Medical History  Diagnosis Date  . DIABETES MELLITUS, TYPE II 11/03/2009  . HYPERLIPIDEMIA 11/03/2009  . ANXIETY 11/03/2009  . DEPRESSION 11/03/2009  . RESTLESS LEG SYNDROME 11/03/2009  . HYPERTENSION 11/03/2009  . CHRONIC OBSTRUCTIVE PULMONARY DISEASE, ACUTE EXACERBATION 11/03/2009  . EMPHYSEMA, BULLOUS 11/03/2009  . ASTHMA 11/03/2009  . COPD 11/03/2009  . GERD 11/03/2009  . PEPTIC ULCER DISEASE 11/03/2009  . ABSCESS, FINGER 04/07/2010  . ABSCESS 12/03/2009  . DISC DISEASE, LUMBAR 11/03/2009  . SPINAL STENOSIS, LUMBAR 11/03/2009  . RASH-NONVESICULAR 11/03/2009  . NEPHROLITHIASIS, HX OF 11/03/2009    Past Surgical History  Procedure Date  . Rotator cuff repair 2002  . S/p left foot surgury      Morton's Neuroma  . Tonsillectomy     Family History  Problem Relation Age of Onset  . Heart disease Mother   . Cancer Brother     lung    History  Substance Use Topics  . Smoking status: Former Games developer  . Smokeless tobacco: Not on file   Comment: since 1993  . Alcohol Use: No      Review of Systems  All other systems reviewed and are negative.    Allergies  Review of patient's allergies indicates no known allergies.  Home Medications   Current Outpatient Rx  Name Route Sig Dispense Refill  . CITALOPRAM HYDROBROMIDE 10 MG PO TABS Oral Take 1 tablet (10 mg total) by mouth daily. 30 tablet 11    . CLOTRIMAZOLE-BETAMETHASONE 1-0.05 % EX CREA Topical Apply topically 2 (two) times daily.      Marland Kitchen FLUTICASONE-SALMETEROL 100-50 MCG/DOSE IN AEPB Inhalation Inhale 1 puff into the lungs every 12 (twelve) hours.      Marland Kitchen GLUCOSE BLOOD VI STRP Other 1 each by Other route daily. Use as instructed     . GLUCOSE BLOOD VI STRP  Use as instructed 100 each 3  . HYDROCHLOROTHIAZIDE 25 MG PO TABS Oral Take 25 mg by mouth daily.      Marland Kitchen KETOCONAZOLE 200 MG PO TABS Oral Take 200 mg by mouth. 1 by mouth two times a day for 5 days     . LANCETS MISC  Use as directed once daily     . METFORMIN HCL ER (MOD) 500 MG PO TB24 Oral Take 500 mg by mouth QID.      Marland Kitchen OMEPRAZOLE 20 MG PO CPDR Oral Take 1 capsule (20 mg total) by mouth daily. 30 capsule 11  . PRODIGY LANCETS 28G MISC  Use as directed three times daily 100 each 3  . SIMVASTATIN 40 MG PO TABS Oral Take 40 mg by mouth daily.      . SULFAMETHOXAZOLE-TRIMETHOPRIM 800-160 MG PO TABS Oral Take 1 tablet by mouth 2 (two) times daily.        BP 149/84  Pulse 86  Temp(Src) 98.1 F (36.7 C) (Oral)  Resp 16  SpO2 95%  Physical Exam CONSTITUTIONAL: Well developed/well nourished HEAD AND FACE: Normocephalic/atraumatic EYES: EOMI/PERRL, no nystagmus ENMT: Mucous membranes moist NECK: supple no meningeal signs CV: S1/S2 noted, no murmurs/rubs/gallops noted LUNGS: Lungs are clear to auscultation bilaterally, no apparent distress ABDOMEN: soft, nontender, no rebound or guarding NEURO: Awake/alert, facies symmetric, no arm or leg drift is noted Cranial nerves 3/4/5/6/03/27/09/11/12 tested and intact No pastpointing EXTREMITIES: pulses normal, full ROM SKIN: warm, color normal PSYCH: no abnormalities of mood noted  ED Course  Procedures    Labs Reviewed  GLUCOSE, CAPILLARY  CBC  DIFFERENTIAL  BASIC METABOLIC PANEL   1:61 AM Pt feels unsteady on his feet, high risk for fall Will admit D/w dr Kerry Hough, will admit    MDM  Nursing notes reviewed and  considered in documentation Previous records reviewed and considered - pt with extensive workup for dizziness in august ,negative MRI brain, EF appropriate, no signs of significant carotid stenosis All labs/vitals reviewed and considered    Date: 08/11/2011  Rate: 85  Rhythm: normal sinus rhythm  QRS Axis: normal  Intervals: normal  ST/T Wave abnormalities: nonspecific ST changes  Conduction Disutrbances:none  Narrative Interpretation:   Old EKG Reviewed: unchanged          Joya Gaskins, MD 08/11/11 [phone]

## 2011-08-11 NOTE — ED Notes (Signed)
WUJ:[id number]<JY> Expected date:<BR> Expected time:<BR> Means of arrival:<BR> Comments:<BR> EMS-syncopal episode/weak and dizzy

## 2011-08-11 NOTE — ED Notes (Signed)
Patient transported to MRI 

## 2011-08-11 NOTE — ED Notes (Signed)
Report called to Mercy Moore, RN, will transport to 1524 when pt returns from MRI, will monitor.

## 2011-08-11 NOTE — ED Notes (Signed)
Bed:WA01<BR> Expected date:<BR> Expected time:<BR> Means of arrival:<BR> Comments:<BR> closed

## 2011-08-11 NOTE — H&P (Signed)
PCP:  Is in [place]  DOA:  08/11/2011  1:40 AM  Chief Complaint:  Dizziness  HPI: This is a 77 year old gentleman, with history of hypertension, COPD, diabetes, GERD, anxiety/depression, as well as history of cocaine abuse. Patient was previously living [place] for currently resides in [place], [place] with his children. He reports that he is in [place] visiting a friend. The patient reports he was in his usual state of health when last night around midnight he noticed acute onset of vertigo. He reports that the room was spinning. He says this is worse on standing. He denies any numbness, no unilateral weakness. He does report some blurry vision but also reports that he does were glasses and has not worn his glasses today. Patient is somewhat of a poor historian. Patient reports that he had difficulty walking and has had occasional falls since the onset of symptoms. He denies any chest pain. No shortness of breath, no nausea vomiting, he reports his by mouth intake for the past few days has been poor. He is currently residing in a motel. Denies any dysuria or hematuria. No diarrhea. He reports that he is currently not taking any medication. He reports his last use of cocaine was approximately one week ago.  Patient was evaluated in the emergency room. Labs are unremarkable. The patient was recently admitted in August for similar complaints extensive workup at that time including MRI brain, 2-D echo and carotid Dopplers. Currently patient is having difficulty ambulating. He is unsteady on his feet. He has been referred for admission.  Allergies: No Known Allergies  Prior to Admission medications   Medication Sig Start Date End Date Taking? Authorizing Provider  citalopram (CELEXA) 10 MG tablet Take 1 tablet (10 mg total) by mouth daily. 12/23/10 12/23/11  [name], MD  clotrimazole-betamethasone (LOTRISONE) cream Apply topically 2 (two) times daily.      Historical Provider, MD    Fluticasone-Salmeterol (ADVAIR DISKUS) 100-50 MCG/DOSE AEPB Inhale 1 puff into the lungs every 12 (twelve) hours.      Historical Provider, MD  glucose blood (ONE TOUCH ULTRA TEST) test strip 1 each by Other route daily. Use as instructed     Historical Provider, MD  glucose blood (PRODIGY TEST) test strip Use as instructed 04/26/11   [name], MD  hydrochlorothiazide 25 MG tablet Take 25 mg by mouth daily.      Historical Provider, MD  ketoconazole (NIZORAL) 200 MG tablet Take 200 mg by mouth. 1 by mouth two times a day for 5 days     Historical Provider, MD  Lancets MISC Use as directed once daily     Historical Provider, MD  metFORMIN (GLUMETZA) 500 MG (MOD) 24 hr tablet Take 500 mg by mouth QID.      Historical Provider, MD  omeprazole (PRILOSEC) 20 MG capsule Take 1 capsule (20 mg total) by mouth daily. 12/23/10 12/23/11  [name], MD  PRODIGY LANCETS 28G MISC Use as directed three times daily 04/26/11   [name], MD  simvastatin (ZOCOR) 40 MG tablet Take 40 mg by mouth daily.      Historical Provider, MD  sulfamethoxazole-trimethoprim (BACTRIM DS,SEPTRA DS) 800-160 MG per tablet Take 1 tablet by mouth 2 (two) times daily.      Historical Provider, MD    Past Medical History  Diagnosis Date  . DIABETES MELLITUS, TYPE II 11/03/2009  . HYPERLIPIDEMIA 11/03/2009  . ANXIETY 11/03/2009  . DEPRESSION 11/03/2009  . RESTLESS LEG SYNDROME 11/03/2009  . HYPERTENSION  11/03/2009  . CHRONIC OBSTRUCTIVE PULMONARY DISEASE, ACUTE EXACERBATION 11/03/2009  . EMPHYSEMA, BULLOUS 11/03/2009  . ASTHMA 11/03/2009  . COPD 11/03/2009  . GERD 11/03/2009  . PEPTIC ULCER DISEASE 11/03/2009  . ABSCESS, FINGER 04/07/2010  . ABSCESS 12/03/2009  . DISC DISEASE, LUMBAR 11/03/2009  . SPINAL STENOSIS, LUMBAR 11/03/2009  . RASH-NONVESICULAR 11/03/2009  . NEPHROLITHIASIS, HX OF 11/03/2009    Past Surgical History  Procedure Date  . Rotator cuff repair 2002  . S/p left foot surgury      Morton's Neuroma  . Tonsillectomy      Social History:  reports that he has quit smoking. He does not have any smokeless tobacco history on file. He reports that he does not drink alcohol or use illicit drugs.  Family History  Problem Relation Age of Onset  . Heart disease Mother   . Cancer Brother     lung    Review of Systems:  Constitutional: Denies fever, chills, diaphoresis, appetite change. positive fatigue and generalized weakness.Marland Kitchen  HEENT: Denies photophobia, eye pain, redness, hearing loss, ear pain, congestion, sore throat, rhinorrhea, sneezing, mouth sores, trouble swallowing, neck pain, neck stiffness and tinnitus.   Respiratory: Denies SOB, DOE, cough, chest tightness,  and wheezing.   Cardiovascular: Denies chest pain, palpitations and leg swelling.  Gastrointestinal: Denies nausea, vomiting, abdominal pain, diarrhea, constipation, blood in stool and abdominal distention.  Genitourinary: Denies dysuria, urgency, frequency, hematuria, flank pain and difficulty urinating.  Musculoskeletal: Denies myalgias, back pain, joint swelling, arthralgias. Positive for unsteady gait..  Skin: Denies pallor, rash and wound.  Neurological: Denies seizures, syncope, numbness and headaches. Positive for dizziness, lightheadedness, generalized weakness  Hematological: Denies adenopathy. Easy bruising, personal or family bleeding history  Psychiatric/Behavioral: Denies suicidal ideation, mood changes, confusion, nervousness, sleep disturbance and agitation   Physical Exam:  Filed Vitals:   08/11/11 0442 08/11/11 0444 08/11/11 0736 08/11/11 0809  BP: 122/73 142/66 133/78 130/81  Pulse: 92 93 91 84  Temp:      TempSrc:      Resp:   16 14  SpO2:   95% 96%    Constitutional: Vital signs reviewed.  Patient is a well-developed and well-nourished in no acute distress and cooperative with exam. Alert and oriented x3.  Head: Normocephalic and atraumatic Ear: TM normal bilaterally Mouth: no erythema or exudates, MMM Eyes:  PERRL, EOMI, conjunctivae normal, No scleral icterus.  Neck: Supple, Trachea midline normal ROM, No JVD, mass, thyromegaly, or carotid bruit present.  Cardiovascular: RRR, S1 normal, S2 normal, no MRG, pulses symmetric and intact bilaterally Pulmonary/Chest: CTAB, no wheezes, rales, or rhonchi Abdominal: Soft. Non-tender, non-distended, bowel sounds are normal, no masses, organomegaly, or guarding present.  GU: no CVA tenderness Musculoskeletal: No joint deformities, erythema, or stiffness, ROM full and nontender Ext: no edema and no cyanosis, pulses palpable bilaterally (DP and PT) Hematology: no cervical, inginal, or axillary adenopathy.  Neurological: A&O x3, Strenght is normal and symmetric bilaterally, cranial nerve II-XII are grossly intact, no focal motor deficit, sensory intact to light touch bilaterally.  Skin: Warm, dry and intact. No rash, cyanosis, or clubbing.  Psychiatric: Normal mood and affect. speech and behavior is normal. Judgment and thought content normal. Cognition and memory are normal.   Labs on Admission:  Results for orders placed during the hospital encounter of 08/11/11 (from the past 48 hour(s))  GLUCOSE, CAPILLARY     Status: Normal   Collection Time   08/11/11  1:40 AM      Component Value  Range Comment   Glucose-Capillary 87  70 - 99 (mg/dL)   CBC     Status: Normal   Collection Time   08/11/11  3:35 AM      Component Value Range Comment   WBC 8.9  4.0 - 10.5 (K/uL)    RBC 5.02  4.22 - 5.81 (MIL/uL)    Hemoglobin 15.7  13.0 - 17.0 (g/dL)    HCT 16.1  09.6 - 04.5 (%)    MCV 90.6  78.0 - 100.0 (fL)    MCH 31.3  26.0 - 34.0 (pg)    MCHC 34.5  30.0 - 36.0 (g/dL)    RDW 40.9  81.1 - 91.4 (%)    Platelets 266  150 - 400 (K/uL)   DIFFERENTIAL     Status: Normal   Collection Time   08/11/11  3:35 AM      Component Value Range Comment   Neutrophils Relative 66  43 - 77 (%)    Neutro Abs 5.9  1.7 - 7.7 (K/uL)    Lymphocytes Relative 22  12 - 46 (%)     Lymphs Abs 2.0  0.7 - 4.0 (K/uL)    Monocytes Relative 6  3 - 12 (%)    Monocytes Absolute 0.6  0.1 - 1.0 (K/uL)    Eosinophils Relative 4  0 - 5 (%)    Eosinophils Absolute 0.4  0.0 - 0.7 (K/uL)    Basophils Relative 1  0 - 1 (%)    Basophils Absolute 0.1  0.0 - 0.1 (K/uL)   BASIC METABOLIC PANEL     Status: Abnormal   Collection Time   08/11/11  3:35 AM      Component Value Range Comment   Sodium 139  135 - 145 (mEq/L)    Potassium 3.2 (*) 3.5 - 5.1 (mEq/L)    Chloride 102  96 - 112 (mEq/L)    CO2 29  19 - 32 (mEq/L)    Glucose, Bld 96  70 - 99 (mg/dL)    BUN 12  6 - 23 (mg/dL)    Creatinine, Ser 7.82  0.50 - 1.35 (mg/dL)    Calcium 9.6  8.4 - 10.5 (mg/dL)    GFR calc non Af Amer 81 (*) >90 (mL/min)    GFR calc Af Amer >90  >90 (mL/min)   GLUCOSE, CAPILLARY     Status: Abnormal   Collection Time   08/11/11  5:17 AM      Component Value Range Comment   Glucose-Capillary 103 (*) 70 - 99 (mg/dL)     Radiological Exams on Admission: No results found.  Assessment/Plan   Dehydration  Dizziness  Tobacco abuse  Hypokalemia  Plan:  We will admit the patient for observation. Will monitor on telemetry. We will cycle his cardiac markers x3. Since he has recently had a 2-D echocardiogram as well as carotid Dopplers, I did not feel the need to repeat them at this time.  Will also check a urine drug screen as well as a urinalysis and TSH, B12, RPR. We will check orthostatic vitals as well. Patient will be gently hydrated and potassium will be repleted. We will ask physical therapy to see the patient for vestibular training. It is possible that his vertigo may be secondary to benign positional vertigo. We will also start the patient on meclizine. Since the patient reports having some blurry vision we will check an MRI of the brain to rule out any other underlying acute intracranial process. Patient  will be monitored overnight and can likely discharge home tomorrow, if work up is  negative.  Time Spent on Admission:  Shilee Biggs 08/11/2011, 8:39 AM

## 2011-08-11 NOTE — ED Notes (Signed)
Per EMS: pt drove a friend home and become dizzy, weak, headache before leaving. Pt never loss consciousness. A&O x4. Unsteady gait. Denies ETOH use tonight. Pt states similar symptoms in august. cbg 105. 12 lead shows borderline 1st degree block. Denies chest pain, shortness of breath. Denies n/v. No noted fever

## 2011-08-11 NOTE — ED Notes (Signed)
Sandwich, fruit and drink given to pt, awaiting bed placement, will monitor.

## 2011-08-11 NOTE — ED Notes (Signed)
Called admitting MD regarding type of bed, MD wanted Telemetry but was not ordered, Burna Mortimer, Secretary aware with bed type changed. Carney Bern, RN on 5W also informed, awaiting Telemetry bed placement, will monitor.

## 2011-08-11 NOTE — ED Notes (Signed)
Attempted to call report to Gerarda Gunther, RN on 4W, unable to take report, to call this nurse back, will monitor.

## 2011-08-11 NOTE — Progress Notes (Signed)
Page md on call for 2.5 second pause on monitor. Awaiting call back. Patient is asymptomatic at this time. VS stable. Will continue to monitor patient.

## 2011-08-12 LAB — CARDIAC PANEL(CRET KIN+CKTOT+MB+TROPI)
Relative Index: INVALID (ref 0.0–2.5)
Total CK: 25 U/L (ref 7–232)
Troponin I: <0.3 ng/mL (ref <0.30)

## 2011-08-12 LAB — GLUCOSE, CAPILLARY

## 2011-08-12 LAB — HEMOGLOBIN A1C
Hgb A1c MFr Bld: 5.7 % — ABNORMAL HIGH (ref <5.7)
Mean Plasma Glucose: 117 mg/dL — ABNORMAL HIGH (ref <117)

## 2011-08-12 NOTE — Progress Notes (Signed)
08-12-11 Spoke with patient at bedside to discuss dc plans. Pt reports traveling by train to visit his friend for the holiday. Pt is currently staying at [facility]. Pt lives in [place] with his son and son's family. Pt states his insurance has changed to [employer] now. No longer has [employer]. Therefore, he can not f/u with Dr. [name] as PCP any longer. Pt reports not walking in room. Will need PT assessment prior to dc to determine needs. Pt states he may need cane not walker upon dc. Will f/u for therapy recommendations. Reports that his "friend" does not let men stay over nite at her house. Therefore, going to his friend's house before going to [place] is not an option for dc at this time. When pt leaves hospital he states he will go back to the hotel. Will cont to follow. Raiford Noble ,RN,BSN.. [phone].

## 2011-08-12 NOTE — Progress Notes (Signed)
Subjective: No events overnight. Patient is still dizzy on ambulation. He was seen by physical therapy. Orthostatic vital signs are negative. Thus far has been unremarkable. Reports some improvement compared to admission after being started on meclizine. Objective:  Vital signs in last 24 hours:  Filed Vitals:   08/12/11 0541 08/12/11 1100 08/12/11 1249 08/12/11 1252  BP: 124/67  154/85 154/85  Pulse: 83  77 79  Temp: 98.3 F (36.8 C)   97.6 F (36.4 C)  TempSrc: Oral   Oral  Resp: 18   18  Height:      Weight:      SpO2: 94% 95% 95% 97%    Intake/Output from previous day:   Intake/Output Summary (Last 24 hours) at 08/12/11 1905 Last data filed at 08/12/11 1823  Gross per 24 hour  Intake   1380 ml  Output    375 ml  Net   1005 ml    Physical Exam: General: Alert, awake, oriented x3, in no acute distress. HEENT: No bruits, no goiter. Moist mucous membranes, no scleral icterus, no conjunctival pallor. Heart: Regular rate and rhythm, without murmurs, rubs, gallops. Lungs: Clear to auscultation bilaterally. No wheezing, no rhonchi, no rales.  Abdomen: Soft, nontender, nondistended, positive bowel sounds. Extremities: No clubbing cyanosis or edema,  positive pedal pulses. Neuro: Grossly intact, nonfocal.    Lab Results:  Basic Metabolic Panel:    Component Value Date/Time   NA 139 08/11/2011 0335   K 3.2* 08/11/2011 0335   CL 102 08/11/2011 0335   CO2 29 08/11/2011 0335   BUN 12 08/11/2011 0335   CREATININE 1.20 08/11/2011 2004   GLUCOSE 96 08/11/2011 0335   CALCIUM 9.6 08/11/2011 0335   CBC:    Component Value Date/Time   WBC 8.8 08/11/2011 2004   HGB 14.4 08/11/2011 2004   HCT 43.1 08/11/2011 2004   PLT 260 08/11/2011 2004   MCV 91.5 08/11/2011 2004   NEUTROABS 5.9 08/11/2011 0335   LYMPHSABS 2.0 08/11/2011 0335   MONOABS 0.6 08/11/2011 0335   EOSABS 0.4 08/11/2011 0335   BASOSABS 0.1 08/11/2011 0335      Lab 08/11/11 2004 08/11/11 0335  WBC 8.8  8.9  HGB 14.4 15.7  HCT 43.1 45.5  PLT 260 266  MCV 91.5 90.6  MCH 30.6 31.3  MCHC 33.4 34.5  RDW 13.6 13.8  LYMPHSABS -- 2.0  MONOABS -- 0.6  EOSABS -- 0.4  BASOSABS -- 0.1  BANDABS -- --    Lab 08/11/11 2004 08/11/11 0335  NA -- 139  K -- 3.2*  CL -- 102  CO2 -- 29  GLUCOSE -- 96  BUN -- 12  CREATININE 1.20 0.90  CALCIUM -- 9.6  MG -- --   No results found for this basename: INR:5,PROTIME:5 in the last 168 hours Cardiac markers:  Lab 08/12/11 0756 08/12/11 0020 08/11/11 1600  CKMB 1.4 1.4 1.6  TROPONINI <0.30 <0.30 <0.30  MYOGLOBIN -- -- --   No results found for this basename: POCBNP:3 in the last 168 hours No results found for this or any previous visit (from the past 240 hour(s)).  Studies/Results: Ct Head Wo Contrast  08/11/2011  *RADIOLOGY REPORT*  Clinical Data: Dizziness, headache.  CT HEAD WITHOUT CONTRAST  Technique:  Contiguous axial images were obtained from the base of the skull through the vertex without contrast.  Comparison: 04/23/2011 MRI  Findings: Periventricular and subcortical white matter hypodensities are most in keeping with chronic microangiopathic change. There is no evidence for  acute hemorrhage, hydrocephalus, mass lesion, or abnormal extra-axial fluid collection.  No definite CT evidence for acute infarction.  The visualized paranasal sinuses and mastoid air cells are predominately clear.  IMPRESSION: Mild white matter hypodensities.  No acute intracranial abnormality.  Original Report Authenticated By: Waneta Martins, M.D.   Mr Brain 48 Contrast  08/11/2011  *RADIOLOGY REPORT*  Clinical Data: 77 year old male with vertigo, dizziness, headache, possible stroke.  MRI HEAD WITHOUT CONTRAST  Technique:  Multiplanar, multiecho pulse sequences of the brain and surrounding structures were obtained according to standard protocol without intravenous contrast.  Comparison: Head CT 08/11/2011.  Brain MRI 04/23/2011 and earlier.  Findings: No  restricted diffusion to suggest acute infarction.  No midline shift, mass effect, evidence of mass lesion, ventriculomegaly, extra-axial collection or acute intracranial hemorrhage.  Cervicomedullary junction and pituitary are within normal limits.  Major intracranial vascular flow voids are preserved with mild intracranial artery dolichoectasia.  Visualized internal auditory structures are grossly normal.  Stable mild for age nonspecific scattered T2 and FLAIR hyperintensity.  Negative for age visualized cervical spine.  Visualized orbit soft tissues are within normal limits.  Mild chronic paranasal sinus mucosal thickening has mildly increased. Mastoids remain clear. Visualized bone marrow signal is within normal limits.  Visualized scalp soft tissues are within normal limits.  IMPRESSION: No acute intracranial abnormality.  Stable noncontrast MRI appearance of the brain with mild nonspecific signal changes.  Original Report Authenticated By: Harley Hallmark, M.D.    Medications: Scheduled Meds:   . citalopram  10 mg Oral To ER  . citalopram  10 mg Oral Daily  . enoxaparin  40 mg Subcutaneous Q24H  . Fluticasone-Salmeterol  1 puff Inhalation BID  . insulin aspart  0-5 Units Subcutaneous QHS  . insulin aspart  0-9 Units Subcutaneous TID WC  . meclizine  25 mg Oral TID  . pantoprazole  40 mg Oral Q1200  . simvastatin  40 mg Oral q1800   Continuous Infusions:   . 0.9 % NaCl with KCl 20 mEq / L 75 mL/hr at 08/12/11 1500   PRN Meds:.acetaminophen, acetaminophen, ondansetron (ZOFRAN) IV, ondansetron  Assessment/Plan:  Active Problems:  Dehydration  Dizziness  Tobacco abuse   Plan:  Patient's dehydration has resolved, orthostatic vital signs are negative. We will discontinue IV fluids.  Regarding his vertigo, MRI of the brain was done to be negative for any acute process. His urine does show any signs of infection. His orthostatic vital signs are negative. We will check B12 and RPR he was  seen by physical therapy and recommended a rolling walker. He'll likely be discharged home in the morning in the care of his friend. His plan is to eventually return to [place] to his family. He reports that until that time he'll be under the care of his friend who will be supervising him.   LOS: 1 day   Frederic Tones 08/12/2011, 7:05 PM

## 2011-08-12 NOTE — Progress Notes (Signed)
Physical Therapy Evaluation Patient Details Name: [name] [name] MRN: [id number] DOB: 1934/03/11 Today's Date: 08/12/2011 Time: 4098-1191 Charge: EVII  Problem List:  Patient Active Problem List  Diagnoses  . DIABETES MELLITUS, TYPE II  . HYPERLIPIDEMIA  . ANXIETY  . DEPRESSION  . RESTLESS LEG SYNDROME  . HYPERTENSION  . EMPHYSEMA, BULLOUS  . ASTHMA  . COPD  . GERD  . PEPTIC ULCER DISEASE  . DISC DISEASE, LUMBAR  . SPINAL STENOSIS, LUMBAR  . NEPHROLITHIASIS, HX OF  . Orthostasis  . Dehydration  . Abdominal  pain, other specified site  . Dizziness  . Weight loss  . Left lumbar radiculopathy  . Cocaine abuse  . Tobacco abuse    Past Medical History:  Past Medical History  Diagnosis Date  . DIABETES MELLITUS, TYPE II 11/03/2009  . HYPERLIPIDEMIA 11/03/2009  . ANXIETY 11/03/2009  . DEPRESSION 11/03/2009  . RESTLESS LEG SYNDROME 11/03/2009  . HYPERTENSION 11/03/2009  . CHRONIC OBSTRUCTIVE PULMONARY DISEASE, ACUTE EXACERBATION 11/03/2009  . EMPHYSEMA, BULLOUS 11/03/2009  . ASTHMA 11/03/2009  . COPD 11/03/2009  . GERD 11/03/2009  . PEPTIC ULCER DISEASE 11/03/2009  . ABSCESS, FINGER 04/07/2010  . ABSCESS 12/03/2009  . DISC DISEASE, LUMBAR 11/03/2009  . SPINAL STENOSIS, LUMBAR 11/03/2009  . RASH-NONVESICULAR 11/03/2009  . NEPHROLITHIASIS, HX OF 11/03/2009   Past Surgical History:  Past Surgical History  Procedure Date  . Rotator cuff repair 2002  . S/p left foot surgury      Morton's Neuroma  . Tonsillectomy     PT Assessment/Plan/Recommendation PT Assessment Clinical Impression Statement: Pt presents with increased dizziness with ambulation requiring assist for balance.  Pt would benefit from skilled PT services to improve independence and safety with mobility and decrease dizziness with ambulation for safe D/C to next level of care. PT Recommendation/Assessment: Patient will need skilled PT in the acute care venue PT Problem List: Decreased activity tolerance;Decreased  balance;Decreased mobility;Decreased safety awareness;Decreased knowledge of use of DME PT Therapy Diagnosis : Difficulty walking PT Plan PT Frequency: Min 3X/week PT Treatment/Interventions: DME instruction;Gait training;Stair training;Functional mobility training;Therapeutic exercise;Balance training;Neuromuscular re-education;Patient/family education (Brandt-Daroff exercises) PT Recommendation Follow Up Recommendations: 24 hour supervision/assistance;Home health PT (F/U PT TBA) Equipment Recommended: Rolling walker with 5" wheels Vestibular eval: Pt reports dizziness started approximately 2 days ago while trying to get into a car.  Pt reports similar sensation occurred this past September and MDs told him that it was related to his heart.  Pt describes dizziness as a spinning sensation that varies in length but can last longer than 5 minutes.  Pt reports spinning is worst when standing.  Pt did not get dizzy with bed mobility or sit to stand.  Pt had no spontaneous nystagmus.  Performed Dix-Hallpike on Right and Left, and pt reported dizziness (no spinning though) although no nystagmus present.  Pt does not present with signs/symptoms consistent with BPPV at this time.  PT Goals  Acute Rehab PT Goals PT Goal Formulation: With patient Time For Goal Achievement: 7 days Pt will Transfer Sit to Stand/Stand to Sit: with modified independence PT Transfer Goal: Sit to Stand/Stand to Sit - Progress: Progressing toward goal Pt will Ambulate: >150 feet;with modified independence;with least restrictive assistive device PT Goal: Ambulate - Progress: Progressing toward goal Pt will Go Up / Down Stairs: 3-5 stairs;with modified independence;with rail(s) PT Goal: Up/Down Stairs - Progress: Other (comment) Pt will Perform Home Exercise Program: with supervision, verbal cues required/provided PT Goal: Perform Home Exercise Program - Progress: Other (comment) (  including Brandt-Daroff) Additional  Goals Additional Goal #1: Pt will perform above mobility goals with less than or equal to 2/10 dizziness. PT Goal: Additional Goal #1 - Progress: Other (comment)  PT Evaluation Precautions/Restrictions  Precautions Precautions: Fall Prior Functioning  Home Living Lives With: Son Type of Home: House Home Adaptive Equipment: None Additional Comments: Pt from [place] where he lives with his son, and he is here visiting friends and staying in a hotel. Prior Function Level of Independence: Independent with basic ADLs;Independent with gait Comments: Pt took Amtrak from [place] to [place]. Cognition Cognition Arousal/Alertness: Awake/alert Overall Cognitive Status: Appears within functional limits for tasks assessed Orientation Level: Oriented X4 Sensation/Coordination   Extremity Assessment RLE Assessment RLE Assessment: Within Functional Limits LLE Assessment LLE Assessment: Within Functional Limits Mobility (including Balance) Bed Mobility Bed Mobility: Yes Supine to Sit: 7: Independent Transfers Transfers: Yes Sit to Stand: 4: Min assist Sit to Stand Details (indicate cue type and reason): min/guard Stand to Sit: 4: Min assist;With upper extremity assist Stand to Sit Details: assist to sit 2* dizziness Stand Pivot Transfers: 4: Min assist Stand Pivot Transfer Details (indicate cue type and reason): verbal cues to use UEs to assist 2* pt dizziness Ambulation/Gait Ambulation/Gait: Yes Ambulation/Gait Assistance: 4: Min assist Ambulation/Gait Assistance Details (indicate cue type and reason): Pt ambulated 80 feet without assistive device and then became dizzy requiring min assist for balance and safety to chair.  nursing tech brought pt's recliner and pt assisted into recliner and then vitals taken (BP 154/85)  Pt started ambulation without use of UEs and then closer to 70 feet started reaching for rail then required assist for balance. Pt reported feeling better approx 2  minutes after sitting. Ambulation Distance (Feet): 80 Feet Assistive device: None Gait Pattern:  (wide BOS) Gait velocity: varying cadence  Balance Balance Assessed:  (see Pt Instruction section for vestibular related testing.) Exercise    End of Session PT - End of Session Activity Tolerance:  (limited by dizziness) Patient left: in chair;with call bell in reach General Behavior During Session: [facility] for tasks performed Cognition: [facility] for tasks performed  Journey Castonguay,KATHrine E 08/12/2011, 2:34 PM Pager: [phone]

## 2011-08-12 NOTE — Progress Notes (Signed)
08/12/11 Spoke with patient at bedside after speaking with MD and reviewing therapy notes. Pt states he thinks his "friend" can stay with him at the hotel upon dc. Pt not interested in going to snf. States he is going back to [place] a week from today regardless. Agreeable to rolling walker but states he does not think he needs HHC PT at dc. Lucretia with [facility] made aware of referral for rolling walker. Text paged MD pt's dc plan. Raiford Noble, RN, CM. [phone]

## 2011-08-12 NOTE — Patient Instructions (Signed)
Vestibular eval: Pt reports dizziness started approximately 2 days ago while trying to get into a car.  Pt reports similar sensation occurred this past September and MDs told him that it was related to his heart.  Pt describes dizziness as a spinning sensation that varies in length but can last longer than 5 minutes.  Pt reports spinning is worst when standing.  Pt did not get dizzy with bed mobility or sit to stand.  Pt had no spontaneous nystagmus.  Performed Dix-Hallpike on Right and Left, and pt reported dizziness (no spinning though) although no nystagmus present.  Pt does not present with signs/symptoms consistent with BPPV at this time.

## 2011-08-13 LAB — BASIC METABOLIC PANEL
Calcium: 9.3 mg/dL (ref 8.4–10.5)
Creatinine, Ser: 1.02 mg/dL (ref 0.50–1.35)
GFR calc non Af Amer: 70 mL/min — ABNORMAL LOW (ref >90)
Glucose, Bld: 94 mg/dL (ref 70–99)
Sodium: 136 mEq/L (ref 135–145)

## 2011-08-13 LAB — GLUCOSE, CAPILLARY: Glucose-Capillary: 102 mg/dL — ABNORMAL HIGH (ref 70–99)

## 2011-08-13 LAB — VITAMIN B12: Vitamin B-12: 370 pg/mL (ref 211–911)

## 2011-08-13 LAB — RPR: RPR Ser Ql: NONREACTIVE

## 2011-08-13 MED ORDER — FLUTICASONE-SALMETEROL 100-50 MCG/DOSE IN AEPB: 1.0000 | INHALATION_SPRAY | Freq: Two times a day (BID) | RESPIRATORY_TRACT | Status: DC

## 2011-08-13 MED ORDER — METFORMIN HCL ER (MOD) 500 MG PO TB24: 1000.0000 mg | ORAL_TABLET | Freq: Every day | ORAL | Status: DC

## 2011-08-13 MED ORDER — CITALOPRAM HYDROBROMIDE 10 MG PO TABS: 10.0000 mg | ORAL_TABLET | Freq: Every day | ORAL | Status: DC

## 2011-08-13 MED ORDER — AMLODIPINE BESYLATE 5 MG PO TABS: 5.0000 mg | ORAL_TABLET | Freq: Every day | ORAL | Status: DC

## 2011-08-13 MED ORDER — KETOCONAZOLE 200 MG PO TABS: 200.0000 mg | ORAL_TABLET | Freq: Two times a day (BID) | ORAL | Status: DC

## 2011-08-13 MED ORDER — MECLIZINE HCL 25 MG PO TABS: 25.0000 mg | ORAL_TABLET | Freq: Three times a day (TID) | ORAL | Status: AC

## 2011-08-13 MED ORDER — SIMVASTATIN 40 MG PO TABS: 40.0000 mg | ORAL_TABLET | Freq: Every day | ORAL | Status: DC

## 2011-08-13 MED ORDER — CYANOCOBALAMIN 250 MCG PO TABS: 250.0000 ug | ORAL_TABLET | Freq: Every day | ORAL | Status: DC

## 2011-08-13 MED ORDER — CLOTRIMAZOLE-BETAMETHASONE 1-0.05 % EX CREA: TOPICAL_CREAM | Freq: Two times a day (BID) | CUTANEOUS | Status: DC

## 2011-08-13 MED ORDER — OMEPRAZOLE 20 MG PO CPDR: 20.0000 mg | DELAYED_RELEASE_CAPSULE | Freq: Every day | ORAL | Status: DC

## 2011-08-13 NOTE — Discharge Summary (Signed)
[name] MRN: [id number] DOB/AGE: 03/25/1934 77 y.o.  Admit date: 08/11/2011 Discharge date: 08/13/2011  Primary Care Physician:  Was previously [name] Barre, MD of [facility], now patient lives in [place].  Discharge Diagnoses:   Vertigo .Dizziness .Dehydration .Tobacco abuse Low Vitamin B12 level Cocaine abuse COPD Hypertension NIDDM    Current Discharge Medication List    START taking these medications   Details  amLODipine (NORVASC) 5 MG tablet Take 1 tablet (5 mg total) by mouth daily. Qty: 30 tablet, Refills: 0    meclizine (ANTIVERT) 25 MG tablet Take 1 tablet (25 mg total) by mouth 3 (three) times daily. Qty: 90 tablet, Refills: 1    vitamin B-12 (CYANOCOBALAMIN) 250 MCG tablet Take 1 tablet (250 mcg total) by mouth daily. Qty: 30 tablet, Refills: 0      CONTINUE these medications which have CHANGED   Details  citalopram (CELEXA) 10 MG tablet Take 1 tablet (10 mg total) by mouth daily. Qty: 30 tablet, Refills: 0   Associated Diagnoses: Depressive disorder, not elsewhere classified    clotrimazole-betamethasone (LOTRISONE) cream Apply topically 2 (two) times daily. Qty: 30 g, Refills: 0    Fluticasone-Salmeterol (ADVAIR DISKUS) 100-50 MCG/DOSE AEPB Inhale 1 puff into the lungs every 12 (twelve) hours. Qty: 60 each, Refills: 0    metFORMIN (GLUMETZA) 500 MG (MOD) 24 hr tablet Take 2 tablets (1,000 mg total) by mouth daily. Qty: 60 tablet, Refills: 0    omeprazole (PRILOSEC) 20 MG capsule Take 1 capsule (20 mg total) by mouth daily. Qty: 30 capsule, Refills: 0   Associated Diagnoses: Abdominal  pain, other specified site    simvastatin (ZOCOR) 40 MG tablet Take 1 tablet (40 mg total) by mouth daily. Qty: 30 tablet, Refills: 0      CONTINUE these medications which have NOT CHANGED   Details  glucose blood (PRODIGY TEST) test strip Use as instructed Qty: 100 each, Refills: 3    ketoconazole (NIZORAL) 200 MG tablet Take 200 mg by mouth. 1  by mouth two times a day for 5 days     Lancets MISC Use as directed once daily       STOP taking these medications     hydrochlorothiazide 25 MG tablet      sulfamethoxazole-trimethoprim (BACTRIM DS,SEPTRA DS) 800-160 MG per tablet         Disposition and Follow-up: Follow up with primary doctor once returned to [place]  Consults:  None.  Significant Diagnostic Studies:  Ct Head Wo Contrast  08/11/2011  *RADIOLOGY REPORT*  Clinical Data: Dizziness, headache.  CT HEAD WITHOUT CONTRAST  Technique:  Contiguous axial images were obtained from the base of the skull through the vertex without contrast.  Comparison: 04/23/2011 MRI  Findings: Periventricular and subcortical white matter hypodensities are most in keeping with chronic microangiopathic change. There is no evidence for acute hemorrhage, hydrocephalus, mass lesion, or abnormal extra-axial fluid collection.  No definite CT evidence for acute infarction.  The visualized paranasal sinuses and mastoid air cells are predominately clear.  IMPRESSION: Mild white matter hypodensities.  No acute intracranial abnormality.  Original Report Authenticated By: Waneta Martins, M.D.   Mr Brain 65 Contrast  08/11/2011  *RADIOLOGY REPORT*  Clinical Data: 77 year old male with vertigo, dizziness, headache, possible stroke.  MRI HEAD WITHOUT CONTRAST  Technique:  Multiplanar, multiecho pulse sequences of the brain and surrounding structures were obtained according to standard protocol without intravenous contrast.  Comparison: Head CT 08/11/2011.  Brain MRI 04/23/2011 and earlier.  Findings: No restricted diffusion to suggest acute infarction.  No midline shift, mass effect, evidence of mass lesion, ventriculomegaly, extra-axial collection or acute intracranial hemorrhage.  Cervicomedullary junction and pituitary are within normal limits.  Major intracranial vascular flow voids are preserved with mild intracranial artery dolichoectasia.  Visualized  internal auditory structures are grossly normal.  Stable mild for age nonspecific scattered T2 and FLAIR hyperintensity.  Negative for age visualized cervical spine.  Visualized orbit soft tissues are within normal limits.  Mild chronic paranasal sinus mucosal thickening has mildly increased. Mastoids remain clear. Visualized bone marrow signal is within normal limits.  Visualized scalp soft tissues are within normal limits.  IMPRESSION: No acute intracranial abnormality.  Stable noncontrast MRI appearance of the brain with mild nonspecific signal changes.  Original Report Authenticated By: Harley Hallmark, M.D.    Brief H and P: For complete details please refer to admission H and P, but in brief This is a 77 year old gentleman, with history of hypertension, COPD, diabetes, GERD, anxiety/depression, as well as history of cocaine abuse. Patient was previously living [place] for currently resides in [place], [place] with his children. He reports that he is in [place] visiting a friend. The patient reports he was in his usual state of health when last night around midnight he noticed acute onset of vertigo. He reports that the room was spinning. He says this is worse on standing. He denies any numbness, no unilateral weakness. He does report some blurry vision but also reports that he does were glasses and has not worn his glasses today. Patient is somewhat of a poor historian. Patient reports that he had difficulty walking and has had occasional falls since the onset of symptoms. He denies any chest pain. No shortness of breath, no nausea vomiting, he reports his by mouth intake for the past few days has been poor. He is currently residing in a motel. Denies any dysuria or hematuria. No diarrhea. He reports that he is currently not taking any medication. He reports his last use of cocaine was approximately one week ago.  Patient was evaluated in the emergency room. Labs are unremarkable. The patient was  recently admitted in August for similar complaints extensive workup at that time including MRI brain, 2-D echo and carotid Dopplers. Currently patient is having difficulty ambulating. He is unsteady on his feet. He has been referred for admission.   Physical Exam on Discharge:  Filed Vitals:   08/12/11 1252 08/12/11 2042 08/13/11 0610 08/13/11 0832  BP: 154/85 132/69 130/78 129/59  Pulse: 79 88 81 84  Temp: 97.6 F (36.4 C) 98.1 F (36.7 C) 97.8 F (36.6 C)   TempSrc: Oral Oral Oral   Resp: 18 18 18 18   Height:      Weight:      SpO2: 97% 96% 95% 97%     Intake/Output Summary (Last 24 hours) at 08/13/11 1357 Last data filed at 08/13/11 0900  Gross per 24 hour  Intake   1980 ml  Output      0 ml  Net   1980 ml    General: Alert, awake, oriented x3, in no acute distress. HEENT: No bruits, no goiter. Heart: Regular rate and rhythm, without murmurs, rubs, gallops. Lungs: Clear to auscultation bilaterally. Abdomen: Soft, nontender, nondistended, positive bowel sounds. Extremities: No clubbing cyanosis or edema with positive pedal pulses. Neuro: Grossly intact, nonfocal.  CBC:    Component Value Date/Time   WBC 8.8 08/11/2011 2004   HGB 14.4 08/11/2011  2004   HCT 43.1 08/11/2011 2004   PLT 260 08/11/2011 2004   MCV 91.5 08/11/2011 2004   NEUTROABS 5.9 08/11/2011 0335   LYMPHSABS 2.0 08/11/2011 0335   MONOABS 0.6 08/11/2011 0335   EOSABS 0.4 08/11/2011 0335   BASOSABS 0.1 08/11/2011 0335    Basic Metabolic Panel:    Component Value Date/Time   NA 136 08/13/2011 0551   K 3.7 08/13/2011 0551   CL 101 08/13/2011 0551   CO2 27 08/13/2011 0551   BUN 13 08/13/2011 0551   CREATININE 1.02 08/13/2011 0551   GLUCOSE 94 08/13/2011 0551   CALCIUM 9.3 08/13/2011 0551    Hospital Course:  Active Problems:  Dehydration  Dizziness  Tobacco abuse  Patient had an extensive work up for vertigo.  MRI of the brain did not show acute findings.  Patient had recently had a 2D  echo and carotid dopplers done within the past 3 months. Telemetry was not revealing.  Patient was not orthostatic.  TSH, RPR were also normal.  B12 level, although still in normal range, were on low side.  He will be started on replacement. He was seen by physical therapy and rolling walker was recommended.  Pt says he will have 24 hour assistance with his friend that he is staying with.  He plans on returning to [place] within the next week to return to his children. He was started on meclizine with modest improvement with his symptoms.  We will plan on discharging him today.  He was counseled extensively on the importance from abstaining from cocaine or other recreational drug abuse.  Time spent on Discharge:  Signed: Shirla Hodgkiss 08/13/2011, 1:57 PM

## 2011-08-13 NOTE — Progress Notes (Signed)
Pt episode of bigemany at 0825.  Pt had similar episode 08/12/11 at 0943.   Pt denies chest pain or SOB.

## 2011-08-13 NOTE — Progress Notes (Signed)
Cm spoke with pt concerning d/c needs. Per MD order pt to d/c with RW. Per pt choice [facility] to deliver RW to room prior to discharge. Pt visiting from [place]. Has planns to return to [place] next Friday where he will live with his adult son.

## 2011-09-12 ENCOUNTER — Emergency Department (HOSPITAL_COMMUNITY)
Admission: EM | Admit: 2011-09-12 | Discharge: 2011-09-13 | Disposition: A | Discharge status: Home / Self Care | Payer: Medicare (Managed Care) | Attending: Emergency Medicine | Admitting: Emergency Medicine

## 2011-09-12 ENCOUNTER — Encounter (HOSPITAL_COMMUNITY): Payer: Self-pay | Admitting: Emergency Medicine

## 2011-09-12 ENCOUNTER — Emergency Department (HOSPITAL_COMMUNITY): Payer: Medicare (Managed Care)

## 2011-09-12 DIAGNOSIS — R109 Unspecified abdominal pain: Secondary | ICD-10-CM | POA: Insufficient documentation

## 2011-09-12 DIAGNOSIS — M51379 Other intervertebral disc degeneration, lumbosacral region without mention of lumbar back pain or lower extremity pain: Secondary | ICD-10-CM | POA: Insufficient documentation

## 2011-09-12 DIAGNOSIS — E119 Type 2 diabetes mellitus without complications: Secondary | ICD-10-CM | POA: Insufficient documentation

## 2011-09-12 DIAGNOSIS — K5730 Diverticulosis of large intestine without perforation or abscess without bleeding: Secondary | ICD-10-CM | POA: Insufficient documentation

## 2011-09-12 DIAGNOSIS — N200 Calculus of kidney: Secondary | ICD-10-CM | POA: Insufficient documentation

## 2011-09-12 DIAGNOSIS — K4090 Unilateral inguinal hernia, without obstruction or gangrene, not specified as recurrent: Secondary | ICD-10-CM | POA: Insufficient documentation

## 2011-09-12 DIAGNOSIS — M5137 Other intervertebral disc degeneration, lumbosacral region: Secondary | ICD-10-CM | POA: Insufficient documentation

## 2011-09-12 DIAGNOSIS — I10 Essential (primary) hypertension: Secondary | ICD-10-CM | POA: Insufficient documentation

## 2011-09-12 LAB — COMPREHENSIVE METABOLIC PANEL
ALT: 16 U/L (ref 0–53)
BUN: 15 mg/dL (ref 6–23)
CO2: 28 mEq/L (ref 19–32)
Calcium: 9.3 mg/dL (ref 8.4–10.5)
Creatinine, Ser: 0.89 mg/dL (ref 0.50–1.35)
GFR calc Af Amer: >90 mL/min (ref >90)
GFR calc non Af Amer: 82 mL/min — ABNORMAL LOW (ref >90)
Glucose, Bld: 91 mg/dL (ref 70–99)
Total Protein: 6.7 g/dL (ref 6.0–8.3)

## 2011-09-12 LAB — URINALYSIS, ROUTINE W REFLEX MICROSCOPIC
Hgb urine dipstick: NEGATIVE
Leukocytes, UA: NEGATIVE
Protein, ur: NEGATIVE mg/dL
Specific Gravity, Urine: 1.034 — ABNORMAL HIGH (ref 1.005–1.030)
Urobilinogen, UA: 0.2 mg/dL (ref 0.0–1.0)

## 2011-09-12 LAB — CBC
HCT: 45.6 % (ref 39.0–52.0)
Hemoglobin: 15.7 g/dL (ref 13.0–17.0)
MCHC: 34.4 g/dL (ref 30.0–36.0)
WBC: 9.8 10*3/uL (ref 4.0–10.5)

## 2011-09-12 LAB — DIFFERENTIAL
Basophils Absolute: 0.1 10*3/uL (ref 0.0–0.1)
Basophils Relative: 1 % (ref 0–1)
Lymphocytes Relative: 22 % (ref 12–46)
Monocytes Absolute: 0.6 10*3/uL (ref 0.1–1.0)
Monocytes Relative: 6 % (ref 3–12)
Neutro Abs: 6.7 10*3/uL (ref 1.7–7.7)
Neutrophils Relative %: 68 % (ref 43–77)

## 2011-09-12 LAB — GLUCOSE, CAPILLARY: Glucose-Capillary: 85 mg/dL (ref 70–99)

## 2011-09-12 MED ORDER — SODIUM CHLORIDE 0.9 % IV SOLN
999.0000 mL | Freq: Once | INTRAVENOUS | Status: AC
  Administered 2011-09-12: 999 mL via INTRAVENOUS

## 2011-09-12 MED ORDER — IOHEXOL 300 MG/ML  SOLN
100.0000 mL | Freq: Once | INTRAMUSCULAR | Status: AC | PRN
  Administered 2011-09-12: 100 mL via INTRAVENOUS

## 2011-09-12 MED ORDER — OXYCODONE-ACETAMINOPHEN 5-325 MG PO TABS: 1.0000 | ORAL_TABLET | ORAL | Status: AC | PRN

## 2011-09-12 MED ORDER — HYDROMORPHONE HCL PF 1 MG/ML IJ SOLN
0.5000 mg | Freq: Once | INTRAMUSCULAR | Status: AC
  Administered 2011-09-12: 0.5 mg via INTRAVENOUS
  Filled 2011-09-12: qty 1

## 2011-09-12 NOTE — ED Notes (Signed)
Patient transported to CT 

## 2011-09-12 NOTE — ED Notes (Signed)
Pt ambulated to bathroom with no assistance. Pt had to use call bell in bathroom to ask for assistance with getting back to room. Pt not able to walk without assistance. Pt staggered into hall and stated "feeling wobbly". RN Alinda Money notified.

## 2011-09-12 NOTE — ED Notes (Signed)
As per EMS pt c/o abd pain in groin area 7 out of 10. Hx hernia

## 2011-09-12 NOTE — ED Notes (Signed)
ZOX:[id number]<AV> Expected date:09/12/11<BR> Expected time: 7:45 PM<BR> Means of arrival:Ambulance<BR> Comments:<BR> EMS 261 GC, 77 yom hernia pain

## 2011-09-12 NOTE — ED Provider Notes (Signed)
History     CSN: [id number]  Arrival date & time 09/12/11  4098   First MD Initiated Contact with Patient 09/12/11 2007      Chief Complaint  Patient presents with  . Abdominal Pain  . Nausea    (Consider location/radiation/quality/duration/timing/severity/associated sxs/prior treatment) HPI The patient presents with persistent left inguinal pain. He notes that he has had pain for the past 2 years, and it has become worse over the past 3 weeks. The pain is focally about the left inguinal crease, sharp, crampy. The pain is nonradiating. No alleviation with position, no attempts at medication. The pain is worse with defecation, though the patient notes no changes in bowel movement patterns. No fevers, no chills, no nausea, vomiting, no by mouth intolerance  Past Medical History  Diagnosis Date  . DIABETES MELLITUS, TYPE II 11/03/2009  . HYPERLIPIDEMIA 11/03/2009  . ANXIETY 11/03/2009  . DEPRESSION 11/03/2009  . RESTLESS LEG SYNDROME 11/03/2009  . HYPERTENSION 11/03/2009  . CHRONIC OBSTRUCTIVE PULMONARY DISEASE, ACUTE EXACERBATION 11/03/2009  . EMPHYSEMA, BULLOUS 11/03/2009  . ASTHMA 11/03/2009  . COPD 11/03/2009  . GERD 11/03/2009  . PEPTIC ULCER DISEASE 11/03/2009  . ABSCESS, FINGER 04/07/2010  . ABSCESS 12/03/2009  . DISC DISEASE, LUMBAR 11/03/2009  . SPINAL STENOSIS, LUMBAR 11/03/2009  . RASH-NONVESICULAR 11/03/2009  . NEPHROLITHIASIS, HX OF 11/03/2009    Past Surgical History  Procedure Date  . Rotator cuff repair 2002  . S/p left foot surgury      Morton's Neuroma  . Tonsillectomy     Family History  Problem Relation Age of Onset  . Heart disease Mother   . Cancer Brother     lung    History  Substance Use Topics  . Smoking status: Former Games developer  . Smokeless tobacco: Not on file   Comment: since 1993  . Alcohol Use: No      Review of Systems  Constitutional:       Per HPI, otherwise negative  HENT:       Per HPI, otherwise negative  Eyes: Negative.     Respiratory:       Per HPI, otherwise negative  Cardiovascular:       Per HPI, otherwise negative  Gastrointestinal: Negative for vomiting.  Genitourinary: Negative.   Musculoskeletal:       Per HPI, otherwise negative  Skin: Negative.   Neurological: Negative for syncope.    Allergies  Review of patient's allergies indicates no known allergies.  Home Medications   Current Outpatient Rx  Name Route Sig Dispense Refill  . AMLODIPINE BESYLATE 5 MG PO TABS Oral Take 1 tablet (5 mg total) by mouth daily. 30 tablet 0  . CITALOPRAM HYDROBROMIDE 10 MG PO TABS Oral Take 1 tablet (10 mg total) by mouth daily. 30 tablet 0  . CLOTRIMAZOLE-BETAMETHASONE 1-0.05 % EX CREA Topical Apply topically 2 (two) times daily. 30 g 0  . FLUTICASONE-SALMETEROL 100-50 MCG/DOSE IN AEPB Inhalation Inhale 1 puff into the lungs every 12 (twelve) hours. 60 each 0  . FLUTICASONE-SALMETEROL 100-50 MCG/DOSE IN AEPB Inhalation Inhale 1 puff into the lungs 2 (two) times daily. 60 each 0  . GLUCOSE BLOOD VI STRP  Use as instructed 100 each 3  . KETOCONAZOLE 200 MG PO TABS Oral Take 1 tablet (200 mg total) by mouth 2 (two) times daily. 1 by mouth two times a day for 5 days 60 tablet 0  . LANCETS MISC  Use as directed once daily     .  METFORMIN HCL ER (MOD) 500 MG PO TB24 Oral Take 2 tablets (1,000 mg total) by mouth daily. 60 tablet 0  . OMEPRAZOLE 20 MG PO CPDR Oral Take 1 capsule (20 mg total) by mouth daily. 30 capsule 0  . SIMVASTATIN 40 MG PO TABS Oral Take 1 tablet (40 mg total) by mouth daily. 30 tablet 0  . CYANOCOBALAMIN 250 MCG PO TABS Oral Take 1 tablet (250 mcg total) by mouth daily. 30 tablet 0    BP 153/75  Pulse 94  Temp(Src) 98.6 F (37 C) (Oral)  Resp 16  Ht 5\' 11"  (1.803 m)  Wt 170 lb (77.111 kg)  BMI 23.71 kg/m2  SpO2 99%  Physical Exam  Nursing note and vitals reviewed. Constitutional: He is oriented to person, place, and time. He appears well-developed. No distress.  HENT:  Head:  Normocephalic and atraumatic.  Eyes: Conjunctivae and EOM are normal.  Cardiovascular: Normal rate and regular rhythm.   Pulmonary/Chest: Effort normal. No stridor. No respiratory distress.  Abdominal: Bowel sounds are normal. He exhibits no distension. There is tenderness. There is no rigidity, no rebound and no guarding. A hernia is present. Hernia confirmed positive in the left inguinal area.  Musculoskeletal: He exhibits no edema.  Neurological: He is alert and oriented to person, place, and time.  Skin: Skin is warm and dry.  Psychiatric: He has a normal mood and affect.    ED Course  Procedures (including critical care time)   Labs Reviewed  COMPREHENSIVE METABOLIC PANEL  CBC  DIFFERENTIAL  LIPASE, BLOOD  URINALYSIS, ROUTINE W REFLEX MICROSCOPIC   No results found.   No diagnosis found.   CT (reviewed by me)  Inguinal hernia w bowel loops, no e/o obstruction. MDM  This 77yo M p/w L inguinal hernia and increasing pain.  The patient remains tolerant of PO, has no change in bowel movement patterns and is afebrile w stable VS and unremarkable labs.  CT notable for re-demonstration of hernia.  The need for expeditious f/u was discussed at length with the patient, who was d/c in stable condition.        Gerhard Munch, MD 09/12/11 [phone]

## 2011-09-12 NOTE — ED Notes (Signed)
MD at bedside. 

## 2011-09-21 ENCOUNTER — Telehealth: Payer: Self-pay

## 2011-09-21 NOTE — Telephone Encounter (Signed)
[facility] Triage Call Report Triage Record Num: [id number] Operator: Terance Ice Patient Name: [name] [name] Call Date & Time: 09/19/2011 9:10:58PM Patient Phone: [phone] PCP: [name] Patient Gender: Male PCP Fax : [phone] Patient DOB: 1933-12-02 Practice Name: [facility] Reason for Call: Caller: [name]/Patient; PCP: [name]; CB#: [phone]; Call Reason: Hernia, in Pain; Sx Onset: 09/12/2011; Sx Notes: dx with groin hernia at ED on 12/23 or 09/12/11 and was d/c with oxycodone but has run out and is in severe pain, requests help with pain since he states has to wait until he gets paid next week before he can afford to come in for office visit, able to eat and keep fluids down, denies blood in stool, hernia is bulging out and at times have pain so intense it induces vomiting; Afebrile; Guideline Used: Abdominal Pain; Disp:See ED Immediately; Appt Scheduled?: no, pt states will go to ED, s/w resource RN Dianne. Protocol(s) Used: Abdominal Pain Recommended Outcome per Protocol: See ED Immediately Reason for Outcome: Unable to reduce diagnosed hernia AND has severe pain, nausea/vomiting or any fever Care Advice: ~ Another adult should drive. ~ Do not eat or drink anything until evaluated by provider. ~ IMMEDIATE ACTION 09/19/2011 9:33:09PM Page 1 of 1 [email or web address]

## 2011-10-21 HISTORY — PX: INGUINAL HERNIA REPAIR: SUR1180

## 2012-01-06 ENCOUNTER — Encounter (HOSPITAL_COMMUNITY): Payer: Self-pay

## 2012-01-06 ENCOUNTER — Emergency Department (HOSPITAL_COMMUNITY)
Admission: EM | Admit: 2012-01-06 | Discharge: 2012-01-07 | Disposition: A | Discharge status: Home / Self Care | Payer: Medicare (Managed Care) | Attending: Emergency Medicine | Admitting: Emergency Medicine

## 2012-01-06 ENCOUNTER — Emergency Department (HOSPITAL_COMMUNITY): Payer: Medicare (Managed Care)

## 2012-01-06 DIAGNOSIS — R319 Hematuria, unspecified: Secondary | ICD-10-CM | POA: Insufficient documentation

## 2012-01-06 DIAGNOSIS — E119 Type 2 diabetes mellitus without complications: Secondary | ICD-10-CM | POA: Insufficient documentation

## 2012-01-06 DIAGNOSIS — R531 Weakness: Secondary | ICD-10-CM

## 2012-01-06 DIAGNOSIS — I10 Essential (primary) hypertension: Secondary | ICD-10-CM | POA: Insufficient documentation

## 2012-01-06 DIAGNOSIS — J438 Other emphysema: Secondary | ICD-10-CM | POA: Insufficient documentation

## 2012-01-06 DIAGNOSIS — R5381 Other malaise: Secondary | ICD-10-CM | POA: Insufficient documentation

## 2012-01-06 DIAGNOSIS — R5383 Other fatigue: Secondary | ICD-10-CM | POA: Insufficient documentation

## 2012-01-06 DIAGNOSIS — E785 Hyperlipidemia, unspecified: Secondary | ICD-10-CM | POA: Insufficient documentation

## 2012-01-06 DIAGNOSIS — K279 Peptic ulcer, site unspecified, unspecified as acute or chronic, without hemorrhage or perforation: Secondary | ICD-10-CM | POA: Insufficient documentation

## 2012-01-06 DIAGNOSIS — Z87891 Personal history of nicotine dependence: Secondary | ICD-10-CM | POA: Insufficient documentation

## 2012-01-06 LAB — COMPREHENSIVE METABOLIC PANEL
ALT: 10 U/L (ref 0–53)
AST: 11 U/L (ref 0–37)
Albumin: 3.3 g/dL — ABNORMAL LOW (ref 3.5–5.2)
Alkaline Phosphatase: 86 U/L (ref 39–117)
CO2: 26 mEq/L (ref 19–32)
Chloride: 105 mEq/L (ref 96–112)
Creatinine, Ser: 1 mg/dL (ref 0.50–1.35)
Potassium: 3.5 mEq/L (ref 3.5–5.1)
Sodium: 142 mEq/L (ref 135–145)
Total Bilirubin: 0.4 mg/dL (ref 0.3–1.2)

## 2012-01-06 LAB — POCT I-STAT TROPONIN I

## 2012-01-06 LAB — URINALYSIS, ROUTINE W REFLEX MICROSCOPIC
Bilirubin Urine: NEGATIVE
Glucose, UA: NEGATIVE mg/dL
Protein, ur: NEGATIVE mg/dL

## 2012-01-06 LAB — URINE MICROSCOPIC-ADD ON

## 2012-01-06 LAB — DIFFERENTIAL
Basophils Absolute: 0.1 10*3/uL (ref 0.0–0.1)
Basophils Relative: 1 % (ref 0–1)
Lymphocytes Relative: 19 % (ref 12–46)
Neutro Abs: 6.2 10*3/uL (ref 1.7–7.7)
Neutrophils Relative %: 72 % (ref 43–77)

## 2012-01-06 LAB — CBC
MCHC: 34.2 g/dL (ref 30.0–36.0)
Platelets: 262 10*3/uL (ref 150–400)
RDW: 13.7 % (ref 11.5–15.5)
WBC: 8.5 10*3/uL (ref 4.0–10.5)

## 2012-01-06 MED ORDER — SODIUM CHLORIDE 0.9 % IV BOLUS (SEPSIS)
1000.0000 mL | Freq: Once | INTRAVENOUS | Status: AC
  Administered 2012-01-06: 1000 mL via INTRAVENOUS

## 2012-01-06 MED ORDER — SODIUM CHLORIDE 0.9 % IV SOLN
Freq: Once | INTRAVENOUS | Status: AC
  Administered 2012-01-06: 20:00:00 via INTRAVENOUS

## 2012-01-06 NOTE — ED Provider Notes (Signed)
History     CSN: [id number]  Arrival date & time 01/06/12  1605   First MD Initiated Contact with Patient 01/06/12 1611      Chief Complaint  Patient presents with  . Weakness    (Consider location/radiation/quality/duration/timing/severity/associated sxs/prior treatment) Patient is a 78 y.o. male presenting with weakness. The history is provided by the patient.  Weakness  Additional symptoms include weakness.   patient here with whole-body weakness x8 hours. Denies any chest pain or chest pressure. Some polyuria and polydipsia. Notes normal appetite. No vomiting diarrhea or fever. Nothing makes her symptoms better or worse. Denies any black or bloody stools. No recent fevers or urinary symptoms. No medications used prior to arrival. History of similar symptoms in the past but does not know the diagnosis he was given. Notes that his blood sugars have been controlled  Past Medical History  Diagnosis Date  . DIABETES MELLITUS, TYPE II 11/03/2009  . HYPERLIPIDEMIA 11/03/2009  . ANXIETY 11/03/2009  . DEPRESSION 11/03/2009  . RESTLESS LEG SYNDROME 11/03/2009  . HYPERTENSION 11/03/2009  . CHRONIC OBSTRUCTIVE PULMONARY DISEASE, ACUTE EXACERBATION 11/03/2009  . EMPHYSEMA, BULLOUS 11/03/2009  . ASTHMA 11/03/2009  . COPD 11/03/2009  . GERD 11/03/2009  . PEPTIC ULCER DISEASE 11/03/2009  . ABSCESS, FINGER 04/07/2010  . ABSCESS 12/03/2009  . DISC DISEASE, LUMBAR 11/03/2009  . SPINAL STENOSIS, LUMBAR 11/03/2009  . RASH-NONVESICULAR 11/03/2009  . NEPHROLITHIASIS, HX OF 11/03/2009    Past Surgical History  Procedure Date  . Rotator cuff repair 2002  . S/p left foot surgury      Morton's Neuroma  . Tonsillectomy     Family History  Problem Relation Age of Onset  . Heart disease Mother   . Cancer Brother     lung    History  Substance Use Topics  . Smoking status: Former Games developer  . Smokeless tobacco: Not on file   Comment: since 1993  . Alcohol Use: No      Review of Systems    Neurological: Positive for weakness.  All other systems reviewed and are negative.    Allergies  Review of patient's allergies indicates no known allergies.  Home Medications   Current Outpatient Rx  Name Route Sig Dispense Refill  . AMLODIPINE BESYLATE 5 MG PO TABS Oral Take 1 tablet (5 mg total) by mouth daily. 30 tablet 0  . CITALOPRAM HYDROBROMIDE 10 MG PO TABS Oral Take 1 tablet (10 mg total) by mouth daily. 30 tablet 0  . FLUTICASONE-SALMETEROL 100-50 MCG/DOSE IN AEPB Inhalation Inhale 1 puff into the lungs every 12 (twelve) hours. 60 each 0  . GLUCOSE BLOOD VI STRP  Use as instructed 100 each 3  . METFORMIN HCL ER (MOD) 500 MG PO TB24 Oral Take 2 tablets (1,000 mg total) by mouth daily. 60 tablet 0  . OMEPRAZOLE 20 MG PO CPDR Oral Take 1 capsule (20 mg total) by mouth daily. 30 capsule 0  . SIMVASTATIN 40 MG PO TABS Oral Take 1 tablet (40 mg total) by mouth daily. 30 tablet 0  . CYANOCOBALAMIN 250 MCG PO TABS Oral Take 1 tablet (250 mcg total) by mouth daily. 30 tablet 0    BP 127/71  Pulse 91  Temp(Src) 98.8 F (37.1 C) (Oral)  Resp 20  SpO2 94%  Physical Exam  Nursing note and vitals reviewed. Constitutional: He is oriented to person, place, and time. He appears well-developed and well-nourished.  Non-toxic appearance. No distress.  HENT:  Head: Normocephalic and atraumatic.  Eyes: Conjunctivae, EOM and lids are normal. Pupils are equal, round, and reactive to light.  Neck: Normal range of motion. Neck supple. No tracheal deviation present. No mass present.  Cardiovascular: Normal rate, regular rhythm and normal heart sounds.  Exam reveals no gallop.   No murmur heard. Pulmonary/Chest: Effort normal and breath sounds normal. No stridor. No respiratory distress. He has no decreased breath sounds. He has no wheezes. He has no rhonchi. He has no rales.  Abdominal: Soft. Normal appearance and bowel sounds are normal. He exhibits no distension. There is no tenderness.  There is no rebound and no CVA tenderness.  Musculoskeletal: Normal range of motion. He exhibits no edema and no tenderness.  Neurological: He is alert and oriented to person, place, and time. He has normal strength. No cranial nerve deficit or sensory deficit. GCS eye subscore is 4. GCS verbal subscore is 5. GCS motor subscore is 6.  Skin: Skin is warm and dry. No abrasion and no rash noted.  Psychiatric: His speech is normal and behavior is normal. His affect is blunt.    ED Course  Procedures (including critical care time)   Labs Reviewed  CBC  DIFFERENTIAL  COMPREHENSIVE METABOLIC PANEL  URINALYSIS, ROUTINE W REFLEX MICROSCOPIC  URINE CULTURE   No results found.   No diagnosis found.    MDM   Date: 01/06/2012  Rate: 90  Rhythm: normal sinus rhythm  QRS Axis: normal  Intervals: normal  ST/T Wave abnormalities: nonspecific ST changes  Conduction Disutrbances:none  Narrative Interpretation:   Old EKG Reviewed: unchanged    10:04 PM Pt given iv fluids and feels better--given urology referral for hematuria--stable for d/c     Toy Baker, MD 01/06/12 2205

## 2012-01-06 NOTE — Discharge Instructions (Signed)
Call the urologist to schedule a follow up visit for the blood in your urine Hematuria, Adult Hematuria (blood in your urine) can be caused by a bladder infection (cystitis), kidney infection (pyelonephritis), prostate infection (prostatitis), or kidney stone. Infections will usually respond to antibiotics (medications which kill germs), and a kidney stone will usually pass through your urine without further treatment. If you were put on antibiotics, take all the medicine until gone. You may feel better in a few days, but take all of your medicine or the infection may not respond and become more difficult to treat. If antibiotics were not given, an infection did not cause the blood in the urine. A further work up to find out the reason may be needed. HOME CARE INSTRUCTIONS   Drink lots of fluid, 3 to 4 quarts a day. If you have been diagnosed with an infection, cranberry juice is especially recommended, in addition to large amounts of water.   Avoid caffeine, tea, and carbonated beverages, because they tend to irritate the bladder.   Avoid alcohol as it may irritate the prostate.   Only take over-the-counter or prescription medicines for pain, discomfort, or fever as directed by your caregiver.   If you have been diagnosed with a kidney stone follow your caregivers instructions regarding straining your urine to catch the stone.  TO PREVENT FURTHER INFECTIONS:  Empty the bladder often. Avoid holding urine for long periods of time.   After a bowel movement, women should cleanse front to back. Use each tissue only once.   Empty the bladder before and after sexual intercourse if you are a male.   Return to your caregiver if you develop back pain, fever, nausea (feeling sick to your stomach), vomiting, or your symptoms (problems) are not better in 3 days. Return sooner if you are getting worse.  If you have been requested to return for further testing make sure to keep your appointments. If an  infection is not the cause of blood in your urine, X-rays may be required. Your caregiver will discuss this with you. SEEK IMMEDIATE MEDICAL CARE IF:   You have a persistent fever over 102 F (38.9 C).   You develop severe vomiting and are unable to keep the medication down.   You develop severe back or abdominal pain despite taking your medications.   You begin passing a large amount of blood or clots in your urine.   You feel extremely weak or faint, or pass out.  MAKE SURE YOU:   Understand these instructions.   Will watch your condition.   Will get help right away if you are not doing well or get worse.  Document Released: 09/05/2005 Document Revised: 08/25/2011 Document Reviewed: 04/24/2008 [facility] Patient Information 2012 [place], [place].Fatigue Fatigue is a feeling of tiredness, lack of energy, lack of motivation, or feeling tired all the time. Having enough rest, good nutrition, and reducing stress will normally reduce fatigue. Consult your caregiver if it persists. The nature of your fatigue will help your caregiver to find out its cause. The treatment is based on the cause.  CAUSES  There are many causes for fatigue. Most of the time, fatigue can be traced to one or more of your habits or routines. Most causes fit into one or more of three general areas. They are: Lifestyle problems  Sleep disturbances.   Overwork.   Physical exertion.   Unhealthy habits.   Poor eating habits or eating disorders.   Alcohol and/or drug use .  Lack of proper nutrition (malnutrition).  Psychological problems  Stress and/or anxiety problems.   Depression.   Grief.   Boredom.  Medical Problems or Conditions  Anemia.   Pregnancy.   Thyroid gland problems.   Recovery from major surgery.   Continuous pain.   Emphysema or asthma that is not well controlled   Allergic conditions.   Diabetes.   Infections (such as mononucleosis).   Obesity.   Sleep disorders,  such as sleep apnea.   Heart failure or other heart-related problems.   Cancer.   Kidney disease.   Liver disease.   Effects of certain medicines such as antihistamines, cough and cold remedies, prescription pain medicines, heart and blood pressure medicines, drugs used for treatment of cancer, and some antidepressants.  SYMPTOMS  The symptoms of fatigue include:   Lack of energy.   Lack of drive (motivation).   Drowsiness.   Feeling of indifference to the surroundings.  DIAGNOSIS  The details of how you feel help guide your caregiver in finding out what is causing the fatigue. You will be asked about your present and past health condition. It is important to review all medicines that you take, including prescription and non-prescription items. A thorough exam will be done. You will be questioned about your feelings, habits, and normal lifestyle. Your caregiver may suggest blood tests, urine tests, or other tests to look for common medical causes of fatigue.  TREATMENT  Fatigue is treated by correcting the underlying cause. For example, if you have continuous pain or depression, treating these causes will improve how you feel. Similarly, adjusting the dose of certain medicines will help in reducing fatigue.  HOME CARE INSTRUCTIONS   Try to get the required amount of good sleep every night.   Eat a healthy and nutritious diet, and drink enough water throughout the day.   Practice ways of relaxing (including yoga or meditation).   Exercise regularly.   Make plans to change situations that cause stress. Act on those plans so that stresses decrease over time. Keep your work and personal routine reasonable.   Avoid street drugs and minimize use of alcohol.   Start taking a daily multivitamin after consulting your caregiver.  SEEK MEDICAL CARE IF:   You have persistent tiredness, which cannot be accounted for.   You have fever.   You have unintentional weight loss.   You  have headaches.   You have disturbed sleep throughout the night.   You are feeling sad.   You have constipation.   You have dry skin.   You have gained weight.   You are taking any new or different medicines that you suspect are causing fatigue.   You are unable to sleep at night.   You develop any unusual swelling of your legs or other parts of your body.  SEEK IMMEDIATE MEDICAL CARE IF:   You are feeling confused.   Your vision is blurred.   You feel faint or pass out.   You develop severe headache.   You develop severe abdominal, pelvic, or back pain.   You develop chest pain, shortness of breath, or an irregular or fast heartbeat.   You are unable to pass a normal amount of urine.   You develop abnormal bleeding such as bleeding from the rectum or you vomit blood.   You have thoughts about harming yourself or committing suicide.   You are worried that you might harm someone else.  MAKE SURE YOU:   Understand these  instructions.   Will watch your condition.   Will get help right away if you are not doing well or get worse.  Document Released: 07/03/2007 Document Revised: 08/25/2011 Document Reviewed: 07/03/2007 [facility] Patient Information 2012 [place], [place].

## 2012-01-06 NOTE — ED Notes (Signed)
Patient can not obtain urine at this time

## 2012-01-06 NOTE — ED Notes (Signed)
Pt left prior to receiving paperwork and discharge paperwork.  Pt upset because nothing was found to be wrong with him.

## 2012-01-06 NOTE — ED Notes (Signed)
Assumed care of pt.  No distress noted.  Pt had to be woken up on RN assessment.  Reports that he feels terrible-very vague.  Denies pain.

## 2012-01-06 NOTE — ED Notes (Signed)
Pt. Woke up with weakness, blurred vision and dizziness.    Pt. Denies any chest pain or sob.

## 2012-01-07 LAB — URINE CULTURE
Colony Count: NO GROWTH
Culture: NO GROWTH

## 2012-01-15 ENCOUNTER — Inpatient Hospital Stay (HOSPITAL_COMMUNITY)
Admission: EM | Admit: 2012-01-15 | Discharge: 2012-01-19 | DRG: 690 | Disposition: A | Discharge status: Home / Self Care | Payer: Medicare (Managed Care) | Attending: Internal Medicine | Admitting: Internal Medicine

## 2012-01-15 ENCOUNTER — Encounter (HOSPITAL_COMMUNITY): Payer: Self-pay | Admitting: *Deleted

## 2012-01-15 ENCOUNTER — Emergency Department (HOSPITAL_COMMUNITY): Payer: Medicare (Managed Care)

## 2012-01-15 ENCOUNTER — Observation Stay (HOSPITAL_COMMUNITY): Payer: Medicare (Managed Care)

## 2012-01-15 DIAGNOSIS — M5416 Radiculopathy, lumbar region: Secondary | ICD-10-CM

## 2012-01-15 DIAGNOSIS — Z87442 Personal history of urinary calculi: Secondary | ICD-10-CM

## 2012-01-15 DIAGNOSIS — M5137 Other intervertebral disc degeneration, lumbosacral region: Secondary | ICD-10-CM

## 2012-01-15 DIAGNOSIS — R112 Nausea with vomiting, unspecified: Secondary | ICD-10-CM | POA: Diagnosis present

## 2012-01-15 DIAGNOSIS — G2581 Restless legs syndrome: Secondary | ICD-10-CM

## 2012-01-15 DIAGNOSIS — J439 Emphysema, unspecified: Secondary | ICD-10-CM

## 2012-01-15 DIAGNOSIS — R109 Unspecified abdominal pain: Secondary | ICD-10-CM

## 2012-01-15 DIAGNOSIS — A498 Other bacterial infections of unspecified site: Secondary | ICD-10-CM | POA: Diagnosis present

## 2012-01-15 DIAGNOSIS — Z87891 Personal history of nicotine dependence: Secondary | ICD-10-CM

## 2012-01-15 DIAGNOSIS — J4489 Other specified chronic obstructive pulmonary disease: Secondary | ICD-10-CM | POA: Diagnosis present

## 2012-01-15 DIAGNOSIS — F411 Generalized anxiety disorder: Secondary | ICD-10-CM

## 2012-01-15 DIAGNOSIS — F329 Major depressive disorder, single episode, unspecified: Secondary | ICD-10-CM

## 2012-01-15 DIAGNOSIS — Z720 Tobacco use: Secondary | ICD-10-CM

## 2012-01-15 DIAGNOSIS — R42 Dizziness and giddiness: Secondary | ICD-10-CM

## 2012-01-15 DIAGNOSIS — N390 Urinary tract infection, site not specified: Principal | ICD-10-CM | POA: Diagnosis present

## 2012-01-15 DIAGNOSIS — R197 Diarrhea, unspecified: Secondary | ICD-10-CM | POA: Diagnosis present

## 2012-01-15 DIAGNOSIS — K219 Gastro-esophageal reflux disease without esophagitis: Secondary | ICD-10-CM

## 2012-01-15 DIAGNOSIS — J449 Chronic obstructive pulmonary disease, unspecified: Secondary | ICD-10-CM | POA: Diagnosis present

## 2012-01-15 DIAGNOSIS — I951 Orthostatic hypotension: Secondary | ICD-10-CM

## 2012-01-15 DIAGNOSIS — F3289 Other specified depressive episodes: Secondary | ICD-10-CM | POA: Diagnosis present

## 2012-01-15 DIAGNOSIS — K279 Peptic ulcer, site unspecified, unspecified as acute or chronic, without hemorrhage or perforation: Secondary | ICD-10-CM

## 2012-01-15 DIAGNOSIS — M48061 Spinal stenosis, lumbar region without neurogenic claudication: Secondary | ICD-10-CM

## 2012-01-15 DIAGNOSIS — E785 Hyperlipidemia, unspecified: Secondary | ICD-10-CM | POA: Diagnosis present

## 2012-01-15 DIAGNOSIS — R634 Abnormal weight loss: Secondary | ICD-10-CM

## 2012-01-15 DIAGNOSIS — F1410 Cocaine abuse, uncomplicated: Secondary | ICD-10-CM

## 2012-01-15 DIAGNOSIS — E860 Dehydration: Secondary | ICD-10-CM

## 2012-01-15 DIAGNOSIS — E119 Type 2 diabetes mellitus without complications: Secondary | ICD-10-CM | POA: Diagnosis present

## 2012-01-15 DIAGNOSIS — J45909 Unspecified asthma, uncomplicated: Secondary | ICD-10-CM

## 2012-01-15 DIAGNOSIS — I10 Essential (primary) hypertension: Secondary | ICD-10-CM | POA: Diagnosis present

## 2012-01-15 LAB — COMPREHENSIVE METABOLIC PANEL
BUN: 17 mg/dL (ref 6–23)
CO2: 26 mEq/L (ref 19–32)
Chloride: 98 mEq/L (ref 96–112)
Creatinine, Ser: 1.15 mg/dL (ref 0.50–1.35)
GFR calc non Af Amer: 60 mL/min — ABNORMAL LOW (ref >90)
Total Bilirubin: 0.9 mg/dL (ref 0.3–1.2)

## 2012-01-15 LAB — HEPATIC FUNCTION PANEL
ALT: 15 U/L (ref 0–53)
Albumin: 2.7 g/dL — ABNORMAL LOW (ref 3.5–5.2)
Alkaline Phosphatase: 109 U/L (ref 39–117)
Indirect Bilirubin: 0.9 mg/dL (ref 0.3–0.9)
Total Bilirubin: 1.1 mg/dL (ref 0.3–1.2)

## 2012-01-15 LAB — URINALYSIS, ROUTINE W REFLEX MICROSCOPIC
Bilirubin Urine: NEGATIVE
Glucose, UA: NEGATIVE mg/dL
Ketones, ur: NEGATIVE mg/dL
Nitrite: NEGATIVE
Protein, ur: 100 mg/dL — AB

## 2012-01-15 LAB — CARDIAC PANEL(CRET KIN+CKTOT+MB+TROPI)
CK, MB: 0.7 ng/mL (ref 0.3–4.0)
CK, MB: 0.9 ng/mL (ref 0.3–4.0)
Total CK: 17 U/L (ref 7–232)
Troponin I: <0.3 ng/mL (ref <0.30)
Troponin I: <0.3 ng/mL (ref <0.30)

## 2012-01-15 LAB — DIFFERENTIAL
Lymphocytes Relative: 6 % — ABNORMAL LOW (ref 12–46)
Lymphs Abs: 0.7 10*3/uL (ref 0.7–4.0)
Monocytes Absolute: 0.7 10*3/uL (ref 0.1–1.0)
Monocytes Relative: 6 % (ref 3–12)
Neutro Abs: 11.2 10*3/uL — ABNORMAL HIGH (ref 1.7–7.7)

## 2012-01-15 LAB — CBC
HCT: 44.5 % (ref 39.0–52.0)
Hemoglobin: 15.5 g/dL (ref 13.0–17.0)
WBC: 12.7 10*3/uL — ABNORMAL HIGH (ref 4.0–10.5)

## 2012-01-15 LAB — GLUCOSE, CAPILLARY
Glucose-Capillary: 150 mg/dL — ABNORMAL HIGH (ref 70–99)
Glucose-Capillary: 177 mg/dL — ABNORMAL HIGH (ref 70–99)

## 2012-01-15 LAB — MAGNESIUM: Magnesium: 1.9 mg/dL (ref 1.5–2.5)

## 2012-01-15 LAB — LIPASE, BLOOD: Lipase: 17 U/L (ref 11–59)

## 2012-01-15 LAB — URINE MICROSCOPIC-ADD ON

## 2012-01-15 MED ORDER — ACETAMINOPHEN 650 MG RE SUPP: 650.0000 mg | Freq: Four times a day (QID) | RECTAL | Status: DC | PRN

## 2012-01-15 MED ORDER — ONDANSETRON HCL 4 MG PO TABS: 4.0000 mg | ORAL_TABLET | Freq: Four times a day (QID) | ORAL | Status: DC | PRN

## 2012-01-15 MED ORDER — POTASSIUM CHLORIDE IN NACL 20-0.9 MEQ/L-% IV SOLN
INTRAVENOUS | Status: DC
  Administered 2012-01-15: 1000 mL via INTRAVENOUS
  Administered 2012-01-16: 17:00:00 via INTRAVENOUS
  Administered 2012-01-16: 1000 mL via INTRAVENOUS
  Administered 2012-01-17 – 2012-01-19 (×4): via INTRAVENOUS
  Filled 2012-01-15 (×10): qty 1000

## 2012-01-15 MED ORDER — ACETAMINOPHEN 325 MG PO TABS
650.0000 mg | ORAL_TABLET | Freq: Four times a day (QID) | ORAL | Status: DC | PRN
  Administered 2012-01-15: 650 mg via ORAL
  Filled 2012-01-15: qty 2

## 2012-01-15 MED ORDER — ONDANSETRON HCL 4 MG/2ML IJ SOLN
4.0000 mg | Freq: Once | INTRAMUSCULAR | Status: AC
  Administered 2012-01-15: 4 mg via INTRAVENOUS
  Filled 2012-01-15: qty 2

## 2012-01-15 MED ORDER — ONDANSETRON HCL 4 MG/2ML IJ SOLN: 4.0000 mg | Freq: Four times a day (QID) | INTRAMUSCULAR | Status: DC | PRN

## 2012-01-15 MED ORDER — CITALOPRAM HYDROBROMIDE 10 MG PO TABS
10.0000 mg | ORAL_TABLET | Freq: Every day | ORAL | Status: DC
  Administered 2012-01-15 – 2012-01-19 (×5): 10 mg via ORAL
  Filled 2012-01-15 (×5): qty 1

## 2012-01-15 MED ORDER — TRAMADOL HCL 50 MG PO TABS
50.0000 mg | ORAL_TABLET | Freq: Two times a day (BID) | ORAL | Status: DC | PRN
  Administered 2012-01-15 – 2012-01-18 (×4): 50 mg via ORAL
  Filled 2012-01-15 (×4): qty 1

## 2012-01-15 MED ORDER — IPRATROPIUM BROMIDE 0.02 % IN SOLN
0.5000 mg | Freq: Four times a day (QID) | RESPIRATORY_TRACT | Status: DC
  Administered 2012-01-15 (×2): 0.5 mg via RESPIRATORY_TRACT
  Filled 2012-01-15 (×2): qty 2.5

## 2012-01-15 MED ORDER — MORPHINE SULFATE 4 MG/ML IJ SOLN
4.0000 mg | Freq: Once | INTRAMUSCULAR | Status: AC
  Administered 2012-01-15: 4 mg via INTRAVENOUS
  Filled 2012-01-15: qty 1

## 2012-01-15 MED ORDER — FLUTICASONE-SALMETEROL 100-50 MCG/DOSE IN AEPB
1.0000 | INHALATION_SPRAY | Freq: Two times a day (BID) | RESPIRATORY_TRACT | Status: DC
  Administered 2012-01-15 – 2012-01-19 (×8): 1 via RESPIRATORY_TRACT
  Filled 2012-01-15: qty 14

## 2012-01-15 MED ORDER — IPRATROPIUM BROMIDE 0.02 % IN SOLN: 0.5000 mg | Freq: Four times a day (QID) | RESPIRATORY_TRACT | Status: DC | PRN

## 2012-01-15 MED ORDER — DEXTROSE 5 % IV SOLN
1.0000 g | INTRAVENOUS | Status: DC
  Filled 2012-01-15: qty 10

## 2012-01-15 MED ORDER — LEVALBUTEROL HCL 0.63 MG/3ML IN NEBU
0.6300 mg | INHALATION_SOLUTION | Freq: Four times a day (QID) | RESPIRATORY_TRACT | Status: DC | PRN
  Administered 2012-01-17 – 2012-01-18 (×2): 0.63 mg via RESPIRATORY_TRACT
  Filled 2012-01-15 (×2): qty 3

## 2012-01-15 MED ORDER — LEVALBUTEROL HCL 0.63 MG/3ML IN NEBU
0.6300 mg | INHALATION_SOLUTION | Freq: Four times a day (QID) | RESPIRATORY_TRACT | Status: DC
  Administered 2012-01-15 (×2): 0.63 mg via RESPIRATORY_TRACT
  Filled 2012-01-15 (×4): qty 3

## 2012-01-15 MED ORDER — PANTOPRAZOLE SODIUM 40 MG PO TBEC
40.0000 mg | DELAYED_RELEASE_TABLET | Freq: Every day | ORAL | Status: DC
  Administered 2012-01-15 – 2012-01-19 (×5): 40 mg via ORAL
  Filled 2012-01-15 (×5): qty 1

## 2012-01-15 MED ORDER — DEXTROSE 5 % IV SOLN
1.0000 g | Freq: Once | INTRAVENOUS | Status: AC
  Administered 2012-01-15: 1 g via INTRAVENOUS
  Filled 2012-01-15: qty 10

## 2012-01-15 MED ORDER — ENOXAPARIN SODIUM 40 MG/0.4ML ~~LOC~~ SOLN
40.0000 mg | SUBCUTANEOUS | Status: DC
  Administered 2012-01-15 – 2012-01-18 (×4): 40 mg via SUBCUTANEOUS
  Filled 2012-01-15 (×5): qty 0.4

## 2012-01-15 MED ORDER — DEXTROSE 5 % IV SOLN
1.0000 g | INTRAVENOUS | Status: DC
  Administered 2012-01-16 – 2012-01-18 (×3): 1 g via INTRAVENOUS
  Filled 2012-01-15 (×4): qty 10

## 2012-01-15 MED ORDER — INSULIN ASPART 100 UNIT/ML ~~LOC~~ SOLN
0.0000 [IU] | Freq: Three times a day (TID) | SUBCUTANEOUS | Status: DC
  Administered 2012-01-15: 2 [IU] via SUBCUTANEOUS
  Administered 2012-01-16: 3 [IU] via SUBCUTANEOUS
  Administered 2012-01-16: 11 [IU] via SUBCUTANEOUS
  Administered 2012-01-16: 3 [IU] via SUBCUTANEOUS
  Administered 2012-01-17: 5 [IU] via SUBCUTANEOUS
  Administered 2012-01-17: 11 [IU] via SUBCUTANEOUS
  Administered 2012-01-17: 3 [IU] via SUBCUTANEOUS
  Administered 2012-01-18: 2 [IU] via SUBCUTANEOUS
  Administered 2012-01-18 (×2): 3 [IU] via SUBCUTANEOUS
  Administered 2012-01-19 (×2): 5 [IU] via SUBCUTANEOUS

## 2012-01-15 MED ORDER — METHYLPREDNISOLONE SODIUM SUCC 40 MG IJ SOLR
40.0000 mg | Freq: Two times a day (BID) | INTRAMUSCULAR | Status: DC
  Administered 2012-01-15 – 2012-01-19 (×8): 40 mg via INTRAVENOUS
  Filled 2012-01-15 (×10): qty 1

## 2012-01-15 MED ORDER — FENTANYL CITRATE 0.05 MG/ML IJ SOLN
50.0000 ug | Freq: Once | INTRAMUSCULAR | Status: AC
  Administered 2012-01-15: 50 ug via INTRAVENOUS
  Filled 2012-01-15: qty 2

## 2012-01-15 MED ORDER — SIMVASTATIN 40 MG PO TABS
40.0000 mg | ORAL_TABLET | Freq: Every day | ORAL | Status: DC
  Administered 2012-01-15 – 2012-01-18 (×4): 40 mg via ORAL
  Filled 2012-01-15 (×5): qty 1

## 2012-01-15 NOTE — H&P (Signed)
[name] [name] MRN: [id number] DOB/AGE: [birth year] 78 y.o. Primary Care Physician:James Jonny Ruiz, MD, MD Admit date: 01/15/2012 Chief Complaint: Weakness History of present illness 78 year old male with a history of type 2 diabetes, COPD who presents to the ER with a chief complaint of generalized weakness, nausea, vomiting, diarrhea since Wednesday. His symptoms started a week and a half ago. He presented with similar symptoms on April 19, to the ER. The patient was found to have some microscopic hematuria, mild UTI, he was hydrated with IV fluids, was given a referral to follow up with urology outpatient and discharge from the ER. He presents today with increasing urinary frequency, dysuria, low-grade fever, myalgias. He found her urinary tract infection. He complains of decrease in appetite. He also complains of inability to take care of himself at home. He denies any hematemesis, hematochezia, melena. He denies any chest pain but does complain of exertional shortness of breath for the last one week.    , Past Medical History  Diagnosis Date  . DIABETES MELLITUS, TYPE II 11/03/2009  . HYPERLIPIDEMIA 11/03/2009  . ANXIETY 11/03/2009  . DEPRESSION 11/03/2009  . RESTLESS LEG SYNDROME 11/03/2009  . HYPERTENSION 11/03/2009  . CHRONIC OBSTRUCTIVE PULMONARY DISEASE, ACUTE EXACERBATION 11/03/2009  . EMPHYSEMA, BULLOUS 11/03/2009  . ASTHMA 11/03/2009  . COPD 11/03/2009  . GERD 11/03/2009  . PEPTIC ULCER DISEASE 11/03/2009  . ABSCESS, FINGER 04/07/2010  . ABSCESS 12/03/2009  . DISC DISEASE, LUMBAR 11/03/2009  . SPINAL STENOSIS, LUMBAR 11/03/2009  . RASH-NONVESICULAR 11/03/2009  . NEPHROLITHIASIS, HX OF 11/03/2009    Past Surgical History  Procedure Date  . Rotator cuff repair 2002  . S/p left foot surgury      Morton's Neuroma  . Tonsillectomy     Prior to Admission medications   Medication Sig Start Date End Date Taking? Authorizing Provider  amLODipine (NORVASC) 5 MG tablet Take 1 tablet (5 mg total) by  mouth daily. 08/13/11 08/12/12 Yes Erick Blinks, MD  citalopram (CELEXA) 10 MG tablet Take 1 tablet (10 mg total) by mouth daily. 08/13/11 08/12/12 Yes Erick Blinks, MD  Fluticasone-Salmeterol (ADVAIR DISKUS) 100-50 MCG/DOSE AEPB Inhale 1 puff into the lungs every 12 (twelve) hours. 08/13/11  Yes Erick Blinks, MD  glucose blood (PRODIGY TEST) test strip Use as instructed 04/26/11  Yes [name], MD  metFORMIN (GLUMETZA) 500 MG (MOD) 24 hr tablet Take 2 tablets (1,000 mg total) by mouth daily. 08/13/11  Yes Erick Blinks, MD  omeprazole (PRILOSEC) 20 MG capsule Take 1 capsule (20 mg total) by mouth daily. 08/13/11 08/12/12 Yes Erick Blinks, MD  simvastatin (ZOCOR) 40 MG tablet Take 1 tablet (40 mg total) by mouth daily. 08/13/11  Yes Erick Blinks, MD    Allergies: No Known Allergies  Family History  Problem Relation Age of Onset  . Heart disease Mother   . Cancer Brother     lung    Social History:  reports that he has quit smoking. He does not have any smokeless tobacco history on file. He reports that he does not drink alcohol or use illicit drugs. Review of Systems  Constitutional: Positive for fever, chills, activity change, appetite change and fatigue.  HENT: Negative for neck stiffness.  Eyes: Negative for pain.  Respiratory: Positive for cough and shortness of breath. Negative for chest tightness.  Cardiovascular: Positive for chest pain. Negative for leg swelling.  Gastrointestinal: Positive for nausea, vomiting, abdominal pain and diarrhea. Negative for blood in stool.  Genitourinary: Positive for dysuria. Negative for  flank pain.  Musculoskeletal: Positive for myalgias and back pain.  Skin: Negative for rash.  Neurological: Positive for weakness. Negative for numbness and headaches.  Psychiatric/Behavioral: Negative for behavioral problems.   PHYSICAL EXAM: Blood pressure 114/67, pulse 103, temperature 98.5 F (36.9 C), temperature source Oral, resp. rate 16,  SpO2 97.00%. Nursing note and vitals reviewed.  Constitutional: He is oriented to person, place, and time. He appears well-developed and well-nourished.  HENT:  Head: Normocephalic and atraumatic.  Eyes: EOM are normal. Pupils are equal, round, and reactive to light.  Neck: Normal range of motion. Neck supple.  Cardiovascular: Normal rate, regular rhythm and normal heart sounds.  No murmur heard.  Pulmonary/Chest: Effort normal. He has wheezes.  Diffuse harsh breath sounds.  Abdominal: Soft. Bowel sounds are normal. He exhibits no distension and no mass. There is tenderness. There is no rebound and no guarding.  Mild diffuse tenderness without rebound or guarding.  Musculoskeletal: Normal range of motion. He exhibits no edema.  Neurological: He is alert and oriented to person, place, and time. No cranial nerve deficit.  Skin: Skin is warm and dry.    Recent Results (from the past 240 hour(s))  URINE CULTURE     Status: Normal   Collection Time   01/06/12  7:45 PM      Component Value Range Status Comment   Specimen Description URINE, RANDOM   Final    Special Requests NONE   Final    Culture  Setup Time [id number]   Final    Colony Count NO GROWTH   Final    Culture NO GROWTH   Final    Report Status 01/07/2012 FINAL   Final      Results for orders placed during the hospital encounter of 01/15/12 (from the past 48 hour(s))  CBC     Status: Abnormal   Collection Time   01/15/12 11:36 AM      Component Value Range Comment   WBC 12.7 (*) 4.0 - 10.5 (K/uL)    RBC 4.91  4.22 - 5.81 (MIL/uL)    Hemoglobin 15.5  13.0 - 17.0 (g/dL)    HCT 81.1  91.4 - 78.2 (%)    MCV 90.6  78.0 - 100.0 (fL)    MCH 31.6  26.0 - 34.0 (pg)    MCHC 34.8  30.0 - 36.0 (g/dL)    RDW 95.6  21.3 - 08.6 (%)    Platelets 217  150 - 400 (K/uL)   DIFFERENTIAL     Status: Abnormal   Collection Time   01/15/12 11:36 AM      Component Value Range Comment   Neutrophils Relative 88 (*) 43 - 77 (%)    Neutro  Abs 11.2 (*) 1.7 - 7.7 (K/uL)    Lymphocytes Relative 6 (*) 12 - 46 (%)    Lymphs Abs 0.7  0.7 - 4.0 (K/uL)    Monocytes Relative 6  3 - 12 (%)    Monocytes Absolute 0.7  0.1 - 1.0 (K/uL)    Eosinophils Relative 0  0 - 5 (%)    Eosinophils Absolute 0.1  0.0 - 0.7 (K/uL)    Basophils Relative 0  0 - 1 (%)    Basophils Absolute 0.0  0.0 - 0.1 (K/uL)   COMPREHENSIVE METABOLIC PANEL     Status: Abnormal   Collection Time   01/15/12 11:36 AM      Component Value Range Comment   Sodium 135  135 - 145 (mEq/L)  Potassium 3.8  3.5 - 5.1 (mEq/L)    Chloride 98  96 - 112 (mEq/L)    CO2 26  19 - 32 (mEq/L)    Glucose, Bld 150 (*) 70 - 99 (mg/dL)    BUN 17  6 - 23 (mg/dL)    Creatinine, Ser 1.61  0.50 - 1.35 (mg/dL)    Calcium 9.1  8.4 - 10.5 (mg/dL)    Total Protein 7.2  6.0 - 8.3 (g/dL)    Albumin 2.8 (*) 3.5 - 5.2 (g/dL)    AST 18  0 - 37 (U/L) SLIGHT HEMOLYSIS   ALT 14  0 - 53 (U/L)    Alkaline Phosphatase 91  39 - 117 (U/L)    Total Bilirubin 0.9  0.3 - 1.2 (mg/dL)    GFR calc non Af Amer 60 (*) >90 (mL/min)    GFR calc Af Amer 70 (*) >90 (mL/min)   LIPASE, BLOOD     Status: Normal   Collection Time   01/15/12 11:36 AM      Component Value Range Comment   Lipase 17  11 - 59 (U/L)   URINALYSIS, ROUTINE W REFLEX MICROSCOPIC     Status: Abnormal   Collection Time   01/15/12  1:18 PM      Component Value Range Comment   Color, Urine YELLOW  YELLOW     APPearance TURBID (*) CLEAR     Specific Gravity, Urine 1.015  1.005 - 1.030     pH 7.0  5.0 - 8.0     Glucose, UA NEGATIVE  NEGATIVE (mg/dL)    Hgb urine dipstick MODERATE (*) NEGATIVE     Bilirubin Urine NEGATIVE  NEGATIVE     Ketones, ur NEGATIVE  NEGATIVE (mg/dL)    Protein, ur 096 (*) NEGATIVE (mg/dL)    Urobilinogen, UA 1.0  0.0 - 1.0 (mg/dL)    Nitrite NEGATIVE  NEGATIVE     Leukocytes, UA MODERATE (*) NEGATIVE    URINE MICROSCOPIC-ADD ON     Status: Abnormal   Collection Time   01/15/12  1:18 PM      Component Value Range  Comment   Squamous Epithelial / LPF FEW (*) RARE     WBC, UA TOO NUMEROUS TO COUNT  <3 (WBC/hpf) WITH CLUMPS   RBC / HPF 7-10  <3 (RBC/hpf)    Bacteria, UA MANY (*) RARE     Urine-Other MUCOUS PRESENT       Dg Chest 2 View  01/06/2012  *RADIOLOGY REPORT*  Clinical Data: Dizziness and weakness.  CHEST - 2 VIEW  Comparison: Two-view chest 04/23/2011.  Findings: Severe emphysematous changes are evident.  Mild interstitial coarsening is chronic.  No focal airspace disease is evident.  Extension of the left lung across midline is stable.  IMPRESSION:  1.  Stable appearance of severe emphysema and chronic interstitial coarsening. 2.  No acute cardiopulmonary disease.  Original Report Authenticated By: Jamesetta Orleans. MATTERN, M.D.   Dg Abd Acute W/chest  01/15/2012  *RADIOLOGY REPORT*  Clinical Data: Shortness of breath.  Nausea vomiting and diarrhea.  ACUTE ABDOMEN SERIES (ABDOMEN 2 VIEW & CHEST 1 VIEW)  Comparison: 09/12/2011  Findings: The heart size appears normal.  No pleural effusion or edema identified.  Chronic interstitial coarsening is noted bilaterally.  No focal airspace consolidation.  No dilated loops of small bowel identified.  There is a single dilated loop of small bowel within the left upper quadrant of the abdomen.  This measures up to 4.2  cm.  Gas and stool noted within the colon up to the rectum.  IMPRESSION:  1.  Nonspecific bowel gas pattern. 2.  Severe emphysema and chronic interstitial coarsening/scarring.  Original Report Authenticated By: Rosealee Albee, M.D.    Impression:  Active Problems:  DIABETES MELLITUS, TYPE II  COPD  Nausea vomiting and diarrhea  UTI (urinary tract infection) Microscopic hematuria     Plan: #1 UTIs patient has a received Rocephin in the ED, urine culture has been sent. The patient did have of right renal stone measuring 3 mm on the CT scan of the abdomen on 09/12/2011. We will repeat a renal ultrasound, to ensure that the patient does not  have any hydronephrosis at this time. If indicated a urology consultation will be obtained tomorrow  #2 intractable nausea vomiting could be attributed to UTI or  Pyelonephritis?  #3 COPD patient for her mild wheezing therefore will be started on low-dose Solu-Medrol, chest x-ray is clear without any evidence of obvious pneumonia  #4 type 2 diabetes,CBG appear  controlled will hold metformin and start the patient on sliding scale insulin He is a full code     [facility] 01/15/2012, 3:17 PM

## 2012-01-15 NOTE — ED Notes (Signed)
MD at bedside. 

## 2012-01-15 NOTE — ED Notes (Signed)
Pt from home c.o fever, chills, body aches, nausea, vomiting, and diarrhea since Wednesday, Onset start after visiting somebody in Hospital ICU.

## 2012-01-15 NOTE — ED Notes (Signed)
RUE:[id number]<JW> Expected date:<BR> Expected time:11:02 AM<BR> Means of arrival:Ambulance<BR> Comments:<BR> M100 -- F/N/V/D

## 2012-01-15 NOTE — Plan of Care (Signed)
Problem: Consults Goal: General Medical Patient Education See Patient Education Module for specific education.  Outcome: Progressing Patient states he  Is from [place] and he is here visiting for a couple months. Patient lives in hotel. Patient to benefit from social work and case management.

## 2012-01-15 NOTE — ED Provider Notes (Signed)
History     CSN: [id number]  Arrival date & time 01/15/12  1107   First MD Initiated Contact with Patient 01/15/12 1138      No chief complaint on file.   (Consider location/radiation/quality/duration/timing/severity/associated sxs/prior treatment) The history is provided by the patient.   patient has had fever chills myalgias nausea vomiting diarrhea since Wednesday. He states he was visiting someone in [place] that had MRSA. He states he was with her for a week and a half. He states he's had chest pain abdominal pain pain with urination he states his pain in his wrists. He states he has pain in his muscles. He states he feels weak all over. He states he has sharp abdominal pain. He states he is having increasing trouble breathing. He states he has decreased appetite. He states his been eating less. When told about the possibility of going home patient started to cry and stated that he can't because she has no one to take care of him there.  Past Medical History  Diagnosis Date  . DIABETES MELLITUS, TYPE II 11/03/2009  . HYPERLIPIDEMIA 11/03/2009  . ANXIETY 11/03/2009  . DEPRESSION 11/03/2009  . RESTLESS LEG SYNDROME 11/03/2009  . HYPERTENSION 11/03/2009  . CHRONIC OBSTRUCTIVE PULMONARY DISEASE, ACUTE EXACERBATION 11/03/2009  . EMPHYSEMA, BULLOUS 11/03/2009  . ASTHMA 11/03/2009  . COPD 11/03/2009  . GERD 11/03/2009  . PEPTIC ULCER DISEASE 11/03/2009  . ABSCESS, FINGER 04/07/2010  . ABSCESS 12/03/2009  . DISC DISEASE, LUMBAR 11/03/2009  . SPINAL STENOSIS, LUMBAR 11/03/2009  . RASH-NONVESICULAR 11/03/2009  . NEPHROLITHIASIS, HX OF 11/03/2009    Past Surgical History  Procedure Date  . Rotator cuff repair 2002  . S/p left foot surgury      Morton's Neuroma  . Tonsillectomy     Family History  Problem Relation Age of Onset  . Heart disease Mother   . Cancer Brother     lung    History  Substance Use Topics  . Smoking status: Former Games developer  . Smokeless tobacco: Not on  file   Comment: since 1993  . Alcohol Use: No      Review of Systems  Constitutional: Positive for fever, chills, activity change, appetite change and fatigue.  HENT: Negative for neck stiffness.   Eyes: Negative for pain.  Respiratory: Positive for cough and shortness of breath. Negative for chest tightness.   Cardiovascular: Positive for chest pain. Negative for leg swelling.  Gastrointestinal: Positive for nausea, vomiting, abdominal pain and diarrhea. Negative for blood in stool.  Genitourinary: Positive for dysuria. Negative for flank pain.  Musculoskeletal: Positive for myalgias and back pain.  Skin: Negative for rash.  Neurological: Positive for weakness. Negative for numbness and headaches.  Psychiatric/Behavioral: Negative for behavioral problems.    Allergies  Review of patient's allergies indicates no known allergies.  Home Medications   Current Outpatient Rx  Name Route Sig Dispense Refill  . AMLODIPINE BESYLATE 5 MG PO TABS Oral Take 1 tablet (5 mg total) by mouth daily. 30 tablet 0  . CITALOPRAM HYDROBROMIDE 10 MG PO TABS Oral Take 1 tablet (10 mg total) by mouth daily. 30 tablet 0  . FLUTICASONE-SALMETEROL 100-50 MCG/DOSE IN AEPB Inhalation Inhale 1 puff into the lungs every 12 (twelve) hours. 60 each 0  . GLUCOSE BLOOD VI STRP  Use as instructed 100 each 3  . METFORMIN HCL ER (MOD) 500 MG PO TB24 Oral Take 2 tablets (1,000 mg total) by mouth daily. 60 tablet 0  .  OMEPRAZOLE 20 MG PO CPDR Oral Take 1 capsule (20 mg total) by mouth daily. 30 capsule 0  . SIMVASTATIN 40 MG PO TABS Oral Take 1 tablet (40 mg total) by mouth daily. 30 tablet 0    BP 114/67  Pulse 103  Temp(Src) 98.5 F (36.9 C) (Oral)  Resp 16  SpO2 97%  Physical Exam  Nursing note and vitals reviewed. Constitutional: He is oriented to person, place, and time. He appears well-developed and well-nourished.  HENT:  Head: Normocephalic and atraumatic.  Eyes: EOM are normal. Pupils are equal,  round, and reactive to light.  Neck: Normal range of motion. Neck supple.  Cardiovascular: Normal rate, regular rhythm and normal heart sounds.   No murmur heard. Pulmonary/Chest: Effort normal. He has wheezes.       Diffuse harsh breath sounds.  Abdominal: Soft. Bowel sounds are normal. He exhibits no distension and no mass. There is tenderness. There is no rebound and no guarding.       Mild diffuse tenderness without rebound or guarding.  Musculoskeletal: Normal range of motion. He exhibits no edema.  Neurological: He is alert and oriented to person, place, and time. No cranial nerve deficit.  Skin: Skin is warm and dry.    ED Course  Procedures (including critical care time)  Labs Reviewed  CBC - Abnormal; Notable for the following:    WBC 12.7 (*)    All other components within normal limits  DIFFERENTIAL - Abnormal; Notable for the following:    Neutrophils Relative 88 (*)    Neutro Abs 11.2 (*)    Lymphocytes Relative 6 (*)    All other components within normal limits  COMPREHENSIVE METABOLIC PANEL - Abnormal; Notable for the following:    Glucose, Bld 150 (*)    Albumin 2.8 (*)    GFR calc non Af Amer 60 (*)    GFR calc Af Amer 70 (*)    All other components within normal limits  URINALYSIS, ROUTINE W REFLEX MICROSCOPIC - Abnormal; Notable for the following:    APPearance TURBID (*)    Hgb urine dipstick MODERATE (*)    Protein, ur 100 (*)    Leukocytes, UA MODERATE (*)    All other components within normal limits  URINE MICROSCOPIC-ADD ON - Abnormal; Notable for the following:    Squamous Epithelial / LPF FEW (*)    Bacteria, UA MANY (*)    All other components within normal limits  LIPASE, BLOOD  URINE CULTURE   Dg Abd Acute W/chest  01/15/2012  *RADIOLOGY REPORT*  Clinical Data: Shortness of breath.  Nausea vomiting and diarrhea.  ACUTE ABDOMEN SERIES (ABDOMEN 2 VIEW & CHEST 1 VIEW)  Comparison: 09/12/2011  Findings: The heart size appears normal.  No pleural  effusion or edema identified.  Chronic interstitial coarsening is noted bilaterally.  No focal airspace consolidation.  No dilated loops of small bowel identified.  There is a single dilated loop of small bowel within the left upper quadrant of the abdomen.  This measures up to 4.2 cm.  Gas and stool noted within the colon up to the rectum.  IMPRESSION:  1.  Nonspecific bowel gas pattern. 2.  Severe emphysema and chronic interstitial coarsening/scarring.  Original Report Authenticated By: Rosealee Albee, M.D.     1. UTI (urinary tract infection)   2. Nausea vomiting and diarrhea       MDM  Patient has nausea vomiting diarrhea multiple complaints. He states he feels weak all over.  He states he cannot manage at home who don't take care of him. Lab work shows a urinary tract infection. He is not in renal failure. He'll be admitted to medicine.        Juliet Rude. Rubin Payor, MD 01/15/12 1402

## 2012-01-16 ENCOUNTER — Encounter (HOSPITAL_COMMUNITY): Payer: Self-pay | Admitting: *Deleted

## 2012-01-16 LAB — CARDIAC PANEL(CRET KIN+CKTOT+MB+TROPI)
CK, MB: 1 ng/mL (ref 0.3–4.0)
Relative Index: INVALID (ref 0.0–2.5)
Total CK: 16 U/L (ref 7–232)
Troponin I: <0.3 ng/mL (ref <0.30)

## 2012-01-16 LAB — COMPREHENSIVE METABOLIC PANEL
ALT: 13 U/L (ref 0–53)
CO2: 25 mEq/L (ref 19–32)
Calcium: 8.9 mg/dL (ref 8.4–10.5)
Creatinine, Ser: 0.87 mg/dL (ref 0.50–1.35)
GFR calc Af Amer: >90 mL/min (ref >90)
GFR calc non Af Amer: 82 mL/min — ABNORMAL LOW (ref >90)
Glucose, Bld: 226 mg/dL — ABNORMAL HIGH (ref 70–99)
Sodium: 136 mEq/L (ref 135–145)
Total Protein: 6.8 g/dL (ref 6.0–8.3)

## 2012-01-16 LAB — INFLUENZA PANEL BY PCR (TYPE A & B): H1N1 flu by pcr: NOT DETECTED

## 2012-01-16 LAB — CBC
HCT: 43.2 % (ref 39.0–52.0)
Hemoglobin: 14.3 g/dL (ref 13.0–17.0)
MCHC: 33.1 g/dL (ref 30.0–36.0)
RBC: 4.81 MIL/uL (ref 4.22–5.81)
WBC: 10.4 10*3/uL (ref 4.0–10.5)

## 2012-01-16 MED ORDER — LORAZEPAM 1 MG PO TABS
1.0000 mg | ORAL_TABLET | Freq: Once | ORAL | Status: AC
  Administered 2012-01-16: 1 mg via ORAL
  Filled 2012-01-16: qty 1

## 2012-01-16 MED ORDER — TAMSULOSIN HCL 0.4 MG PO CAPS
0.4000 mg | ORAL_CAPSULE | Freq: Every day | ORAL | Status: DC
  Administered 2012-01-16 – 2012-01-18 (×3): 0.4 mg via ORAL
  Filled 2012-01-16 (×5): qty 1

## 2012-01-16 MED ORDER — SIMETHICONE 80 MG PO CHEW
160.0000 mg | CHEWABLE_TABLET | Freq: Three times a day (TID) | ORAL | Status: DC
  Administered 2012-01-16 – 2012-01-19 (×11): 160 mg via ORAL
  Filled 2012-01-16 (×14): qty 2

## 2012-01-16 NOTE — Progress Notes (Signed)
CARE MANAGE MENT UTILIZATION REVIEW NOTE 01/16/2012     Patient:  [name] [name], [name] [name]   Account Number:  [id number]  Documented by:  [id number] Faylynn Stamos   Per Ur Regulation Reviewed for med. necessity/level of care/duration of stay

## 2012-01-16 NOTE — Progress Notes (Addendum)
Subjective: Afebrile overnight  Objective: Vital signs in last 24 hours: Filed Vitals:   01/15/12 1712 01/15/12 2106 01/15/12 2120 01/16/12 0520  BP:   125/68 132/81  Pulse:   87 87  Temp:   97.7 F (36.5 C) 98.1 F (36.7 C)  TempSrc:   Oral Oral  Resp:  18 18 18   Height:      Weight:      SpO2: 97% 98% 98% 98%    Intake/Output Summary (Last 24 hours) at 01/16/12 0850 Last data filed at 01/16/12 0700  Gross per 24 hour  Intake 1113.75 ml  Output      0 ml  Net 1113.75 ml    Weight change:  Nursing note and vitals reviewed.  Constitutional: He is oriented to person, place, and time. He appears well-developed and well-nourished.  HENT:  Head: Normocephalic and atraumatic.  Eyes: EOM are normal. Pupils are equal, round, and reactive to light.  Neck: Normal range of motion. Neck supple.  Cardiovascular: Normal rate, regular rhythm and normal heart sounds.  No murmur heard.  Pulmonary/Chest: Effort normal. He has wheezes.  Diffuse harsh breath sounds.  Abdominal: Soft. Bowel sounds are normal. He exhibits no distension and no mass. There is tenderness. There is no rebound and no guarding.  Mild diffuse tenderness without rebound or guarding.  Musculoskeletal: Normal range of motion. He exhibits no edema.  Neurological: He is alert and oriented to person, place, and time. No cranial nerve deficit.  Skin: Skin is warm and dry.       Lab Results: Results for orders placed during the hospital encounter of 01/15/12 (from the past 24 hour(s))  CBC     Status: Abnormal   Collection Time   01/15/12 11:36 AM      Component Value Range   WBC 12.7 (*) 4.0 - 10.5 (K/uL)   RBC 4.91  4.22 - 5.81 (MIL/uL)   Hemoglobin 15.5  13.0 - 17.0 (g/dL)   HCT 16.1  09.6 - 04.5 (%)   MCV 90.6  78.0 - 100.0 (fL)   MCH 31.6  26.0 - 34.0 (pg)   MCHC 34.8  30.0 - 36.0 (g/dL)   RDW 40.9  81.1 - 91.4 (%)   Platelets 217  150 - 400 (K/uL)  DIFFERENTIAL     Status: Abnormal   Collection Time     01/15/12 11:36 AM      Component Value Range   Neutrophils Relative 88 (*) 43 - 77 (%)   Neutro Abs 11.2 (*) 1.7 - 7.7 (K/uL)   Lymphocytes Relative 6 (*) 12 - 46 (%)   Lymphs Abs 0.7  0.7 - 4.0 (K/uL)   Monocytes Relative 6  3 - 12 (%)   Monocytes Absolute 0.7  0.1 - 1.0 (K/uL)   Eosinophils Relative 0  0 - 5 (%)   Eosinophils Absolute 0.1  0.0 - 0.7 (K/uL)   Basophils Relative 0  0 - 1 (%)   Basophils Absolute 0.0  0.0 - 0.1 (K/uL)  COMPREHENSIVE METABOLIC PANEL     Status: Abnormal   Collection Time   01/15/12 11:36 AM      Component Value Range   Sodium 135  135 - 145 (mEq/L)   Potassium 3.8  3.5 - 5.1 (mEq/L)   Chloride 98  96 - 112 (mEq/L)   CO2 26  19 - 32 (mEq/L)   Glucose, Bld 150 (*) 70 - 99 (mg/dL)   BUN 17  6 - 23 (mg/dL)  Creatinine, Ser 1.15  0.50 - 1.35 (mg/dL)   Calcium 9.1  8.4 - 96.0 (mg/dL)   Total Protein 7.2  6.0 - 8.3 (g/dL)   Albumin 2.8 (*) 3.5 - 5.2 (g/dL)   AST 18  0 - 37 (U/L)   ALT 14  0 - 53 (U/L)   Alkaline Phosphatase 91  39 - 117 (U/L)   Total Bilirubin 0.9  0.3 - 1.2 (mg/dL)   GFR calc non Af Amer 60 (*) >90 (mL/min)   GFR calc Af Amer 70 (*) >90 (mL/min)  LIPASE, BLOOD     Status: Normal   Collection Time   01/15/12 11:36 AM      Component Value Range   Lipase 17  11 - 59 (U/L)  URINALYSIS, ROUTINE W REFLEX MICROSCOPIC     Status: Abnormal   Collection Time   01/15/12  1:18 PM      Component Value Range   Color, Urine YELLOW  YELLOW    APPearance TURBID (*) CLEAR    Specific Gravity, Urine 1.015  1.005 - 1.030    pH 7.0  5.0 - 8.0    Glucose, UA NEGATIVE  NEGATIVE (mg/dL)   Hgb urine dipstick MODERATE (*) NEGATIVE    Bilirubin Urine NEGATIVE  NEGATIVE    Ketones, ur NEGATIVE  NEGATIVE (mg/dL)   Protein, ur 454 (*) NEGATIVE (mg/dL)   Urobilinogen, UA 1.0  0.0 - 1.0 (mg/dL)   Nitrite NEGATIVE  NEGATIVE    Leukocytes, UA MODERATE (*) NEGATIVE   URINE MICROSCOPIC-ADD ON     Status: Abnormal   Collection Time   01/15/12  1:18 PM       Component Value Range   Squamous Epithelial / LPF FEW (*) RARE    WBC, UA TOO NUMEROUS TO COUNT  <3 (WBC/hpf)   RBC / HPF 7-10  <3 (RBC/hpf)   Bacteria, UA MANY (*) RARE    Urine-Other MUCOUS PRESENT    MRSA PCR SCREENING     Status: Normal   Collection Time   01/15/12  3:50 PM      Component Value Range   MRSA by PCR NEGATIVE  NEGATIVE   CARDIAC PANEL(CRET KIN+CKTOT+MB+TROPI)     Status: Normal   Collection Time   01/15/12  3:51 PM      Component Value Range   Total CK 17  7 - 232 (U/L)   CK, MB 0.7  0.3 - 4.0 (ng/mL)   Troponin I <0.30  <0.30 (ng/mL)   Relative Index RELATIVE INDEX IS INVALID  0.0 - 2.5   HEPATIC FUNCTION PANEL     Status: Abnormal   Collection Time   01/15/12  3:52 PM      Component Value Range   Total Protein 7.1  6.0 - 8.3 (g/dL)   Albumin 2.7 (*) 3.5 - 5.2 (g/dL)   AST 17  0 - 37 (U/L)   ALT 15  0 - 53 (U/L)   Alkaline Phosphatase 109  39 - 117 (U/L)   Total Bilirubin 1.1  0.3 - 1.2 (mg/dL)   Bilirubin, Direct 0.2  0.0 - 0.3 (mg/dL)   Indirect Bilirubin 0.9  0.3 - 0.9 (mg/dL)  MAGNESIUM     Status: Normal   Collection Time   01/15/12  3:52 PM      Component Value Range   Magnesium 1.9  1.5 - 2.5 (mg/dL)  TSH     Status: Normal   Collection Time   01/15/12  3:52 PM  Component Value Range   TSH 1.034  0.350 - 4.500 (uIU/mL)  GLUCOSE, CAPILLARY     Status: Abnormal   Collection Time   01/15/12  5:07 PM      Component Value Range   Glucose-Capillary 150 (*) 70 - 99 (mg/dL)  GLUCOSE, CAPILLARY     Status: Abnormal   Collection Time   01/15/12  9:28 PM      Component Value Range   Glucose-Capillary 177 (*) 70 - 99 (mg/dL)  CARDIAC PANEL(CRET KIN+CKTOT+MB+TROPI)     Status: Normal   Collection Time   01/15/12 11:10 PM      Component Value Range   Total CK 17  7 - 232 (U/L)   CK, MB 0.9  0.3 - 4.0 (ng/mL)   Troponin I <0.30  <0.30 (ng/mL)   Relative Index RELATIVE INDEX IS INVALID  0.0 - 2.5   COMPREHENSIVE METABOLIC PANEL     Status: Abnormal    Collection Time   01/16/12  6:50 AM      Component Value Range   Sodium 136  135 - 145 (mEq/L)   Potassium 4.1  3.5 - 5.1 (mEq/L)   Chloride 102  96 - 112 (mEq/L)   CO2 25  19 - 32 (mEq/L)   Glucose, Bld 226 (*) 70 - 99 (mg/dL)   BUN 13  6 - 23 (mg/dL)   Creatinine, Ser 1.61  0.50 - 1.35 (mg/dL)   Calcium 8.9  8.4 - 09.6 (mg/dL)   Total Protein 6.8  6.0 - 8.3 (g/dL)   Albumin 2.5 (*) 3.5 - 5.2 (g/dL)   AST 11  0 - 37 (U/L)   ALT 13  0 - 53 (U/L)   Alkaline Phosphatase 95  39 - 117 (U/L)   Total Bilirubin 0.5  0.3 - 1.2 (mg/dL)   GFR calc non Af Amer 82 (*) >90 (mL/min)   GFR calc Af Amer >90  >90 (mL/min)  CBC     Status: Normal   Collection Time   01/16/12  6:50 AM      Component Value Range   WBC 10.4  4.0 - 10.5 (K/uL)   RBC 4.81  4.22 - 5.81 (MIL/uL)   Hemoglobin 14.3  13.0 - 17.0 (g/dL)   HCT 04.5  40.9 - 81.1 (%)   MCV 89.8  78.0 - 100.0 (fL)   MCH 29.7  26.0 - 34.0 (pg)   MCHC 33.1  30.0 - 36.0 (g/dL)   RDW 91.4  78.2 - 95.6 (%)   Platelets 236  150 - 400 (K/uL)  CARDIAC PANEL(CRET KIN+CKTOT+MB+TROPI)     Status: Normal   Collection Time   01/16/12  6:50 AM      Component Value Range   Total CK 16  7 - 232 (U/L)   CK, MB 1.0  0.3 - 4.0 (ng/mL)   Troponin I <0.30  <0.30 (ng/mL)   Relative Index RELATIVE INDEX IS INVALID  0.0 - 2.5   GLUCOSE, CAPILLARY     Status: Abnormal   Collection Time   01/16/12  7:31 AM      Component Value Range   Glucose-Capillary 189 (*) 70 - 99 (mg/dL)   Comment 1 Notify RN       Micro: Recent Results (from the past 240 hour(s))  URINE CULTURE     Status: Normal   Collection Time   01/06/12  7:45 PM      Component Value Range Status Comment   Specimen Description URINE, RANDOM  Final    Special Requests NONE   Final    Culture  Setup Time [id number]   Final    Colony Count NO GROWTH   Final    Culture NO GROWTH   Final    Report Status 01/07/2012 FINAL   Final   MRSA PCR SCREENING     Status: Normal   Collection Time    01/15/12  3:50 PM      Component Value Range Status Comment   MRSA by PCR NEGATIVE  NEGATIVE  Final     Studies/Results: Ct Abdomen Pelvis Wo Contrast  01/15/2012  *RADIOLOGY REPORT*  Clinical Data: Renal stone, generalized weakness, nausea, vomiting  CT ABDOMEN AND PELVIS WITHOUT CONTRAST  Technique:  Multidetector CT imaging of the abdomen and pelvis was performed following the standard protocol without intravenous contrast.  Comparison: CT abdomen pelvis - 09/12/2011  Findings:  The lack of intravenous contrast limits the ability to evaluate solid abdominal organs.  Normal hepatic contour.  Cholelithiasis without evidence of cholecystitis on this noncontrast examination.  No ascites.  There is approximately 3 mm stone within the distal aspect the right ureter (image 63, series 2) which results in mild upstream ureterectasis and pelvicaliectasis and slightly asymmetric right- sided perinephric stranding.  There is an additional approximately 3 mm nonobstructing stone within the superior calix of the right kidney (image 26).  Ill-defined hypoattenuating lesion in the superior pole right kidney correlates with the previously characterized right-sided renal cyst.  There is unchanged mild left- sided likely age related perinephric stranding.  No left-sided urinary obstruction.  The noncontrast appearance of the bilateral adrenal glands, pancreas and spleen is normal.  Interval right-sided left-sided inguinal hernia repair with minimal amount of fluid seen in the distal aspect of the inguinal canal. Colonic diverticulosis without evidence of diverticulitis on noncontrast examination.  Bowel is otherwise normal in course and caliber without wall thickening or evidence of obstruction.  The cecum is again noted to be located within the right upper abdominal quadrant.  The bowel is otherwise normal in course and caliber without wall thickening or evidence of obstruction.  Normal appendix.  No pneumoperitoneum,  pneumatosis or portal venous gas.  There is grossly unchanged mild aneurysmal dilatation of the infrarenal abdominal aorta, measuring approximately 2.9 x 3.2 cm in greatest transverse axial dimension (image 38, series 2).  Minimal increase in right common iliac lymph node, measuring 8 mm in short axis diameter (image 45, series 2), previously, 6 mm, presumably reactive in etiology and is not enlarged by CT criteria. Additional shoddy retroperitoneal, mesenteric, pelvic and inguinal lymph nodes are not enlarged by CT criteria.  The prostate remains enlarged with mass effect upon the under surface of the bladder. No free fluid within the pelvis.  Limited visualization of the lower thorax demonstrates unchanged moderate paraseptal emphysema.  No definite focal airspace opacity or pleural effusion.  Normal heart size.  No pericardial effusion.  No acute or aggressive osseous abnormalities.  Redemonstrated moderate multilevel DDD, worst at L4 and L5 with mild (2 to 3 mm) retrolisthesis at this level.  IMPRESSION: 1.  Approximately 3 mm stone within distal aspect the right ureter results in mild upstream ureterectasis and pelvicaliectasis.  2.  Additional nonobstructing right-sided renal stone.  3.  Unchanged small infrarenal abdominal aortic aneurysm measuring approximately 3.2 cm in greatest transverse axial dimension.  4.  Colonic diverticulosis without evidence of diverticulitis. 5.  Cholelithiasis without evidence of cholecystitis. 6.  Post left-sided inguinal hernia repair. No  evidence of enteric obstruction.  Original Report Authenticated By: Waynard Reeds, M.D.   Dg Abd Acute W/chest  01/15/2012  *RADIOLOGY REPORT*  Clinical Data: Shortness of breath.  Nausea vomiting and diarrhea.  ACUTE ABDOMEN SERIES (ABDOMEN 2 VIEW & CHEST 1 VIEW)  Comparison: 09/12/2011  Findings: The heart size appears normal.  No pleural effusion or edema identified.  Chronic interstitial coarsening is noted bilaterally.  No focal  airspace consolidation.  No dilated loops of small bowel identified.  There is a single dilated loop of small bowel within the left upper quadrant of the abdomen.  This measures up to 4.2 cm.  Gas and stool noted within the colon up to the rectum.  IMPRESSION:  1.  Nonspecific bowel gas pattern. 2.  Severe emphysema and chronic interstitial coarsening/scarring.  Original Report Authenticated By: Rosealee Albee, M.D.    Medications:  Scheduled Meds:   . cefTRIAXone (ROCEPHIN)  IV  1 g Intravenous Once  . cefTRIAXone (ROCEPHIN)  IV  1 g Intravenous Q24H  . citalopram  10 mg Oral Daily  . enoxaparin  40 mg Subcutaneous Q24H  . fentaNYL  50 mcg Intravenous Once  . Fluticasone-Salmeterol  1 puff Inhalation Q12H  . insulin aspart  0-15 Units Subcutaneous TID WC  . methylPREDNISolone (SOLU-MEDROL) injection  40 mg Intravenous Q12H  .  morphine injection  4 mg Intravenous Once  . ondansetron  4 mg Intravenous Once  . pantoprazole  40 mg Oral Q1200  . simvastatin  40 mg Oral q1800  . DISCONTD: cefTRIAXone (ROCEPHIN)  IV  1 g Intravenous Q24H  . DISCONTD: ipratropium  0.5 mg Nebulization Q6H  . DISCONTD: levalbuterol  0.63 mg Nebulization Q6H   Continuous Infusions:   . 0.9 % NaCl with KCl 20 mEq / L 1,000 mL (01/16/12 0504)   PRN Meds:.acetaminophen, acetaminophen, ipratropium, levalbuterol, ondansetron (ZOFRAN) IV, ondansetron, traMADol   Assessment: Active Problems:  DIABETES MELLITUS, TYPE II  COPD  Nausea vomiting and diarrhea  UTI (urinary tract infection)   Plan: #1 UTI/ pyelonephritis, the patient does have some right-sided perinephric stranding. patient has a received Rocephin in the ED, urine culture has been sent. The patient did have of right renal stone measuring 3 mm on the CT scan of the abdomen on 09/12/2011. Nonobstructing stone on the CAT scan,  If indicated a urology consultation will be obtained if patient continues to have persistent pain.  #2 intractable nausea  vomiting could be attributed to UTI or Pyelonephritis?   #3 COPD patient for her mild wheezing therefore will be started on low-dose Solu-Medrol, chest x-ray is clear without any evidence of obvious pneumonia   #4 type 2 diabetes,CBG appear controlled will hold metformin and start the patient on sliding scale insulin  He is a full code       LOS: 1 day   [facility] 01/16/2012, 8:50 AM

## 2012-01-16 NOTE — Progress Notes (Signed)
Inpatient Diabetes Program Recommendations  AACE/ADA: New Consensus Statement on Inpatient Glycemic Control (2009)  Target Ranges:  Prepandial:   less than 140 mg/dL      Peak postprandial:   less than 180 mg/dL (1-2 hours)      Critically ill patients:  140 - 180 mg/dL   Reason for Visit: Hyperglycemia with solumedrol  Inpatient Diabetes Program Recommendations Correction (SSI): May benefit from increase to resistant scale while on solumedrol. Insulin - Meal Coverage: Please consider meal coverage while on steroids. HgbA1C: No A1c found since 07/2011.  Request.   Note: Nurse states patient concerned regarding spike in CBG's-- especially since his spouse died recently with complications of diabetic coma. Results for PAO, HAFFEY (MRN [id number]) as of 01/16/2012 14:44  Ref. Range  01/15/2012 17:07 01/15/2012 21:28 01/16/2012 07:31 01/16/2012 11:54  Glucose-Capillary Latest Range: 70-99 mg/dL  657 (H) 846 (H) 962 (H) 305 (H)   Thank you. Marlyss Cissell S. Elsie Lincoln, RN, CNS, CDE  [phone])

## 2012-01-17 ENCOUNTER — Inpatient Hospital Stay (HOSPITAL_COMMUNITY): Payer: Medicare (Managed Care)

## 2012-01-17 LAB — URINE CULTURE: Culture  Setup Time: 201304281759

## 2012-01-17 LAB — GLUCOSE, CAPILLARY
Glucose-Capillary: 227 mg/dL — ABNORMAL HIGH (ref 70–99)
Glucose-Capillary: 154 mg/dL — ABNORMAL HIGH (ref 70–99)
Glucose-Capillary: 309 mg/dL — ABNORMAL HIGH (ref 70–99)
Glucose-Capillary: 236 mg/dL — ABNORMAL HIGH (ref 70–99)

## 2012-01-17 MED ORDER — LORAZEPAM 1 MG PO TABS
1.0000 mg | ORAL_TABLET | Freq: Once | ORAL | Status: AC
  Administered 2012-01-17: 1 mg via ORAL
  Filled 2012-01-17: qty 1

## 2012-01-17 MED ORDER — GUAIFENESIN-CODEINE 100-10 MG/5ML PO SOLN: 5.0000 mL | Freq: Four times a day (QID) | ORAL | Status: DC | PRN

## 2012-01-17 NOTE — Progress Notes (Signed)
Subjective: Denies any nausea vomiting diarrhea  Objective: Vital signs in last 24 hours: Filed Vitals:   01/16/12 2027 01/16/12 2115 01/17/12 0547 01/17/12 0941  BP:  122/71 125/75   Pulse:  96 84   Temp:  97.7 F (36.5 C) 97.6 F (36.4 C)   TempSrc:  Oral Oral   Resp:  18 18   Height:      Weight:      SpO2: 98% 95% 98% 96%    Intake/Output Summary (Last 24 hours) at 01/17/12 1129 Last data filed at 01/17/12 4540  Gross per 24 hour  Intake 2288.75 ml  Output      0 ml  Net 2288.75 ml    Weight change:   Nursing note and vitals reviewed.  Constitutional: He is oriented to person, place, and time. He appears well-developed and well-nourished.  HENT:  Head: Normocephalic and atraumatic.  Eyes: EOM are normal. Pupils are equal, round, and reactive to light.  Neck: Normal range of motion. Neck supple.  Cardiovascular: Normal rate, regular rhythm and normal heart sounds.  No murmur heard.  Pulmonary/Chest: Effort normal. He has wheezes.  Diffuse harsh breath sounds.  Abdominal: Soft. Bowel sounds are normal. He exhibits no distension and no mass. There is tenderness. There is no rebound and no guarding.  Mild diffuse tenderness without rebound or guarding.  Musculoskeletal: Normal range of motion. He exhibits no edema.  Neurological: He is alert and oriented to person, place, and time. No cranial nerve deficit.  Skin: Skin is warm and dry.      Lab Results: Results for orders placed during the hospital encounter of 01/15/12 (from the past 24 hour(s))  GLUCOSE, CAPILLARY     Status: Abnormal   Collection Time   01/16/12 11:54 AM      Component Value Range   Glucose-Capillary 305 (*) 70 - 99 (mg/dL)   Comment 1 Notify RN    GLUCOSE, CAPILLARY     Status: Abnormal   Collection Time   01/16/12  5:25 PM      Component Value Range   Glucose-Capillary 192 (*) 70 - 99 (mg/dL)  GLUCOSE, CAPILLARY     Status: Abnormal   Collection Time   01/16/12  9:32 PM      Component  Value Range   Glucose-Capillary 227 (*) 70 - 99 (mg/dL)   Comment 1 Documented in Chart     Comment 2 Notify RN    GLUCOSE, CAPILLARY     Status: Abnormal   Collection Time   01/17/12  7:30 AM      Component Value Range   Glucose-Capillary 154 (*) 70 - 99 (mg/dL)     Micro: Recent Results (from the past 240 hour(s))  URINE CULTURE     Status: Normal   Collection Time   01/15/12  1:18 PM      Component Value Range Status Comment   Specimen Description URINE, CLEAN CATCH   Final    Special Requests NONE   Final    Culture  Setup Time [id number]   Final    Colony Count >=100,000 COLONIES/ML   Final    Culture ESCHERICHIA COLI   Final    Report Status 01/17/2012 FINAL   Final    Organism ID, Bacteria ESCHERICHIA COLI   Final   MRSA PCR SCREENING     Status: Normal   Collection Time   01/15/12  3:50 PM      Component Value Range Status Comment  MRSA by PCR NEGATIVE  NEGATIVE  Final     Studies/Results: Ct Abdomen Pelvis Wo Contrast  01/15/2012  *RADIOLOGY REPORT*  Clinical Data: Renal stone, generalized weakness, nausea, vomiting  CT ABDOMEN AND PELVIS WITHOUT CONTRAST  Technique:  Multidetector CT imaging of the abdomen and pelvis was performed following the standard protocol without intravenous contrast.  Comparison: CT abdomen pelvis - 09/12/2011  Findings:  The lack of intravenous contrast limits the ability to evaluate solid abdominal organs.  Normal hepatic contour.  Cholelithiasis without evidence of cholecystitis on this noncontrast examination.  No ascites.  There is approximately 3 mm stone within the distal aspect the right ureter (image 63, series 2) which results in mild upstream ureterectasis and pelvicaliectasis and slightly asymmetric right- sided perinephric stranding.  There is an additional approximately 3 mm nonobstructing stone within the superior calix of the right kidney (image 26).  Ill-defined hypoattenuating lesion in the superior pole right kidney correlates  with the previously characterized right-sided renal cyst.  There is unchanged mild left- sided likely age related perinephric stranding.  No left-sided urinary obstruction.  The noncontrast appearance of the bilateral adrenal glands, pancreas and spleen is normal.  Interval right-sided left-sided inguinal hernia repair with minimal amount of fluid seen in the distal aspect of the inguinal canal. Colonic diverticulosis without evidence of diverticulitis on noncontrast examination.  Bowel is otherwise normal in course and caliber without wall thickening or evidence of obstruction.  The cecum is again noted to be located within the right upper abdominal quadrant.  The bowel is otherwise normal in course and caliber without wall thickening or evidence of obstruction.  Normal appendix.  No pneumoperitoneum, pneumatosis or portal venous gas.  There is grossly unchanged mild aneurysmal dilatation of the infrarenal abdominal aorta, measuring approximately 2.9 x 3.2 cm in greatest transverse axial dimension (image 38, series 2).  Minimal increase in right common iliac lymph node, measuring 8 mm in short axis diameter (image 45, series 2), previously, 6 mm, presumably reactive in etiology and is not enlarged by CT criteria. Additional shoddy retroperitoneal, mesenteric, pelvic and inguinal lymph nodes are not enlarged by CT criteria.  The prostate remains enlarged with mass effect upon the under surface of the bladder. No free fluid within the pelvis.  Limited visualization of the lower thorax demonstrates unchanged moderate paraseptal emphysema.  No definite focal airspace opacity or pleural effusion.  Normal heart size.  No pericardial effusion.  No acute or aggressive osseous abnormalities.  Redemonstrated moderate multilevel DDD, worst at L4 and L5 with mild (2 to 3 mm) retrolisthesis at this level.  IMPRESSION: 1.  Approximately 3 mm stone within distal aspect the right ureter results in mild upstream ureterectasis and  pelvicaliectasis.  2.  Additional nonobstructing right-sided renal stone.  3.  Unchanged small infrarenal abdominal aortic aneurysm measuring approximately 3.2 cm in greatest transverse axial dimension.  4.  Colonic diverticulosis without evidence of diverticulitis. 5.  Cholelithiasis without evidence of cholecystitis. 6.  Post left-sided inguinal hernia repair. No evidence of enteric obstruction.  Original Report Authenticated By: Waynard Reeds, M.D.   Dg Abd Acute W/chest  01/15/2012  *RADIOLOGY REPORT*  Clinical Data: Shortness of breath.  Nausea vomiting and diarrhea.  ACUTE ABDOMEN SERIES (ABDOMEN 2 VIEW & CHEST 1 VIEW)  Comparison: 09/12/2011  Findings: The heart size appears normal.  No pleural effusion or edema identified.  Chronic interstitial coarsening is noted bilaterally.  No focal airspace consolidation.  No dilated loops of small bowel  identified.  There is a single dilated loop of small bowel within the left upper quadrant of the abdomen.  This measures up to 4.2 cm.  Gas and stool noted within the colon up to the rectum.  IMPRESSION:  1.  Nonspecific bowel gas pattern. 2.  Severe emphysema and chronic interstitial coarsening/scarring.  Original Report Authenticated By: Rosealee Albee, M.D.    Medications:  Scheduled Meds:   . cefTRIAXone (ROCEPHIN)  IV  1 g Intravenous Q24H  . citalopram  10 mg Oral Daily  . enoxaparin  40 mg Subcutaneous Q24H  . Fluticasone-Salmeterol  1 puff Inhalation Q12H  . insulin aspart  0-15 Units Subcutaneous TID WC  . LORazepam  1 mg Oral Once  . methylPREDNISolone (SOLU-MEDROL) injection  40 mg Intravenous Q12H  . pantoprazole  40 mg Oral Q1200  . simethicone  160 mg Oral TID PC & HS  . simvastatin  40 mg Oral q1800  . Tamsulosin HCl  0.4 mg Oral QPC supper   Continuous Infusions:   . 0.9 % NaCl with KCl 20 mEq / L 75 mL/hr at 01/17/12 0451   PRN Meds:.acetaminophen, acetaminophen, guaiFENesin-codeine, ipratropium, levalbuterol, ondansetron  (ZOFRAN) IV, ondansetron, traMADol   Assessment: Active Problems:  DIABETES MELLITUS, TYPE II  COPD  Nausea vomiting and diarrhea  UTI (urinary tract infection)   Plan: #1 UTIs patient has been on Rocephin,, urine culture growing Escherichia coli sensitive to Rocephin. The patient did have of right renal stone measuring 3 mm on the CT scan of the abdomen on 09/12/2011. We will repeat a renal ultrasound, to ensure that the patient does not have any hydronephrosis at this time. If indicated a urology consultation will be obtained tomorrow  #2 intractable nausea vomiting could be attributed to UTI or Pyelonephritis? Improving advance diet  #3 COPD patient for her mild wheezing therefore will be started on low-dose Solu-Medrol, chest x-ray is clear without any evidence of obvious pneumonia . Patient is coughing today and wants a repeat chest x-ray to ensure that he does not have a pneumonia  #4 type 2 diabetes,CBG appear controlled will hold metformin and start the patient on sliding scale insulin  He is a full code Anticipate discharge 1-2 days   LOS: 2 days   [facility] 01/17/2012, 11:29 AM

## 2012-01-18 DIAGNOSIS — R5381 Other malaise: Secondary | ICD-10-CM

## 2012-01-18 LAB — GLUCOSE, CAPILLARY: Glucose-Capillary: 179 mg/dL — ABNORMAL HIGH (ref 70–99)

## 2012-01-18 MED ORDER — LORAZEPAM 1 MG PO TABS
1.0000 mg | ORAL_TABLET | Freq: Once | ORAL | Status: AC
  Administered 2012-01-18: 1 mg via ORAL
  Filled 2012-01-18: qty 1

## 2012-01-18 NOTE — Progress Notes (Signed)
Subjective: Patient well-known to me a social level after having cared for his now deceased wife for many years. Patient states that he is tolerating his diet better. He's had no further vomiting or diarrhea. He is still occasionally is having some nausea. Objective: Filed Vitals:   01/17/12 2210 01/18/12 0611 01/18/12 0803 01/18/12 1354  BP: 142/84 132/80  125/78  Pulse: 100 90  89  Temp: 97.5 F (36.4 C) 97.3 F (36.3 C)  98 F (36.7 C)  TempSrc: Oral Oral  Oral  Resp: 18 19  20   Height:      Weight:      SpO2: 95% 95% 96% 93%   Weight change:   Intake/Output Summary (Last 24 hours) at 01/18/12 1846 Last data filed at 01/18/12 1700  Gross per 24 hour  Intake 2018.75 ml  Output      0 ml  Net 2018.75 ml    General: Alert, awake, oriented x3, in no acute distress.  HEENT: /AT PEERL, EOMI Neck: Trachea midline,  no masses, no thyromegal,y no JVD, no carotid bruit OROPHARYNX:  Moist, No exudate/ erythema/lesions.  Heart: Regular rate and rhythm, without murmurs, rubs, gallops, PMI non-displaced, no heaves or thrills on palpation.  Lungs: Clear to auscultation, no wheezing or rhonchi noted. No increased vocal fremitus resonant to percussion  Abdomen: Soft, nontender, nondistended, positive bowel sounds, no masses no hepatosplenomegaly noted..  Neuro: No focal neurological deficits noted cranial nerves II through XII grossly intact. DTRs 2+ bilaterally upper and lower extremities. Strength functional in bilateral upper and lower extremities. Musculoskeletal: No warm swelling or erythema around joints, no spinal tenderness noted.   Lab Results:  [facility] 01/16/12 0650  NA 136  K 4.1  CL 102  CO2 25  GLUCOSE 226*  BUN 13  CREATININE 0.87  CALCIUM 8.9  MG --  PHOS --    Basename 01/16/12 0650  AST 11  ALT 13  ALKPHOS 95  BILITOT 0.5  PROT 6.8  ALBUMIN 2.5*   No results found for this basename: LIPASE:2,AMYLASE:2 in the last 72 hours  Basename 01/16/12 0650    WBC 10.4  NEUTROABS --  HGB 14.3  HCT 43.2  MCV 89.8  PLT 236    Basename 01/16/12 0650 01/15/12 2310  CKTOTAL 16 17  CKMB 1.0 0.9  CKMBINDEX -- --  TROPONINI <0.30 <0.30   No components found with this basename: POCBNP:3 No results found for this basename: DDIMER:2 in the last 72 hours No results found for this basename: HGBA1C:2 in the last 72 hours No results found for this basename: CHOL:2,HDL:2,LDLCALC:2,TRIG:2,CHOLHDL:2,LDLDIRECT:2 in the last 72 hours No results found for this basename: TSH,T4TOTAL,FREET3,T3FREE,THYROIDAB in the last 72 hours No results found for this basename: VITAMINB12:2,FOLATE:2,FERRITIN:2,TIBC:2,IRON:2,RETICCTPCT:2 in the last 72 hours  Micro Results: Recent Results (from the past 240 hour(s))  URINE CULTURE     Status: Normal   Collection Time   01/15/12  1:18 PM      Component Value Range Status Comment   Specimen Description URINE, CLEAN CATCH   Final    Special Requests NONE   Final    Culture  Setup Time [id number]   Final    Colony Count >=100,000 COLONIES/ML   Final    Culture ESCHERICHIA COLI   Final    Report Status 01/17/2012 FINAL   Final    Organism ID, Bacteria ESCHERICHIA COLI   Final   MRSA PCR SCREENING     Status: Normal   Collection Time   01/15/12  3:50 PM      Component Value Range Status Comment   MRSA by PCR NEGATIVE  NEGATIVE  Final     Studies/Results: Ct Abdomen Pelvis Wo Contrast  01/15/2012  *RADIOLOGY REPORT*  Clinical Data: Renal stone, generalized weakness, nausea, vomiting  CT ABDOMEN AND PELVIS WITHOUT CONTRAST  Technique:  Multidetector CT imaging of the abdomen and pelvis was performed following the standard protocol without intravenous contrast.  Comparison: CT abdomen pelvis - 09/12/2011  Findings:  The lack of intravenous contrast limits the ability to evaluate solid abdominal organs.  Normal hepatic contour.  Cholelithiasis without evidence of cholecystitis on this noncontrast examination.  No ascites.   There is approximately 3 mm stone within the distal aspect the right ureter (image 63, series 2) which results in mild upstream ureterectasis and pelvicaliectasis and slightly asymmetric right- sided perinephric stranding.  There is an additional approximately 3 mm nonobstructing stone within the superior calix of the right kidney (image 26).  Ill-defined hypoattenuating lesion in the superior pole right kidney correlates with the previously characterized right-sided renal cyst.  There is unchanged mild left- sided likely age related perinephric stranding.  No left-sided urinary obstruction.  The noncontrast appearance of the bilateral adrenal glands, pancreas and spleen is normal.  Interval right-sided left-sided inguinal hernia repair with minimal amount of fluid seen in the distal aspect of the inguinal canal. Colonic diverticulosis without evidence of diverticulitis on noncontrast examination.  Bowel is otherwise normal in course and caliber without wall thickening or evidence of obstruction.  The cecum is again noted to be located within the right upper abdominal quadrant.  The bowel is otherwise normal in course and caliber without wall thickening or evidence of obstruction.  Normal appendix.  No pneumoperitoneum, pneumatosis or portal venous gas.  There is grossly unchanged mild aneurysmal dilatation of the infrarenal abdominal aorta, measuring approximately 2.9 x 3.2 cm in greatest transverse axial dimension (image 38, series 2).  Minimal increase in right common iliac lymph node, measuring 8 mm in short axis diameter (image 45, series 2), previously, 6 mm, presumably reactive in etiology and is not enlarged by CT criteria. Additional shoddy retroperitoneal, mesenteric, pelvic and inguinal lymph nodes are not enlarged by CT criteria.  The prostate remains enlarged with mass effect upon the under surface of the bladder. No free fluid within the pelvis.  Limited visualization of the lower thorax demonstrates  unchanged moderate paraseptal emphysema.  No definite focal airspace opacity or pleural effusion.  Normal heart size.  No pericardial effusion.  No acute or aggressive osseous abnormalities.  Redemonstrated moderate multilevel DDD, worst at L4 and L5 with mild (2 to 3 mm) retrolisthesis at this level.  IMPRESSION: 1.  Approximately 3 mm stone within distal aspect the right ureter results in mild upstream ureterectasis and pelvicaliectasis.  2.  Additional nonobstructing right-sided renal stone.  3.  Unchanged small infrarenal abdominal aortic aneurysm measuring approximately 3.2 cm in greatest transverse axial dimension.  4.  Colonic diverticulosis without evidence of diverticulitis. 5.  Cholelithiasis without evidence of cholecystitis. 6.  Post left-sided inguinal hernia repair. No evidence of enteric obstruction.  Original Report Authenticated By: Waynard Reeds, M.D.   Dg Chest 2 View  01/17/2012  *RADIOLOGY REPORT*  Clinical Data: Weakness.  Shortness of breath.  Lower abdominal pain.  Rule out pneumonia.  History of diabetes, COPD.  Ex-smoker.  CHEST - 2 VIEW  Comparison: Numerous priors, most recent 01/06/2012  Findings: Hyperinflation is identified on the lateral views.  Lateral view degraded by patient arm position.  AP frontal view with apical lordotic positioning.  Tracheal deviation to the right.  Moderate cardiomegaly.  There is focal hyperaeration within the left upper lobe, with displacement of the left mediastinal contour into the right-sided chest.  This is unchanged.  The left costophrenic angle is excluded from the frontal film.  Given this factor, no pleural fluid. No pneumothorax.  Lower lobe predominant interstitial thickening.  Volume loss at the medial right lung base, with obscuration of the medial right hemidiaphragm.  This is similar over numerous prior exams, including back to 11/30/2008.  IMPRESSION:  1.  COPD/emphysema.  Chronic scarring.  No acute superimposed process. 2.  Cardiomegaly without congestive failure. 3.  Mildly degraded by patient positioning.  Original Report Authenticated By: Consuello Bossier, M.D.   Dg Chest 2 View  01/06/2012  *RADIOLOGY REPORT*  Clinical Data: Dizziness and weakness.  CHEST - 2 VIEW  Comparison: Two-view chest 04/23/2011.  Findings: Severe emphysematous changes are evident.  Mild interstitial coarsening is chronic.  No focal airspace disease is evident.  Extension of the left lung across midline is stable.  IMPRESSION:  1.  Stable appearance of severe emphysema and chronic interstitial coarsening. 2.  No acute cardiopulmonary disease.  Original Report Authenticated By: Jamesetta Orleans. MATTERN, M.D.   Dg Abd Acute W/chest  01/15/2012  *RADIOLOGY REPORT*  Clinical Data: Shortness of breath.  Nausea vomiting and diarrhea.  ACUTE ABDOMEN SERIES (ABDOMEN 2 VIEW & CHEST 1 VIEW)  Comparison: 09/12/2011  Findings: The heart size appears normal.  No pleural effusion or edema identified.  Chronic interstitial coarsening is noted bilaterally.  No focal airspace consolidation.  No dilated loops of small bowel identified.  There is a single dilated loop of small bowel within the left upper quadrant of the abdomen.  This measures up to 4.2 cm.  Gas and stool noted within the colon up to the rectum.  IMPRESSION:  1.  Nonspecific bowel gas pattern. 2.  Severe emphysema and chronic interstitial coarsening/scarring.  Original Report Authenticated By: Rosealee Albee, M.D.    Medications: I have reviewed the patient's current medications. Scheduled Meds:   . cefTRIAXone (ROCEPHIN)  IV  1 g Intravenous Q24H  . citalopram  10 mg Oral Daily  . enoxaparin  40 mg Subcutaneous Q24H  . Fluticasone-Salmeterol  1 puff Inhalation Q12H  . insulin aspart  0-15 Units Subcutaneous TID WC  . LORazepam  1 mg Oral Once  . methylPREDNISolone (SOLU-MEDROL) injection  40 mg Intravenous Q12H  . pantoprazole  40 mg Oral Q1200  . simethicone  160 mg Oral TID PC & HS  .  simvastatin  40 mg Oral q1800  . Tamsulosin HCl  0.4 mg Oral QPC supper   Continuous Infusions:   . 0.9 % NaCl with KCl 20 mEq / L 75 mL/hr at 01/18/12 0519   PRN Meds:.acetaminophen, acetaminophen, guaiFENesin-codeine, ipratropium, levalbuterol, ondansetron (ZOFRAN) IV, ondansetron, traMADol Assessment/Plan: Patient Active Hospital Problem List: UTI (urinary tract infection) (01/15/2012)   Assessment: Patient presently on Rocephin day #3. We'll transition to ciprofloxacin likely tomorrow the patient has demonstrated he has no nausea vomiting 24 hours.     Nausea vomiting and diarrhea (01/15/2012)   Assessment: The patient's symptoms almost completely resolved. He's tolerating his diet well. I suspect that this is likely due to symptoms precipitated by his urinary tract infection.     DIABETES MELLITUS, TYPE II (11/03/2009)   Assessment: Blood sugars much improved today. We'll  continue current     COPD (11/03/2009)   Assessment: Quiescent     Disposition: Anticipate discharge to home with the next 24-48 hours.   LOS: 3 days

## 2012-01-19 DIAGNOSIS — R5381 Other malaise: Secondary | ICD-10-CM

## 2012-01-19 DIAGNOSIS — R112 Nausea with vomiting, unspecified: Secondary | ICD-10-CM

## 2012-01-19 LAB — GLUCOSE, CAPILLARY: Glucose-Capillary: 206 mg/dL — ABNORMAL HIGH (ref 70–99)

## 2012-01-19 MED ORDER — CIPROFLOXACIN HCL 250 MG PO TABS: 250.0000 mg | ORAL_TABLET | Freq: Two times a day (BID) | ORAL | Status: AC

## 2012-01-19 MED ORDER — TAMSULOSIN HCL 0.4 MG PO CAPS: 0.4000 mg | ORAL_CAPSULE | Freq: Every day | ORAL | Status: DC

## 2012-01-19 NOTE — Progress Notes (Signed)
Patient discharged to home in stable condition.  Discharge instructions explained with verbal understanding.

## 2012-01-19 NOTE — Progress Notes (Signed)
[name] [name]  MRN: [id number]  DOB/AGE: 04/20/1934 78 y.o.  Admit date: 01/15/2012  Discharge date: 01/19/2012  Primary Care Physician: James John, MD, MD  Discharge Diagnoses:  Patient Active Problem List   Diagnoses   .  DIABETES MELLITUS, TYPE II   .  HYPERLIPIDEMIA   .  ANXIETY   .  DEPRESSION   .  RESTLESS LEG SYNDROME   .  HYPERTENSION   .  EMPHYSEMA, BULLOUS   .  ASTHMA   .  COPD   .  GERD   .  PEPTIC ULCER DISEASE   .  DISC DISEASE, LUMBAR   .  SPINAL STENOSIS, LUMBAR   .  NEPHROLITHIASIS, HX OF   .  Orthostasis   .  Dehydration   .  Abdominal pain, other specified site   .  Dizziness   .  Weight loss   .  Left lumbar radiculopathy   .  Cocaine abuse   .  Tobacco abuse   .  Nausea vomiting and diarrhea   .  UTI (urinary tract infection)    DISCHARGE MEDICATION:  Medication List  As of 01/19/2012 11:09 AM    STOP taking these medications          amLODipine 5 MG tablet       TAKE these medications          ciprofloxacin 250 MG tablet      Commonly known as: CIPRO      Take 1 tablet (250 mg total) by mouth 2 (two) times daily.      citalopram 10 MG tablet      Commonly known as: CELEXA      Take 1 tablet (10 mg total) by mouth daily.      Fluticasone-Salmeterol 100-50 MCG/DOSE Aepb      Commonly known as: ADVAIR      Inhale 1 puff into the lungs every 12 (twelve) hours.      glucose blood test strip      Use as instructed      metFORMIN 500 MG (MOD) 24 hr tablet      Commonly known as: GLUMETZA      Take 2 tablets (1,000 mg total) by mouth daily.      omeprazole 20 MG capsule      Commonly known as: PRILOSEC      Take 1 capsule (20 mg total) by mouth daily.      simvastatin 40 MG tablet      Commonly known as: ZOCOR      Take 1 tablet (40 mg total) by mouth daily.      Tamsulosin HCl 0.4 MG Caps      Commonly known as: FLOMAX      Take 1 capsule (0.4 mg total) by mouth daily after supper.         SIGNIFICANT DIAGNOSTIC STUDIES:  Ct Abdomen Pelvis Wo  Contrast  01/15/2012 *RADIOLOGY REPORT* Clinical Data: Renal stone, generalized weakness, nausea, vomiting CT ABDOMEN AND PELVIS WITHOUT CONTRAST Technique: Multidetector CT imaging of the abdomen and pelvis was performed following the standard protocol without intravenous contrast. Comparison: CT abdomen pelvis - 09/12/2011 Findings: The lack of intravenous contrast limits the ability to evaluate solid abdominal organs. Normal hepatic contour. Cholelithiasis without evidence of cholecystitis on this noncontrast examination. No ascites. There is approximately 3 mm stone within the distal aspect the right ureter (image 63, series 2) which results in mild upstream ureterectasis and pelvicaliectasis and slightly   asymmetric right- sided perinephric stranding. There is an additional approximately 3 mm nonobstructing stone within the superior calix of the right kidney (image 26). Ill-defined hypoattenuating lesion in the superior pole right kidney correlates with the previously characterized right-sided renal cyst. There is unchanged mild left- sided likely age related perinephric stranding. No left-sided urinary obstruction. The noncontrast appearance of the bilateral adrenal glands, pancreas and spleen is normal. Interval right-sided left-sided inguinal hernia repair with minimal amount of fluid seen in the distal aspect of the inguinal canal. Colonic diverticulosis without evidence of diverticulitis on noncontrast examination. Bowel is otherwise normal in course and caliber without wall thickening or evidence of obstruction. The cecum is again noted to be located within the right upper abdominal quadrant. The bowel is otherwise normal in course and caliber without wall thickening or evidence of obstruction. Normal appendix. No pneumoperitoneum, pneumatosis or portal venous gas. There is grossly unchanged mild aneurysmal dilatation of the infrarenal abdominal aorta, measuring approximately 2.9 x 3.2 cm in greatest  transverse axial dimension (image 38, series 2). Minimal increase in right common iliac lymph node, measuring 8 mm in short axis diameter (image 45, series 2), previously, 6 mm, presumably reactive in etiology and is not enlarged by CT criteria. Additional shoddy retroperitoneal, mesenteric, pelvic and inguinal lymph nodes are not enlarged by CT criteria. The prostate remains enlarged with mass effect upon the under surface of the bladder. No free fluid within the pelvis. Limited visualization of the lower thorax demonstrates unchanged moderate paraseptal emphysema. No definite focal airspace opacity or pleural effusion. Normal heart size. No pericardial effusion. No acute or aggressive osseous abnormalities. Redemonstrated moderate multilevel DDD, worst at L4 and L5 with mild (2 to 3 mm) retrolisthesis at this level. IMPRESSION: 1. Approximately 3 mm stone within distal aspect the right ureter results in mild upstream ureterectasis and pelvicaliectasis. 2. Additional nonobstructing right-sided renal stone. 3. Unchanged small infrarenal abdominal aortic aneurysm measuring approximately 3.2 cm in greatest transverse axial dimension. 4. Colonic diverticulosis without evidence of diverticulitis. 5. Cholelithiasis without evidence of cholecystitis. 6. Post left-sided inguinal hernia repair. No evidence of enteric obstruction. Original Report Authenticated By: JOHN A. WATTS V, M.D.  Dg Chest 2 View  01/17/2012 *RADIOLOGY REPORT* Clinical Data: Weakness. Shortness of breath. Lower abdominal pain. Rule out pneumonia. History of diabetes, COPD. Ex-smoker. CHEST - 2 VIEW Comparison: Numerous priors, most recent 01/06/2012 Findings: Hyperinflation is identified on the lateral views. Lateral view degraded by patient arm position. AP frontal view with apical lordotic positioning. Tracheal deviation to the right. Moderate cardiomegaly. There is focal hyperaeration within the left upper lobe, with displacement of the left  mediastinal contour into the right-sided chest. This is unchanged. The left costophrenic angle is excluded from the frontal film. Given this factor, no pleural fluid. No pneumothorax. Lower lobe predominant interstitial thickening. Volume loss at the medial right lung base, with obscuration of the medial right hemidiaphragm. This is similar over numerous prior exams, including back to 11/30/2008. IMPRESSION: 1. COPD/emphysema. Chronic scarring. No acute superimposed process. 2. Cardiomegaly without congestive failure. 3. Mildly degraded by patient positioning. Original Report Authenticated By: KYLE D. TALBOT, M.D.  Dg Chest 2 View  01/06/2012 *RADIOLOGY REPORT* Clinical Data: Dizziness and weakness. CHEST - 2 VIEW Comparison: Two-view chest 04/23/2011. Findings: Severe emphysematous changes are evident. Mild interstitial coarsening is chronic. No focal airspace disease is evident. Extension of the left lung across midline is stable. IMPRESSION: 1. Stable appearance of severe emphysema and chronic interstitial coarsening. 2. No acute   cardiopulmonary disease. Original Report Authenticated By: CHRISTOPHER W. MATTERN, M.D.  Dg Abd Acute W/chest  01/15/2012 *RADIOLOGY REPORT* Clinical Data: Shortness of breath. Nausea vomiting and diarrhea. ACUTE ABDOMEN SERIES (ABDOMEN 2 VIEW & CHEST 1 VIEW) Comparison: 09/12/2011 Findings: The heart size appears normal. No pleural effusion or edema identified. Chronic interstitial coarsening is noted bilaterally. No focal airspace consolidation. No dilated loops of small bowel identified. There is a single dilated loop of small bowel within the left upper quadrant of the abdomen. This measures up to 4.2 cm. Gas and stool noted within the colon up to the rectum. IMPRESSION: 1. Nonspecific bowel gas pattern. 2. Severe emphysema and chronic interstitial coarsening/scarring. Original Report Authenticated By: TAYLOR H. STROUD, M.D.  Recent Results (from the past 240 hour(s))   URINE  CULTURE Status: Normal    Collection Time    01/15/12 1:18 PM   Component  Value  Range  Status  Comment    Specimen Description  URINE, CLEAN CATCH   Final     Special Requests  NONE   Final     Culture Setup Time  [id number]   Final     Colony Count  >=100,000 COLONIES/ML   Final     Culture  ESCHERICHIA COLI   Final     Report Status  01/17/2012 FINAL   Final     Organism ID, Bacteria  ESCHERICHIA COLI   Final    MRSA PCR SCREENING Status: Normal    Collection Time    01/15/12 3:50 PM   Component  Value  Range  Status  Comment    MRSA by PCR  NEGATIVE  NEGATIVE  Final     BRIEF ADMITTING H & P:  77-year-old male with a history of type 2 diabetes, COPD who presents to the ER with a chief complaint of generalized weakness, nausea, vomiting, diarrhea since Wednesday. His symptoms started a week and a half ago. He presented with similar symptoms on April 19, to the ER. The patient was found to have some microscopic hematuria, mild UTI, he was hydrated with IV fluids, was given a referral to follow up with urology outpatient and discharge from the ER.  He presents today with increasing urinary frequency, dysuria, low-grade fever, myalgias. He found her urinary tract infection. He complains of decrease in appetite. He also complains of inability to take care of himself at home. He denies any hematemesis, hematochezia, melena. He denies any chest pain but does complain of exertional shortness of breath for the last one week.  Hospital Course:  Present on Admission:  .E.Coli UTI: Pt was admitted with UTI. He was started empirically on Rocephin. Cultures revealed E. Coli which was sensitive to Rocephin. Pt was received rocephin for 3 days and will be discharged with ciprofloxacin to complete a total of 7 days.   .Nausea vomiting and diarrhea: Felt to be secondary to UTI.   .DIABETES MELLITUS, TYPE II: BS controlled. Will continue on Metformin.   .COPD: Quiscient   Disposition and Follow-up:    Follow up with Dr. John in 1 week.  DISCHARGE EXAM:  General: Alert, awake, oriented x3, in no acute distress.  Vital Signs: Blood pressure 131/71, pulse 89, temperature 97.7 F (36.5 C), temperature source Oral, resp. rate 19, height 5' 10" (1.778 m), weight 76.658 kg (169 lb), SpO2 93.00%.  HEENT: [place]/AT PEERL, EOMI  Neck: Trachea midline, no masses, no thyromegal,y no JVD, no carotid bruit  OROPHARYNX: Moist, No exudate/ erythema/lesions.  Heart: Regular   rate and rhythm, without murmurs, rubs, gallops, PMI non-displaced, no heaves or thrills on palpation.  Lungs: Clear to auscultation, no wheezing or rhonchi noted. No increased vocal fremitus resonant to percussion  Abdomen: Soft, nontender, nondistended, positive bowel sounds, no masses no hepatosplenomegaly noted..  Neuro: No focal neurological deficits noted cranial nerves II through XII grossly intact. DTRs 2+ bilaterally upper and lower extremities. Strength functional in bilateral upper and lower extremities.  Musculoskeletal: No warm swelling or erythema around joints, no spinal tenderness noted.   Total time spent in discharge process approximately 40 minutes.  Signed:  Aspen Deterding A.  01/19/2012, 11:09 AM   

## 2012-01-30 ENCOUNTER — Encounter (HOSPITAL_COMMUNITY): Payer: Self-pay | Admitting: Family Medicine

## 2012-01-30 ENCOUNTER — Emergency Department (HOSPITAL_COMMUNITY): Payer: Medicare (Managed Care)

## 2012-01-30 ENCOUNTER — Inpatient Hospital Stay (HOSPITAL_COMMUNITY)
Admission: EM | Admit: 2012-01-30 | Discharge: 2012-02-03 | DRG: 690 | Disposition: A | Discharge status: Home / Self Care | Payer: Medicare (Managed Care) | Attending: Family Medicine | Admitting: Family Medicine

## 2012-01-30 DIAGNOSIS — Z720 Tobacco use: Secondary | ICD-10-CM | POA: Diagnosis present

## 2012-01-30 DIAGNOSIS — N390 Urinary tract infection, site not specified: Secondary | ICD-10-CM | POA: Diagnosis present

## 2012-01-30 DIAGNOSIS — R972 Elevated prostate specific antigen [PSA]: Secondary | ICD-10-CM | POA: Diagnosis present

## 2012-01-30 DIAGNOSIS — E785 Hyperlipidemia, unspecified: Secondary | ICD-10-CM | POA: Diagnosis present

## 2012-01-30 DIAGNOSIS — F17200 Nicotine dependence, unspecified, uncomplicated: Secondary | ICD-10-CM | POA: Diagnosis present

## 2012-01-30 DIAGNOSIS — R197 Diarrhea, unspecified: Secondary | ICD-10-CM | POA: Diagnosis present

## 2012-01-30 DIAGNOSIS — K5900 Constipation, unspecified: Secondary | ICD-10-CM | POA: Diagnosis present

## 2012-01-30 DIAGNOSIS — R112 Nausea with vomiting, unspecified: Secondary | ICD-10-CM | POA: Diagnosis present

## 2012-01-30 DIAGNOSIS — N3000 Acute cystitis without hematuria: Secondary | ICD-10-CM

## 2012-01-30 DIAGNOSIS — J449 Chronic obstructive pulmonary disease, unspecified: Secondary | ICD-10-CM | POA: Diagnosis present

## 2012-01-30 DIAGNOSIS — I10 Essential (primary) hypertension: Secondary | ICD-10-CM | POA: Diagnosis present

## 2012-01-30 DIAGNOSIS — E860 Dehydration: Secondary | ICD-10-CM | POA: Diagnosis present

## 2012-01-30 DIAGNOSIS — N138 Other obstructive and reflux uropathy: Secondary | ICD-10-CM | POA: Diagnosis present

## 2012-01-30 DIAGNOSIS — Z79899 Other long term (current) drug therapy: Secondary | ICD-10-CM

## 2012-01-30 DIAGNOSIS — F341 Dysthymic disorder: Secondary | ICD-10-CM | POA: Diagnosis present

## 2012-01-30 DIAGNOSIS — R109 Unspecified abdominal pain: Secondary | ICD-10-CM | POA: Diagnosis present

## 2012-01-30 DIAGNOSIS — R531 Weakness: Secondary | ICD-10-CM | POA: Diagnosis present

## 2012-01-30 DIAGNOSIS — D72829 Elevated white blood cell count, unspecified: Secondary | ICD-10-CM

## 2012-01-30 DIAGNOSIS — K219 Gastro-esophageal reflux disease without esophagitis: Secondary | ICD-10-CM | POA: Diagnosis present

## 2012-01-30 DIAGNOSIS — W19XXXA Unspecified fall, initial encounter: Secondary | ICD-10-CM

## 2012-01-30 DIAGNOSIS — M6281 Muscle weakness (generalized): Secondary | ICD-10-CM | POA: Diagnosis present

## 2012-01-30 DIAGNOSIS — N200 Calculus of kidney: Secondary | ICD-10-CM

## 2012-01-30 DIAGNOSIS — J4489 Other specified chronic obstructive pulmonary disease: Secondary | ICD-10-CM | POA: Diagnosis present

## 2012-01-30 DIAGNOSIS — K279 Peptic ulcer, site unspecified, unspecified as acute or chronic, without hemorrhage or perforation: Secondary | ICD-10-CM | POA: Diagnosis present

## 2012-01-30 DIAGNOSIS — Z87442 Personal history of urinary calculi: Secondary | ICD-10-CM | POA: Diagnosis present

## 2012-01-30 DIAGNOSIS — Z8249 Family history of ischemic heart disease and other diseases of the circulatory system: Secondary | ICD-10-CM

## 2012-01-30 DIAGNOSIS — R338 Other retention of urine: Secondary | ICD-10-CM | POA: Diagnosis present

## 2012-01-30 DIAGNOSIS — N401 Enlarged prostate with lower urinary tract symptoms: Secondary | ICD-10-CM | POA: Diagnosis present

## 2012-01-30 DIAGNOSIS — F1410 Cocaine abuse, uncomplicated: Secondary | ICD-10-CM | POA: Diagnosis present

## 2012-01-30 DIAGNOSIS — R5381 Other malaise: Secondary | ICD-10-CM | POA: Diagnosis present

## 2012-01-30 DIAGNOSIS — K8020 Calculus of gallbladder without cholecystitis without obstruction: Secondary | ICD-10-CM | POA: Diagnosis present

## 2012-01-30 DIAGNOSIS — E119 Type 2 diabetes mellitus without complications: Secondary | ICD-10-CM | POA: Diagnosis present

## 2012-01-30 DIAGNOSIS — Z9181 History of falling: Secondary | ICD-10-CM

## 2012-01-30 DIAGNOSIS — E782 Mixed hyperlipidemia: Secondary | ICD-10-CM

## 2012-01-30 HISTORY — DX: Calculus of kidney: N20.0

## 2012-01-30 HISTORY — DX: Shortness of breath: R06.02

## 2012-01-30 LAB — CBC
HCT: 46.1 % (ref 39.0–52.0)
HCT: 44.5 % (ref 39.0–52.0)
Hemoglobin: 16 g/dL (ref 13.0–17.0)
Hemoglobin: 14.8 g/dL (ref 13.0–17.0)
MCH: 31.3 pg (ref 26.0–34.0)
MCH: 30.1 pg (ref 26.0–34.0)
MCHC: 34.7 g/dL (ref 30.0–36.0)
MCV: 90 fL (ref 78.0–100.0)
MCV: 90.4 fL (ref 78.0–100.0)
Platelets: 230 10*3/uL (ref 150–400)
RBC: 5.12 MIL/uL (ref 4.22–5.81)
RBC: 4.92 MIL/uL (ref 4.22–5.81)
RDW: 14.5 % (ref 11.5–15.5)
WBC: 13.3 10*3/uL — ABNORMAL HIGH (ref 4.0–10.5)

## 2012-01-30 LAB — URINALYSIS, ROUTINE W REFLEX MICROSCOPIC
Glucose, UA: NEGATIVE mg/dL
Ketones, ur: NEGATIVE mg/dL
Nitrite: POSITIVE — AB
Protein, ur: 30 mg/dL — AB
Specific Gravity, Urine: 1.015 (ref 1.005–1.030)
Urobilinogen, UA: 1 mg/dL (ref 0.0–1.0)
pH: 6.5 (ref 5.0–8.0)

## 2012-01-30 LAB — BASIC METABOLIC PANEL
BUN: 19 mg/dL (ref 6–23)
CO2: 28 mEq/L (ref 19–32)
Calcium: 9.5 mg/dL (ref 8.4–10.5)
Chloride: 98 mEq/L (ref 96–112)
Creatinine, Ser: 0.98 mg/dL (ref 0.50–1.35)
GFR calc Af Amer: >90 mL/min (ref >90)
GFR calc non Af Amer: 78 mL/min — ABNORMAL LOW (ref >90)
Glucose, Bld: 85 mg/dL (ref 70–99)
Potassium: 4 mEq/L (ref 3.5–5.1)
Sodium: 138 mEq/L (ref 135–145)

## 2012-01-30 LAB — URINE MICROSCOPIC-ADD ON

## 2012-01-30 LAB — CARDIAC PANEL(CRET KIN+CKTOT+MB+TROPI)
CK, MB: 1.3 ng/mL (ref 0.3–4.0)
Total CK: 27 U/L (ref 7–232)

## 2012-01-30 LAB — HEMOGLOBIN A1C: Mean Plasma Glucose: 128 mg/dL — ABNORMAL HIGH (ref <117)

## 2012-01-30 LAB — LACTIC ACID, PLASMA: Lactic Acid, Venous: 1 mmol/L (ref 0.5–2.2)

## 2012-01-30 LAB — RAPID URINE DRUG SCREEN, HOSP PERFORMED
Benzodiazepines: NOT DETECTED
Cocaine: NOT DETECTED

## 2012-01-30 LAB — CREATININE, SERUM: Creatinine, Ser: 0.91 mg/dL (ref 0.50–1.35)

## 2012-01-30 MED ORDER — BISACODYL 5 MG PO TBEC
5.0000 mg | DELAYED_RELEASE_TABLET | Freq: Every day | ORAL | Status: DC | PRN
  Administered 2012-02-02: 5 mg via ORAL
  Filled 2012-01-30: qty 1

## 2012-01-30 MED ORDER — DOCUSATE SODIUM 100 MG PO CAPS
100.0000 mg | ORAL_CAPSULE | Freq: Two times a day (BID) | ORAL | Status: DC
  Administered 2012-01-30 – 2012-02-03 (×8): 100 mg via ORAL
  Filled 2012-01-30 (×7): qty 1

## 2012-01-30 MED ORDER — HEPARIN SODIUM (PORCINE) 5000 UNIT/ML IJ SOLN
5000.0000 [IU] | Freq: Three times a day (TID) | INTRAMUSCULAR | Status: DC
  Administered 2012-01-30 – 2012-02-03 (×12): 5000 [IU] via SUBCUTANEOUS
  Filled 2012-01-30 (×14): qty 1

## 2012-01-30 MED ORDER — ONDANSETRON HCL 4 MG PO TABS: 4.0000 mg | ORAL_TABLET | Freq: Four times a day (QID) | ORAL | Status: DC | PRN

## 2012-01-30 MED ORDER — SIMVASTATIN 40 MG PO TABS: 40.0000 mg | ORAL_TABLET | Freq: Every day | ORAL | Status: DC

## 2012-01-30 MED ORDER — AMLODIPINE BESYLATE 5 MG PO TABS
5.0000 mg | ORAL_TABLET | Freq: Every day | ORAL | Status: DC
  Administered 2012-01-30 – 2012-02-03 (×5): 5 mg via ORAL
  Filled 2012-01-30 (×5): qty 1

## 2012-01-30 MED ORDER — HYDROCODONE-ACETAMINOPHEN 5-325 MG PO TABS
1.0000 | ORAL_TABLET | ORAL | Status: DC | PRN
  Administered 2012-01-30 – 2012-02-02 (×9): 2 via ORAL
  Filled 2012-01-30 (×9): qty 2

## 2012-01-30 MED ORDER — SODIUM CHLORIDE 0.9 % IV SOLN
INTRAVENOUS | Status: DC
  Administered 2012-01-30 – 2012-02-01 (×3): via INTRAVENOUS

## 2012-01-30 MED ORDER — PHENAZOPYRIDINE HCL 200 MG PO TABS
200.0000 mg | ORAL_TABLET | Freq: Three times a day (TID) | ORAL | Status: AC
  Administered 2012-01-30 – 2012-02-01 (×5): 200 mg via ORAL
  Filled 2012-01-30 (×6): qty 1

## 2012-01-30 MED ORDER — SODIUM CHLORIDE 0.9 % IV BOLUS (SEPSIS)
1000.0000 mL | Freq: Once | INTRAVENOUS | Status: AC
  Administered 2012-01-30: 1000 mL via INTRAVENOUS

## 2012-01-30 MED ORDER — CIPROFLOXACIN IN D5W 400 MG/200ML IV SOLN
400.0000 mg | Freq: Two times a day (BID) | INTRAVENOUS | Status: DC
  Administered 2012-01-30 – 2012-01-31 (×3): 400 mg via INTRAVENOUS
  Filled 2012-01-30 (×5): qty 200

## 2012-01-30 MED ORDER — ATORVASTATIN CALCIUM 20 MG PO TABS
20.0000 mg | ORAL_TABLET | Freq: Every day | ORAL | Status: DC
  Administered 2012-01-30 – 2012-02-02 (×4): 20 mg via ORAL
  Filled 2012-01-30 (×5): qty 1

## 2012-01-30 MED ORDER — INSULIN ASPART 100 UNIT/ML ~~LOC~~ SOLN
0.0000 [IU] | Freq: Three times a day (TID) | SUBCUTANEOUS | Status: DC
  Administered 2012-01-31: 2 [IU] via SUBCUTANEOUS

## 2012-01-30 MED ORDER — ONDANSETRON HCL 4 MG/2ML IJ SOLN: 4.0000 mg | Freq: Four times a day (QID) | INTRAMUSCULAR | Status: DC | PRN

## 2012-01-30 MED ORDER — METFORMIN HCL 500 MG PO TABS
500.0000 mg | ORAL_TABLET | Freq: Two times a day (BID) | ORAL | Status: DC
  Administered 2012-01-30 – 2012-02-01 (×4): 500 mg via ORAL
  Filled 2012-01-30 (×6): qty 1

## 2012-01-30 MED ORDER — ONDANSETRON HCL 4 MG/2ML IJ SOLN
4.0000 mg | Freq: Once | INTRAMUSCULAR | Status: AC
  Administered 2012-01-30: 4 mg via INTRAVENOUS
  Filled 2012-01-30: qty 2

## 2012-01-30 MED ORDER — FLUTICASONE-SALMETEROL 100-50 MCG/DOSE IN AEPB
1.0000 | INHALATION_SPRAY | Freq: Two times a day (BID) | RESPIRATORY_TRACT | Status: DC
  Administered 2012-01-30 – 2012-02-03 (×7): 1 via RESPIRATORY_TRACT
  Filled 2012-01-30: qty 14

## 2012-01-30 MED ORDER — PANTOPRAZOLE SODIUM 40 MG PO TBEC
40.0000 mg | DELAYED_RELEASE_TABLET | Freq: Every day | ORAL | Status: DC
  Administered 2012-01-30 – 2012-02-03 (×4): 40 mg via ORAL
  Filled 2012-01-30 (×5): qty 1

## 2012-01-30 MED ORDER — GI COCKTAIL ~~LOC~~
30.0000 mL | Freq: Once | ORAL | Status: AC
  Administered 2012-01-30: 30 mL via ORAL
  Filled 2012-01-30: qty 30

## 2012-01-30 MED ORDER — SODIUM CHLORIDE 0.9 % IV SOLN: INTRAVENOUS | Status: DC

## 2012-01-30 MED ORDER — MORPHINE SULFATE 2 MG/ML IJ SOLN
2.0000 mg | Freq: Once | INTRAMUSCULAR | Status: AC
  Administered 2012-01-30: 2 mg via INTRAVENOUS
  Filled 2012-01-30: qty 1

## 2012-01-30 MED ORDER — ACETAMINOPHEN 325 MG PO TABS: 650.0000 mg | ORAL_TABLET | Freq: Four times a day (QID) | ORAL | Status: DC | PRN

## 2012-01-30 MED ORDER — ONDANSETRON HCL 4 MG/2ML IJ SOLN: 4.0000 mg | Freq: Three times a day (TID) | INTRAMUSCULAR | Status: DC | PRN

## 2012-01-30 MED ORDER — CIPROFLOXACIN IN D5W 400 MG/200ML IV SOLN
400.0000 mg | Freq: Once | INTRAVENOUS | Status: AC
  Administered 2012-01-30: 400 mg via INTRAVENOUS
  Filled 2012-01-30: qty 200

## 2012-01-30 MED ORDER — ACETAMINOPHEN 650 MG RE SUPP: 650.0000 mg | Freq: Four times a day (QID) | RECTAL | Status: DC | PRN

## 2012-01-30 MED ORDER — MORPHINE SULFATE 4 MG/ML IJ SOLN
4.0000 mg | Freq: Once | INTRAMUSCULAR | Status: AC
  Administered 2012-01-30: 4 mg via INTRAVENOUS
  Filled 2012-01-30: qty 1

## 2012-01-30 MED ORDER — MORPHINE SULFATE 2 MG/ML IJ SOLN
2.0000 mg | INTRAMUSCULAR | Status: DC | PRN
  Administered 2012-01-30 – 2012-02-01 (×8): 2 mg via INTRAVENOUS
  Filled 2012-01-30 (×8): qty 1

## 2012-01-30 NOTE — ED Notes (Signed)
Patient states general weakness for few weeks seen in another hospital last week. States does not feel any better.  General abdominal pain, dysuria, and nausea.  Patient states feels like to vomit cannot. Here by himself states took taxi to ED.  Airway intact bilateral equal chest rise and fall.

## 2012-01-30 NOTE — ED Notes (Signed)
Pt son [name] [phone]

## 2012-01-30 NOTE — ED Notes (Signed)
Recently admitted and feels no better, pt reports stomach pain, vomiting and weakness with standing'

## 2012-01-30 NOTE — Progress Notes (Signed)
ANTIBIOTIC CONSULT NOTE - INITIAL  Pharmacy Consult for Cipro Indication: UTI;  H/o recent hospitalization for UTI, discharged on cipro.  No Known Allergies  Patient Measurements: Height: 5\' 10"  (177.8 cm) Weight: 162 lb 0.6 oz (73.5 kg) IBW/kg (Calculated) : 73    Vital Signs: Temp: 97.8 F (36.6 C) (05/13 1512) Temp src: Oral (05/13 1512) BP: 125/75 mmHg (05/13 1512) Pulse Rate: 87  (05/13 1512) Intake/Output from previous day:   Intake/Output from this shift:    Labs:  Basename 01/30/12 1132  WBC 13.3*  HGB 16.0  PLT 230  LABCREA --  CREATININE 0.98   Estimated Creatinine Clearance: 67.2 ml/min (by C-G formula based on Cr of 0.98). No results found for this basename: VANCOTROUGH:2,VANCOPEAK:2,VANCORANDOM:2,GENTTROUGH:2,GENTPEAK:2,GENTRANDOM:2,TOBRATROUGH:2,TOBRAPEAK:2,TOBRARND:2,AMIKACINPEAK:2,AMIKACINTROU:2,AMIKACIN:2, in the last 72 hours   Microbiology: Recent Results (from the past 720 hour(s))  URINE CULTURE     Status: Normal   Collection Time   01/06/12  7:45 PM      Component Value Range Status Comment   Specimen Description URINE, RANDOM   Final    Special Requests NONE   Final    Culture  Setup Time [id number]   Final    Colony Count NO GROWTH   Final    Culture NO GROWTH   Final    Report Status 01/07/2012 FINAL   Final   URINE CULTURE     Status: Normal   Collection Time   01/15/12  1:18 PM      Component Value Range Status Comment   Specimen Description URINE, CLEAN CATCH   Final    Special Requests NONE   Final    Culture  Setup Time [id number]   Final    Colony Count >=100,000 COLONIES/ML   Final    Culture ESCHERICHIA COLI   Final    Report Status 01/17/2012 FINAL   Final    Organism ID, Bacteria ESCHERICHIA COLI   Final   MRSA PCR SCREENING     Status: Normal   Collection Time   01/15/12  3:50 PM      Component Value Range Status Comment   MRSA by PCR NEGATIVE  NEGATIVE  Final     Medical History: Past Medical History  Diagnosis  Date  . DIABETES MELLITUS, TYPE II 11/03/2009  . HYPERLIPIDEMIA 11/03/2009  . ANXIETY 11/03/2009  . DEPRESSION 11/03/2009  . RESTLESS LEG SYNDROME 11/03/2009  . HYPERTENSION 11/03/2009  . CHRONIC OBSTRUCTIVE PULMONARY DISEASE, ACUTE EXACERBATION 11/03/2009  . EMPHYSEMA, BULLOUS 11/03/2009  . ASTHMA 11/03/2009  . COPD 11/03/2009  . GERD 11/03/2009  . PEPTIC ULCER DISEASE 11/03/2009  . ABSCESS, FINGER 04/07/2010  . ABSCESS 12/03/2009  . DISC DISEASE, LUMBAR 11/03/2009  . SPINAL STENOSIS, LUMBAR 11/03/2009  . RASH-NONVESICULAR 11/03/2009  . NEPHROLITHIASIS, HX OF 11/03/2009    Medications:  Prescriptions prior to admission  Medication Sig Dispense Refill  . amLODipine (NORVASC) 5 MG tablet Take 5 mg by mouth daily.      . Fluticasone-Salmeterol (ADVAIR DISKUS) 100-50 MCG/DOSE AEPB Inhale 1 puff into the lungs every 12 (twelve) hours.  60 each  0  . glucose blood (PRODIGY TEST) test strip Use as instructed  100 each  3  . metFORMIN (GLUCOPHAGE) 500 MG tablet Take 500 mg by mouth 2 (two) times daily with a meal.      . naproxen sodium (ANAPROX) 220 MG tablet Take 440 mg by mouth 2 (two) times daily with a meal. For headaches      . simvastatin (ZOCOR) 40 MG  tablet Take 1 tablet (40 mg total) by mouth daily.  30 tablet  0   Scheduled:    . amLODipine  5 mg Oral Daily  . atorvastatin  20 mg Oral q1800  . ciprofloxacin  400 mg Intravenous Once  . docusate sodium  100 mg Oral BID  . Fluticasone-Salmeterol  1 puff Inhalation Q12H  . gi cocktail  30 mL Oral Once  . heparin  5,000 Units Subcutaneous Q8H  . insulin aspart  0-15 Units Subcutaneous TID WC  . metFORMIN  500 mg Oral BID WC  .  morphine injection  2 mg Intravenous Once  .  morphine injection  4 mg Intravenous Once  . ondansetron (ZOFRAN) IV  4 mg Intravenous Once  . pantoprazole  40 mg Oral Q0600  . phenazopyridine  200 mg Oral TID WC  . sodium chloride  1,000 mL Intravenous Once  . DISCONTD: sodium chloride   Intravenous STAT  .  DISCONTD: simvastatin  40 mg Oral Daily   Assessment: 78 y.o. male to the ER today complaining of urinary pain. Recently discharged from [facility] with UTI and discharged on Ciprofloxacin. Previous urine cultured grew  pansensitive E. Coli UTI per the urine culture results.  Scr 0.98 and estimated CrCl ~ 67 ml/min.  Cipro 400mg  IV given in ED today @ 13:10  Plan:  Cipro 400mg  IV q12h  Noah Delaine, RPh Clinical Pharmacist 01/30/2012,5:25 PM

## 2012-01-30 NOTE — H&P (Signed)
History and Physical Examination  Date: 01/30/2012  Patient name: [name] [name] Medical record number: [id number] Date of birth: [birth year] Age: 78 y.o. Gender: male PCP: [name], MD, MD  Chief Complaint:  Chief Complaint  Patient presents with  . Weakness     History of Present Illness: [name] [name] is an 78 y.o. male with COPD, DM, HTN and GERD who presented to the ER today complaining of urinary pain.  Pt had recently been discharged from [facility] with UTI and sent home on ciprofloxacin.  This was a pansensitive E. Coli UTI per the urine culture results.   Pt says he took the ciprofloxacin and when he completed it he had worsening of symptoms of abdominal pain and urinary burning and urinary pain.   Pt says that this does not feel like when he has passed kidney stones in the past. He has urolithiasis but repeat CT abdomen in ER today says that the stone has passed and no evidence was seen of residual stone in the urinary system.  Pt denies blood in urine.  He denies fever but has had chills.  Pt has had an abnormal Urinalysis in the ER today and was given IV cipro.  Pt was noted to have been found to have been using cocaine in the past.  Pt reports significant sharp episodic and intermittent sharp pain in the RLQ.  Hospital admission was requested for further evaluation and management.  Pt also reports that he has fallen down a couple of times since he was discharged home.    Past Medical History Past Medical History  Diagnosis Date  . DIABETES MELLITUS, TYPE II 11/03/2009  . HYPERLIPIDEMIA 11/03/2009  . ANXIETY 11/03/2009  . DEPRESSION 11/03/2009  . RESTLESS LEG SYNDROME 11/03/2009  . HYPERTENSION 11/03/2009  . CHRONIC OBSTRUCTIVE PULMONARY DISEASE, ACUTE EXACERBATION 11/03/2009  . EMPHYSEMA, BULLOUS 11/03/2009  . ASTHMA 11/03/2009  . COPD 11/03/2009  . GERD 11/03/2009  . PEPTIC ULCER DISEASE 11/03/2009  . ABSCESS, FINGER 04/07/2010  . ABSCESS 12/03/2009  . DISC DISEASE, LUMBAR  11/03/2009  . SPINAL STENOSIS, LUMBAR 11/03/2009  . RASH-NONVESICULAR 11/03/2009  . NEPHROLITHIASIS, HX OF 11/03/2009    Past Surgical History Past Surgical History  Procedure Date  . Rotator cuff repair 2002  . S/p left foot surgury      Morton's Neuroma  . Tonsillectomy     Home Meds: Prior to Admission medications   Medication Sig Start Date End Date Taking? Authorizing Provider  amLODipine (NORVASC) 5 MG tablet Take 5 mg by mouth daily.   Yes Historical Provider, MD  Fluticasone-Salmeterol (ADVAIR DISKUS) 100-50 MCG/DOSE AEPB Inhale 1 puff into the lungs every 12 (twelve) hours. 08/13/11  Yes Erick Blinks, MD  glucose blood (PRODIGY TEST) test strip Use as instructed 04/26/11  Yes [name], MD  metFORMIN (GLUCOPHAGE) 500 MG tablet Take 500 mg by mouth 2 (two) times daily with a meal.   Yes Historical Provider, MD  naproxen sodium (ANAPROX) 220 MG tablet Take 440 mg by mouth 2 (two) times daily with a meal. For headaches   Yes Historical Provider, MD  simvastatin (ZOCOR) 40 MG tablet Take 1 tablet (40 mg total) by mouth daily. 08/13/11  Yes Erick Blinks, MD    Allergies: Review of patient's allergies indicates no known allergies.  Social History:  History   Social History  . Marital Status: Widowed    Spouse Name: N/A    Number of Children: 2  . Years of  Education: N/A   Occupational History  .     Social History Main Topics  . Smoking status: Former Games developer  . Smokeless tobacco: Not on file   Comment: since 1993  . Alcohol Use: No  . Drug Use: No  . Sexually Active: Not on file   Other Topics Concern  . Not on file   Social History Narrative  . No narrative on file   Family History:  Family History  Problem Relation Age of Onset  . Heart disease Mother   . Cancer Brother     lung    Review of Systems: Pertinent items are noted in HPI. All other systems reviewed and reported as negative.   Physical Exam: Blood pressure 125/75, pulse 87,  temperature 97.8 F (36.6 C), temperature source Oral, resp. rate 18, height 5\' 10"  (1.778 m), weight 73.5 kg (162 lb 0.6 oz), SpO2 97.00%. General appearance: alert, cooperative, appears stated age and no distress Head: Normocephalic, without obvious abnormality, atraumatic Eyes: conjunctivae/corneas clear. PERRL, EOM's intact. Fundi benign. Nose: Nares normal. Septum midline. Mucosa normal. No drainage or sinus tenderness., no discharge Throat: lips, mucosa, and tongue normal; teeth and gums normal Neck: no adenopathy, no carotid bruit, no JVD, supple, symmetrical, trachea midline and thyroid not enlarged, symmetric, no tenderness/mass/nodules Lungs: clear to auscultation bilaterally Chest wall: no tenderness Heart: S1, S2 normal Abdomen: soft, nondistended, no masses palpated, normal BS, mild suprapubic TTP and RLQ tenderness with deep palpation, no guarding, Negative Murphy Sign Extremities: extremities normal, atraumatic, no cyanosis or edema Pulses: 2+ and symmetric Skin: Skin color, texture, turgor normal. No rashes or lesions Neurologic: Alert and oriented X 3, normal strength and tone. Normal symmetric reflexes.  Lab  And Imaging results:  Results for orders placed during the hospital encounter of 01/30/12 (from the past 24 hour(s))  LACTIC ACID, PLASMA     Status: Normal   Collection Time   01/30/12 11:14 AM      Component Value Range   Lactic Acid, Venous 1.0  0.5 - 2.2 (mmol/L)  CBC     Status: Abnormal   Collection Time   01/30/12 11:32 AM      Component Value Range   WBC 13.3 (*) 4.0 - 10.5 (K/uL)   RBC 5.12  4.22 - 5.81 (MIL/uL)   Hemoglobin 16.0  13.0 - 17.0 (g/dL)   HCT 16.1  09.6 - 04.5 (%)   MCV 90.0  78.0 - 100.0 (fL)   MCH 31.3  26.0 - 34.0 (pg)   MCHC 34.7  30.0 - 36.0 (g/dL)   RDW 40.9  81.1 - 91.4 (%)   Platelets 230  150 - 400 (K/uL)  BASIC METABOLIC PANEL     Status: Abnormal   Collection Time   01/30/12 11:32 AM      Component Value Range   Sodium 138   135 - 145 (mEq/L)   Potassium 4.0  3.5 - 5.1 (mEq/L)   Chloride 98  96 - 112 (mEq/L)   CO2 28  19 - 32 (mEq/L)   Glucose, Bld 85  70 - 99 (mg/dL)   BUN 19  6 - 23 (mg/dL)   Creatinine, Ser 7.82  0.50 - 1.35 (mg/dL)   Calcium 9.5  8.4 - 95.6 (mg/dL)   GFR calc non Af Amer 78 (*) >90 (mL/min)   GFR calc Af Amer >90  >90 (mL/min)  URINALYSIS, ROUTINE W REFLEX MICROSCOPIC     Status: Abnormal   Collection Time   01/30/12  11:59 AM      Component Value Range   Color, Urine ORANGE (*) YELLOW    APPearance CLOUDY (*) CLEAR    Specific Gravity, Urine 1.015  1.005 - 1.030    pH 6.5  5.0 - 8.0    Glucose, UA NEGATIVE  NEGATIVE (mg/dL)   Hgb urine dipstick MODERATE (*) NEGATIVE    Bilirubin Urine SMALL (*) NEGATIVE    Ketones, ur NEGATIVE  NEGATIVE (mg/dL)   Protein, ur 30 (*) NEGATIVE (mg/dL)   Urobilinogen, UA 1.0  0.0 - 1.0 (mg/dL)   Nitrite POSITIVE (*) NEGATIVE    Leukocytes, UA LARGE (*) NEGATIVE   URINE MICROSCOPIC-ADD ON     Status: Abnormal   Collection Time   01/30/12 11:59 AM      Component Value Range   Squamous Epithelial / LPF RARE  RARE    WBC, UA TOO NUMEROUS TO COUNT  <3 (WBC/hpf)   RBC / HPF 7-10  <3 (RBC/hpf)   Bacteria, UA MANY (*) RARE    Impression   UTI (urinary tract infection)  DIABETES MELLITUS, TYPE II  HYPERTENSION  COPD  GERD  PEPTIC ULCER DISEASE  NEPHROLITHIASIS, HX OF  Dehydration  Abdominal  pain, other specified site  Cocaine abuse  Tobacco abuse  Nausea vomiting and diarrhea  Generalized weakness  Fall at home  Physical deconditioning  Cholelithiasis  Plan  Admit to the hospital for ongoing care, IV ciprofloxacin has been started and will continue.  Urine culture has been ordered and pending, urine tox screen is been ordered, serial abdominal exams will be ordered, will follow white blood cell count, we'll follow temperature, provide gentle IV hydration, as physical therapy and occupational therapy to evaluate and treat and leave  recommendations, right IV pain medication as needed, cycle cardiac enzymes, check urine for GC chlamydia, check RPR, resume home medications for blood pressure, monitor BP, Protonix for GI prophylaxis, DVT prophylaxis, please see orders and follow hospital course.  Consider urology consultation if no significant improvement with these initial therapies.  Standley Dakins MD [facility] [place], [place] [phone] 01/30/2012, 4:37 PM

## 2012-01-30 NOTE — ED Provider Notes (Signed)
History    78 year old male with lower abdominal pain, vomiting and generalized weakness. Patient with recent admission for UTI. Was discharged to ciprofloxacin. Reports complaining about being off early several days. Has progressively come more fatigued and weak. He feels very off balance when he walks because he is so weak. Complaining of dysuria and lower abdominal pain. Subjective fevers last night. No chills. Vomitus is nonbilious nonbloody. No diarrhea.  CSN: [id number]  Arrival date & time 01/30/12  5621   First MD Initiated Contact with Patient 01/30/12 1056      Chief Complaint  Patient presents with  . Weakness    (Consider location/radiation/quality/duration/timing/severity/associated sxs/prior treatment) HPI  Past Medical History  Diagnosis Date  . DIABETES MELLITUS, TYPE II 11/03/2009  . HYPERLIPIDEMIA 11/03/2009  . ANXIETY 11/03/2009  . DEPRESSION 11/03/2009  . RESTLESS LEG SYNDROME 11/03/2009  . HYPERTENSION 11/03/2009  . CHRONIC OBSTRUCTIVE PULMONARY DISEASE, ACUTE EXACERBATION 11/03/2009  . EMPHYSEMA, BULLOUS 11/03/2009  . ASTHMA 11/03/2009  . COPD 11/03/2009  . GERD 11/03/2009  . PEPTIC ULCER DISEASE 11/03/2009  . ABSCESS, FINGER 04/07/2010  . ABSCESS 12/03/2009  . DISC DISEASE, LUMBAR 11/03/2009  . SPINAL STENOSIS, LUMBAR 11/03/2009  . RASH-NONVESICULAR 11/03/2009  . NEPHROLITHIASIS, HX OF 11/03/2009    Past Surgical History  Procedure Date  . Rotator cuff repair 2002  . S/p left foot surgury      Morton's Neuroma  . Tonsillectomy     Family History  Problem Relation Age of Onset  . Heart disease Mother   . Cancer Brother     lung    History  Substance Use Topics  . Smoking status: Former Games developer  . Smokeless tobacco: Not on file   Comment: since 1993  . Alcohol Use: No      Review of Systems   Review of symptoms negative unless otherwise noted in HPI.   Allergies  Review of patient's allergies indicates no known allergies.  Home Medications    Current Outpatient Rx  Name Route Sig Dispense Refill  . AMLODIPINE BESYLATE 5 MG PO TABS Oral Take 5 mg by mouth daily.    Marland Kitchen FLUTICASONE-SALMETEROL 100-50 MCG/DOSE IN AEPB Inhalation Inhale 1 puff into the lungs every 12 (twelve) hours. 60 each 0  . GLUCOSE BLOOD VI STRP  Use as instructed 100 each 3  . METFORMIN HCL 500 MG PO TABS Oral Take 500 mg by mouth 2 (two) times daily with a meal.    . NAPROXEN SODIUM 220 MG PO TABS Oral Take 440 mg by mouth 2 (two) times daily with a meal. For headaches    . SIMVASTATIN 40 MG PO TABS Oral Take 1 tablet (40 mg total) by mouth daily. 30 tablet 0    BP 114/63  Pulse 97  Temp(Src) 97.5 F (36.4 C) (Oral)  Resp 18  SpO2 97%  Physical Exam  Nursing note and vitals reviewed. Constitutional: He appears well-developed and well-nourished.       Laying in bed. Uncomfortable appearing.  HENT:  Head: Normocephalic and atraumatic.  Eyes: Conjunctivae are normal. Right eye exhibits no discharge. Left eye exhibits no discharge.  Neck: Neck supple.  Cardiovascular: Regular rhythm and normal heart sounds.  Exam reveals no gallop and no friction rub.   No murmur heard.      Mildly tachycardic with regular rhythm. No murmur.  Pulmonary/Chest: Effort normal and breath sounds normal. No respiratory distress.  Abdominal: Soft. He exhibits no distension. There is tenderness.  Moderate suprapubic tenderness. No guarding or rebound. No masses palpated.  Genitourinary:       No costovertebral angle tenderness  Musculoskeletal: He exhibits no edema and no tenderness.  Neurological: He is alert.  Skin: Skin is warm and dry.  Psychiatric: He has a normal mood and affect. His behavior is normal. Thought content normal.    ED Course  Procedures (including critical care time)  Labs Reviewed  CBC - Abnormal; Notable for the following:    WBC 13.3 (*)    All other components within normal limits  BASIC METABOLIC PANEL - Abnormal; Notable for the  following:    GFR calc non Af Amer 78 (*)    All other components within normal limits  URINALYSIS, ROUTINE W REFLEX MICROSCOPIC - Abnormal; Notable for the following:    Color, Urine ORANGE (*) BIOCHEMICALS MAY BE AFFECTED BY COLOR   APPearance CLOUDY (*)    Hgb urine dipstick MODERATE (*)    Bilirubin Urine SMALL (*)    Protein, ur 30 (*)    Nitrite POSITIVE (*)    Leukocytes, UA LARGE (*)    All other components within normal limits  URINE MICROSCOPIC-ADD ON - Abnormal; Notable for the following:    Bacteria, UA MANY (*)    All other components within normal limits  URINE RAPID DRUG SCREEN (HOSP PERFORMED) - Abnormal; Notable for the following:    Opiates POSITIVE (*)    All other components within normal limits  CBC - Abnormal; Notable for the following:    WBC 10.9 (*)    All other components within normal limits  CREATININE, SERUM - Abnormal; Notable for the following:    GFR calc non Af Amer 81 (*)    All other components within normal limits  LACTIC ACID, PLASMA  CARDIAC PANEL(CRET KIN+CKTOT+MB+TROPI)  CULTURE, BLOOD (ROUTINE X 2)  CULTURE, BLOOD (ROUTINE X 2)  URINE CULTURE  CARDIAC PANEL(CRET KIN+CKTOT+MB+TROPI)  URINE CULTURE  SEDIMENTATION RATE  C-REACTIVE PROTEIN  HEMOGLOBIN A1C  CBC  COMPREHENSIVE METABOLIC PANEL  CARDIAC PANEL(CRET KIN+CKTOT+MB+TROPI)   Ct Abdomen Pelvis Wo Contrast  01/30/2012  *RADIOLOGY REPORT*  Clinical Data: Persistent right flank pain with chills.  Recent ureteral calculus.  CT ABDOMEN AND PELVIS WITHOUT CONTRAST  Technique:  Multidetector CT imaging of the abdomen and pelvis was performed following the standard protocol without intravenous contrast.  Comparison: CT 01/15/2012.  Findings: Recently demonstrated right-sided hydronephrosis and hydroureter have improved.  There is improved perinephric soft tissue stranding.  The previously demonstrated distal right ureteral calculus is no longer visualized.  There is no evidence of bladder  calculus.  No new calcifications are seen within the prostate gland to suggest a calculus within the prostatic urethra. There is a stable nonobstructing calculus in the upper pole of the right kidney. There is a tiny calculus in the mid-left kidney.  Cholelithiasis and aortoiliac atherosclerosis appears stable.  The liver, spleen, gallbladder, pancreas and adrenal glands otherwise appear unremarkable.  There are stable right renal cyst.  No inflammatory changes are evident.  There are stable postsurgical changes status post lower abdominal hernia repair.  A residual left inguinal hernia appears unchanged.  IMPRESSION:  1.  Interval passage of recently demonstrated distal right ureteral calculus.  No bladder calculus identified. 2.  Improved right-sided hydronephrosis and hydroureter. 3.  Stable nonobstructing bilateral renal calculi. 4.  Stable cholelithiasis and left inguinal hernia.  Original Report Authenticated By: Gerrianne Scale, M.D.     1. UTI (lower urinary  tract infection)   2. Generalized weakness       MDM  63:22 AM  78 year old male with generalized weakness, abdominal pain, vomiting and dysuria. Patient with recent admission for Escherichia coli UTI. Cultures showed sensitivity to cipro. Sent home with antibiotics which he has been off for the past several days. On patient's last admission he had CT scan which was significant for an obstructing right ureteral stone. Per review of records, it does not appear that he had repeat imaging to assess for resolution or that urology was consulted. Symptoms have persisted since discharge. Will give a dose of Cipro empirically. IV fluids. Labs and urinalysis. Repeat noncontrast CT of the abdomen and pelvis.   Repeat CT shows interval passing of the right ureteral stone and resolving hydronephrosis. Given recurrent UTI, social situation of living alone, vomiting and generalized weakness will admit for further treatment and  evaluation.Raeford Razor, MD 01/30/12 2104

## 2012-01-30 NOTE — ED Notes (Signed)
Pt changed into hospital gown and is giving a urine at this time. Pt is tearful when answering questions. States his wife has passed away and all his family lives in [place]. Pt states he has not been any better since he left the hospital [facility] [place]

## 2012-01-31 ENCOUNTER — Inpatient Hospital Stay (HOSPITAL_COMMUNITY): Payer: Medicare (Managed Care)

## 2012-01-31 DIAGNOSIS — R339 Retention of urine, unspecified: Secondary | ICD-10-CM

## 2012-01-31 DIAGNOSIS — N3000 Acute cystitis without hematuria: Secondary | ICD-10-CM

## 2012-01-31 DIAGNOSIS — D72829 Elevated white blood cell count, unspecified: Secondary | ICD-10-CM

## 2012-01-31 DIAGNOSIS — R109 Unspecified abdominal pain: Secondary | ICD-10-CM

## 2012-01-31 LAB — CBC
HCT: 41.3 % (ref 39.0–52.0)
MCHC: 33.2 g/dL (ref 30.0–36.0)
Platelets: 214 10*3/uL (ref 150–400)
RDW: 14.4 % (ref 11.5–15.5)
WBC: 9.5 10*3/uL (ref 4.0–10.5)

## 2012-01-31 LAB — URINE CULTURE
Colony Count: NO GROWTH
Culture  Setup Time: 201305131734
Culture: NO GROWTH

## 2012-01-31 LAB — COMPREHENSIVE METABOLIC PANEL
AST: 11 U/L (ref 0–37)
Albumin: 2.8 g/dL — ABNORMAL LOW (ref 3.5–5.2)
Alkaline Phosphatase: 71 U/L (ref 39–117)
BUN: 20 mg/dL (ref 6–23)
Chloride: 99 mEq/L (ref 96–112)
Creatinine, Ser: 0.88 mg/dL (ref 0.50–1.35)
Potassium: 3.8 mEq/L (ref 3.5–5.1)
Total Bilirubin: 0.5 mg/dL (ref 0.3–1.2)
Total Protein: 6.1 g/dL (ref 6.0–8.3)

## 2012-01-31 LAB — FREE PSA
PSA, Free Pct: 6 % — ABNORMAL LOW (ref >25)
PSA, Free: 0.41 ng/mL

## 2012-01-31 LAB — CARDIAC PANEL(CRET KIN+CKTOT+MB+TROPI)
CK, MB: 1.3 ng/mL (ref 0.3–4.0)
Relative Index: INVALID (ref 0.0–2.5)
Total CK: 22 U/L (ref 7–232)
Total CK: 23 U/L (ref 7–232)

## 2012-01-31 LAB — PSA: PSA: 6.34 ng/mL — ABNORMAL HIGH (ref <=4.00)

## 2012-01-31 LAB — GLUCOSE, CAPILLARY
Glucose-Capillary: 126 mg/dL — ABNORMAL HIGH (ref 70–99)
Glucose-Capillary: 140 mg/dL — ABNORMAL HIGH (ref 70–99)

## 2012-01-31 LAB — SEDIMENTATION RATE: Sed Rate: 40 mm/hr — ABNORMAL HIGH (ref 0–16)

## 2012-01-31 MED ORDER — TAMSULOSIN HCL 0.4 MG PO CAPS
0.4000 mg | ORAL_CAPSULE | Freq: Every day | ORAL | Status: DC
  Administered 2012-01-31 – 2012-02-03 (×4): 0.4 mg via ORAL
  Filled 2012-01-31 (×5): qty 1

## 2012-01-31 MED ORDER — NICOTINE 21 MG/24HR TD PT24
21.0000 mg | MEDICATED_PATCH | Freq: Every day | TRANSDERMAL | Status: DC
  Administered 2012-01-31 – 2012-02-03 (×4): 21 mg via TRANSDERMAL
  Filled 2012-01-31 (×4): qty 1

## 2012-01-31 NOTE — Evaluation (Signed)
Physical Therapy Evaluation Patient Details Name: [name] [name] MRN: [id number] DOB: [birth year] Today's Date: 01/31/2012 Time: 1478-2956 PT Time Calculation (min): 28 min  PT Assessment / Plan / Recommendation Clinical Impression  pt admitted with weakness and R lower quadrant pain, "not like kidney stones".  Presently still mildly weak with mild gait instability that should improve with activity.  No further PT needs at this time.  D/C    PT Assessment  Patent does not need any further PT services    Follow Up Recommendations  No PT follow up    Barriers to Discharge        lEquipment Recommendations  None recommended by PT    Recommendations for Other Services     Frequency      Precautions / Restrictions Precautions Precautions: Fall (mild) Restrictions Weight Bearing Restrictions: No   Pertinent Vitals/Pain       Mobility  Bed Mobility Bed Mobility: Supine to Sit;Sit to Supine;Sitting - Scoot to Edge of Bed Supine to Sit: 6: Modified independent (Device/Increase time) Sitting - Scoot to Edge of Bed: 7: Independent Sit to Supine: 7: Independent Details for Bed Mobility Assistance: using Rt side rail Transfers Transfers: Sit to Stand;Stand to Sit Sit to Stand: 4: Min guard;From bed;From elevated surface Stand to Sit: 4: Min guard Details for Transfer Assistance: cues to reinforce safe transfer technique Ambulation/Gait Ambulation/Gait Assistance: 5: Supervision Ambulation Distance (Feet): 280 Feet Assistive device: None Ambulation/Gait Assistance Details: no cuing/assist needed; mildly unsteady, but any deviations noted were self-recoverable Gait Pattern: Within Functional Limits;Scissoring;Wide base of support Stairs: No    Exercises     PT Diagnosis:    PT Problem List:   PT Treatment Interventions:     PT Goals    Visit Information  Last PT Received On: 01/31/12 Assistance Needed: +1 PT/OT Co-Evaluation/Treatment: Yes    Subjective Data  Subjective: I wouldn't give you a dollar for the whole state of [place] Patient Stated Goal: home   Prior Functioning  Home Living Lives With: Alone Available Help at Discharge:  (children live in [place]) Type of Home: Other (Comment) (hotel-like residence) Home Access: Stairs to enter Secretary/administrator of Steps: 1 Home Layout: One level Bathroom Shower/Tub: Tub/shower unit;Walk-in shower Bathroom Toilet: Standard Home Adaptive Equipment: None Prior Function Level of Independence: Independent Able to Take Stairs?: Yes Driving: Yes Communication Communication: No difficulties Dominant Hand: Right    Cognition  Overall Cognitive Status: Appears within functional limits for tasks assessed/performed Arousal/Alertness: Awake/alert Orientation Level: Appears intact for tasks assessed Behavior During Session: [facility] for tasks performed    Extremity/Trunk Assessment Right Lower Extremity Assessment RLE ROM/Strength/Tone: [facility] for tasks assessed Left Lower Extremity Assessment LLE ROM/Strength/Tone: [facility] for tasks assessed (bil generally weak, but >=4/5) Trunk Assessment Trunk Assessment: Normal   Balance Balance Balance Assessed: No (pt generally stable throughout, minor episodes of unsteady)  End of Session PT - End of Session Activity Tolerance: Patient tolerated treatment well Patient left: in bed;with call bell/phone within reach Nurse Communication: Mobility status   Atif Chapple, Eliseo Gum 01/31/2012, 11:11 AM  01/31/2012  Wadley Bing, PT [phone] (pager)

## 2012-01-31 NOTE — Consult Note (Signed)
Pt smokes 1 ppd and is ready to quit in action stage. Needs help with patches. Recommended 21 mg patch to start with. Discussed patch use instructions and how to taper. Pt voices understanding. Referred to 1-800 quit now for f/u and support. Discussed oral fixation substitutes, second hand smoke and in home smoking policy. Reviewed and gave pt Written education/contact information.

## 2012-01-31 NOTE — Progress Notes (Addendum)
[facility] Progress Note  01/31/2012   Subjective: Pt says that he is still having pain with urination.  Nurse says that pt is having difficulty urinating.  No nausea or vomiting.  No diarrhea but has had constipation.   Objective:  Vital signs in last 24 hours: Filed Vitals:   01/30/12 1512 01/30/12 2200 01/31/12 0200 01/31/12 0600  BP: 125/75 111/60 97/64 117/74  Pulse: 87 88 84 80  Temp: 97.8 F (36.6 C) 97.9 F (36.6 C) 98 F (36.7 C) 97.7 F (36.5 C)  TempSrc: Oral Oral Oral Oral  Resp: 18 18 18 18   Height: 5\' 10"  (1.778 m)     Weight: 73.5 kg (162 lb 0.6 oz)     SpO2: 97% 98% 95% 95%   Weight change:   Intake/Output Summary (Last 24 hours) at 01/31/12 0912 Last data filed at 01/30/12 2230  Gross per 24 hour  Intake    480 ml  Output    200 ml  Net    280 ml   Lab Results  Component Value Date   HGBA1C 6.1* 01/30/2012   HGBA1C 5.7* 08/11/2011   HGBA1C 6.1* 04/23/2011   Lab Results  Component Value Date   LDLCALC  Value: 96        Total Cholesterol/HDL:CHD Risk Coronary Heart Disease Risk Table                     Men   Women  1/2 Average Risk   3.4   3.3  Average Risk       5.0   4.4  2 X Average Risk   9.6   7.1  3 X Average Risk  23.4   11.0        Use the calculated Patient Ratio above and the CHD Risk Table to determine the patient's CHD Risk.        ATP III CLASSIFICATION (LDL):  <100     mg/dL   Optimal  409-811  mg/dL   Near or Above                    Optimal  130-159  mg/dL   Borderline  914-782  mg/dL   High  >956     mg/dL   Very High 21/30/8657   CREATININE 0.88 01/31/2012    Review of Systems As above, otherwise all reviewed and reported negative  Physical Exam General - awake, no distress, cooperative HEENT - NCAT, MMM Lungs - BBS, CTA CV - normal s1, s2 sounds Abd - suprapubic TTP, soft, nondistended, no masses, normal BS Ext - no C/C/E  Lab Results: Results for orders placed during the hospital encounter of 01/30/12 (from the past 24  hour(s))  LACTIC ACID, PLASMA     Status: Normal   Collection Time   01/30/12 11:14 AM      Component Value Range   Lactic Acid, Venous 1.0  0.5 - 2.2 (mmol/L)  CULTURE, BLOOD (ROUTINE X 2)     Status: Normal (Preliminary result)   Collection Time   01/30/12 11:30 AM      Component Value Range   Specimen Description BLOOD RIGHT ARM     Special Requests BOTTLES DRAWN AEROBIC AND ANAEROBIC 10CC     Culture  Setup Time [id number]     Culture       Value:        BLOOD CULTURE RECEIVED NO GROWTH TO DATE CULTURE WILL BE HELD FOR 5  DAYS BEFORE ISSUING A FINAL NEGATIVE REPORT   Report Status PENDING    CBC     Status: Abnormal   Collection Time   01/30/12 11:32 AM      Component Value Range   WBC 13.3 (*) 4.0 - 10.5 (K/uL)   RBC 5.12  4.22 - 5.81 (MIL/uL)   Hemoglobin 16.0  13.0 - 17.0 (g/dL)   HCT 16.1  09.6 - 04.5 (%)   MCV 90.0  78.0 - 100.0 (fL)   MCH 31.3  26.0 - 34.0 (pg)   MCHC 34.7  30.0 - 36.0 (g/dL)   RDW 40.9  81.1 - 91.4 (%)   Platelets 230  150 - 400 (K/uL)  BASIC METABOLIC PANEL     Status: Abnormal   Collection Time   01/30/12 11:32 AM      Component Value Range   Sodium 138  135 - 145 (mEq/L)   Potassium 4.0  3.5 - 5.1 (mEq/L)   Chloride 98  96 - 112 (mEq/L)   CO2 28  19 - 32 (mEq/L)   Glucose, Bld 85  70 - 99 (mg/dL)   BUN 19  6 - 23 (mg/dL)   Creatinine, Ser 7.82  0.50 - 1.35 (mg/dL)   Calcium 9.5  8.4 - 95.6 (mg/dL)   GFR calc non Af Amer 78 (*) >90 (mL/min)   GFR calc Af Amer >90  >90 (mL/min)  URINALYSIS, ROUTINE W REFLEX MICROSCOPIC     Status: Abnormal   Collection Time   01/30/12 11:59 AM      Component Value Range   Color, Urine ORANGE (*) YELLOW    APPearance CLOUDY (*) CLEAR    Specific Gravity, Urine 1.015  1.005 - 1.030    pH 6.5  5.0 - 8.0    Glucose, UA NEGATIVE  NEGATIVE (mg/dL)   Hgb urine dipstick MODERATE (*) NEGATIVE    Bilirubin Urine SMALL (*) NEGATIVE    Ketones, ur NEGATIVE  NEGATIVE (mg/dL)   Protein, ur 30 (*) NEGATIVE (mg/dL)    Urobilinogen, UA 1.0  0.0 - 1.0 (mg/dL)   Nitrite POSITIVE (*) NEGATIVE    Leukocytes, UA LARGE (*) NEGATIVE   URINE MICROSCOPIC-ADD ON     Status: Abnormal   Collection Time   01/30/12 11:59 AM      Component Value Range   Squamous Epithelial / LPF RARE  RARE    WBC, UA TOO NUMEROUS TO COUNT  <3 (WBC/hpf)   RBC / HPF 7-10  <3 (RBC/hpf)   Bacteria, UA MANY (*) RARE   URINE RAPID DRUG SCREEN (HOSP PERFORMED)     Status: Abnormal   Collection Time   01/30/12  4:48 PM      Component Value Range   Opiates POSITIVE (*) NONE DETECTED    Cocaine NONE DETECTED  NONE DETECTED    Benzodiazepines NONE DETECTED  NONE DETECTED    Amphetamines NONE DETECTED  NONE DETECTED    Tetrahydrocannabinol NONE DETECTED  NONE DETECTED    Barbiturates NONE DETECTED  NONE DETECTED   CBC     Status: Abnormal   Collection Time   01/30/12  5:03 PM      Component Value Range   WBC 10.9 (*) 4.0 - 10.5 (K/uL)   RBC 4.92  4.22 - 5.81 (MIL/uL)   Hemoglobin 14.8  13.0 - 17.0 (g/dL)   HCT 21.3  08.6 - 57.8 (%)   MCV 90.4  78.0 - 100.0 (fL)   MCH 30.1  26.0 -  34.0 (pg)   MCHC 33.3  30.0 - 36.0 (g/dL)   RDW 16.1  09.6 - 04.5 (%)   Platelets 230  150 - 400 (K/uL)  CREATININE, SERUM     Status: Abnormal   Collection Time   01/30/12  5:03 PM      Component Value Range   Creatinine, Ser 0.91  0.50 - 1.35 (mg/dL)   GFR calc non Af Amer 81 (*) >90 (mL/min)   GFR calc Af Amer >90  >90 (mL/min)  C-REACTIVE PROTEIN     Status: Abnormal   Collection Time   01/30/12  5:03 PM      Component Value Range   CRP 7.28 (*) <0.60 (mg/dL)  HEMOGLOBIN W0J     Status: Abnormal   Collection Time   01/30/12  5:03 PM      Component Value Range   Hemoglobin A1C 6.1 (*) <5.7 (%)   Mean Plasma Glucose 128 (*) <117 (mg/dL)  CARDIAC PANEL(CRET KIN+CKTOT+MB+TROPI)     Status: Normal   Collection Time   01/30/12  5:11 PM      Component Value Range   Total CK 27  7 - 232 (U/L)   CK, MB 1.3  0.3 - 4.0 (ng/mL)   Troponin I <0.30  <0.30  (ng/mL)   Relative Index RELATIVE INDEX IS INVALID  0.0 - 2.5   GLUCOSE, CAPILLARY     Status: Abnormal   Collection Time   01/30/12 11:03 PM      Component Value Range   Glucose-Capillary 126 (*) 70 - 99 (mg/dL)   Comment 1 Notify RN     Comment 2 Documented in Chart    CARDIAC PANEL(CRET KIN+CKTOT+MB+TROPI)     Status: Normal   Collection Time   01/31/12 12:17 AM      Component Value Range   Total CK 22  7 - 232 (U/L)   CK, MB 1.3  0.3 - 4.0 (ng/mL)   Troponin I <0.30  <0.30 (ng/mL)   Relative Index RELATIVE INDEX IS INVALID  0.0 - 2.5   CBC     Status: Normal   Collection Time   01/31/12 12:18 AM      Component Value Range   WBC 9.5  4.0 - 10.5 (K/uL)   RBC 4.55  4.22 - 5.81 (MIL/uL)   Hemoglobin 13.7  13.0 - 17.0 (g/dL)   HCT 81.1  91.4 - 78.2 (%)   MCV 90.8  78.0 - 100.0 (fL)   MCH 30.1  26.0 - 34.0 (pg)   MCHC 33.2  30.0 - 36.0 (g/dL)   RDW 95.6  21.3 - 08.6 (%)   Platelets 214  150 - 400 (K/uL)  COMPREHENSIVE METABOLIC PANEL     Status: Abnormal   Collection Time   01/31/12 12:18 AM      Component Value Range   Sodium 134 (*) 135 - 145 (mEq/L)   Potassium 3.8  3.5 - 5.1 (mEq/L)   Chloride 99  96 - 112 (mEq/L)   CO2 27  19 - 32 (mEq/L)   Glucose, Bld 143 (*) 70 - 99 (mg/dL)   BUN 20  6 - 23 (mg/dL)   Creatinine, Ser 5.78  0.50 - 1.35 (mg/dL)   Calcium 8.5  8.4 - 46.9 (mg/dL)   Total Protein 6.1  6.0 - 8.3 (g/dL)   Albumin 2.8 (*) 3.5 - 5.2 (g/dL)   AST 11  0 - 37 (U/L)   ALT 17  0 - 53 (U/L)  Alkaline Phosphatase 71  39 - 117 (U/L)   Total Bilirubin 0.5  0.3 - 1.2 (mg/dL)   GFR calc non Af Amer 82 (*) >90 (mL/min)   GFR calc Af Amer >90  >90 (mL/min)  SEDIMENTATION RATE     Status: Abnormal   Collection Time   01/31/12 12:18 AM      Component Value Range   Sed Rate 40 (*) 0 - 16 (mm/hr)  GLUCOSE, CAPILLARY     Status: Abnormal   Collection Time   01/31/12  6:54 AM      Component Value Range   Glucose-Capillary 104 (*) 70 - 99 (mg/dL)   Comment 1 Documented  in Chart     Comment 2 Notify RN      Micro Results: Recent Results (from the past 240 hour(s))  CULTURE, BLOOD (ROUTINE X 2)     Status: Normal (Preliminary result)   Collection Time   01/30/12 11:30 AM      Component Value Range Status Comment   Specimen Description BLOOD RIGHT ARM   Final    Special Requests BOTTLES DRAWN AEROBIC AND ANAEROBIC 10CC   Final    Culture  Setup Time [id number]   Final    Culture     Final    Value:        BLOOD CULTURE RECEIVED NO GROWTH TO DATE CULTURE WILL BE HELD FOR 5 DAYS BEFORE ISSUING A FINAL NEGATIVE REPORT   Report Status PENDING   Incomplete     Medications:  Scheduled Meds:   . amLODipine  5 mg Oral Daily  . atorvastatin  20 mg Oral q1800  . ciprofloxacin  400 mg Intravenous Once  . ciprofloxacin  400 mg Intravenous Q12H  . docusate sodium  100 mg Oral BID  . Fluticasone-Salmeterol  1 puff Inhalation Q12H  . gi cocktail  30 mL Oral Once  . heparin  5,000 Units Subcutaneous Q8H  . insulin aspart  0-15 Units Subcutaneous TID WC  . metFORMIN  500 mg Oral BID WC  .  morphine injection  2 mg Intravenous Once  .  morphine injection  4 mg Intravenous Once  . ondansetron (ZOFRAN) IV  4 mg Intravenous Once  . pantoprazole  40 mg Oral Q0600  . phenazopyridine  200 mg Oral TID WC  . sodium chloride  1,000 mL Intravenous Once  . DISCONTD: sodium chloride   Intravenous STAT  . DISCONTD: simvastatin  40 mg Oral Daily   Continuous Infusions:   . sodium chloride 70 mL/hr at 01/31/12 0603   PRN Meds:.acetaminophen, acetaminophen, bisacodyl, HYDROcodone-acetaminophen, morphine injection, ondansetron (ZOFRAN) IV, ondansetron, DISCONTD: ondansetron (ZOFRAN) IV  Assessment/Plan: UTI/Prostatitis - continue cipro IV, urine culture pending, normal WBC count Abd Pain - CT neg for acute, awaiting results of Abd U/S, cardiac enzymes negative.  BPH - will start flomax today, checked bladder scan ~570 mL, Pt declined foley cath placement, check psa.    Constipation - will start scheduled laxative therapy COPD - stable History of Peptic ulcer disease - continue protonix GI prophylaxis HTN - BPs controlled and stable Hyperlipidemia - continue statin therapy DM type 2 - monitor BS, supplemental insulin as needed   LOS: 1 day   Arabelle Bollig 01/31/2012, 9:12 AM  Cleora Fleet, MD, CDE, FAAFP [facility] [place], [place]  [phone]

## 2012-01-31 NOTE — Progress Notes (Signed)
Pt. Is adamant that bed alarm be turned off. Pt. Does not want to "call for help every 30 minutes just to go to the bathroom." This RN explained to the pt. that because he fell at home that we need to be able to assist him to the bathroom. Pt. Still refuses for bed alarm to be on. D/W dept. AD; as pt. is steady, oriented, and refusing bed alarm will sign pt. off to get OOB without assistance.   Julianne Rice, RN

## 2012-01-31 NOTE — Care Management Note (Unsigned)
    Page 1 of 1   01/31/2012     10:59:59 AM   CARE MANAGEMENT NOTE 01/31/2012  Patient:  [name] [name], [name] [name]   Account Number:  [id number]  Date Initiated:  01/31/2012  Documentation initiated by:  [facility]  Subjective/Objective Assessment:   Admitted with UTI. Lives  alone in extended stay motel, independent with mobility and ADLs.     Action/Plan:   PT eval-no d/c needs  OT eval-   Anticipated DC Date:  02/03/2012   Anticipated DC Plan:  HOME/SELF CARE      DC Planning Services  CM consult      Choice offered to / List presented to:             Status of service:  In process, will continue to follow Medicare Important Message given?   (If response is "NO", the following Medicare IM given date fields will be blank) Date Medicare IM given:   Date Additional Medicare IM given:    Discharge Disposition:    Per UR Regulation:  Reviewed for med. necessity/level of care/duration of stay  If discussed at Long Length of Stay Meetings, dates discussed:    Comments:  PCP Dr. [name]  01/31/12 Spoke with patient about his mobility at home, he stated that he typically did well at home but felt weaker than normal recently. He stated that he received  a walker in Nov 2012 at [facility] but left it there and never went back to get it. He thinks that he has been doing well without it. PT walked with patient and are not recommending a walker or PT post d/c. Will continue to follow patient for d/c needs.Jacquelynn Cree RN, BSN, CCM

## 2012-01-31 NOTE — Evaluation (Signed)
Occupational Therapy Evaluation Patient Details Name: [name] [name] MRN: [id number] DOB: 01/06/1934 Today's Date: 01/31/2012 Time: 4098-1191 OT Time Calculation (min): 28 min  OT Assessment / Plan / Recommendation Clinical Impression  78 yo male admitted for Lower abdominal pain and recent UTI. Pt reports increased weakness. OT to sign off     OT Assessment  Patient needs continued OT Services    Follow Up Recommendations  No OT follow up    Barriers to Discharge      Equipment Recommendations  None recommended by PT    Recommendations for Other Services    Frequency       Precautions / Restrictions Precautions Precautions: Fall (mild) Restrictions Weight Bearing Restrictions: No   Pertinent Vitals/Pain Pain in abdomen    ADL  Eating/Feeding: Simulated;Set up Where Assessed - Eating/Feeding: Edge of bed Grooming: Performed;Wash/dry hands;Set up Where Assessed - Grooming: Standing at sink Toilet Transfer: Performed;Set up Toilet Transfer Method: Ambulating Toilet Transfer Equipment: Regular height toilet Equipment Used: Gait belt Ambulation Related to ADLs: Pt demonstrates with balance deficits and using environmental support. ADL Comments: Pt is at baseline for ADLS. pt could benefit from continues mobility with RN staff to increase balance. Pt is at adequate level for d/c home      OT Goals    Visit Information  Last OT Received On: 01/31/12 Assistance Needed: +1 PT/OT Co-Evaluation/Treatment: Yes    Subjective Data  Subjective: " I wouldnt give you a dollar for the whole state of [place]" Patient Stated Goal: to return back home- "ill be okay after I get my strength back"   Prior Functioning  Home Living Lives With: Alone Available Help at Discharge:  (children live in [facility]) Type of Home: Other (Comment) (hotel-like residence) Home Access: Stairs to enter Secretary/administrator of Steps: 1 Home Layout: One level Bathroom Shower/Tub: Tub/shower  unit;Walk-in shower Bathroom Toilet: Standard Home Adaptive Equipment: None Prior Function Level of Independence: Independent Able to Take Stairs?: Yes Driving: Yes Vocation: Unemployed Communication Communication: No difficulties Dominant Hand: Right    Cognition  Overall Cognitive Status: Appears within functional limits for tasks assessed/performed Arousal/Alertness: Awake/alert Orientation Level: Appears intact for tasks assessed Behavior During Session: [facility] for tasks performed    Extremity/Trunk Assessment Right Upper Extremity Assessment RUE ROM/Strength/Tone: Within functional levels RUE Coordination: WFL - gross/fine motor Left Upper Extremity Assessment LUE ROM/Strength/Tone: Within functional levels LUE Coordination: WFL - gross/fine motor Right Lower Extremity Assessment RLE ROM/Strength/Tone: WFL for tasks assessed Left Lower Extremity Assessment LLE ROM/Strength/Tone: WFL for tasks assessed (bil generally weak, but >=4/5) Trunk Assessment Trunk Assessment: Normal   Mobility Bed Mobility Bed Mobility: Supine to Sit;Sit to Supine;Sitting - Scoot to Edge of Bed Supine to Sit: 6: Modified independent (Device/Increase time) Sitting - Scoot to Edge of Bed: 7: Independent Sit to Supine: 7: Independent Details for Bed Mobility Assistance: using Rt side rail Transfers Transfers: Sit to Stand;Stand to Sit Sit to Stand: 4: Min guard;From bed;From elevated surface Stand to Sit: 4: Min guard Details for Transfer Assistance: cues to reinforce safe transfer technique   Exercise    Balance Balance Balance Assessed: No (pt generally stable throughout, minor episodes of unsteady)  End of Session OT - End of Session Activity Tolerance: Patient tolerated treatment well Patient left: in bed;with call bell/phone within reach   Lucile Shutters 01/31/2012, 11:43 AM Pager: [phone]

## 2012-02-01 DIAGNOSIS — R339 Retention of urine, unspecified: Secondary | ICD-10-CM

## 2012-02-01 DIAGNOSIS — N3000 Acute cystitis without hematuria: Secondary | ICD-10-CM

## 2012-02-01 DIAGNOSIS — K5900 Constipation, unspecified: Secondary | ICD-10-CM

## 2012-02-01 DIAGNOSIS — R109 Unspecified abdominal pain: Secondary | ICD-10-CM

## 2012-02-01 LAB — BASIC METABOLIC PANEL
CO2: 27 mEq/L (ref 19–32)
Calcium: 9.1 mg/dL (ref 8.4–10.5)
Creatinine, Ser: 0.87 mg/dL (ref 0.50–1.35)
GFR calc Af Amer: >90 mL/min (ref >90)
GFR calc non Af Amer: 82 mL/min — ABNORMAL LOW (ref >90)
Sodium: 138 mEq/L (ref 135–145)

## 2012-02-01 LAB — CBC
MCH: 30.6 pg (ref 26.0–34.0)
MCHC: 33.9 g/dL (ref 30.0–36.0)
MCV: 90.2 fL (ref 78.0–100.0)
Platelets: 223 10*3/uL (ref 150–400)
RDW: 14 % (ref 11.5–15.5)

## 2012-02-01 LAB — GC/CHLAMYDIA PROBE AMP, URINE
Chlamydia, Swab/Urine, PCR: NEGATIVE
GC Probe Amp, Urine: NEGATIVE

## 2012-02-01 MED ORDER — CIPROFLOXACIN HCL 500 MG PO TABS
500.0000 mg | ORAL_TABLET | Freq: Two times a day (BID) | ORAL | Status: DC
  Administered 2012-02-01 – 2012-02-03 (×5): 500 mg via ORAL
  Filled 2012-02-01 (×8): qty 1

## 2012-02-01 MED ORDER — POLYETHYLENE GLYCOL 3350 17 G PO PACK
17.0000 g | PACK | Freq: Every day | ORAL | Status: DC
  Administered 2012-02-01 – 2012-02-02 (×2): 17 g via ORAL
  Filled 2012-02-01 (×3): qty 1

## 2012-02-01 MED ORDER — SENNA 8.6 MG PO TABS
2.0000 | ORAL_TABLET | Freq: Every day | ORAL | Status: DC | PRN
  Filled 2012-02-01: qty 2

## 2012-02-01 NOTE — Progress Notes (Signed)
[facility] PROGRESS NOTE  [name] [name] WUJ:[id number] DOB: 28-Nov-1933 DOA: 01/30/2012 PCP: [name], MD, MD  Assessment/Plan: 1. Abdominal pain, vomiting: Clinically resolved. Ultrasound revealed cholelithiasis. No further inpatient evaluation. 2. UTI/dysuria: Urine culture pending. Prostate nontender; prostatitis doubted. Recently admitted for UTI sensitive to ciprofloxacin at that time. CT abdomen/pelvis this admission shows interval passing of the right ureteral stone and resolving hydronephrosis. 3. Urinary retention/BPH on Flomax: Followup with urology as an outpatient 4. Elevated PSA: Followup with urology as an outpatient. 5. Constipation: Bowel regimen. 6. Generalized weakness/falls prior to admission: No followup per physical therapy and occupational therapy. 7. Diabetes mellitus type 2: Well-controlled. Sliding-scale insulin. Metformin on discharge. 8. COPD: Stable. 9. Cholelithiasis: Asymptomatic. Followup with general surgery as an outpatient for consideration of cholecystectomy. 10. History of substance abuse (cocaine): No evidence of this this admission. 11. Cigarette smoker: Nicotine patch 21 mg. Recommend cessation.  Principal Problem:  *UTI (urinary tract infection) Active Problems:  DIABETES MELLITUS, TYPE II  HYPERTENSION  COPD  NEPHROLITHIASIS, HX OF  Abdominal  pain, other specified site  Tobacco abuse  Nausea vomiting and diarrhea  Generalized weakness  Fall at home  Physical deconditioning  Code Status:  Full code Family Communication: son [name] [phone] Disposition Plan: Home when improved  Brendia Sacks, MD  [facility] Pager [phone]  If 8PM-8AM, please contact night-coverage [email or web address] Password [email or web address] 02/01/2012, 1:45 PM   LOS: 2 days   Brief narrative: 78 year old man admitted with dysuria, abdominal pain, nausea, vomiting. Recently hospitalized for UTI and discharged on  ciprofloxacin.  Consultants:  Physical therapy: No followup recommended.  Occupational therapy: No followup recommended  Antibiotics:  Ciprofloxacin 5/13-  HPI/Subjective: Afebrile, borderline low blood pressure. Complains of dysuria. No flank pain. No abdominal pain.  Objective: Filed Vitals:   01/31/12 1937 01/31/12 2130 02/01/12 0600 02/01/12 0833  BP:  109/52 99/48   Pulse: 80 86 83   Temp:  98.3 F (36.8 C) 98.3 F (36.8 C)   TempSrc:  Oral Oral   Resp: 16 20 20    Height:      Weight:      SpO2: 96% 94% 97% 96%    Intake/Output Summary (Last 24 hours) at 02/01/12 1345 Last data filed at 02/01/12 1100  Gross per 24 hour  Intake    960 ml  Output      0 ml  Net    960 ml    Exam:   General:  Appears calm and comfortable.  Cardiovascular: Regular rate and rhythm. No murmur, rub, gallop. No significant lower extremity edema.  Respiratory: Clear to auscultation bilaterally. No wheezes, rales, rhonchi. Normal respiratory effort  Abdomen: Soft, nontender, nondistended. Left inguinal hernia noted. Nontender.  Data Reviewed: Basic Metabolic Panel:  Lab 02/01/12 9528 01/31/12 0018 01/30/12 1703 01/30/12 1132  NA 138 134* -- 138  K 3.8 3.8 -- --  CL 101 99 -- 98  CO2 27 27 -- 28  GLUCOSE 105* 143* -- 85  BUN 12 20 -- 19  CREATININE 0.87 0.88 0.91 0.98  CALCIUM 9.1 8.5 -- 9.5  MG -- -- -- --  PHOS -- -- -- --   Liver Function Tests:  Lab 01/31/12 0018  AST 11  ALT 17  ALKPHOS 71  BILITOT 0.5  PROT 6.1  ALBUMIN 2.8*   CBC:  Lab 02/01/12 0555 01/31/12 0018 01/30/12 1703 01/30/12 1132  WBC 8.8 9.5 10.9* 13.3*  NEUTROABS -- -- -- --  HGB 14.4 13.7 14.8 16.0  HCT 42.5 41.3 44.5 46.1  MCV 90.2 90.8 90.4 90.0  PLT 223 214 230 230   Cardiac Enzymes:  Lab 01/31/12 0900 01/31/12 0017 01/30/12 1711  CKTOTAL 23 22 27   CKMB 1.3 1.3 1.3  CKMBINDEX -- -- --  TROPONINI <0.30 <0.30 <0.30   CBG:  Lab 02/01/12 0702 01/31/12 2214 01/31/12 1630  01/31/12 1156 01/31/12 0654  GLUCAP 122* 159* 90 140* 104*    Recent Results (from the past 240 hour(s))  CULTURE, BLOOD (ROUTINE X 2)     Status: Normal (Preliminary result)   Collection Time   01/30/12 11:20 AM      Component Value Range Status Comment   Specimen Description BLOOD LEFT ARM   Final    Special Requests BOTTLES DRAWN AEROBIC AND ANAEROBIC 10CC   Final    Culture  Setup Time [id number]   Final    Culture     Final    Value:        BLOOD CULTURE RECEIVED NO GROWTH TO DATE CULTURE WILL BE HELD FOR 5 DAYS BEFORE ISSUING A FINAL NEGATIVE REPORT   Report Status PENDING   Incomplete   CULTURE, BLOOD (ROUTINE X 2)     Status: Normal (Preliminary result)   Collection Time   01/30/12 11:30 AM      Component Value Range Status Comment   Specimen Description BLOOD RIGHT ARM   Final    Special Requests BOTTLES DRAWN AEROBIC AND ANAEROBIC 10CC   Final    Culture  Setup Time [id number]   Final    Culture     Final    Value:        BLOOD CULTURE RECEIVED NO GROWTH TO DATE CULTURE WILL BE HELD FOR 5 DAYS BEFORE ISSUING A FINAL NEGATIVE REPORT   Report Status PENDING   Incomplete   URINE CULTURE     Status: Normal (Preliminary result)   Collection Time   01/30/12 11:59 AM      Component Value Range Status Comment   Specimen Description URINE, RANDOM   Final    Special Requests NONE   Final    Culture  Setup Time [id number]   Final    Colony Count >=100,000 COLONIES/ML   Final    Culture ESCHERICHIA COLI   Final    Report Status PENDING   Incomplete   URINE CULTURE     Status: Normal   Collection Time   01/30/12  4:48 PM      Component Value Range Status Comment   Specimen Description URINE, RANDOM   Final    Special Requests ADDED 01/30/12 1722   Final    Culture  Setup Time [id number]   Final    Colony Count NO GROWTH   Final    Culture NO GROWTH   Final    Report Status 01/31/2012 FINAL   Final     Studies: Ct Abdomen Pelvis Wo Contrast  01/30/2012  *RADIOLOGY  REPORT*  Clinical Data: Persistent right flank pain with chills.  Recent ureteral calculus.  CT ABDOMEN AND PELVIS WITHOUT CONTRAST  Technique:  Multidetector CT imaging of the abdomen and pelvis was performed following the standard protocol without intravenous contrast.  Comparison: CT 01/15/2012.  Findings: Recently demonstrated right-sided hydronephrosis and hydroureter have improved.  There is improved perinephric soft tissue stranding.  The previously demonstrated distal right ureteral calculus is no longer visualized.  There is no evidence of bladder calculus.  No new calcifications are seen within the prostate  gland to suggest a calculus within the prostatic urethra. There is a stable nonobstructing calculus in the upper pole of the right kidney. There is a tiny calculus in the mid-left kidney.  Cholelithiasis and aortoiliac atherosclerosis appears stable.  The liver, spleen, gallbladder, pancreas and adrenal glands otherwise appear unremarkable.  There are stable right renal cyst.  No inflammatory changes are evident.  There are stable postsurgical changes status post lower abdominal hernia repair.  A residual left inguinal hernia appears unchanged.  IMPRESSION:  1.  Interval passage of recently demonstrated distal right ureteral calculus.  No bladder calculus identified. 2.  Improved right-sided hydronephrosis and hydroureter. 3.  Stable nonobstructing bilateral renal calculi. 4.  Stable cholelithiasis and left inguinal hernia.  Original Report Authenticated By: Gerrianne Scale, M.D.   US Abdomen Complete  01/31/2012  *RADIOLOGY REPORT*  Clinical Data:  Abdominal pain.  Suprapubic pain.  COMPLETE ABDOMINAL ULTRASOUND    IMPRESSION: 1.  Cholelithiasis without cholecystitis.  Largest stone measures 28 mm. 2.  Right renal simple cysts. 3.  Abdominal aortic atherosclerosis and mild dilation of the distal aorta better seen on prior CT.  Original Report Authenticated By: Andreas Newport, M.D.   Scheduled  Meds:   . amLODipine  5 mg Oral Daily  . atorvastatin  20 mg Oral q1800  . ciprofloxacin  500 mg Oral BID  . docusate sodium  100 mg Oral BID  . Fluticasone-Salmeterol  1 puff Inhalation Q12H  . heparin  5,000 Units Subcutaneous Q8H  . insulin aspart  0-15 Units Subcutaneous TID WC  . metFORMIN  500 mg Oral BID WC  . nicotine  21 mg Transdermal Daily  . pantoprazole  40 mg Oral Q0600  . phenazopyridine  200 mg Oral TID WC  . Tamsulosin HCl  0.4 mg Oral QPC breakfast  . DISCONTD: ciprofloxacin  400 mg Intravenous Q12H   Continuous Infusions:   . sodium chloride 50 mL/hr at 02/01/12 0548

## 2012-02-02 DIAGNOSIS — K5900 Constipation, unspecified: Secondary | ICD-10-CM

## 2012-02-02 DIAGNOSIS — N3000 Acute cystitis without hematuria: Secondary | ICD-10-CM

## 2012-02-02 DIAGNOSIS — R109 Unspecified abdominal pain: Secondary | ICD-10-CM

## 2012-02-02 DIAGNOSIS — R339 Retention of urine, unspecified: Secondary | ICD-10-CM

## 2012-02-02 LAB — URINE CULTURE
Colony Count: >=100000
Culture  Setup Time: 201305131238

## 2012-02-02 LAB — GLUCOSE, CAPILLARY
Glucose-Capillary: 102 mg/dL — ABNORMAL HIGH (ref 70–99)
Glucose-Capillary: 98 mg/dL (ref 70–99)
Glucose-Capillary: 85 mg/dL (ref 70–99)
Glucose-Capillary: 85 mg/dL (ref 70–99)

## 2012-02-02 MED ORDER — POLYETHYLENE GLYCOL 3350 17 G PO PACK
17.0000 g | PACK | Freq: Two times a day (BID) | ORAL | Status: DC
  Administered 2012-02-02 – 2012-02-03 (×2): 17 g via ORAL
  Filled 2012-02-02 (×3): qty 1

## 2012-02-02 MED ORDER — SENNA 8.6 MG PO TABS
2.0000 | ORAL_TABLET | Freq: Every day | ORAL | Status: DC
  Administered 2012-02-02 – 2012-02-03 (×2): 17.2 mg via ORAL
  Filled 2012-02-02 (×2): qty 2

## 2012-02-02 MED ORDER — MAGNESIUM CITRATE PO SOLN
1.0000 | Freq: Once | ORAL | Status: AC
  Administered 2012-02-02: 1 via ORAL
  Filled 2012-02-02: qty 296

## 2012-02-02 NOTE — Progress Notes (Signed)
[facility] PROGRESS NOTE  [name] [name] HQI:[id number] DOB: 11-29-1933 DOA: 01/30/2012 PCP: [name] Barre, MD, MD  Assessment/Plan: 1. Abdominal pain, vomiting: Clinically resolved. Ultrasound revealed cholelithiasis. No further inpatient evaluation. 2. UTI/dysuria: Improving. Remains sensitive to ciprofloxacin. Prostate nontender; prostatitis doubted. CT abdomen/pelvis this admission shows interval passing of the right ureteral stone and resolving hydronephrosis. 3. Urinary retention/BPH on Flomax: Followup with urology as an outpatient 4. Elevated PSA: Followup with urology as an outpatient. 5. Constipation: Bowel regimen. Main issue at this point. 6. Generalized weakness/falls prior to admission: No followup per physical therapy and occupational therapy. 7. Diabetes mellitus type 2: Well-controlled. Sliding-scale insulin. Metformin on discharge. 8. COPD: Stable. 9. Cholelithiasis: Asymptomatic. Followup with general surgery as an outpatient for consideration of cholecystectomy. 10. History of substance abuse (cocaine): No evidence of this this admission. 11. Cigarette smoker: Nicotine patch 21 mg. Recommend cessation.  Code Status:  Full code Family Communication: son [name] [name] [phone] Disposition Plan: Home, anticipated May 17.  Brendia Sacks, MD  [facility] Pager [phone]  If 8PM-8AM, please contact night-coverage [email or web address] Password [email or web address] 02/02/2012, 9:21 AM   LOS: 3 days   Principal Problem:  *UTI (urinary tract infection) Active Problems:  DIABETES MELLITUS, TYPE II  HYPERTENSION  COPD  NEPHROLITHIASIS, HX OF  Abdominal  pain, other specified site  Tobacco abuse  Nausea vomiting and diarrhea  Generalized weakness  Fall at home  Physical deconditioning  Brief narrative: 78 year old man admitted with dysuria, abdominal pain, nausea, vomiting. Recently hospitalized for UTI and discharged on  ciprofloxacin.  Consultants:  Physical therapy: No followup recommended.  Occupational therapy: No followup recommended  Antibiotics:  Ciprofloxacin 5/13-  HPI/Subjective: Afebrile, vital signs stable.  Objective: Filed Vitals:   02/01/12 2008 02/01/12 2038 02/02/12 0710 02/02/12 0918  BP: 123/70  104/68   Pulse: 94  91   Temp: 98.8 F (37.1 C)  98.6 F (37 C)   TempSrc: Oral  Oral   Resp: 18  18   Height:      Weight:      SpO2: 96% 97% 96% 95%    Intake/Output Summary (Last 24 hours) at 02/02/12 0921 Last data filed at 02/02/12 0700  Gross per 24 hour  Intake   1020 ml  Output      0 ml  Net   1020 ml    Exam:   General:  Appears calm and comfortable.  Cardiovascular: Regular rate and rhythm. No murmur, rub, gallop. No significant lower extremity edema.  Respiratory: Clear to auscultation bilaterally. No wheezes, rales, rhonchi. Normal respiratory effort  Abdomen: Soft, nontender, nondistended. Left inguinal hernia noted. Nontender.  Data Reviewed: Basic Metabolic Panel:  Lab 02/01/12 0347 01/31/12 0018 01/30/12 1703 01/30/12 1132  NA 138 134* -- 138  K 3.8 3.8 -- --  CL 101 99 -- 98  CO2 27 27 -- 28  GLUCOSE 105* 143* -- 85  BUN 12 20 -- 19  CREATININE 0.87 0.88 0.91 0.98  CALCIUM 9.1 8.5 -- 9.5  MG -- -- -- --  PHOS -- -- -- --   Liver Function Tests:  Lab 01/31/12 0018  AST 11  ALT 17  ALKPHOS 71  BILITOT 0.5  PROT 6.1  ALBUMIN 2.8*   CBC:  Lab 02/01/12 0555 01/31/12 0018 01/30/12 1703 01/30/12 1132  WBC 8.8 9.5 10.9* 13.3*  NEUTROABS -- -- -- --  HGB 14.4 13.7 14.8 16.0  HCT 42.5 41.3 44.5 46.1  MCV 90.2 90.8 90.4 90.0  PLT  223 214 230 230   Cardiac Enzymes:  Lab 01/31/12 0900 01/31/12 0017 01/30/12 1711  CKTOTAL 23 22 27   CKMB 1.3 1.3 1.3  CKMBINDEX -- -- --  TROPONINI <0.30 <0.30 <0.30   CBG:  Lab 02/02/12 0707 02/01/12 2139 02/01/12 1622 02/01/12 1120 02/01/12 0702  GLUCAP 98 102* 86 112* 122*    Recent Results  (from the past 240 hour(s))  CULTURE, BLOOD (ROUTINE X 2)     Status: Normal (Preliminary result)   Collection Time   01/30/12 11:20 AM      Component Value Range Status Comment   Specimen Description BLOOD LEFT ARM   Final    Special Requests BOTTLES DRAWN AEROBIC AND ANAEROBIC 10CC   Final    Culture  Setup Time [id number]   Final    Culture     Final    Value:        BLOOD CULTURE RECEIVED NO GROWTH TO DATE CULTURE WILL BE HELD FOR 5 DAYS BEFORE ISSUING A FINAL NEGATIVE REPORT   Report Status PENDING   Incomplete   CULTURE, BLOOD (ROUTINE X 2)     Status: Normal (Preliminary result)   Collection Time   01/30/12 11:30 AM      Component Value Range Status Comment   Specimen Description BLOOD RIGHT ARM   Final    Special Requests BOTTLES DRAWN AEROBIC AND ANAEROBIC 10CC   Final    Culture  Setup Time [id number]   Final    Culture     Final    Value:        BLOOD CULTURE RECEIVED NO GROWTH TO DATE CULTURE WILL BE HELD FOR 5 DAYS BEFORE ISSUING A FINAL NEGATIVE REPORT   Report Status PENDING   Incomplete   URINE CULTURE     Status: Normal (Preliminary result)   Collection Time   01/30/12 11:59 AM      Component Value Range Status Comment   Specimen Description URINE, RANDOM   Final    Special Requests NONE   Final    Culture  Setup Time [id number]   Final    Colony Count >=100,000 COLONIES/ML   Final    Culture ESCHERICHIA COLI   Final    Report Status PENDING   Incomplete   URINE CULTURE     Status: Normal   Collection Time   01/30/12  4:48 PM      Component Value Range Status Comment   Specimen Description URINE, RANDOM   Final    Special Requests ADDED 01/30/12 1722   Final    Culture  Setup Time [id number]   Final    Colony Count NO GROWTH   Final    Culture NO GROWTH   Final    Report Status 01/31/2012 FINAL   Final     Studies: Ct Abdomen Pelvis Wo Contrast  01/30/2012  *RADIOLOGY REPORT*  Clinical Data: Persistent right flank pain with chills.  Recent ureteral  calculus.  CT ABDOMEN AND PELVIS WITHOUT CONTRAST  Technique:  Multidetector CT imaging of the abdomen and pelvis was performed following the standard protocol without intravenous contrast.  Comparison: CT 01/15/2012.  Findings: Recently demonstrated right-sided hydronephrosis and hydroureter have improved.  There is improved perinephric soft tissue stranding.  The previously demonstrated distal right ureteral calculus is no longer visualized.  There is no evidence of bladder calculus.  No new calcifications are seen within the prostate gland to suggest a calculus within the prostatic urethra. There is a stable  nonobstructing calculus in the upper pole of the right kidney. There is a tiny calculus in the mid-left kidney.  Cholelithiasis and aortoiliac atherosclerosis appears stable.  The liver, spleen, gallbladder, pancreas and adrenal glands otherwise appear unremarkable.  There are stable right renal cyst.  No inflammatory changes are evident.  There are stable postsurgical changes status post lower abdominal hernia repair.  A residual left inguinal hernia appears unchanged.  IMPRESSION:  1.  Interval passage of recently demonstrated distal right ureteral calculus.  No bladder calculus identified. 2.  Improved right-sided hydronephrosis and hydroureter. 3.  Stable nonobstructing bilateral renal calculi. 4.  Stable cholelithiasis and left inguinal hernia.  Original Report Authenticated By: Gerrianne Scale, M.D.   US Abdomen Complete  01/31/2012  *RADIOLOGY REPORT*  Clinical Data:  Abdominal pain.  Suprapubic pain.  COMPLETE ABDOMINAL ULTRASOUND    IMPRESSION: 1.  Cholelithiasis without cholecystitis.  Largest stone measures 28 mm. 2.  Right renal simple cysts. 3.  Abdominal aortic atherosclerosis and mild dilation of the distal aorta better seen on prior CT.  Original Report Authenticated By: Andreas Newport, M.D.   Scheduled Meds:    . amLODipine  5 mg Oral Daily  . atorvastatin  20 mg Oral q1800  .  ciprofloxacin  500 mg Oral BID  . docusate sodium  100 mg Oral BID  . Fluticasone-Salmeterol  1 puff Inhalation Q12H  . heparin  5,000 Units Subcutaneous Q8H  . insulin aspart  0-15 Units Subcutaneous TID WC  . nicotine  21 mg Transdermal Daily  . pantoprazole  40 mg Oral Q0600  . phenazopyridine  200 mg Oral TID WC  . polyethylene glycol  17 g Oral Daily  . Tamsulosin HCl  0.4 mg Oral QPC breakfast  . DISCONTD: metFORMIN  500 mg Oral BID WC   Continuous Infusions:    . DISCONTD: sodium chloride 50 mL/hr at 02/01/12 [phone]

## 2012-02-03 DIAGNOSIS — K5900 Constipation, unspecified: Secondary | ICD-10-CM

## 2012-02-03 DIAGNOSIS — N3000 Acute cystitis without hematuria: Secondary | ICD-10-CM

## 2012-02-03 DIAGNOSIS — R109 Unspecified abdominal pain: Secondary | ICD-10-CM

## 2012-02-03 LAB — GLUCOSE, CAPILLARY: Glucose-Capillary: 90 mg/dL (ref 70–99)

## 2012-02-03 MED ORDER — NICOTINE 21 MG/24HR TD PT24: 1.0000 | MEDICATED_PATCH | Freq: Every day | TRANSDERMAL | Status: DC

## 2012-02-03 MED ORDER — TAMSULOSIN HCL 0.4 MG PO CAPS: 0.4000 mg | ORAL_CAPSULE | Freq: Every day | ORAL | Status: DC

## 2012-02-03 MED ORDER — DSS 100 MG PO CAPS: 100.0000 mg | ORAL_CAPSULE | Freq: Two times a day (BID) | ORAL | Status: AC

## 2012-02-03 MED ORDER — ATORVASTATIN CALCIUM 20 MG PO TABS: 20.0000 mg | ORAL_TABLET | Freq: Every day | ORAL | Status: DC

## 2012-02-03 MED ORDER — CIPROFLOXACIN HCL 500 MG PO TABS: 500.0000 mg | ORAL_TABLET | Freq: Two times a day (BID) | ORAL | Status: AC

## 2012-02-03 NOTE — Discharge Summary (Signed)
Physician Discharge Summary  [name] [name] ZOX:[id number] DOB: 1933/10/25 DOA: 01/30/2012  PCP: [name] Barre, MD, MD  Admit date: 01/30/2012 Discharge date: 02/03/2012  Recommendations for Outpatient Follow-up:  1. Followup with primary care physician in one week to followup response to ciprofloxacin for a UTI/dysuria and continue treatment of chronic comorbidities including diabetes. 2. Consider outpatient referral to general surgery for asymptomatic cholelithiasis. 3. Patient has scheduled followup with urology next month as noted below for UTI, dysuria, urinary retention, elevated PSA.  Discharge Diagnoses:  1. Developing, vomiting 2. UTI/dysuria 3. Resolved urinary retention 4. Elevated PSA 5. Constipation, resolved 6. Generalized weakness with history of falls prior to admission 7. Asymptomatic cholelithiasis 8. Diabetes mellitus type 2  Discharge Condition: Improved Disposition: Home  Diet recommendation: Heart healthy, diabetic diet  History of present illness:  78 year old man admitted with dysuria, abdominal pain, nausea, vomiting. Recently hospitalized for UTI and discharged on ciprofloxacin.  Hospital Course:  [name] [name] was admitted medical floor and treated with with antibiotics and supportive care. His vomiting resolved and been tolerating a diet. Constipation resolved with aggressive bowel regimen and may have been contributing to his presenting abdominal pain and vomiting. Repeat urine culture again shows sensitivity to ciprofloxacin and the patient has had symptomatic improvement on ciprofloxacin which has been continued at this time. His examination and clinical presentation did not suggest prostatitis because of his prolonged symptoms will treat for an extended period of time for his UTI. CT imaging showed no evidence of urinary obstruction or nephrolithiasis. He will followup with urology as an outpatient. His chronic comorbidities have been stable. These and other issues  as outlined below. 1. Abdominal pain, vomiting: Clinically resolved. Ultrasound revealed cholelithiasis. No further inpatient evaluation.  2. UTI/dysuria: Improving. Remains sensitive to ciprofloxacin. Prostate nontender; prostatitis doubted however will treat an additional 2 weeks given persistence of symptoms. CT abdomen/pelvis this admission shows interval passing of the right ureteral stone and resolving hydronephrosis. Continue Cipro. Refer to urology as an outpatient.  3. Urinary retention/BPH on Flomax: Stable. Followup with urology as an outpatient.  4. Elevated PSA: Followup with urology as an outpatient.  5. Constipation: Resolved. Bowel regimen.    6. Generalized weakness/falls prior to admission: No followup per physical therapy and occupational therapy.  7. Diabetes mellitus type 2: Well-controlled. Sliding-scale insulin. Metformin on discharge.  8. COPD: Stable.  9. Cholelithiasis: Asymptomatic. Consider followup with general surgery as an outpatient for consideration of cholecystectomy.  10. History of substance abuse (cocaine): No evidence of this this admission.  11. Cigarette smoker: Nicotine patch 21 mg. Recommend cessation.  12. Hyperlipidemia: Substitute Lipitor for Zocor because of drug-drug interaction between simvastatin and amlodipine.  Consultants:  Physical therapy: No followup recommended.   Occupational therapy: No followup recommended  Discharge Instructions  Discharge Orders    Future Orders Please Complete By Expires   Diet - low sodium heart healthy      Diet Carb Modified      Activity as tolerated - No restrictions        Medication List  As of 02/03/2012  2:55 PM   STOP taking these medications         simvastatin 40 MG tablet         TAKE these medications         amLODipine 5 MG tablet   Commonly known as: NORVASC   Take 5 mg by mouth daily.      atorvastatin 20 MG tablet   Commonly known as:  LIPITOR   Take 1 tablet (20 mg total) by  mouth daily at 6 PM.      ciprofloxacin 500 MG tablet   Commonly known as: CIPRO   Take 1 tablet (500 mg total) by mouth 2 (two) times daily.      DSS 100 MG Caps   Take 100 mg by mouth 2 (two) times daily.      Fluticasone-Salmeterol 100-50 MCG/DOSE Aepb   Commonly known as: ADVAIR   Inhale 1 puff into the lungs every 12 (twelve) hours.      glucose blood test strip   Use as instructed      metFORMIN 500 MG tablet   Commonly known as: GLUCOPHAGE   Take 500 mg by mouth 2 (two) times daily with a meal.      naproxen sodium 220 MG tablet   Commonly known as: ANAPROX   Take 440 mg by mouth 2 (two) times daily with a meal. For headaches      nicotine 21 mg/24hr patch   Commonly known as: NICODERM CQ - dosed in mg/24 hours   Place 1 patch onto the skin daily.      Tamsulosin HCl 0.4 MG Caps   Commonly known as: FLOMAX   Take 1 capsule (0.4 mg total) by mouth daily after breakfast.           Follow-up Information    Follow up with [name] Barre, MD in 1 week.   Contact information:   [address] [facility] [address] [place] [address] [phone]       Follow up with [name] Slough, MD on 03/06/2012. (10:15 AM)    Contact information:   [address] [place], [address] [facility] [place] [address] [phone]          The results of significant diagnostics from this hospitalization (including imaging, microbiology, ancillary and laboratory) are listed below for reference.    Significant Diagnostic Studies: Ct Abdomen Pelvis Wo Contrast  01/30/2012  *RADIOLOGY REPORT*  Clinical Data: Persistent right flank pain with chills.  Recent ureteral calculus.  CT ABDOMEN AND PELVIS WITHOUT CONTRAST  Technique:  Multidetector CT imaging of the abdomen and pelvis was performed following the standard protocol without intravenous contrast.  Comparison: CT 01/15/2012.  Findings: Recently  demonstrated right-sided hydronephrosis and hydroureter have improved.  There is improved perinephric soft tissue stranding.  The previously demonstrated distal right ureteral calculus is no longer visualized.  There is no evidence of bladder calculus.  No new calcifications are seen within the prostate gland to suggest a calculus within the prostatic urethra. There is a stable nonobstructing calculus in the upper pole of the right kidney. There is a tiny calculus in the mid-left kidney.  Cholelithiasis and aortoiliac atherosclerosis appears stable.  The liver, spleen, gallbladder, pancreas and adrenal glands otherwise appear unremarkable.  There are stable right renal cyst.  No inflammatory changes are evident.  There are stable postsurgical changes status post lower abdominal hernia repair.  A residual left inguinal hernia appears unchanged.  IMPRESSION:  1.  Interval passage of recently demonstrated distal right ureteral calculus.  No bladder calculus identified. 2.  Improved right-sided hydronephrosis and hydroureter. 3.  Stable nonobstructing bilateral renal calculi. 4.  Stable cholelithiasis and left inguinal hernia.  Original Report Authenticated By: Gerrianne Scale, M.D.   US Abdomen Complete  01/31/2012  *RADIOLOGY REPORT*  Clinical Data:  Abdominal pain.  Suprapubic pain.  COMPLETE ABDOMINAL ULTRASOUND  Comparison:  CT 01/30/2012  Findings:  Gallbladder:  Cholelithiasis is present.  There is a mobile gallstone in the gallbladder measuring 28 mm.  There is no wall thickening or pericholecystic fluid.  No sonographic Murphy's sign. Gallbladder wall measures 2 mm.  Common bile duct:  6 mm, within normal limits for age.  Liver:  No focal lesion identified.  Within normal limits in parenchymal echogenicity.  IVC:  Appears normal.  Pancreas:  No focal abnormality seen.  Spleen:  86 mm.  Normal echotexture.  Right Kidney:  11.2 cm.  Multiple cysts are present.  These appears simple.  The largest is in the  right upper renal pole measuring 22 mm.  Left Kidney:  10.7 cm.  There is an irregular contour in the interpolar region.  This probably represents a chronic renal scarring in the setting of a normal variant a dromedary hump.  The echotexture is similar to the adjacent renal cortex and parenchyma and no focal mass lesion is identified.  The appearance is congruent to prior CT.  The previously seen stone on CT is not identified.  Abdominal aorta:  Mild dilation of the distal abdominal aorta is noted.  This was better seen on prior CT.  IMPRESSION: 1.  Cholelithiasis without cholecystitis.  Largest stone measures 28 mm. 2.  Right renal simple cysts. 3.  Abdominal aortic atherosclerosis and mild dilation of the distal aorta better seen on prior CT.  Original Report Authenticated By: Andreas Newport, M.D.   Microbiology: Recent Results (from the past 240 hour(s))  CULTURE, BLOOD (ROUTINE X 2)     Status: Normal (Preliminary result)   Collection Time   01/30/12 11:20 AM      Component Value Range Status Comment   Specimen Description BLOOD LEFT ARM   Final    Special Requests BOTTLES DRAWN AEROBIC AND ANAEROBIC 10CC   Final    Culture  Setup Time [id number]   Final    Culture     Final    Value:        BLOOD CULTURE RECEIVED NO GROWTH TO DATE CULTURE WILL BE HELD FOR 5 DAYS BEFORE ISSUING A FINAL NEGATIVE REPORT   Report Status PENDING   Incomplete   CULTURE, BLOOD (ROUTINE X 2)     Status: Normal (Preliminary result)   Collection Time   01/30/12 11:30 AM      Component Value Range Status Comment   Specimen Description BLOOD RIGHT ARM   Final    Special Requests BOTTLES DRAWN AEROBIC AND ANAEROBIC 10CC   Final    Culture  Setup Time [id number]   Final    Culture     Final    Value:        BLOOD CULTURE RECEIVED NO GROWTH TO DATE CULTURE WILL BE HELD FOR 5 DAYS BEFORE ISSUING A FINAL NEGATIVE REPORT   Report Status PENDING   Incomplete   URINE CULTURE     Status: Normal   Collection Time    01/30/12 11:59 AM      Component Value Range Status Comment   Specimen Description URINE, RANDOM   Final    Special Requests NONE   Final    Culture  Setup Time [id number]   Final    Colony Count >=100,000 COLONIES/ML   Final    Culture ESCHERICHIA COLI   Final    Report Status 02/02/2012 FINAL   Final    Organism ID, Bacteria ESCHERICHIA COLI   Final  URINE CULTURE     Status: Normal   Collection Time   01/30/12  4:48 PM      Component Value Range Status Comment   Specimen Description URINE, RANDOM   Final    Special Requests ADDED 01/30/12 1722   Final    Culture  Setup Time [id number]   Final    Colony Count NO GROWTH   Final    Culture NO GROWTH   Final    Report Status 01/31/2012 FINAL   Final     Labs: Basic Metabolic Panel:  Lab 02/01/12 1308 01/31/12 0018 01/30/12 1703 01/30/12 1132  NA 138 134* -- 138  K 3.8 3.8 -- --  CL 101 99 -- 98  CO2 27 27 -- 28  GLUCOSE 105* 143* -- 85  BUN 12 20 -- 19  CREATININE 0.87 0.88 0.91 0.98  CALCIUM 9.1 8.5 -- 9.5  MG -- -- -- --  PHOS -- -- -- --   Liver Function Tests:  Lab 01/31/12 0018  AST 11  ALT 17  ALKPHOS 71  BILITOT 0.5  PROT 6.1  ALBUMIN 2.8*   CBC:  Lab 02/01/12 0555 01/31/12 0018 01/30/12 1703 01/30/12 1132  WBC 8.8 9.5 10.9* 13.3*  NEUTROABS -- -- -- --  HGB 14.4 13.7 14.8 16.0  HCT 42.5 41.3 44.5 46.1  MCV 90.2 90.8 90.4 90.0  PLT 223 214 230 230   Cardiac Enzymes:  Lab 01/31/12 0900 01/31/12 0017 01/30/12 1711  CKTOTAL 23 22 27   CKMB 1.3 1.3 1.3  CKMBINDEX -- -- --  TROPONINI <0.30 <0.30 <0.30   CBG:  Lab 02/03/12 0650 02/03/12 0100 02/02/12 1616 02/02/12 1132 02/02/12 0707  GLUCAP 96 90 85 85 98    Principal Problem:  *UTI (urinary tract infection) Active Problems:  Abdominal  pain, other specified site  DIABETES MELLITUS, TYPE II  NEPHROLITHIASIS, HX OF  Tobacco abuse  Nausea vomiting and diarrhea  Generalized weakness  Fall at home  Physical deconditioning   Time  coordinating discharge: 35 minutes.  Signed:  Brendia Sacks, MD [facility] 02/03/2012, 2:55 PM

## 2012-02-03 NOTE — Progress Notes (Signed)
Pt d/c to home. Pt was given bus pass by SW. Assessment stable, understands to pick up prescriptions at the pharmacy his medications were called into.

## 2012-02-03 NOTE — Progress Notes (Signed)
[facility] PROGRESS NOTE  [name] [name] YQM:[id number] DOB: [birth year] DOA: 01/30/2012 PCP: [name], MD, MD  Assessment/Plan: 1. Abdominal pain, vomiting: Clinically resolved. Ultrasound revealed cholelithiasis. No further inpatient evaluation. 2. UTI/dysuria: Improving. Remains sensitive to ciprofloxacin. Prostate nontender; prostatitis doubted however will treat an additional 2 weeks given persistence of symptoms. CT abdomen/pelvis this admission shows interval passing of the right ureteral stone and resolving hydronephrosis. Continue Cipro. Refer to urology as an outpatient. 3. Urinary retention/BPH on Flomax: Stable. Followup with urology as an outpatient. 4. Elevated PSA: Followup with urology as an outpatient. 5. Constipation: Resolved. Bowel regimen.  6. Generalized weakness/falls prior to admission: No followup per physical therapy and occupational therapy. 7. Diabetes mellitus type 2: Well-controlled. Sliding-scale insulin. Metformin on discharge. 8. COPD: Stable. 9. Cholelithiasis: Asymptomatic. Consider followup with general surgery as an outpatient for consideration of cholecystectomy. 10. History of substance abuse (cocaine): No evidence of this this admission. 11. Cigarette smoker: Nicotine patch 21 mg. Recommend cessation. 12. Hyperlipidemia: Substitute Lipitor for Zocor because of drug-drug interaction between simvastatin and amlodipine.  Code Status:  Full code Family Communication:  Disposition Plan: Home today.  Brendia Sacks, MD  [facility] Pager [phone]  If 8PM-8AM, please contact night-coverage [email or web address] Password [email or web address] 02/03/2012, 1:07 PM  LOS: 4 days   Brief narrative: 78 year old man admitted with dysuria, abdominal pain, nausea, vomiting. Recently hospitalized for UTI and discharged on ciprofloxacin.  Consultants:  Physical therapy: No followup recommended.  Occupational therapy: No followup  recommended  HPI/Subjective: Afebrile, vital signs stable. Reports resolution of constipation. Overall improving.  Objective: Filed Vitals:   02/02/12 1430 02/02/12 2004 02/02/12 2138 02/03/12 0618  BP: 98/60  107/69 99/61  Pulse: 89  86 89  Temp: 98.7 F (37.1 C)  98.7 F (37.1 C) 98.1 F (36.7 C)  TempSrc: Oral  Oral Oral  Resp: 18  20 20   Height:      Weight:      SpO2: 91% 94% 97% 94%    Intake/Output Summary (Last 24 hours) at 02/03/12 1307 Last data filed at 02/03/12 1200  Gross per 24 hour  Intake    720 ml  Output      0 ml  Net    720 ml    Exam:   General:  Appears calm and comfortable.  Cardiovascular: Regular rate and rhythm. No murmur, rub, gallop. No significant lower extremity edema.  Respiratory: Clear to auscultation bilaterally. No wheezes, rales, rhonchi. Normal respiratory effort  Data Reviewed: Basic Metabolic Panel:  Lab 02/01/12 4401 01/31/12 0018 01/30/12 1703 01/30/12 1132  NA 138 134* -- 138  K 3.8 3.8 -- --  CL 101 99 -- 98  CO2 27 27 -- 28  GLUCOSE 105* 143* -- 85  BUN 12 20 -- 19  CREATININE 0.87 0.88 0.91 0.98  CALCIUM 9.1 8.5 -- 9.5  MG -- -- -- --  PHOS -- -- -- --   Liver Function Tests:  Lab 01/31/12 0018  AST 11  ALT 17  ALKPHOS 71  BILITOT 0.5  PROT 6.1  ALBUMIN 2.8*   CBC:  Lab 02/01/12 0555 01/31/12 0018 01/30/12 1703 01/30/12 1132  WBC 8.8 9.5 10.9* 13.3*  NEUTROABS -- -- -- --  HGB 14.4 13.7 14.8 16.0  HCT 42.5 41.3 44.5 46.1  MCV 90.2 90.8 90.4 90.0  PLT 223 214 230 230   Cardiac Enzymes:  Lab 01/31/12 0900 01/31/12 0017 01/30/12 1711  CKTOTAL 23 22 27   CKMB 1.3 1.3  1.3  CKMBINDEX -- -- --  TROPONINI <0.30 <0.30 <0.30   CBG:  Lab 02/03/12 0650 02/03/12 0100 02/02/12 1616 02/02/12 1132 02/02/12 0707  GLUCAP 96 90 85 85 98    Recent Results (from the past 240 hour(s))  CULTURE, BLOOD (ROUTINE X 2)     Status: Normal (Preliminary result)   Collection Time   01/30/12 11:20 AM      Component  Value Range Status Comment   Specimen Description BLOOD LEFT ARM   Final    Special Requests BOTTLES DRAWN AEROBIC AND ANAEROBIC 10CC   Final    Culture  Setup Time [id number]   Final    Culture     Final    Value:        BLOOD CULTURE RECEIVED NO GROWTH TO DATE CULTURE WILL BE HELD FOR 5 DAYS BEFORE ISSUING A FINAL NEGATIVE REPORT   Report Status PENDING   Incomplete   CULTURE, BLOOD (ROUTINE X 2)     Status: Normal (Preliminary result)   Collection Time   01/30/12 11:30 AM      Component Value Range Status Comment   Specimen Description BLOOD RIGHT ARM   Final    Special Requests BOTTLES DRAWN AEROBIC AND ANAEROBIC 10CC   Final    Culture  Setup Time [id number]   Final    Culture     Final    Value:        BLOOD CULTURE RECEIVED NO GROWTH TO DATE CULTURE WILL BE HELD FOR 5 DAYS BEFORE ISSUING A FINAL NEGATIVE REPORT   Report Status PENDING   Incomplete   URINE CULTURE     Status: Normal   Collection Time   01/30/12 11:59 AM      Component Value Range Status Comment   Specimen Description URINE, RANDOM   Final    Special Requests NONE   Final    Culture  Setup Time [id number]   Final    Colony Count >=100,000 COLONIES/ML   Final    Culture ESCHERICHIA COLI   Final    Report Status 02/02/2012 FINAL   Final    Organism ID, Bacteria ESCHERICHIA COLI   Final   URINE CULTURE     Status: Normal   Collection Time   01/30/12  4:48 PM      Component Value Range Status Comment   Specimen Description URINE, RANDOM   Final    Special Requests ADDED 01/30/12 1722   Final    Culture  Setup Time [id number]   Final    Colony Count NO GROWTH   Final    Culture NO GROWTH   Final    Report Status 01/31/2012 FINAL   Final     Studies: Ct Abdomen Pelvis Wo Contrast  01/30/2012  *RADIOLOGY REPORT*  Clinical Data: Persistent right flank pain with chills.  Recent ureteral calculus.  CT ABDOMEN AND PELVIS WITHOUT CONTRAST  Technique:  Multidetector CT imaging of the abdomen and pelvis was  performed following the standard protocol without intravenous contrast.  Comparison: CT 01/15/2012.  Findings: Recently demonstrated right-sided hydronephrosis and hydroureter have improved.  There is improved perinephric soft tissue stranding.  The previously demonstrated distal right ureteral calculus is no longer visualized.  There is no evidence of bladder calculus.  No new calcifications are seen within the prostate gland to suggest a calculus within the prostatic urethra. There is a stable nonobstructing calculus in the upper pole of the right kidney. There is a tiny calculus  in the mid-left kidney.  Cholelithiasis and aortoiliac atherosclerosis appears stable.  The liver, spleen, gallbladder, pancreas and adrenal glands otherwise appear unremarkable.  There are stable right renal cyst.  No inflammatory changes are evident.  There are stable postsurgical changes status post lower abdominal hernia repair.  A residual left inguinal hernia appears unchanged.  IMPRESSION:  1.  Interval passage of recently demonstrated distal right ureteral calculus.  No bladder calculus identified. 2.  Improved right-sided hydronephrosis and hydroureter. 3.  Stable nonobstructing bilateral renal calculi. 4.  Stable cholelithiasis and left inguinal hernia.  Original Report Authenticated By: Gerrianne Scale, M.D.   US Abdomen Complete  01/31/2012  *RADIOLOGY REPORT*  Clinical Data:  Abdominal pain.  Suprapubic pain.  COMPLETE ABDOMINAL ULTRASOUND    IMPRESSION: 1.  Cholelithiasis without cholecystitis.  Largest stone measures 28 mm. 2.  Right renal simple cysts. 3.  Abdominal aortic atherosclerosis and mild dilation of the distal aorta better seen on prior CT.  Original Report Authenticated By: Andreas Newport, M.D.   Scheduled Meds:    . amLODipine  5 mg Oral Daily  . atorvastatin  20 mg Oral q1800  . ciprofloxacin  500 mg Oral BID  . docusate sodium  100 mg Oral BID  . Fluticasone-Salmeterol  1 puff Inhalation Q12H   . heparin  5,000 Units Subcutaneous Q8H  . insulin aspart  0-15 Units Subcutaneous TID WC  . magnesium citrate  1 Bottle Oral Once  . nicotine  21 mg Transdermal Daily  . pantoprazole  40 mg Oral Q0600  . polyethylene glycol  17 g Oral BID  . senna  2 tablet Oral Daily  . Tamsulosin HCl  0.4 mg Oral QPC breakfast   Principal Problem:  *UTI (urinary tract infection) Active Problems:  DIABETES MELLITUS, TYPE II  HYPERTENSION  COPD  NEPHROLITHIASIS, HX OF  Abdominal  pain, other specified site  Tobacco abuse  Nausea vomiting and diarrhea  Generalized weakness  Fall at home  Physical deconditioning  Continuous Infusions:

## 2012-02-05 LAB — CULTURE, BLOOD (ROUTINE X 2)
Culture  Setup Time: 201305132033
Culture: NO GROWTH

## 2012-02-06 LAB — CULTURE, BLOOD (ROUTINE X 2)
Culture  Setup Time: 201305140923
Culture: NO GROWTH

## 2012-02-06 LAB — GLUCOSE, CAPILLARY

## 2012-02-08 NOTE — Discharge Summary (Signed)
[name] [name]  MRN: [id number]  DOB/AGE: 04/26/1934 78 y.o.  Admit date: 01/15/2012  Discharge date: 01/19/2012  Primary Care Physician: [name], MD, MD  Discharge Diagnoses:  Patient Active Problem List   Diagnoses   .  DIABETES MELLITUS, TYPE II   .  HYPERLIPIDEMIA   .  ANXIETY   .  DEPRESSION   .  RESTLESS LEG SYNDROME   .  HYPERTENSION   .  EMPHYSEMA, BULLOUS   .  ASTHMA   .  COPD   .  GERD   .  PEPTIC ULCER DISEASE   .  DISC DISEASE, LUMBAR   .  SPINAL STENOSIS, LUMBAR   .  NEPHROLITHIASIS, HX OF   .  Orthostasis   .  Dehydration   .  Abdominal pain, other specified site   .  Dizziness   .  Weight loss   .  Left lumbar radiculopathy   .  Cocaine abuse   .  Tobacco abuse   .  Nausea vomiting and diarrhea   .  UTI (urinary tract infection)    DISCHARGE MEDICATION:  Medication List  As of 01/19/2012 11:09 AM    STOP taking these medications          amLODipine 5 MG tablet       TAKE these medications          ciprofloxacin 250 MG tablet      Commonly known as: CIPRO      Take 1 tablet (250 mg total) by mouth 2 (two) times daily.      citalopram 10 MG tablet      Commonly known as: CELEXA      Take 1 tablet (10 mg total) by mouth daily.      Fluticasone-Salmeterol 100-50 MCG/DOSE Aepb      Commonly known as: ADVAIR      Inhale 1 puff into the lungs every 12 (twelve) hours.      glucose blood test strip      Use as instructed      metFORMIN 500 MG (MOD) 24 hr tablet      Commonly known as: GLUMETZA      Take 2 tablets (1,000 mg total) by mouth daily.      omeprazole 20 MG capsule      Commonly known as: PRILOSEC      Take 1 capsule (20 mg total) by mouth daily.      simvastatin 40 MG tablet      Commonly known as: ZOCOR      Take 1 tablet (40 mg total) by mouth daily.      Tamsulosin HCl 0.4 MG Caps      Commonly known as: FLOMAX      Take 1 capsule (0.4 mg total) by mouth daily after supper.         SIGNIFICANT DIAGNOSTIC STUDIES:  Ct Abdomen Pelvis Wo  Contrast  01/15/2012 *RADIOLOGY REPORT* Clinical Data: Renal stone, generalized weakness, nausea, vomiting CT ABDOMEN AND PELVIS WITHOUT CONTRAST Technique: Multidetector CT imaging of the abdomen and pelvis was performed following the standard protocol without intravenous contrast. Comparison: CT abdomen pelvis - 09/12/2011 Findings: The lack of intravenous contrast limits the ability to evaluate solid abdominal organs. Normal hepatic contour. Cholelithiasis without evidence of cholecystitis on this noncontrast examination. No ascites. There is approximately 3 mm stone within the distal aspect the right ureter (image 63, series 2) which results in mild upstream ureterectasis and pelvicaliectasis and slightly  asymmetric right- sided perinephric stranding. There is an additional approximately 3 mm nonobstructing stone within the superior calix of the right kidney (image 26). Ill-defined hypoattenuating lesion in the superior pole right kidney correlates with the previously characterized right-sided renal cyst. There is unchanged mild left- sided likely age related perinephric stranding. No left-sided urinary obstruction. The noncontrast appearance of the bilateral adrenal glands, pancreas and spleen is normal. Interval right-sided left-sided inguinal hernia repair with minimal amount of fluid seen in the distal aspect of the inguinal canal. Colonic diverticulosis without evidence of diverticulitis on noncontrast examination. Bowel is otherwise normal in course and caliber without wall thickening or evidence of obstruction. The cecum is again noted to be located within the right upper abdominal quadrant. The bowel is otherwise normal in course and caliber without wall thickening or evidence of obstruction. Normal appendix. No pneumoperitoneum, pneumatosis or portal venous gas. There is grossly unchanged mild aneurysmal dilatation of the infrarenal abdominal aorta, measuring approximately 2.9 x 3.2 cm in greatest  transverse axial dimension (image 38, series 2). Minimal increase in right common iliac lymph node, measuring 8 mm in short axis diameter (image 45, series 2), previously, 6 mm, presumably reactive in etiology and is not enlarged by CT criteria. Additional shoddy retroperitoneal, mesenteric, pelvic and inguinal lymph nodes are not enlarged by CT criteria. The prostate remains enlarged with mass effect upon the under surface of the bladder. No free fluid within the pelvis. Limited visualization of the lower thorax demonstrates unchanged moderate paraseptal emphysema. No definite focal airspace opacity or pleural effusion. Normal heart size. No pericardial effusion. No acute or aggressive osseous abnormalities. Redemonstrated moderate multilevel DDD, worst at L4 and L5 with mild (2 to 3 mm) retrolisthesis at this level. IMPRESSION: 1. Approximately 3 mm stone within distal aspect the right ureter results in mild upstream ureterectasis and pelvicaliectasis. 2. Additional nonobstructing right-sided renal stone. 3. Unchanged small infrarenal abdominal aortic aneurysm measuring approximately 3.2 cm in greatest transverse axial dimension. 4. Colonic diverticulosis without evidence of diverticulitis. 5. Cholelithiasis without evidence of cholecystitis. 6. Post left-sided inguinal hernia repair. No evidence of enteric obstruction. Original Report Authenticated By: Waynard Reeds, M.D.  Dg Chest 2 View  01/17/2012 *RADIOLOGY REPORT* Clinical Data: Weakness. Shortness of breath. Lower abdominal pain. Rule out pneumonia. History of diabetes, COPD. Ex-smoker. CHEST - 2 VIEW Comparison: Numerous priors, most recent 01/06/2012 Findings: Hyperinflation is identified on the lateral views. Lateral view degraded by patient arm position. AP frontal view with apical lordotic positioning. Tracheal deviation to the right. Moderate cardiomegaly. There is focal hyperaeration within the left upper lobe, with displacement of the left  mediastinal contour into the right-sided chest. This is unchanged. The left costophrenic angle is excluded from the frontal film. Given this factor, no pleural fluid. No pneumothorax. Lower lobe predominant interstitial thickening. Volume loss at the medial right lung base, with obscuration of the medial right hemidiaphragm. This is similar over numerous prior exams, including back to 11/30/2008. IMPRESSION: 1. COPD/emphysema. Chronic scarring. No acute superimposed process. 2. Cardiomegaly without congestive failure. 3. Mildly degraded by patient positioning. Original Report Authenticated By: Consuello Bossier, M.D.  Dg Chest 2 View  01/06/2012 *RADIOLOGY REPORT* Clinical Data: Dizziness and weakness. CHEST - 2 VIEW Comparison: Two-view chest 04/23/2011. Findings: Severe emphysematous changes are evident. Mild interstitial coarsening is chronic. No focal airspace disease is evident. Extension of the left lung across midline is stable. IMPRESSION: 1. Stable appearance of severe emphysema and chronic interstitial coarsening. 2. No acute  cardiopulmonary disease. Original Report Authenticated By: Jamesetta Orleans. MATTERN, M.D.  Dg Abd Acute W/chest  01/15/2012 *RADIOLOGY REPORT* Clinical Data: Shortness of breath. Nausea vomiting and diarrhea. ACUTE ABDOMEN SERIES (ABDOMEN 2 VIEW & CHEST 1 VIEW) Comparison: 09/12/2011 Findings: The heart size appears normal. No pleural effusion or edema identified. Chronic interstitial coarsening is noted bilaterally. No focal airspace consolidation. No dilated loops of small bowel identified. There is a single dilated loop of small bowel within the left upper quadrant of the abdomen. This measures up to 4.2 cm. Gas and stool noted within the colon up to the rectum. IMPRESSION: 1. Nonspecific bowel gas pattern. 2. Severe emphysema and chronic interstitial coarsening/scarring. Original Report Authenticated By: Rosealee Albee, M.D.  Recent Results (from the past 240 hour(s))   URINE  CULTURE Status: Normal    Collection Time    01/15/12 1:18 PM   Component  Value  Range  Status  Comment    Specimen Description  URINE, CLEAN CATCH   Final     Special Requests  NONE   Final     Culture Setup Time  [id number]   Final     Colony Count  >=100,000 COLONIES/ML   Final     Culture  ESCHERICHIA COLI   Final     Report Status  01/17/2012 FINAL   Final     Organism ID, Bacteria  ESCHERICHIA COLI   Final    MRSA PCR SCREENING Status: Normal    Collection Time    01/15/12 3:50 PM   Component  Value  Range  Status  Comment    MRSA by PCR  NEGATIVE  NEGATIVE  Final     BRIEF ADMITTING H & P:  78 year old male with a history of type 2 diabetes, COPD who presents to the ER with a chief complaint of generalized weakness, nausea, vomiting, diarrhea since Wednesday. His symptoms started a week and a half ago. He presented with similar symptoms on April 19, to the ER. The patient was found to have some microscopic hematuria, mild UTI, he was hydrated with IV fluids, was given a referral to follow up with urology outpatient and discharge from the ER.  He presents today with increasing urinary frequency, dysuria, low-grade fever, myalgias. He found her urinary tract infection. He complains of decrease in appetite. He also complains of inability to take care of himself at home. He denies any hematemesis, hematochezia, melena. He denies any chest pain but does complain of exertional shortness of breath for the last one week.  Hospital Course:  Present on Admission:  .E.Coli UTI: Pt was admitted with UTI. He was started empirically on Rocephin. Cultures revealed E. Coli which was sensitive to Rocephin. Pt was received rocephin for 3 days and will be discharged with ciprofloxacin to complete a total of 7 days.   .Nausea vomiting and diarrhea: Felt to be secondary to UTI.   Marland KitchenDIABETES MELLITUS, TYPE II: BS controlled. Will continue on Metformin.   Marland KitchenCOPD: Quiscient   Disposition and Follow-up:    Follow up with Dr. Jonny Ruiz in 1 week.  DISCHARGE EXAM:  General: Alert, awake, oriented x3, in no acute distress.  Vital Signs: Blood pressure 131/71, pulse 89, temperature 97.7 F (36.5 C), temperature source Oral, resp. rate 19, height 5\' 10"  (1.778 m), weight 76.658 kg (169 lb), SpO2 93.00%.  HEENT: [place]/AT PEERL, EOMI  Neck: Trachea midline, no masses, no thyromegal,y no JVD, no carotid bruit  OROPHARYNX: Moist, No exudate/ erythema/lesions.  Heart: Regular  rate and rhythm, without murmurs, rubs, gallops, PMI non-displaced, no heaves or thrills on palpation.  Lungs: Clear to auscultation, no wheezing or rhonchi noted. No increased vocal fremitus resonant to percussion  Abdomen: Soft, nontender, nondistended, positive bowel sounds, no masses no hepatosplenomegaly noted..  Neuro: No focal neurological deficits noted cranial nerves II through XII grossly intact. DTRs 2+ bilaterally upper and lower extremities. Strength functional in bilateral upper and lower extremities.  Musculoskeletal: No warm swelling or erythema around joints, no spinal tenderness noted.   Total time spent in discharge process approximately 40 minutes.  Signed:  Jason Frisbee A.  01/19/2012, 11:09 AM

## 2012-03-01 ENCOUNTER — Telehealth: Payer: Self-pay | Admitting: Internal Medicine

## 2012-03-01 ENCOUNTER — Encounter (HOSPITAL_COMMUNITY): Payer: Self-pay | Admitting: *Deleted

## 2012-03-01 ENCOUNTER — Emergency Department (HOSPITAL_COMMUNITY)
Admission: EM | Admit: 2012-03-01 | Discharge: 2012-03-02 | Disposition: A | Discharge status: Home / Self Care | Payer: Medicare (Managed Care) | Attending: Emergency Medicine | Admitting: Emergency Medicine

## 2012-03-01 DIAGNOSIS — N390 Urinary tract infection, site not specified: Secondary | ICD-10-CM

## 2012-03-01 DIAGNOSIS — E785 Hyperlipidemia, unspecified: Secondary | ICD-10-CM | POA: Insufficient documentation

## 2012-03-01 DIAGNOSIS — J4489 Other specified chronic obstructive pulmonary disease: Secondary | ICD-10-CM | POA: Insufficient documentation

## 2012-03-01 DIAGNOSIS — J449 Chronic obstructive pulmonary disease, unspecified: Secondary | ICD-10-CM | POA: Insufficient documentation

## 2012-03-01 DIAGNOSIS — E119 Type 2 diabetes mellitus without complications: Secondary | ICD-10-CM | POA: Insufficient documentation

## 2012-03-01 DIAGNOSIS — F3289 Other specified depressive episodes: Secondary | ICD-10-CM | POA: Insufficient documentation

## 2012-03-01 DIAGNOSIS — F411 Generalized anxiety disorder: Secondary | ICD-10-CM | POA: Insufficient documentation

## 2012-03-01 DIAGNOSIS — F322 Major depressive disorder, single episode, severe without psychotic features: Secondary | ICD-10-CM

## 2012-03-01 DIAGNOSIS — R45851 Suicidal ideations: Secondary | ICD-10-CM

## 2012-03-01 DIAGNOSIS — F17200 Nicotine dependence, unspecified, uncomplicated: Secondary | ICD-10-CM | POA: Insufficient documentation

## 2012-03-01 DIAGNOSIS — Z79899 Other long term (current) drug therapy: Secondary | ICD-10-CM | POA: Insufficient documentation

## 2012-03-01 DIAGNOSIS — E876 Hypokalemia: Secondary | ICD-10-CM | POA: Insufficient documentation

## 2012-03-01 DIAGNOSIS — F329 Major depressive disorder, single episode, unspecified: Secondary | ICD-10-CM | POA: Insufficient documentation

## 2012-03-01 DIAGNOSIS — G2581 Restless legs syndrome: Secondary | ICD-10-CM | POA: Insufficient documentation

## 2012-03-01 DIAGNOSIS — I10 Essential (primary) hypertension: Secondary | ICD-10-CM | POA: Insufficient documentation

## 2012-03-01 LAB — COMPREHENSIVE METABOLIC PANEL
ALT: 11 U/L (ref 0–53)
AST: 9 U/L (ref 0–37)
Albumin: 3.5 g/dL (ref 3.5–5.2)
Alkaline Phosphatase: 93 U/L (ref 39–117)
BUN: 9 mg/dL (ref 6–23)
CO2: 27 mEq/L (ref 19–32)
Calcium: 9.6 mg/dL (ref 8.4–10.5)
Chloride: 103 mEq/L (ref 96–112)
Creatinine, Ser: 0.93 mg/dL (ref 0.50–1.35)
GFR calc Af Amer: >90 mL/min (ref >90)
GFR calc non Af Amer: 80 mL/min — ABNORMAL LOW (ref >90)
Glucose, Bld: 102 mg/dL — ABNORMAL HIGH (ref 70–99)
Potassium: 3.1 mEq/L — ABNORMAL LOW (ref 3.5–5.1)
Sodium: 141 mEq/L (ref 135–145)
Total Bilirubin: 0.7 mg/dL (ref 0.3–1.2)
Total Protein: 7.7 g/dL (ref 6.0–8.3)

## 2012-03-01 LAB — CBC
HCT: 46.5 % (ref 39.0–52.0)
Hemoglobin: 15.8 g/dL (ref 13.0–17.0)
MCH: 30.3 pg (ref 26.0–34.0)
MCHC: 34 g/dL (ref 30.0–36.0)
MCV: 89.1 fL (ref 78.0–100.0)
Platelets: 300 10*3/uL (ref 150–400)
RBC: 5.22 MIL/uL (ref 4.22–5.81)
RDW: 14.1 % (ref 11.5–15.5)
WBC: 11.4 10*3/uL — ABNORMAL HIGH (ref 4.0–10.5)

## 2012-03-01 LAB — GLUCOSE, CAPILLARY: Glucose-Capillary: 98 mg/dL (ref 70–99)

## 2012-03-01 LAB — DIFFERENTIAL
Basophils Absolute: 0.1 10*3/uL (ref 0.0–0.1)
Basophils Relative: 0 % (ref 0–1)
Eosinophils Absolute: 0.2 10*3/uL (ref 0.0–0.7)
Eosinophils Relative: 2 % (ref 0–5)
Lymphocytes Relative: 17 % (ref 12–46)
Lymphs Abs: 2 10*3/uL (ref 0.7–4.0)
Monocytes Absolute: 0.6 10*3/uL (ref 0.1–1.0)
Monocytes Relative: 5 % (ref 3–12)
Neutro Abs: 8.5 10*3/uL — ABNORMAL HIGH (ref 1.7–7.7)
Neutrophils Relative %: 75 % (ref 43–77)

## 2012-03-01 LAB — URINALYSIS, ROUTINE W REFLEX MICROSCOPIC
Bilirubin Urine: NEGATIVE
Glucose, UA: NEGATIVE mg/dL
Hgb urine dipstick: NEGATIVE
Ketones, ur: NEGATIVE mg/dL
Nitrite: NEGATIVE
Protein, ur: NEGATIVE mg/dL
Specific Gravity, Urine: 1.02 (ref 1.005–1.030)
Urobilinogen, UA: 0.2 mg/dL (ref 0.0–1.0)
pH: 6 (ref 5.0–8.0)

## 2012-03-01 LAB — URINE MICROSCOPIC-ADD ON

## 2012-03-01 LAB — RAPID URINE DRUG SCREEN, HOSP PERFORMED
Amphetamines: NOT DETECTED
Barbiturates: NOT DETECTED
Benzodiazepines: NOT DETECTED
Cocaine: POSITIVE — AB
Opiates: NOT DETECTED
Tetrahydrocannabinol: NOT DETECTED

## 2012-03-01 LAB — ETHANOL: Alcohol, Ethyl (B): <11 mg/dL (ref 0–11)

## 2012-03-01 MED ORDER — POTASSIUM CHLORIDE CRYS ER 20 MEQ PO TBCR
40.0000 meq | EXTENDED_RELEASE_TABLET | Freq: Once | ORAL | Status: AC
  Administered 2012-03-01: 40 meq via ORAL
  Filled 2012-03-01: qty 2

## 2012-03-01 NOTE — ED Notes (Signed)
Wife died 3 years ago, married 53 years. 2 children.  Denies ETOH. Cocaine use started at age 78, a year ago. Helps him to forget his problems, but states it does not do anything for him.  Denies depression and mental health prior to wife's death.  Pt tearful and crying since arrival. Tissues and warm blanket provided.
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
## 2012-03-01 NOTE — ED Notes (Signed)
Pt states "I have 9 children, moved to 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

## 2012-03-01 NOTE — ED Notes (Signed)
Pain when urinating. States UTI for a awhile now.

## 2012-03-01 NOTE — ED Notes (Signed)
Bed:WA29<BR> Expected date:<BR> Expected time:<BR> Means of arrival:<BR> Comments:<BR> Closed

## 2012-03-01 NOTE — ED Notes (Signed)
Patient was watched by sitter while undressing and changed into blue scrubs. Patient has one belonging bag. Patients belongings searched and pt wanded by security.

## 2012-03-01 NOTE — Telephone Encounter (Signed)
Received call for [name] [name] in [facility] that [name] was requesting a call back from me. She further stated that the patient appeared to be crying. I called [name] [name] back at [phone]. Patient was tearful and stated that he felt like  "ending it", and that he just wanted to die. He stated that he was feeling depressed and didn't think that he could continue in this manner.  He stated that he needed help and didn't know what to do. However since I had taken care of him a month and a half ago and I knew him from when his wife was ill, he felt that he could call me for help.  I contacted [employer] and asked them to take [name] [name] who was presently at [facility] in [address] in [place]. I remained on the phone until the police arrived and I asked the officer to take [name] [name] to [facility] ER.  I also spoke with the lead nurse [facility] at [facility] and made her aware of [name] [name] anticipated arrival to the ED.  Reynol Arnone A.

## 2012-03-01 NOTE — ED Notes (Signed)
Telepsyche request faxed to [facility].

## 2012-03-02 MED ORDER — CEPHALEXIN 500 MG PO CAPS: 500.0000 mg | ORAL_CAPSULE | Freq: Four times a day (QID) | ORAL | Status: AC

## 2012-03-02 MED ORDER — ALPRAZOLAM 0.5 MG PO TABS: 0.5000 mg | ORAL_TABLET | Freq: Four times a day (QID) | ORAL | Status: DC | PRN

## 2012-03-02 MED ORDER — GABAPENTIN 300 MG PO CAPS: 300.0000 mg | ORAL_CAPSULE | Freq: Four times a day (QID) | ORAL | Status: DC

## 2012-03-02 MED ORDER — CEPHALEXIN 500 MG PO CAPS
500.0000 mg | ORAL_CAPSULE | Freq: Four times a day (QID) | ORAL | Status: DC
  Administered 2012-03-02 (×2): 500 mg via ORAL
  Filled 2012-03-02 (×2): qty 1

## 2012-03-02 MED ORDER — MIRTAZAPINE 30 MG PO TABS
15.0000 mg | ORAL_TABLET | Freq: Every day | ORAL | Status: DC
  Administered 2012-03-02: 15 mg via ORAL
  Filled 2012-03-02: qty 1

## 2012-03-02 MED ORDER — DIVALPROEX SODIUM 500 MG PO DR TAB: 500.0000 mg | DELAYED_RELEASE_TABLET | Freq: Two times a day (BID) | ORAL | Status: DC

## 2012-03-02 MED ORDER — FLUTICASONE-SALMETEROL 100-50 MCG/DOSE IN AEPB
1.0000 | INHALATION_SPRAY | Freq: Two times a day (BID) | RESPIRATORY_TRACT | Status: DC
  Administered 2012-03-02: 1 via RESPIRATORY_TRACT
  Filled 2012-03-02 (×2): qty 14

## 2012-03-02 MED ORDER — FLUOXETINE HCL 20 MG/5ML PO SOLN: 40.0000 mg | Freq: Every day | ORAL | Status: DC

## 2012-03-02 NOTE — ED Notes (Signed)
Tele Psych in progress 

## 2012-03-02 NOTE — ED Provider Notes (Addendum)
Filed Vitals:   03/02/12 0537  BP: 123/71  Pulse: 87  Temp: 97.4 F (36.3 C)  Resp: 18  Patient is resting comfortably. Remains stable overnight without any complaints. Awaiting formal disposition from psychiatry.  Labs reviewed.  Orders reviewed and appropriate    Dayton Bailiff, MD 03/02/12 0725  Patient evaluated by the ACT team. He denies suicidal ideation. He has contracted for safety. Provided outpatient resources. I spoke with the patient at length and he is still in denial of suicidal thoughts. He will be discharged home with outpatient psychiatry resources. Provided strict return precautions   Dayton Bailiff, MD 03/02/12 1255

## 2012-03-02 NOTE — BH Assessment (Signed)
Assessment Note   [name] [name] is an 78 y.o. male. Pt presents with to ED with C/O depression. Pt reports that he contacted one of his deceased wife's doctors and made a statement "Sometimes i wish i would die". Pt empathetically states that he wish he never made the statement and is adamant that he is not and was never suicidal. Pt reports having a really hard time coping with the death of his wife of 53 years.Pt reports that his wife died 3 yrs ago and has been sad and depressed since her death. Pt reports yesterday that he was having a hard time emotionally,as he thought about his wife. Pt reports that he currently lives at the [facility]. Pt reports that he and his wife rented a home from a friend but pt sts that he could not live in the house after his wife's death because of the memories. Pt denied substance use but ed notes indicate cocaine use in the past.Pt denies any changes with sleep or appetite. Pt does report that he has a lack of motivation with doing things like fishing because his vehicle has problems and he does not have access to reliable transportation and has to catch the city bus to get around. Pt has adult children who live in [place]. Pt reports that he visited his children in [place] several months ago but states that he does not want to live in [facility] because he does not like it. Pt denies SI,HI, and no avh reported. Outpatient therapy and psychiatry recommended and resources provided for follow-up. Pt motivated to seek mental health outpatient services to help him adjust and enjoy his life. Pt tele-psych report recommended inpatient treatment however, Dr. Brooke Dare EDP was consulted and feels that pt is stable and does not require inpatient hospitalization at this time. Pt agreeable with plan to follow-up with outpatient provider and is able to contract for safety.  Axis I: Bereavement,Depressive Disorder NOS Axis II: Deferred Axis III:  Past Medical History  Diagnosis Date  . DIABETES  MELLITUS, TYPE II 11/03/2009  . HYPERLIPIDEMIA 11/03/2009  . ANXIETY 11/03/2009  . DEPRESSION 11/03/2009  . RESTLESS LEG SYNDROME 11/03/2009  . HYPERTENSION 11/03/2009  . CHRONIC OBSTRUCTIVE PULMONARY DISEASE, ACUTE EXACERBATION 11/03/2009  . EMPHYSEMA, BULLOUS 11/03/2009  . ASTHMA 11/03/2009  . COPD 11/03/2009  . GERD 11/03/2009  . PEPTIC ULCER DISEASE 11/03/2009  . ABSCESS, FINGER 04/07/2010  . ABSCESS 12/03/2009  . DISC DISEASE, LUMBAR 11/03/2009  . SPINAL STENOSIS, LUMBAR 11/03/2009  . RASH-NONVESICULAR 11/03/2009  . Kidney stones 01/30/12    "I've had them 7 times; always have passed them"  . Shortness of breath     "sometimes; at any time"   Axis IV: other psychosocial or environmental problems, problems related to social environment and problems with primary support group Axis V: 51-60 moderate symptoms  Past Medical History:  Past Medical History  Diagnosis Date  . DIABETES MELLITUS, TYPE II 11/03/2009  . HYPERLIPIDEMIA 11/03/2009  . ANXIETY 11/03/2009  . DEPRESSION 11/03/2009  . RESTLESS LEG SYNDROME 11/03/2009  . HYPERTENSION 11/03/2009  . CHRONIC OBSTRUCTIVE PULMONARY DISEASE, ACUTE EXACERBATION 11/03/2009  . EMPHYSEMA, BULLOUS 11/03/2009  . ASTHMA 11/03/2009  . COPD 11/03/2009  . GERD 11/03/2009  . PEPTIC ULCER DISEASE 11/03/2009  . ABSCESS, FINGER 04/07/2010  . ABSCESS 12/03/2009  . DISC DISEASE, LUMBAR 11/03/2009  . SPINAL STENOSIS, LUMBAR 11/03/2009  . RASH-NONVESICULAR 11/03/2009  . Kidney stones 01/30/12    "I've had them 7 times; always have passed  them"  . Shortness of breath     "sometimes; at any time"    Past Surgical History  Procedure Date  . Rotator cuff repair 2003    left  . Tonsillectomy 1960  . Foot neuroma surgery 1982    left  . Inguinal hernia repair 10/2011    left    Family History:  Family History  Problem Relation Age of Onset  . Heart disease Mother   . Cancer Brother     lung    Social History:  reports that he has been smoking Cigarettes.  He  has a 41 pack-year smoking history. He has never used smokeless tobacco. He reports that he drinks alcohol. He reports that he does not use illicit drugs.  Additional Social History:  Alcohol / Drug Use Pain Medications:  (None Reported) Over the Counter:  Kingsley Callander for occasional headaches) History of alcohol / drug use?: No history of alcohol / drug abuse (Pt denies hx, ed notes indicate cocaine use)  CIWA: CIWA-Ar BP: 112/60 mmHg Pulse Rate: 89  COWS:    Allergies: No Known Allergies  Home Medications:  (Not in a hospital admission)  OB/GYN Status:  No LMP for male patient.  General Assessment Data Location of Assessment: [facility] ED ACT Assessment: Yes Living Arrangements: Alone Can pt return to current living arrangement?: Yes Admission Status: Voluntary Is patient capable of signing voluntary admission?: Yes Transfer from: Other (Comment) Referral Source: Other (Pt reports his deceased wifes doctor referred him)     Risk to self Suicidal Ideation: No Suicidal Intent: No Is patient at risk for suicide?: No Suicidal Plan?: No Access to Means: No What has been your use of drugs/alcohol within the last 12 months?: denies -ED notes report cocaine use in the past  Previous Attempts/Gestures: No How many times?: 0  Other Self Harm Risks: na Triggers for Past Attempts: None known Intentional Self Injurious Behavior: None Family Suicide History: No Recent stressful life event(s): Loss (Comment) (Wife died 3 yrs ago, no close family support,Lonely) Persecutory voices/beliefs?: No Depression: Yes Depression Symptoms: Loss of interest in usual pleasures;Feeling angry/irritable Substance abuse history and/or treatment for substance abuse?: Yes (denied during assessment bur ed notes indicate cocaine use) Suicide prevention information given to non-admitted patients: Yes  Risk to Others Homicidal Ideation: No Thoughts of Harm to Others: No Current Homicidal Intent: No Current  Homicidal Plan: No Access to Homicidal Means: No Identified Victim: na History of harm to others?: No Assessment of Violence: None Noted Violent Behavior Description: none noted Does patient have access to weapons?: No Criminal Charges Pending?: No Does patient have a court date: No  Psychosis Hallucinations: None noted Delusions: None noted  Mental Status Report Appear/Hygiene: Other (Comment) (Pt dressed in hospital scrubs) Eye Contact: Fair Motor Activity: Freedom of movement (lying in bed,observed pt get up and use bathroom with no iss) Speech: Logical/coherent Level of Consciousness: Alert;Quiet/awake Mood: Depressed Affect: Appropriate to circumstance;Depressed (flat) Anxiety Level: Minimal Thought Processes: Coherent;Relevant Judgement: Unimpaired Orientation: Person;Place;Time;Situation Obsessive Compulsive Thoughts/Behaviors: None  Cognitive Functioning Concentration: Normal Memory: Recent Intact;Remote Intact IQ: Average Insight: Fair Impulse Control: Poor Appetite: Good Sleep: No Change Total Hours of Sleep: 6  Vegetative Symptoms: None  ADLScreening [facility] Assessment Services) Patient's cognitive ability adequate to safely complete daily activities?: Yes Patient able to express need for assistance with ADLs?: Yes Independently performs ADLs?: Yes  Abuse/Neglect [facility]) Physical Abuse: Denies Verbal Abuse: Denies Sexual Abuse: Denies  Prior Inpatient Therapy Prior Inpatient Therapy: No Prior  Therapy Dates: na Prior Therapy Facilty/Provider(s): na Reason for Treatment: na  Prior Outpatient Therapy Prior Outpatient Therapy: No Prior Therapy Dates: na Prior Therapy Facilty/Provider(s): na Reason for Treatment: na  ADL Screening (condition at time of admission) Patient's cognitive ability adequate to safely complete daily activities?: Yes Patient able to express need for assistance with ADLs?: Yes Independently performs ADLs?: Yes  Home Assistive  Devices/Equipment Home Assistive Devices/Equipment: None    Abuse/Neglect Assessment (Assessment to be complete while patient is alone) Physical Abuse: Denies Verbal Abuse: Denies Sexual Abuse: Denies Exploitation of patient/patient's resources: Denies Self-Neglect: Denies Values / Beliefs Cultural Requests During Hospitalization: None Spiritual Requests During Hospitalization: None   Advance Directives (For Healthcare) Advance Directive: Patient would not like information Nutrition Screen Diet: Regular Problems chewing or swallowing foods and/or liquids: No Home Tube Feeding or Total Parenteral Nutrition (TPN): No Patient appears severely malnourished: No  Additional Information 1:1 In Past 12 Months?: No CIRT Risk: No Elopement Risk: No Does patient have medical clearance?: Yes     Disposition:  Disposition Disposition of Patient: Outpatient treatment (EDP Dr.King discharged home with opt referrals) Type of outpatient treatment: Adult (Outpt referrals provided)  On Site Evaluation by:   Reviewed with Physician:     Bjorn Pippin 03/02/2012 2:41 PM

## 2012-03-02 NOTE — ED Notes (Signed)
Pt states that he has no way home. He has no family in-state per his report. He states that the bus route is one block away from his house and he generally walks 3 blocks a day. He is requesting a bus pass to get home. SW notified for bus pass.

## 2012-03-02 NOTE — ED Provider Notes (Signed)
Patient evaluated by Dr. Jacky Kindle of telepsychiatry. He recommends inpatient treatment. Psychiatric medication orders entered at his request.  Hanley Seamen, MD 03/02/12 [phone]

## 2012-03-02 NOTE — Discharge Instructions (Signed)
Depression, Adolescent and Adult Depression is a true and treatable medical condition. In general there are two kinds of depression:  Depression we all experience in some form. For example depression from the death of a loved one, financial distress or natural disasters will trigger or increase depression.   Clinical depression, on the other hand, appears without an apparent cause or reason. This depression is a disease. Depression may be caused by chemical imbalance in the body and brain or may come as a response to a physical illness. Alcohol and other drugs can cause depression.  DIAGNOSIS  The diagnosis of depression is usually based upon symptoms and medical history. TREATMENT  Treatments for depression fall into three categories. These are:  Drug therapy. There are many medicines that treat depression. Responses may vary and sometimes trial and error is necessary to determine the best medicines and dosage for a particular patient.   Psychotherapy, also called talking treatments, helps people resolve their problems by looking at them from a different point of view and by giving people insight into their own personal makeup. Traditional psychotherapy looks at a childhood source of a problem. Other psychotherapy will look at current conflicts and move toward solving those. If the cause of depression is drug use, counseling is available to help abstain. In time the depression will usually improve. If there were underlying causes for the chemical use, they can be addressed.   ECT (electroconvulsive therapy) or shock treatment is not as commonly used today. It is a very effective treatment for severe suicidal depression. During ECT electrical impulses are applied to the head. These impulses cause a generalized seizure. It can be effective but causes a loss of memory for recent events. Sometimes this loss of memory may include the last several months.  Treat all depression or suicide threats as  serious. Obtain professional help. Do not wait to see if serious depression will get better over time without help. Seek help for yourself or those around you. In the [place] the number to the National Suicide Help Lines With 24 Hour Help Are: 1-800-SUICIDE 401-386-5317 Document Released: 09/02/2000 Document Revised: 08/25/2011 Document Reviewed: 04/23/2008 [facility] Patient Information 2012 [place], [place].  No-harm Safety Contract  A no-harm safety contract is a written or verbal agreement between you and a mental health professional to promote safety. It contains specific actions and promises you agree to. The agreement also includes instructions from the therapist or doctor. The instructions will help prevent you from harming yourself or harming others. Harm can be as mild as pinching yourself, but can increase in intensity to actions like burning or cutting yourself. The extreme level of self-harm would be committing suicide. No-harm safety contracts are also sometimes referred to as a Charity fundraiser, suicide Financial controller, no-harm agreements or decisions, or a Engineer, manufacturing systems.  REASONS FOR NO-HARM SAFETY CONTRACTS Safety contracts are just one part of an overall treatment plan to help keep you safe and free of harm. A safety contract may help to relieve anxiety, restore a sense of control, state clearly the alternatives to harm or suicide, and give you and your therapist or doctor a gauge for how you are doing in between visits. Many factors impact the decision to use a no-harm safety contract and its effectiveness. A proper overall treatment plan and evaluation and good patient understanding are the keys to good outcomes. CONTRACT ELEMENTS  A contract can range from simple to complex. They include all or some of the following:  Action statements.  These are statements you agree to do or not do. Example: If I feel my life is becoming too difficult, I agree to do the following so there is  no harm to myself or others:  Talk with family or friends.   Rid myself of all things that I could use to harm myself.   Do an activity I enjoy or have enjoyed in the recent past.  Coping strategies. These are ways to think and feel that decrease stress, such as:  Use of affirmations or positive statements about self.   Good self-care, including improved grooming, and healthy eating, and healthy sleeping patterns.   Increase physical exercise.   Increase social involvement.   Focus on positive aspects of life.  Crisis management. This would include what to do if there was trouble following the contract or an urge to harm. This might include notifying family or your therapist of suicidal thoughts. Be open and honest about suicidal urges. To prevent a crisis, do the following:  List reasons to reach out for support.   Keep contact numbers and available hours handy.  Treatment goals. These are goals would include no suicidal thoughts, improved mood, and feelings of hopefulness. Listed responsibilities of different people involved in care. This could include family members. A family member may agree to remove firearms or other lethal weapons/substances from your ease of access. A timeline. A timeline can be in place from one therapy session to the next session. HOME CARE INSTRUCTIONS   Follow your no-harm safety contract.   Contact your therapist and/or doctor if you have any questions or concerns.  MAKE SURE YOU:   Understand these instructions.   Will watch your condition. Noticing any mood changes or suicidal urges.   Will get help right away if you are not doing well or get worse.  Document Released: 02/23/2010 Document Revised: 08/25/2011 Document Reviewed: 02/23/2010 [facility] Patient Information 2012 [place], [place].  Urinary Tract Infection Infections of the urinary tract can start in several places. A bladder infection (cystitis), a kidney infection (pyelonephritis), and  a prostate infection (prostatitis) are different types of urinary tract infections (UTIs). They usually get better if treated with medicines (antibiotics) that kill germs. Take all the medicine until it is gone. You or your child may feel better in a few days, but TAKE ALL MEDICINE or the infection may not respond and may become more difficult to treat. HOME CARE INSTRUCTIONS   Drink enough water and fluids to keep the urine clear or pale yellow. Cranberry juice is especially recommended, in addition to large amounts of water.   Avoid caffeine, tea, and carbonated beverages. They tend to irritate the bladder.   Alcohol may irritate the prostate.   Only take over-the-counter or prescription medicines for pain, discomfort, or fever as directed by your caregiver.  To prevent further infections:  Empty the bladder often. Avoid holding urine for long periods of time.   After a bowel movement, women should cleanse from front to back. Use each tissue only once.   Empty the bladder before and after sexual intercourse.  FINDING OUT THE RESULTS OF YOUR TEST Not all test results are available during your visit. If your or your child's test results are not back during the visit, make an appointment with your caregiver to find out the results. Do not assume everything is normal if you have not heard from your caregiver or the medical facility. It is important for you to follow up on all test results.  SEEK MEDICAL CARE IF:   There is back pain.   Your baby is older than 3 months with a rectal temperature of 100.5 F (38.1 C) or higher for more than 1 day.   Your or your child's problems (symptoms) are no better in 3 days. Return sooner if you or your child is getting worse.  SEEK IMMEDIATE MEDICAL CARE IF:   There is severe back pain or lower abdominal pain.   You or your child develops chills.   You have a fever.   Your baby is older than 3 months with a rectal temperature of 102 F (38.9 C)  or higher.   Your baby is 69 months old or younger with a rectal temperature of 100.4 F (38 C) or higher.   There is nausea or vomiting.   There is continued burning or discomfort with urination.  MAKE SURE YOU:   Understand these instructions.   Will watch your condition.   Will get help right away if you are not doing well or get worse.  Document Released: 06/15/2005 Document Revised: 08/25/2011 Document Reviewed: 01/18/2007 [facility] Patient Information 2012 [place], [place].

## 2012-03-02 NOTE — ED Notes (Signed)
Personal items returned to patient.

## 2012-03-02 NOTE — ED Notes (Signed)
Pt was requesting some of his home meds. Md ordered remeron, advair and keflex, all given to patient. Pt resting quietly at this time, no issues at this time, pt in view of nursing station.

## 2012-03-02 NOTE — ED Notes (Signed)
Bus pass given to patient.

## 2012-03-03 LAB — URINE CULTURE

## 2012-03-16 ENCOUNTER — Inpatient Hospital Stay (HOSPITAL_COMMUNITY)
Admission: EM | Admit: 2012-03-16 | Discharge: 2012-03-19 | DRG: 195 | Disposition: A | Discharge status: Home / Self Care | Payer: Medicare (Managed Care) | Attending: Internal Medicine | Admitting: Internal Medicine

## 2012-03-16 ENCOUNTER — Encounter (HOSPITAL_COMMUNITY): Payer: Self-pay | Admitting: *Deleted

## 2012-03-16 DIAGNOSIS — F17200 Nicotine dependence, unspecified, uncomplicated: Secondary | ICD-10-CM | POA: Diagnosis present

## 2012-03-16 DIAGNOSIS — F411 Generalized anxiety disorder: Secondary | ICD-10-CM | POA: Diagnosis present

## 2012-03-16 DIAGNOSIS — J189 Pneumonia, unspecified organism: Principal | ICD-10-CM | POA: Diagnosis present

## 2012-03-16 DIAGNOSIS — F329 Major depressive disorder, single episode, unspecified: Secondary | ICD-10-CM | POA: Diagnosis present

## 2012-03-16 DIAGNOSIS — Z79899 Other long term (current) drug therapy: Secondary | ICD-10-CM

## 2012-03-16 DIAGNOSIS — Z8711 Personal history of peptic ulcer disease: Secondary | ICD-10-CM

## 2012-03-16 DIAGNOSIS — J449 Chronic obstructive pulmonary disease, unspecified: Secondary | ICD-10-CM

## 2012-03-16 DIAGNOSIS — E119 Type 2 diabetes mellitus without complications: Secondary | ICD-10-CM | POA: Diagnosis present

## 2012-03-16 DIAGNOSIS — J4489 Other specified chronic obstructive pulmonary disease: Secondary | ICD-10-CM | POA: Diagnosis present

## 2012-03-16 DIAGNOSIS — F3289 Other specified depressive episodes: Secondary | ICD-10-CM | POA: Diagnosis present

## 2012-03-16 DIAGNOSIS — E860 Dehydration: Secondary | ICD-10-CM | POA: Diagnosis present

## 2012-03-16 DIAGNOSIS — I10 Essential (primary) hypertension: Secondary | ICD-10-CM | POA: Diagnosis present

## 2012-03-16 DIAGNOSIS — Z720 Tobacco use: Secondary | ICD-10-CM | POA: Diagnosis present

## 2012-03-16 DIAGNOSIS — R531 Weakness: Secondary | ICD-10-CM

## 2012-03-16 DIAGNOSIS — R5381 Other malaise: Secondary | ICD-10-CM | POA: Diagnosis present

## 2012-03-16 DIAGNOSIS — K219 Gastro-esophageal reflux disease without esophagitis: Secondary | ICD-10-CM | POA: Diagnosis present

## 2012-03-16 DIAGNOSIS — E785 Hyperlipidemia, unspecified: Secondary | ICD-10-CM | POA: Diagnosis present

## 2012-03-16 NOTE — ED Notes (Signed)
Walked .25 mile and felt weak and almost fell. Stopped taking metformin recently. CBG 72 with EMS.

## 2012-03-16 NOTE — ED Notes (Signed)
[id number]<AV> Expected date:<BR> Expected time:<BR> Means of arrival:<BR> Comments:<BR> Rm #11. EMS 32 PTAR, 77 yom weakness w dizziness

## 2012-03-17 ENCOUNTER — Encounter (HOSPITAL_COMMUNITY): Payer: Self-pay | Admitting: Internal Medicine

## 2012-03-17 ENCOUNTER — Emergency Department (HOSPITAL_COMMUNITY): Payer: Medicare (Managed Care)

## 2012-03-17 DIAGNOSIS — J189 Pneumonia, unspecified organism: Secondary | ICD-10-CM | POA: Diagnosis present

## 2012-03-17 DIAGNOSIS — I10 Essential (primary) hypertension: Secondary | ICD-10-CM

## 2012-03-17 DIAGNOSIS — R5383 Other fatigue: Secondary | ICD-10-CM

## 2012-03-17 DIAGNOSIS — R531 Weakness: Secondary | ICD-10-CM | POA: Diagnosis present

## 2012-03-17 DIAGNOSIS — R5381 Other malaise: Secondary | ICD-10-CM

## 2012-03-17 DIAGNOSIS — E860 Dehydration: Secondary | ICD-10-CM

## 2012-03-17 DIAGNOSIS — J159 Unspecified bacterial pneumonia: Secondary | ICD-10-CM

## 2012-03-17 LAB — COMPREHENSIVE METABOLIC PANEL
Albumin: 3.5 g/dL (ref 3.5–5.2)
Alkaline Phosphatase: 78 U/L (ref 39–117)
BUN: 23 mg/dL (ref 6–23)
CO2: 26 mEq/L (ref 19–32)
Chloride: 105 mEq/L (ref 96–112)
GFR calc non Af Amer: 57 mL/min — ABNORMAL LOW (ref >90)
Potassium: 3.7 mEq/L (ref 3.5–5.1)
Total Bilirubin: 1 mg/dL (ref 0.3–1.2)

## 2012-03-17 LAB — CARDIAC PANEL(CRET KIN+CKTOT+MB+TROPI)
CK, MB: 1.3 ng/mL (ref 0.3–4.0)
Relative Index: INVALID (ref 0.0–2.5)
Relative Index: INVALID (ref 0.0–2.5)
Total CK: 39 U/L (ref 7–232)
Troponin I: <0.3 ng/mL (ref <0.30)

## 2012-03-17 LAB — URINE MICROSCOPIC-ADD ON

## 2012-03-17 LAB — URINALYSIS, ROUTINE W REFLEX MICROSCOPIC
Glucose, UA: NEGATIVE mg/dL
Hgb urine dipstick: NEGATIVE
Protein, ur: NEGATIVE mg/dL
Specific Gravity, Urine: 1.024 (ref 1.005–1.030)
pH: 6 (ref 5.0–8.0)

## 2012-03-17 LAB — CBC
HCT: 45 % (ref 39.0–52.0)
Hemoglobin: 15.5 g/dL (ref 13.0–17.0)
Hemoglobin: 14.3 g/dL (ref 13.0–17.0)
MCH: 30.8 pg (ref 26.0–34.0)
MCHC: 35.1 g/dL (ref 30.0–36.0)
MCV: 87.9 fL (ref 78.0–100.0)
RBC: 5.12 MIL/uL (ref 4.22–5.81)
RDW: 14.9 % (ref 11.5–15.5)
WBC: 11.3 10*3/uL — ABNORMAL HIGH (ref 4.0–10.5)

## 2012-03-17 LAB — POCT I-STAT, CHEM 8
BUN: 27 mg/dL — ABNORMAL HIGH (ref 6–23)
Creatinine, Ser: 1.3 mg/dL (ref 0.50–1.35)
Sodium: 143 mEq/L (ref 135–145)
TCO2: 26 mmol/L (ref 0–100)

## 2012-03-17 LAB — CREATININE, SERUM: Creatinine, Ser: 0.9 mg/dL (ref 0.50–1.35)

## 2012-03-17 LAB — GLUCOSE, CAPILLARY
Glucose-Capillary: 100 mg/dL — ABNORMAL HIGH (ref 70–99)
Glucose-Capillary: 87 mg/dL (ref 70–99)

## 2012-03-17 LAB — HEMOGLOBIN A1C: Hgb A1c MFr Bld: 6.2 % — ABNORMAL HIGH (ref <5.7)

## 2012-03-17 MED ORDER — HYDROCOD POLST-CHLORPHEN POLST 10-8 MG/5ML PO LQCR
5.0000 mL | Freq: Two times a day (BID) | ORAL | Status: DC | PRN
  Administered 2012-03-18: 5 mL via ORAL
  Filled 2012-03-17: qty 5

## 2012-03-17 MED ORDER — INSULIN ASPART 100 UNIT/ML ~~LOC~~ SOLN: 0.0000 [IU] | Freq: Every day | SUBCUTANEOUS | Status: DC

## 2012-03-17 MED ORDER — SODIUM CHLORIDE 0.9 % IV BOLUS (SEPSIS)
1000.0000 mL | Freq: Once | INTRAVENOUS | Status: AC
  Administered 2012-03-17: 1000 mL via INTRAVENOUS

## 2012-03-17 MED ORDER — TAMSULOSIN HCL 0.4 MG PO CAPS
0.4000 mg | ORAL_CAPSULE | Freq: Every day | ORAL | Status: DC
  Administered 2012-03-17 – 2012-03-19 (×3): 0.4 mg via ORAL
  Filled 2012-03-17 (×4): qty 1

## 2012-03-17 MED ORDER — ALBUTEROL SULFATE (5 MG/ML) 0.5% IN NEBU
2.5000 mg | INHALATION_SOLUTION | Freq: Three times a day (TID) | RESPIRATORY_TRACT | Status: DC
  Administered 2012-03-17 – 2012-03-19 (×6): 2.5 mg via RESPIRATORY_TRACT
  Filled 2012-03-17 (×6): qty 0.5

## 2012-03-17 MED ORDER — HYDROCODONE-ACETAMINOPHEN 5-325 MG PO TABS
1.0000 | ORAL_TABLET | Freq: Four times a day (QID) | ORAL | Status: DC | PRN
Start: 2012-03-17 — End: 2012-03-19
  Administered 2012-03-17: 1 via ORAL
  Filled 2012-03-17: qty 1

## 2012-03-17 MED ORDER — SODIUM CHLORIDE 0.9 % IV SOLN: INTRAVENOUS | Status: DC

## 2012-03-17 MED ORDER — FLUTICASONE-SALMETEROL 100-50 MCG/DOSE IN AEPB
1.0000 | INHALATION_SPRAY | Freq: Two times a day (BID) | RESPIRATORY_TRACT | Status: DC
  Administered 2012-03-17 – 2012-03-19 (×5): 1 via RESPIRATORY_TRACT
  Filled 2012-03-17: qty 14

## 2012-03-17 MED ORDER — DEXTROSE 5 % IV SOLN: 500.0000 mg | INTRAVENOUS | Status: DC

## 2012-03-17 MED ORDER — NAPROXEN 250 MG PO TABS
500.0000 mg | ORAL_TABLET | Freq: Two times a day (BID) | ORAL | Status: DC
  Administered 2012-03-17 – 2012-03-19 (×5): 500 mg via ORAL
  Filled 2012-03-17 (×7): qty 2

## 2012-03-17 MED ORDER — INSULIN ASPART 100 UNIT/ML ~~LOC~~ SOLN
0.0000 [IU] | Freq: Three times a day (TID) | SUBCUTANEOUS | Status: DC
  Administered 2012-03-17: 2 [IU] via SUBCUTANEOUS

## 2012-03-17 MED ORDER — ONDANSETRON HCL 4 MG PO TABS: 4.0000 mg | ORAL_TABLET | Freq: Four times a day (QID) | ORAL | Status: DC | PRN

## 2012-03-17 MED ORDER — SODIUM CHLORIDE 0.9 % IJ SOLN
3.0000 mL | Freq: Two times a day (BID) | INTRAMUSCULAR | Status: DC
  Administered 2012-03-17 – 2012-03-18 (×2): 3 mL via INTRAVENOUS

## 2012-03-17 MED ORDER — DEXTROSE 5 % IV SOLN
500.0000 mg | INTRAVENOUS | Status: DC
  Administered 2012-03-17 – 2012-03-18 (×3): 500 mg via INTRAVENOUS
  Filled 2012-03-17 (×4): qty 500

## 2012-03-17 MED ORDER — MORPHINE SULFATE 2 MG/ML IJ SOLN: 2.0000 mg | INTRAMUSCULAR | Status: DC | PRN

## 2012-03-17 MED ORDER — ATORVASTATIN CALCIUM 20 MG PO TABS
20.0000 mg | ORAL_TABLET | Freq: Every day | ORAL | Status: DC
  Administered 2012-03-17 – 2012-03-18 (×2): 20 mg via ORAL
  Filled 2012-03-17 (×3): qty 1

## 2012-03-17 MED ORDER — DEXTROSE 5 % IV SOLN
1.0000 g | Freq: Every day | INTRAVENOUS | Status: DC
  Administered 2012-03-17 – 2012-03-18 (×2): 1 g via INTRAVENOUS
  Filled 2012-03-17 (×3): qty 10

## 2012-03-17 MED ORDER — DEXTROSE 5 % IV SOLN
1.0000 g | Freq: Once | INTRAVENOUS | Status: AC
  Administered 2012-03-17: 1 g via INTRAVENOUS
  Filled 2012-03-17: qty 10

## 2012-03-17 MED ORDER — ALBUTEROL SULFATE (5 MG/ML) 0.5% IN NEBU
2.5000 mg | INHALATION_SOLUTION | RESPIRATORY_TRACT | Status: DC | PRN
  Filled 2012-03-17: qty 0.5

## 2012-03-17 MED ORDER — ENOXAPARIN SODIUM 40 MG/0.4ML ~~LOC~~ SOLN
40.0000 mg | SUBCUTANEOUS | Status: DC
  Administered 2012-03-17 – 2012-03-19 (×3): 40 mg via SUBCUTANEOUS
  Filled 2012-03-17 (×3): qty 0.4

## 2012-03-17 MED ORDER — DM-GUAIFENESIN ER 30-600 MG PO TB12
1.0000 | ORAL_TABLET | Freq: Two times a day (BID) | ORAL | Status: DC
  Administered 2012-03-18 – 2012-03-19 (×3): 1 via ORAL
  Filled 2012-03-17 (×5): qty 1

## 2012-03-17 MED ORDER — IPRATROPIUM BROMIDE 0.02 % IN SOLN
0.5000 mg | Freq: Three times a day (TID) | RESPIRATORY_TRACT | Status: DC
  Administered 2012-03-17 – 2012-03-19 (×7): 0.5 mg via RESPIRATORY_TRACT
  Filled 2012-03-17 (×7): qty 2.5

## 2012-03-17 MED ORDER — ALBUTEROL SULFATE (5 MG/ML) 0.5% IN NEBU
2.5000 mg | INHALATION_SOLUTION | Freq: Four times a day (QID) | RESPIRATORY_TRACT | Status: DC
  Administered 2012-03-17 (×2): 2.5 mg via RESPIRATORY_TRACT
  Filled 2012-03-17: qty 0.5

## 2012-03-17 MED ORDER — POTASSIUM CHLORIDE IN NACL 20-0.9 MEQ/L-% IV SOLN
INTRAVENOUS | Status: DC
  Administered 2012-03-17 – 2012-03-19 (×4): via INTRAVENOUS
  Filled 2012-03-17 (×7): qty 1000

## 2012-03-17 MED ORDER — ACETAMINOPHEN 325 MG PO TABS
650.0000 mg | ORAL_TABLET | Freq: Four times a day (QID) | ORAL | Status: DC | PRN
  Administered 2012-03-17: 650 mg via ORAL
  Filled 2012-03-17: qty 2

## 2012-03-17 MED ORDER — ONDANSETRON HCL 4 MG/2ML IJ SOLN: 4.0000 mg | Freq: Four times a day (QID) | INTRAMUSCULAR | Status: DC | PRN

## 2012-03-17 MED ORDER — AMLODIPINE BESYLATE 5 MG PO TABS
5.0000 mg | ORAL_TABLET | Freq: Every day | ORAL | Status: DC
  Administered 2012-03-17 – 2012-03-19 (×3): 5 mg via ORAL
  Filled 2012-03-17 (×3): qty 1

## 2012-03-17 NOTE — Progress Notes (Signed)
I have seen and examined pt admitted this am with CAP - states cough about the same, denies SOB. C/o of headache- tylenol ordered. Will continue current management plan as per Dr Conley Rolls and follow  Donnalee Curry [phone]

## 2012-03-17 NOTE — ED Notes (Signed)
Pt ambulated in room however is dizzy and doesn't tolerate well. Unsteady gait needs 1 assist from staff.

## 2012-03-17 NOTE — H&P (Signed)
PCP:   [name], MD   Chief Complaint: Yellow productive cough, diffuse weakness.   HPI: [name] [name] is an 78 y.o. male with type 2 diabetes, bullous emphysema, COPD, asthma, GERD, hyperlipidemia, anxiety and depression, presents to the emergency room as he has been feeling weak for the past 2 days. He can hardly walk. He has not been taking adequate amount of by mouth. He also has been coughing green sputum, but denied shortness of breath, chest pain, fever or chills. Evaluation in emergency room included a negative head CT as he was complaining of headache, chest x-ray shows increased density which could represent pneumonia, and bibasilar opacifications consistent with either atelectasis or infiltrate. He has a mild leukocytosis with white count of 11.3 thousand, hemoglobin of 15.5, unremarkable electrolytes, BUN of 27 and creatinine of 1.3. His blood glucose and calcium son normal as well. Hospitalist was asked to admit patient because of volume depletion, community acquired pneumonia, and generalized weakness.  Rewiew of Systems:  The patient denies anorexia, fever, weight loss,, vision loss, decreased hearing, hoarseness, chest pain, syncope, , peripheral edema, balance deficits, hemoptysis, abdominal pain, melena, hematochezia, severe indigestion/heartburn, hematuria, incontinence, genital sores, muscle weakness, suspicious skin lesions, transient blindness, difficulty walking, depression, unusual weight change, abnormal bleeding, enlarged lymph nodes, angioedema, and breast masses.    Past Medical History  Diagnosis Date  . DIABETES MELLITUS, TYPE II 11/03/2009  . HYPERLIPIDEMIA 11/03/2009  . ANXIETY 11/03/2009  . DEPRESSION 11/03/2009  . RESTLESS LEG SYNDROME 11/03/2009  . HYPERTENSION 11/03/2009  . CHRONIC OBSTRUCTIVE PULMONARY DISEASE, ACUTE EXACERBATION 11/03/2009  . EMPHYSEMA, BULLOUS 11/03/2009  . ASTHMA 11/03/2009  . COPD 11/03/2009  . GERD 11/03/2009  . PEPTIC ULCER DISEASE  11/03/2009  . ABSCESS, FINGER 04/07/2010  . ABSCESS 12/03/2009  . DISC DISEASE, LUMBAR 11/03/2009  . SPINAL STENOSIS, LUMBAR 11/03/2009  . RASH-NONVESICULAR 11/03/2009  . Kidney stones 01/30/12    "I've had them 7 times; always have passed them"  . Shortness of breath     "sometimes; at any time"    Past Surgical History  Procedure Date  . Rotator cuff repair 2003    left  . Tonsillectomy 1960  . Foot neuroma surgery 1982    left  . Inguinal hernia repair 10/2011    left    Medications:  HOME MEDS: Prior to Admission medications   Medication Sig Start Date End Date Taking? Authorizing Provider  amLODipine (NORVASC) 5 MG tablet Take 5 mg by mouth daily.   Yes Historical Provider, MD  atorvastatin (LIPITOR) 20 MG tablet Take 1 tablet (20 mg total) by mouth daily at 6 PM. 02/03/12 02/02/13 Yes Standley Brooking, MD  Fluticasone-Salmeterol (ADVAIR DISKUS) 100-50 MCG/DOSE AEPB Inhale 1 puff into the lungs every 12 (twelve) hours. 08/13/11  Yes Erick Blinks, MD  naproxen sodium (ANAPROX) 220 MG tablet Take 440 mg by mouth 2 (two) times daily with a meal. For headaches   Yes Historical Provider, MD  Tamsulosin HCl (FLOMAX) 0.4 MG CAPS Take 1 capsule (0.4 mg total) by mouth daily after breakfast. 02/03/12  Yes Standley Brooking, MD     Allergies:  No Known Allergies  Social History:   reports that he has been smoking Cigarettes.  He has a 41 pack-year smoking history. He has never used smokeless tobacco. He reports that he drinks alcohol. He reports that he does not use illicit drugs.  Family History: Family History  Problem Relation Age of Onset  . Heart disease Mother   .  Cancer Brother     lung     Physical Exam: Filed Vitals:   03/17/12 0327 03/17/12 0345 03/17/12 0347 03/17/12 0347  BP:  121/56 139/69 108/67  Pulse:  85 81 85  Temp: 98.8 F (37.1 C)     TempSrc: Rectal     Resp:      SpO2:       Blood pressure 108/67, pulse 85, temperature 98.8 F (37.1 C),  temperature source Rectal, resp. rate 16, SpO2 94.00%.  GEN:  Pleasant  person lying in the stretcher in no acute distress; cooperative with exam PSYCH:  alert and oriented x4; does not appear anxious or depressed; affect is appropriate. HEENT: Mucous membranes pink and anicteric; PERRLA; EOM intact; no cervical lymphadenopathy nor thyromegaly or carotid bruit; no JVD; Breasts:: Not examined CHEST WALL: No tenderness CHEST: Normal respiration, he has scattered rhonchi with minimal wheezes. No rales HEART: Regular rate and rhythm; no murmurs rubs or gallops BACK: No kyphosis or scoliosis; no CVA tenderness ABDOMEN: Obese, soft non-tender; no masses, no organomegaly, normal abdominal bowel sounds; no pannus; no intertriginous candida. Rectal Exam: Not done EXTREMITIES: No bone or joint deformity; age-appropriate arthropathy of the hands and knees; bilateral trace ankle edema; no ulcerations. Genitalia: not examined PULSES: 2+ and symmetric SKIN: Normal hydration no rash or ulceration CNS: Cranial nerves 2-12 grossly intact no focal lateralizing neurologic deficit   Labs & Imaging Results for orders placed during the hospital encounter of 03/16/12 (from the past 48 hour(s))  CBC     Status: Abnormal   Collection Time   03/17/12  1:50 AM      Component Value Range Comment   WBC 11.3 (*) 4.0 - 10.5 K/uL    RBC 5.12  4.22 - 5.81 MIL/uL    Hemoglobin 15.5  13.0 - 17.0 g/dL    HCT 45.4  09.8 - 11.9 %    MCV 87.9  78.0 - 100.0 fL    MCH 30.3  26.0 - 34.0 pg    MCHC 34.4  30.0 - 36.0 g/dL    RDW 14.7  82.9 - 56.2 %    Platelets 258  150 - 400 K/uL   COMPREHENSIVE METABOLIC PANEL     Status: Abnormal   Collection Time   03/17/12  1:50 AM      Component Value Range Comment   Sodium 139  135 - 145 mEq/L    Potassium 3.7  3.5 - 5.1 mEq/L    Chloride 105  96 - 112 mEq/L    CO2 26  19 - 32 mEq/L    Glucose, Bld 92  70 - 99 mg/dL    BUN 23  6 - 23 mg/dL    Creatinine, Ser 1.30  0.50 - 1.35  mg/dL    Calcium 9.5  8.4 - 86.5 mg/dL    Total Protein 7.2  6.0 - 8.3 g/dL    Albumin 3.5  3.5 - 5.2 g/dL    AST 10  0 - 37 U/L    ALT 9  0 - 53 U/L    Alkaline Phosphatase 78  39 - 117 U/L    Total Bilirubin 1.0  0.3 - 1.2 mg/dL    GFR calc non Af Amer 57 (*) >90 mL/min    GFR calc Af Amer 66 (*) >90 mL/min   POCT I-STAT, CHEM 8     Status: Abnormal   Collection Time   03/17/12  2:05 AM      Component  Value Range Comment   Sodium 143  135 - 145 mEq/L    Potassium 4.0  3.5 - 5.1 mEq/L    Chloride 107  96 - 112 mEq/L    BUN 27 (*) 6 - 23 mg/dL    Creatinine, Ser 7.82  0.50 - 1.35 mg/dL    Glucose, Bld 90  70 - 99 mg/dL    Calcium, Ion 9.56  2.13 - 1.32 mmol/L    TCO2 26  0 - 100 mmol/L    Hemoglobin 15.3  13.0 - 17.0 g/dL    HCT 08.6  57.8 - 46.9 %   URINALYSIS, ROUTINE W REFLEX MICROSCOPIC     Status: Abnormal   Collection Time   03/17/12  2:16 AM      Component Value Range Comment   Color, Urine YELLOW  YELLOW    APPearance CLEAR  CLEAR    Specific Gravity, Urine 1.024  1.005 - 1.030    pH 6.0  5.0 - 8.0    Glucose, UA NEGATIVE  NEGATIVE mg/dL    Hgb urine dipstick NEGATIVE  NEGATIVE    Bilirubin Urine NEGATIVE  NEGATIVE    Ketones, ur NEGATIVE  NEGATIVE mg/dL    Protein, ur NEGATIVE  NEGATIVE mg/dL    Urobilinogen, UA 0.2  0.0 - 1.0 mg/dL    Nitrite NEGATIVE  NEGATIVE    Leukocytes, UA SMALL (*) NEGATIVE   URINE MICROSCOPIC-ADD ON     Status: Normal   Collection Time   03/17/12  2:16 AM      Component Value Range Comment   Squamous Epithelial / LPF RARE  RARE    WBC, UA 3-6  <3 WBC/hpf    Urine-Other MUCOUS PRESENT      Ct Head Wo Contrast  03/17/2012  *RADIOLOGY REPORT*  Clinical Data: Weakness, dizziness  CT HEAD WITHOUT CONTRAST  Technique:  Contiguous axial images were obtained from the base of the skull through the vertex without contrast.  Comparison: 08/11/2011 CT and MRI  Findings: Mild periventricular and subcortical white matter hypodensities are most in  keeping with chronic microangiopathic change. There is no evidence for acute hemorrhage, hydrocephalus, mass lesion, or abnormal extra-axial fluid collection.  No definite CT evidence for acute infarction.  The visualized paranasal sinuses and mastoid air cells are predominately clear.  IMPRESSION: Mild white matter changes.  No definite acute intracranial abnormality.  Original Report Authenticated By: Waneta Martins, M.D.   Dg Chest Portable 1 View  03/17/2012  *RADIOLOGY REPORT*  Clinical Data: Weakness, headache.  PORTABLE CHEST - 1 VIEW  Comparison: 01/17/2012  Findings: Coarse interstitial markings and emphysematous changes, most pronounced on the left.  Prominent cardiomediastinal contours. Increased interstitial markings and lung base opacities.  No pneumothorax.  No definite pleural effusion.  No acute osseous finding.  IMPRESSION: COPD and prominent cardiomediastinal contours are similar to prior.  Increased interstitial prominence superimposed on the chronic changes, may reflect atypical infection or edema.  Bibasilar opacities may reflect atelectasis or pneumonia.  Original Report Authenticated By: Waneta Martins, M.D.      Assessment Present on Admission:  .DIABETES MELLITUS, TYPE II .COPD .Dehydration .Tobacco abuse .HYPERTENSION .HYPERLIPIDEMIA .PNA (pneumonia) .Weakness generalized  PLAN:  For his community-acquired pneumonia, which I believe he has, we'll treat him with Rocephin and Zithromax. I also believe is dehydrated, and will give gentle IV fluid. I think this is what made him weak as well. Unfortunately, his to smoke and I encouraged him to quit smoking. I  am certainly not convinced I will be successful. His hypertension and hyperlipidemia as are stable, and I will continue his medications. For his diabetes, we'll put him on modify carbohydrate diet and will use insulin sliding scale in addition as well. He is stable, full code, and will be admitted to [facility]  hospitalist service.   Other plans as per orders.    Bensen Chadderdon 03/17/2012, 4:52 AM

## 2012-03-17 NOTE — ED Provider Notes (Signed)
History     CSN: [id number]  Arrival date & time 03/16/12  2314   First MD Initiated Contact with Patient 03/17/12 0121      Chief Complaint  Patient presents with  . Weakness  . Headache    (Consider location/radiation/quality/duration/timing/severity/associated sxs/prior treatment) HPI  history provided by patient. Feeling generalized weakness today. Normally able to walk a significant distance and today walk about a quarter mile felt too weak to 20 further. Called EMS. No chest pain or shortness of breath. No fevers or chills. No unilateral weakness or symptoms. Some mild headache. No leg pain or swelling. Does have history of depression but denies any related or exacerbating depression stressors today.   Patient feels like he seem to the hospital. Symptoms moderate severity. Per EMS blood sugar normal range. Patient admits to stopping his metformin recently. No known aggravating or alleviating factors. Patient states he drinks a lot of water denies feeling dehydrated today. Past Medical History  Diagnosis Date  . DIABETES MELLITUS, TYPE II 11/03/2009  . HYPERLIPIDEMIA 11/03/2009  . ANXIETY 11/03/2009  . DEPRESSION 11/03/2009  . RESTLESS LEG SYNDROME 11/03/2009  . HYPERTENSION 11/03/2009  . CHRONIC OBSTRUCTIVE PULMONARY DISEASE, ACUTE EXACERBATION 11/03/2009  . EMPHYSEMA, BULLOUS 11/03/2009  . ASTHMA 11/03/2009  . COPD 11/03/2009  . GERD 11/03/2009  . PEPTIC ULCER DISEASE 11/03/2009  . ABSCESS, FINGER 04/07/2010  . ABSCESS 12/03/2009  . DISC DISEASE, LUMBAR 11/03/2009  . SPINAL STENOSIS, LUMBAR 11/03/2009  . RASH-NONVESICULAR 11/03/2009  . Kidney stones 01/30/12    "I've had them 7 times; always have passed them"  . Shortness of breath     "sometimes; at any time"    Past Surgical History  Procedure Date  . Rotator cuff repair 2003    left  . Tonsillectomy 1960  . Foot neuroma surgery 1982    left  . Inguinal hernia repair 10/2011    left    Family History  Problem Relation Age  of Onset  . Heart disease Mother   . Cancer Brother     lung    History  Substance Use Topics  . Smoking status: Current Everyday Smoker -- 1.0 packs/day for 41 years    Types: Cigarettes  . Smokeless tobacco: Never Used   Comment: "stopped smoking 04/21/1991 then restarted in 2012"  . Alcohol Use: Yes     01/30/12 "might go a year or 2 then drink a couple beers"      Review of Systems  Constitutional: Negative for fever and chills.  HENT: Negative for neck pain and neck stiffness.   Eyes: Negative for pain.  Respiratory: Negative for shortness of breath.   Cardiovascular: Negative for chest pain.  Gastrointestinal: Negative for abdominal pain.  Genitourinary: Negative for dysuria.  Musculoskeletal: Negative for back pain.  Skin: Negative for rash.  Neurological: Positive for weakness. Negative for headaches.  All other systems reviewed and are negative.    Allergies  Review of patient's allergies indicates no known allergies.  Home Medications   Current Outpatient Rx  Name Route Sig Dispense Refill  . AMLODIPINE BESYLATE 5 MG PO TABS Oral Take 5 mg by mouth daily.    . ATORVASTATIN CALCIUM 20 MG PO TABS Oral Take 1 tablet (20 mg total) by mouth daily at 6 PM. 30 tablet 0  . FLUTICASONE-SALMETEROL 100-50 MCG/DOSE IN AEPB Inhalation Inhale 1 puff into the lungs every 12 (twelve) hours. 60 each 0  . NAPROXEN SODIUM 220 MG PO TABS Oral Take 440  mg by mouth 2 (two) times daily with a meal. For headaches    . TAMSULOSIN HCL 0.4 MG PO CAPS Oral Take 1 capsule (0.4 mg total) by mouth daily after breakfast. 30 capsule 0    BP 135/80  Pulse 85  Temp 97.4 F (36.3 C) (Oral)  Resp 16  SpO2 94%  Physical Exam  Constitutional: He is oriented to person, place, and time. He appears well-developed and well-nourished.  HENT:  Head: Normocephalic and atraumatic.  Eyes: Conjunctivae and EOM are normal. Pupils are equal, round, and reactive to light.  Neck: Trachea normal. Neck  supple. No thyromegaly present.  Cardiovascular: Normal rate, regular rhythm, S1 normal, S2 normal and normal pulses.     No systolic murmur is present   No diastolic murmur is present  Pulses:      Radial pulses are 2+ on the right side, and 2+ on the left side.  Pulmonary/Chest: Effort normal and breath sounds normal. He has no wheezes. He has no rhonchi. He has no rales. He exhibits no tenderness.  Abdominal: Soft. Normal appearance and bowel sounds are normal. There is no tenderness. There is no CVA tenderness and negative Murphy's sign.  Musculoskeletal:       BLE:s Calves nontender, no cords or erythema, negative Homans sign  Neurological: He is alert and oriented to person, place, and time. He has normal strength. No cranial nerve deficit or sensory deficit. GCS eye subscore is 4. GCS verbal subscore is 5. GCS motor subscore is 6.  Skin: Skin is warm and dry. No rash noted. He is not diaphoretic.  Psychiatric: His speech is normal.       Cooperative and appropriate    ED Course  Procedures (including critical care time)   Results for orders placed during the hospital encounter of 03/16/12  CBC      Component Value Range   WBC 11.3 (*) 4.0 - 10.5 K/uL   RBC 5.12  4.22 - 5.81 MIL/uL   Hemoglobin 15.5  13.0 - 17.0 g/dL   HCT 16.1  09.6 - 04.5 %   MCV 87.9  78.0 - 100.0 fL   MCH 30.3  26.0 - 34.0 pg   MCHC 34.4  30.0 - 36.0 g/dL   RDW 40.9  81.1 - 91.4 %   Platelets 258  150 - 400 K/uL  COMPREHENSIVE METABOLIC PANEL      Component Value Range   Sodium 139  135 - 145 mEq/L   Potassium 3.7  3.5 - 5.1 mEq/L   Chloride 105  96 - 112 mEq/L   CO2 26  19 - 32 mEq/L   Glucose, Bld 92  70 - 99 mg/dL   BUN 23  6 - 23 mg/dL   Creatinine, Ser 7.82  0.50 - 1.35 mg/dL   Calcium 9.5  8.4 - 95.6 mg/dL   Total Protein 7.2  6.0 - 8.3 g/dL   Albumin 3.5  3.5 - 5.2 g/dL   AST 10  0 - 37 U/L   ALT 9  0 - 53 U/L   Alkaline Phosphatase 78  39 - 117 U/L   Total Bilirubin 1.0  0.3 - 1.2  mg/dL   GFR calc non Af Amer 57 (*) >90 mL/min   GFR calc Af Amer 66 (*) >90 mL/min  POCT I-STAT, CHEM 8      Component Value Range   Sodium 143  135 - 145 mEq/L   Potassium 4.0  3.5 - 5.1 mEq/L  Chloride 107  96 - 112 mEq/L   BUN 27 (*) 6 - 23 mg/dL   Creatinine, Ser 1.61  0.50 - 1.35 mg/dL   Glucose, Bld 90  70 - 99 mg/dL   Calcium, Ion 0.96  0.45 - 1.32 mmol/L   TCO2 26  0 - 100 mmol/L   Hemoglobin 15.3  13.0 - 17.0 g/dL   HCT 40.9  81.1 - 91.4 %   Ct Head Wo Contrast  03/17/2012  *RADIOLOGY REPORT*  Clinical Data: Weakness, dizziness  CT HEAD WITHOUT CONTRAST  Technique:  Contiguous axial images were obtained from the base of the skull through the vertex without contrast.  Comparison: 08/11/2011 CT and MRI  Findings: Mild periventricular and subcortical white matter hypodensities are most in keeping with chronic microangiopathic change. There is no evidence for acute hemorrhage, hydrocephalus, mass lesion, or abnormal extra-axial fluid collection.  No definite CT evidence for acute infarction.  The visualized paranasal sinuses and mastoid air cells are predominately clear.  IMPRESSION: Mild white matter changes.  No definite acute intracranial abnormality.  Original Report Authenticated By: Waneta Martins, M.D.   Dg Chest Portable 1 View  03/17/2012  *RADIOLOGY REPORT*  Clinical Data: Weakness, headache.  PORTABLE CHEST - 1 VIEW  Comparison: 01/17/2012  Findings: Coarse interstitial markings and emphysematous changes, most pronounced on the left.  Prominent cardiomediastinal contours. Increased interstitial markings and lung base opacities.  No pneumothorax.  No definite pleural effusion.  No acute osseous finding.  IMPRESSION: COPD and prominent cardiomediastinal contours are similar to prior.  Increased interstitial prominence superimposed on the chronic changes, may reflect atypical infection or edema.  Bibasilar opacities may reflect atelectasis or pneumonia.  Original Report  Authenticated By: Waneta Martins, M.D.     Date: 03/17/2012  Rate: 84  Rhythm: normal sinus rhythm  QRS Axis: normal  Intervals: normal  ST/T Wave abnormalities: nonspecific ST changes  Conduction Disutrbances:none  Narrative Interpretation:   Old EKG Reviewed: unchanged  IV fluids. Serial evaluations unchanged.   MDM   Generalized weakness and elderly adult male. Workup as above including x-ray is concerning for possible infection. IV antibiotics initiated and medicine consultation. Case discussed as above with Dr. Houston Siren who agrees to admission.       Sunnie Nielsen, MD 03/17/12 2312

## 2012-03-18 DIAGNOSIS — J159 Unspecified bacterial pneumonia: Secondary | ICD-10-CM

## 2012-03-18 DIAGNOSIS — E860 Dehydration: Secondary | ICD-10-CM

## 2012-03-18 DIAGNOSIS — R5383 Other fatigue: Secondary | ICD-10-CM

## 2012-03-18 DIAGNOSIS — R5381 Other malaise: Secondary | ICD-10-CM

## 2012-03-18 DIAGNOSIS — I10 Essential (primary) hypertension: Secondary | ICD-10-CM

## 2012-03-18 LAB — CBC
HCT: 38.6 % — ABNORMAL LOW (ref 39.0–52.0)
Hemoglobin: 13.2 g/dL (ref 13.0–17.0)
MCH: 30 pg (ref 26.0–34.0)
MCHC: 34.2 g/dL (ref 30.0–36.0)
MCV: 87.7 fL (ref 78.0–100.0)

## 2012-03-18 LAB — BASIC METABOLIC PANEL
BUN: 15 mg/dL (ref 6–23)
Chloride: 104 mEq/L (ref 96–112)
GFR calc non Af Amer: 79 mL/min — ABNORMAL LOW (ref >90)
Glucose, Bld: 108 mg/dL — ABNORMAL HIGH (ref 70–99)
Potassium: 3.5 mEq/L (ref 3.5–5.1)

## 2012-03-18 LAB — GLUCOSE, CAPILLARY
Glucose-Capillary: 87 mg/dL (ref 70–99)
Glucose-Capillary: 110 mg/dL — ABNORMAL HIGH (ref 70–99)
Glucose-Capillary: 98 mg/dL (ref 70–99)

## 2012-03-18 NOTE — Progress Notes (Signed)
Subjective: States cough better, denies shortness of breath. Still feels very weak. Objective: Vital signs in last 24 hours: Temp:  [96.6 F (35.9 [name])-98 F (36.7 [name])] 97.6 F (36.4 [name]) (06/30 0618) Pulse Rate:  [78-90] 78  (06/30 0618) Resp:  [16-18] 18  (06/30 0618) BP: (126-147)/(73-80) 126/73 mmHg (06/30 0618) SpO2:  [94 %-98 %] 98 % (06/30 0855) Last BM Date: 03/17/12 Intake/Output from previous day: 06/29 0701 - 06/30 0700 In: 3660 [P.O.:960; I.V.:2400; IV Piggyback:300] Out: 400 [Urine:400] Intake/Output this shift:      General Appearance:    Alert, cooperative, no distress, appears stated age  Lungs:     decreased breath sounds in left base, respirations unlabored   Heart:    Regular rate and rhythm, S1 and S2 normal, no murmur, rub   or gallop  Abdomen:     Soft, non-tender, bowel sounds active all four quadrants,    no masses, no organomegaly  Extremities:   Extremities normal, atraumatic, no cyanosis or edema  Neurologic:   CNII-XII intact, normal strength, sensation and reflexes    throughout    Weight change:   Intake/Output Summary (Last 24 hours) at 03/18/12 1042 Last data filed at 03/18/12 0700  Gross per 24 hour  Intake   3660 ml  Output    400 ml  Net   3260 ml    Lab Results:   Basename 03/18/12 0001 03/17/12 0642 03/17/12 0205 03/17/12 0150  NA 136 -- 143 --  K 3.5 -- 4.0 --  CL 104 -- 107 --  CO2 24 -- -- 26  GLUCOSE 108* -- 90 --  BUN 15 -- 27* --  CREATININE 0.97 0.90 -- --  CALCIUM 8.7 -- -- 9.5    Basename 03/18/12 0001 03/17/12 0642  WBC 7.9 8.0  HGB 13.2 14.3  HCT 38.6* 40.7  PLT 205 221  MCV 87.7 87.5   PT/INR No results found for this basename: LABPROT:2,INR:2 in the last 72 hours ABG No results found for this basename: PHART:2,PCO2:2,PO2:2,HCO3:2 in the last 72 hours  Micro Results: No results found for this or any previous visit (from the past 240 hour(s)). Studies/Results: Ct Head Wo Contrast  03/17/2012   *RADIOLOGY REPORT*  Clinical Data: Weakness, dizziness  CT HEAD WITHOUT CONTRAST  Technique:  Contiguous axial images were obtained from the base of the skull through the vertex without contrast.  Comparison: 08/11/2011 CT and MRI  Findings: Mild periventricular and subcortical white matter hypodensities are most in keeping with chronic microangiopathic change. There is no evidence for acute hemorrhage, hydrocephalus, mass lesion, or abnormal extra-axial fluid collection.  No definite CT evidence for acute infarction.  The visualized paranasal sinuses and mastoid air cells are predominately clear.  IMPRESSION: Mild white matter changes.  No definite acute intracranial abnormality.  Original Report Authenticated By: Waneta Martins, M.D.   Dg Chest Portable 1 View  03/17/2012  *RADIOLOGY REPORT*  Clinical Data: Weakness, headache.  PORTABLE CHEST - 1 VIEW  Comparison: 01/17/2012  Findings: Coarse interstitial markings and emphysematous changes, most pronounced on the left.  Prominent cardiomediastinal contours. Increased interstitial markings and lung base opacities.  No pneumothorax.  No definite pleural effusion.  No acute osseous finding.  IMPRESSION: COPD and prominent cardiomediastinal contours are similar to prior.  Increased interstitial prominence superimposed on the chronic changes, may reflect atypical infection or edema.  Bibasilar opacities may reflect atelectasis or pneumonia.  Original Report Authenticated By: Waneta Martins, M.D.   Medications:  Scheduled  Meds:   . albuterol  2.5 mg Nebulization TID  . amLODipine  5 mg Oral Daily  . atorvastatin  20 mg Oral q1800  . azithromycin  500 mg Intravenous Q24H  . cefTRIAXone (ROCEPHIN)  IV  1 g Intravenous QHS  . dextromethorphan-guaiFENesin  1 tablet Oral BID  . enoxaparin  40 mg Subcutaneous Q24H  . Fluticasone-Salmeterol  1 puff Inhalation Q12H  . insulin aspart  0-15 Units Subcutaneous TID WC  . insulin aspart  0-5 Units  Subcutaneous QHS  . ipratropium  0.5 mg Nebulization TID  . naproxen  500 mg Oral BID WC  . sodium chloride  3 mL Intravenous Q12H  . Tamsulosin HCl  0.4 mg Oral QPC breakfast  . DISCONTD: albuterol  2.5 mg Nebulization Q6H   Continuous Infusions:   . 0.9 % NaCl with KCl 20 mEq / L 100 mL/hr at 03/18/12 0621   PRN Meds:.acetaminophen, albuterol, chlorpheniramine-HYDROcodone, HYDROcodone-acetaminophen, morphine injection, ondansetron (ZOFRAN) IV, ondansetron Assessment/Plan: .PNA (pneumonia) -Improving, continue current antibiotics antitussives/expectorants .DIABETES MELLITUS, TYPE II -Blood glucose control mostly less than 100, continue sliding scale coverage  .COPD -Continue bronchodilators, stable  .Dehydration -Resolved with hydration  .Tobacco abuse .HYPERTENSION -Controlled on Norvasc  .HYPERLIPIDEMIA .Weakness generalized  -Consult PT OT     LOS: 2 days   [name] [name] 03/18/2012, 10:42 AM

## 2012-03-19 DIAGNOSIS — J189 Pneumonia, unspecified organism: Principal | ICD-10-CM

## 2012-03-19 DIAGNOSIS — R5383 Other fatigue: Secondary | ICD-10-CM

## 2012-03-19 DIAGNOSIS — J449 Chronic obstructive pulmonary disease, unspecified: Secondary | ICD-10-CM

## 2012-03-19 DIAGNOSIS — I10 Essential (primary) hypertension: Secondary | ICD-10-CM

## 2012-03-19 LAB — GLUCOSE, CAPILLARY: Glucose-Capillary: 89 mg/dL (ref 70–99)

## 2012-03-19 MED ORDER — IPRATROPIUM-ALBUTEROL 18-103 MCG/ACT IN AERO: 2.0000 | INHALATION_SPRAY | Freq: Four times a day (QID) | RESPIRATORY_TRACT | Status: DC

## 2012-03-19 MED ORDER — AZITHROMYCIN 250 MG PO TABS
250.0000 mg | ORAL_TABLET | Freq: Every day | ORAL | Status: DC
Start: 2012-03-19 — End: 2012-03-23

## 2012-03-19 MED ORDER — CEFUROXIME AXETIL 500 MG PO TABS: 500.0000 mg | ORAL_TABLET | Freq: Two times a day (BID) | ORAL | Status: AC

## 2012-03-19 MED ORDER — DM-GUAIFENESIN ER 30-600 MG PO TB12: 1.0000 | ORAL_TABLET | Freq: Two times a day (BID) | ORAL | Status: DC

## 2012-03-19 NOTE — Evaluation (Signed)
Physical Therapy Evaluation Patient Details Name: [name] [name] MRN: [id number] DOB: [birth year] Today's Date: 03/19/2012 Time: 4098-1191 PT Time Calculation (min): 22 min  PT Assessment / Plan / Recommendation Clinical Impression  Pt presents with PNA and generalized weakness.  Tolerated ambulation in hallway and balance activities at stand by assist/min guard level.  Pt will benefit from skilled PT in acute venue to address balance deficits.  PT recommends HHPT for follow up therapy at D/C.  Pt unsure of his insurance coverage.      PT Assessment  Patient needs continued PT services    Follow Up Recommendations  Home health PT    Barriers to Discharge Decreased caregiver support      Equipment Recommendations  None recommended by PT    Recommendations for Other Services     Frequency Min 3X/week    Precautions / Restrictions Restrictions Weight Bearing Restrictions: No   Pertinent Vitals/Pain No pain      Mobility  Bed Mobility Bed Mobility: Supine to Sit;Sitting - Scoot to Edge of Bed Supine to Sit: 6: Modified independent (Device/Increase time) Sitting - Scoot to Edge of Bed: 6: Modified independent (Device/Increase time) Details for Bed Mobility Assistance: requires increased time Transfers Transfers: Sit to Stand;Stand to Sit Sit to Stand: 6: Modified independent (Device/Increase time);With upper extremity assist;From bed Stand to Sit: 6: Modified independent (Device/Increase time);With upper extremity assist;With armrests;To chair/3-in-1 Details for Transfer Assistance: increased time Ambulation/Gait Ambulation/Gait Assistance: 5: Supervision;4: Min guard Ambulation Distance (Feet): 200 Feet Assistive device: None Ambulation/Gait Assistance Details: Pt requires intermittent min/guard for safety/steadying due to minor LOB.  Pt able to recover balance and would sometimes use rail on wall.  Cues provided to not use rails.   Gait Pattern: Step-through pattern Gait  velocity: WFL Stairs: No Wheelchair Mobility Wheelchair Mobility: No    Exercises     PT Diagnosis: Generalized weakness;Abnormality of gait  PT Problem List: Decreased balance;Decreased mobility;Decreased strength PT Treatment Interventions: Gait training;Therapeutic exercise;Balance training;Functional mobility training   PT Goals Acute Rehab PT Goals PT Goal Formulation: With patient Time For Goal Achievement: 03/26/12 Potential to Achieve Goals: Good Pt will Ambulate: >150 feet;Independently;with least restrictive assistive device PT Goal: Ambulate - Progress: Goal set today Pt will Perform Home Exercise Program: Independently PT Goal: Perform Home Exercise Program - Progress: Goal set today Additional Goals Additional Goal #1: Will perform high level balance activities at stand by assist to reduce fall risk.  PT Goal: Additional Goal #1 - Progress: Goal set today  Visit Information  Last PT Received On: 03/19/12 Assistance Needed: +1    Subjective Data  Subjective: Whatever you say Patient Stated Goal: to get back home   Prior Functioning  Home Living Lives With: Alone Type of Home: Other (Comment) (motel) Home Access: Level entry Home Layout: One level Bathroom Shower/Tub: Teacher, adult education: None Prior Function Level of Independence: Independent Driving: Yes Vocation: Retired Musician: No difficulties    Cognition  Overall Cognitive Status: Appears within functional limits for tasks assessed/performed Arousal/Alertness: Awake/alert Orientation Level: Appears intact for tasks assessed Behavior During Session: [facility] for tasks performed    Extremity/Trunk Assessment Right Lower Extremity Assessment RLE ROM/Strength/Tone: WFL for tasks assessed RLE Coordination: WFL - gross motor Left Lower Extremity Assessment LLE ROM/Strength/Tone: WFL for tasks assessed Trunk Assessment Trunk Assessment: Normal   Balance  Balance Balance Assessed: Yes Dynamic Standing Balance Dynamic Standing - Balance Support: No upper extremity supported Dynamic Standing - Level of Assistance: 5:  Stand by assistance;4: Min assist Dynamic Standing - Comments: Had pt perform semi tandem at stand by assist, full tandem could not be completed due to LOB.  Stepping to target x 8 reps at stand by assist for safety.  Pt able to pick object from the floor at stand by assist.  High Level Balance High Level Balance Activites: Head turns High Level Balance Comments: stand by assist required for performing head turns while ambulating.   End of Session PT - End of Session Activity Tolerance: Patient tolerated treatment well Patient left: in chair;with call bell/phone within reach Nurse Communication: Mobility status  GP     Page, Meribeth Mattes 03/19/2012, 10:00 AM

## 2012-03-19 NOTE — Discharge Summary (Addendum)
Discharge Note  Name: [name] [name] MRN: [id number] DOB: [birth year] 78 y.o.  Date of Admission: 03/16/2012 11:19 PM Date of Discharge: 03/19/2012 Attending Physician: No att. providers found  Discharge Diagnosis: Principal Problem:  *PNA (pneumonia) Active Problems:  DIABETES MELLITUS, TYPE II  HYPERLIPIDEMIA  HYPERTENSION  COPD  Dehydration  Tobacco abuse  Weakness generalized   Discharge Medications: Medication List  As of 03/19/2012  4:35 PM   TAKE these medications         albuterol-ipratropium 18-103 MCG/ACT inhaler   Commonly known as: COMBIVENT   Inhale 2 puffs into the lungs 4 (four) times daily.      amLODipine 5 MG tablet   Commonly known as: NORVASC   Take 5 mg by mouth daily.      atorvastatin 20 MG tablet   Commonly known as: LIPITOR   Take 1 tablet (20 mg total) by mouth daily at 6 PM.      azithromycin 250 MG tablet   Commonly known as: ZITHROMAX   Take 1 tablet (250 mg total) by mouth daily. For 2 more days      cefUROXime 500 MG tablet   Commonly known as: CEFTIN   Take 1 tablet (500 mg total) by mouth 2 (two) times daily.      dextromethorphan-guaiFENesin 30-600 MG per 12 hr tablet   Commonly known as: MUCINEX DM   Take 1 tablet by mouth 2 (two) times daily.      Fluticasone-Salmeterol 100-50 MCG/DOSE Aepb   Commonly known as: ADVAIR   Inhale 1 puff into the lungs every 12 (twelve) hours.      naproxen sodium 220 MG tablet   Commonly known as: ANAPROX   Take 440 mg by mouth 2 (two) times daily with a meal. For headaches      Tamsulosin HCl 0.4 MG Caps   Commonly known as: FLOMAX   Take 1 capsule (0.4 mg total) by mouth daily after breakfast.            Disposition and follow-up:   [name] [name] was discharged from [facility] in improved/stable condition.    Follow-up Appointments: Discharge Orders    Future Orders Please Complete By Expires   Diet Carb Modified         Consultations:    Procedures  Performed:  Ct Head Wo Contrast  03/17/2012  *RADIOLOGY REPORT*  Clinical Data: Weakness, dizziness  CT HEAD WITHOUT CONTRAST  Technique:  Contiguous axial images were obtained from the base of the skull through the vertex without contrast.  Comparison: 08/11/2011 CT and MRI  Findings: Mild periventricular and subcortical white matter hypodensities are most in keeping with chronic microangiopathic change. There is no evidence for acute hemorrhage, hydrocephalus, mass lesion, or abnormal extra-axial fluid collection.  No definite CT evidence for acute infarction.  The visualized paranasal sinuses and mastoid air cells are predominately clear.  IMPRESSION: Mild white matter changes.  No definite acute intracranial abnormality.  Original Report Authenticated By: Waneta Martins, M.D.   Dg Chest Portable 1 View  03/17/2012  *RADIOLOGY REPORT*  Clinical Data: Weakness, headache.  PORTABLE CHEST - 1 VIEW  Comparison: 01/17/2012  Findings: Coarse interstitial markings and emphysematous changes, most pronounced on the left.  Prominent cardiomediastinal contours. Increased interstitial markings and lung base opacities.  No pneumothorax.  No definite pleural effusion.  No acute osseous finding.  IMPRESSION: COPD and prominent cardiomediastinal contours are similar to prior.  Increased interstitial prominence superimposed on the  chronic changes, may reflect atypical infection or edema.  Bibasilar opacities may reflect atelectasis or pneumonia.  Original Report Authenticated By: Waneta Martins, M.D.    Admission HPI [name] [name] is an 78 y.o. male with type 2 diabetes, bullous emphysema, COPD, asthma, GERD, hyperlipidemia, anxiety and depression, presents to the emergency room as he has been feeling weak for the past 2 days. He can hardly walk. He has not been taking adequate amount of by mouth. He also has been coughing green sputum, but denied shortness of breath, chest pain, fever or chills. Evaluation in  emergency room included a negative head CT as he was complaining of headache, chest x-ray shows increased density which could represent pneumonia, and bibasilar opacifications consistent with either atelectasis or infiltrate. He has a mild leukocytosis with white count of 11.3 thousand, hemoglobin of 15.5, unremarkable electrolytes, BUN of 27 and creatinine of 1.3. His blood glucose and calcium son normal as well. Hospitalist was asked to admit patient because of volume depletion, community acquired pneumonia, and generalized weakness Exam General Appearance:  Alert, cooperative, no distress, appears stated age   Lungs:   clear, no wheezes,respirations unlabored   Heart:  Regular rate and rhythm, S1 and S2 normal, no murmur, rub  or gallop   Abdomen:  Soft, non-tender, bowel sounds active all four quadrants,  no masses, no organomegaly   Extremities:  Extremities normal, atraumatic, no cyanosis or edema   Neurologic:  CNII-XII intact, normal strength, sensation and reflexes  throughout      Hospital Course by problem list: Principal Problem:  *PNA (pneumonia) Active Problems:  DIABETES MELLITUS, TYPE II  HYPERLIPIDEMIA  HYPERTENSION  COPD  Dehydration  Tobacco abuse  Weakness generalized .PNA (pneumonia)  Upon admission a chest x-ray was done she was consistent with a pneumonia.He was was started on empiric antibiotics with Zithromax and Rocephin. antitussives/expectorants were also added.  His cough and weakness improved. His been oxygenating well on room air. He is clinically improved at this time, his remaining afebrile hemodynamically stable with no leukocytosis and he will be discharged to follow up outpatient. Marland KitchenDIABETES MELLITUS, TYPE II  Blood glucose remaining controlled the hospital-he was covered with sliding scale insulin. It was noted that he is on no medications outpatient.his to continue modified carbohydrate/diabetic diet upon discharge and followup with his PCP for  continued monitoring of his blood sugars and management as appropriate  .COPD  -Remained stable during this hospital stay, and discharged on dilators and is to follow up outpatient. .Dehydration  -Resolved with hydration  .HYPERTENSION  -Controlled on Norvasc, and he is to continue it upon discharge.  Marland KitchenHYPERLIPIDEMIA .Weakness generalized  Improved during this hospital stay, PT OT was consulted and they recommended home health PT.   Discharge Vitals:  BP 148/80  Pulse 87  Temp 97.4 F (36.3 C) (Oral)  Resp 18  Ht 5\' 10"  (1.778 m)  Wt 73.755 kg (162 lb 9.6 oz)  BMI 23.33 kg/m2  SpO2 92%  Discharge Labs:  Results for orders placed during the hospital encounter of 03/16/12 (from the past 24 hour(s))  GLUCOSE, CAPILLARY     Status: Abnormal   Collection Time   03/18/12  4:49 PM      Component Value Range   Glucose-Capillary 110 (*) 70 - 99 mg/dL  GLUCOSE, CAPILLARY     Status: Normal   Collection Time   03/18/12  9:43 PM      Component Value Range   Glucose-Capillary 98  70 -  99 mg/dL   Comment 1 Notify RN    GLUCOSE, CAPILLARY     Status: Normal   Collection Time   03/19/12  7:15 AM      Component Value Range   Glucose-Capillary 87  70 - 99 mg/dL  GLUCOSE, CAPILLARY     Status: Normal   Collection Time   03/19/12 11:50 AM      Component Value Range   Glucose-Capillary 89  70 - 99 mg/dL   Comment 1 Notify RN      SignedKela Millin 03/19/2012, 4:35 PM   Time spent co-ordinating d/c ->25mins

## 2012-03-19 NOTE — Care Management Note (Signed)
    Page 1 of 1   03/19/2012     11:24:09 AM   CARE MANAGEMENT NOTE 03/19/2012  Patient:  [name] [name], [name] [name]   Account Number:  [id number]  Date Initiated:  03/19/2012  Documentation initiated by:  Lanier Clam  Subjective/Objective Assessment:   ADMITTED W/SOB.     Action/Plan:   FROM HOME ALONE   Anticipated DC Date:  03/19/2012   Anticipated DC Plan:  HOME W HOME HEALTH SERVICES  In-house referral  Clinical Social Worker      DC Planning Services  CM consult      Choice offered to / List presented to:  C-1 Patient        HH arranged  HH-2 PT      [facility] agency  [facility]   Status of service:  Completed, signed off Medicare Important Message given?   (If response is "NO", the following Medicare IM given date fields will be blank) Date Medicare IM given:   Date Additional Medicare IM given:    Discharge Disposition:  HOME W HOME HEALTH SERVICES  Per UR Regulation:  Reviewed for med. necessity/level of care/duration of stay  If discussed at Long Length of Stay Meetings, dates discussed:    Comments:  03/19/12 [name] Segall RN,BSN NCM [phone] SPOKE TO PATIENT ABOUT HH.CONCERNED THAT INSURANCE WILL NOT COVER HH,HE HAD THIS CONCERN IN THE PAST.PER OUR ADMITTING DEPT THEY HAVE GENERIC MEDICARE ADVANTAGE.[facility] CHOSEN FOR HHPN/PT,THEY HAVE CHECKED ABOUT BENEFITS-PATIENT HAS 100% COVERAGE FOR HOME CARE VISITS.PATIENT INFORME.PATIENT STATES HE LIVES ALONE,HAS NO ONE TO PICK HIM UP,& NO MONEY HERE IN HOSPITAL FOR TRANSPORTATION.SW MADE AWARE,WHO WILL TALK TO PATIENT.

## 2012-03-19 NOTE — Evaluation (Signed)
Occupational Therapy Evaluation Patient Details Name: [name] [name] MRN: [id number] DOB: March 02, 1934 Today's Date: 03/19/2012 Time: 4098-1191 OT Time Calculation (min): 23 min  OT Assessment / Plan / Recommendation Clinical Impression  Pt presents to OT at close to baseline with ADL activity . Pt does need further acute OT. Pt would benefit from [facility] to evaluate pt in home environment and assess safety. Pt did verbalize fear of falling.     OT Assessment  All further OT needs can be met in the next venue of care          Equipment Recommendations  None recommended by OT          Precautions / Restrictions Restrictions Weight Bearing Restrictions: No       ADL  Eating/Feeding: Performed;Independent Where Assessed - Eating/Feeding: Edge of bed Grooming: Performed;Wash/dry face Where Assessed - Grooming: Unsupported standing Upper Body Bathing: Simulated;Independent Where Assessed - Upper Body Bathing: Unsupported sit to stand Lower Body Bathing: Simulated;Independent Where Assessed - Lower Body Bathing: Unsupported sitting;Unsupported sit to stand Upper Body Dressing: Simulated;Independent Where Assessed - Upper Body Dressing: Unsupported sit to stand Lower Body Dressing: Simulated;Independent Where Assessed - Lower Body Dressing: Unsupported sit to stand Toilet Transfer: Performed;Independent Toilet Transfer Method: Sit to Barista: Comfort height toilet Toileting - Clothing Manipulation and Hygiene: Simulated;Independent Where Assessed - Toileting Clothing Manipulation and Hygiene: Standing Tub/Shower Transfer: Landscape architect Method: Science writer: Grab bars Transfers/Ambulation Related to ADLs: Pt I with ambulation going to bathroom    OT Treatment Interventions: Self-care/ADL training;Patient/family education       Visit Information  Last OT Received On: 03/19/12 Assistance Needed:  +1       Prior Functioning  Home Living Lives With: Alone Available Help at Discharge: Other (Comment) (reports no one to help at home) Type of Home: Other (Comment) (motel) Home Access: Level entry Home Layout: One level Bathroom Shower/Tub: Network engineer: None Prior Function Level of Independence: Independent Driving: Yes Vocation: Retired Musician: No difficulties    Cognition  Overall Cognitive Status: Appears within functional limits for tasks assessed/performed Arousal/Alertness: Awake/alert Orientation Level: Appears intact for tasks assessed Behavior During Session: [facility] for tasks performed    Extremity/Trunk Assessment Right Upper Extremity Assessment RUE ROM/Strength/Tone: [facility] for tasks assessed Left Upper Extremity Assessment LUE ROM/Strength/Tone: WFL for tasks assessed Right Lower Extremity Assessment RLE ROM/Strength/Tone: WFL for tasks assessed RLE Coordination: WFL - gross motor Left Lower Extremity Assessment LLE ROM/Strength/Tone: WFL for tasks assessed Trunk Assessment Trunk Assessment: Normal   Mobility Bed Mobility Bed Mobility: Supine to Sit;Sitting - Scoot to Edge of Bed Supine to Sit: 6: Modified independent (Device/Increase time) Sitting - Scoot to Edge of Bed: 6: Modified independent (Device/Increase time) Details for Bed Mobility Assistance: requires increased time Transfers Sit to Stand: 7: Independent Stand to Sit: 7: Independent Details for Transfer Assistance: increased time      Balance Balance Balance Assessed: Yes Dynamic Standing Balance Dynamic Standing - Balance Support: No upper extremity supported Dynamic Standing - Level of Assistance: 5: Stand by assistance;4: Min assist Dynamic Standing - Comments: Had pt perform semi tandem at stand by assist, full tandem could not be completed due to LOB.  Stepping to target x 8 reps at stand by assist for safety.  Pt  able to pick object from the floor at stand by assist.  High Level Balance High Level Balance Activites: Head turns High Level Balance Comments:  stand by assist required for performing head turns while ambulating.   End of Session OT - End of Session Activity Tolerance: Patient tolerated treatment well Patient left: in chair       Tamarah Bhullar, Metro Kung 03/19/2012, 10:22 AM

## 2012-03-23 ENCOUNTER — Emergency Department (HOSPITAL_COMMUNITY): Payer: Medicare (Managed Care)

## 2012-03-23 ENCOUNTER — Encounter (HOSPITAL_COMMUNITY): Payer: Self-pay | Admitting: *Deleted

## 2012-03-23 ENCOUNTER — Emergency Department (HOSPITAL_COMMUNITY)
Admission: EM | Admit: 2012-03-23 | Discharge: 2012-03-23 | Disposition: A | Discharge status: Home / Self Care | Payer: Medicare (Managed Care) | Attending: Emergency Medicine | Admitting: Emergency Medicine

## 2012-03-23 DIAGNOSIS — R5383 Other fatigue: Secondary | ICD-10-CM | POA: Insufficient documentation

## 2012-03-23 DIAGNOSIS — R0602 Shortness of breath: Secondary | ICD-10-CM | POA: Insufficient documentation

## 2012-03-23 DIAGNOSIS — R1110 Vomiting, unspecified: Secondary | ICD-10-CM | POA: Insufficient documentation

## 2012-03-23 DIAGNOSIS — J438 Other emphysema: Secondary | ICD-10-CM | POA: Insufficient documentation

## 2012-03-23 DIAGNOSIS — I10 Essential (primary) hypertension: Secondary | ICD-10-CM | POA: Insufficient documentation

## 2012-03-23 DIAGNOSIS — E119 Type 2 diabetes mellitus without complications: Secondary | ICD-10-CM | POA: Insufficient documentation

## 2012-03-23 DIAGNOSIS — R5381 Other malaise: Secondary | ICD-10-CM | POA: Insufficient documentation

## 2012-03-23 HISTORY — DX: Pneumonia, unspecified organism: J18.9

## 2012-03-23 LAB — BLOOD GAS, ARTERIAL
Bicarbonate: 23.1 mEq/L (ref 20.0–24.0)
FIO2: 0.21 %
Patient temperature: 98.6
TCO2: 19.7 mmol/L (ref 0–100)
pCO2 arterial: 28.2 mmHg — ABNORMAL LOW (ref 35.0–45.0)
pH, Arterial: 7.524 — ABNORMAL HIGH (ref 7.350–7.450)

## 2012-03-23 LAB — BASIC METABOLIC PANEL
BUN: 15 mg/dL (ref 6–23)
Chloride: 104 mEq/L (ref 96–112)
Glucose, Bld: 85 mg/dL (ref 70–99)
Potassium: 3.9 mEq/L (ref 3.5–5.1)
Sodium: 139 mEq/L (ref 135–145)

## 2012-03-23 LAB — CBC
Hemoglobin: 15.8 g/dL (ref 13.0–17.0)
MCH: 30.9 pg (ref 26.0–34.0)
Platelets: 294 10*3/uL (ref 150–400)
RBC: 5.12 MIL/uL (ref 4.22–5.81)
WBC: 9.6 10*3/uL (ref 4.0–10.5)

## 2012-03-23 LAB — DIFFERENTIAL
Lymphocytes Relative: 24 % (ref 12–46)
Lymphs Abs: 2.3 10*3/uL (ref 0.7–4.0)
Monocytes Relative: 8 % (ref 3–12)
Neutro Abs: 6.1 10*3/uL (ref 1.7–7.7)
Neutrophils Relative %: 64 % (ref 43–77)

## 2012-03-23 LAB — HEPATIC FUNCTION PANEL
ALT: 14 U/L (ref 0–53)
AST: 18 U/L (ref 0–37)
Bilirubin, Direct: <0.1 mg/dL (ref 0.0–0.3)
Total Protein: 7.5 g/dL (ref 6.0–8.3)

## 2012-03-23 MED ORDER — SODIUM CHLORIDE 0.9 % IV SOLN
20.0000 mL | INTRAVENOUS | Status: DC
  Administered 2012-03-23 (×2): 20 mL via INTRAVENOUS

## 2012-03-23 MED ORDER — ONDANSETRON 4 MG PO TBDP: 4.0000 mg | ORAL_TABLET | Freq: Three times a day (TID) | ORAL | Status: AC | PRN

## 2012-03-23 NOTE — ED Notes (Signed)
Paged respiratory for ABG

## 2012-03-23 NOTE — ED Provider Notes (Signed)
History     CSN: [id number]  Arrival date & time 03/23/12  1925   First MD Initiated Contact with Patient 03/23/12 2013      Chief Complaint  Patient presents with  . Weakness  . Emesis    (Consider location/radiation/quality/duration/timing/severity/associated sxs/prior treatment) Patient is a 78 y.o. male presenting with weakness and vomiting. The history is provided by the patient (pt states he has been weak and vomited).  Weakness The primary symptoms include vomiting. Primary symptoms do not include headaches or seizures. The symptoms began 12 to 24 hours ago. The symptoms are improving. The neurological symptoms are multifocal. Context: nothing.  Additional symptoms include weakness. Additional symptoms do not include hallucinations.  Emesis  Pertinent negatives include no abdominal pain, no cough, no diarrhea and no headaches.    Past Medical History  Diagnosis Date  . DIABETES MELLITUS, TYPE II 11/03/2009  . HYPERLIPIDEMIA 11/03/2009  . ANXIETY 11/03/2009  . DEPRESSION 11/03/2009  . RESTLESS LEG SYNDROME 11/03/2009  . HYPERTENSION 11/03/2009  . CHRONIC OBSTRUCTIVE PULMONARY DISEASE, ACUTE EXACERBATION 11/03/2009  . EMPHYSEMA, BULLOUS 11/03/2009  . ASTHMA 11/03/2009  . COPD 11/03/2009  . GERD 11/03/2009  . PEPTIC ULCER DISEASE 11/03/2009  . ABSCESS, FINGER 04/07/2010  . ABSCESS 12/03/2009  . DISC DISEASE, LUMBAR 11/03/2009  . SPINAL STENOSIS, LUMBAR 11/03/2009  . RASH-NONVESICULAR 11/03/2009  . Kidney stones 01/30/12    "I've had them 7 times; always have passed them"  . Shortness of breath     "sometimes; at any time"  . Pneumonia     Past Surgical History  Procedure Date  . Rotator cuff repair 2003    left  . Tonsillectomy 1960  . Foot neuroma surgery 1982    left  . Inguinal hernia repair 10/2011    left    Family History  Problem Relation Age of Onset  . Heart disease Mother   . Cancer Brother     lung    History  Substance Use Topics  . Smoking status:  Current Everyday Smoker -- 1.0 packs/day for 41 years    Types: Cigarettes  . Smokeless tobacco: Never Used   Comment: "stopped smoking 04/21/1991 then restarted in 2012"  . Alcohol Use: Yes     01/30/12 "might go a year or 2 then drink a couple beers"      Review of Systems  Constitutional: Positive for fatigue.  HENT: Negative for congestion, sinus pressure and ear discharge.   Eyes: Negative for discharge.  Respiratory: Negative for cough.   Cardiovascular: Negative for chest pain.  Gastrointestinal: Positive for vomiting. Negative for abdominal pain and diarrhea.  Genitourinary: Negative for frequency and hematuria.  Musculoskeletal: Negative for back pain.  Skin: Negative for rash.  Neurological: Positive for weakness. Negative for seizures and headaches.  Hematological: Negative.   Psychiatric/Behavioral: Negative for hallucinations.    Allergies  Review of patient's allergies indicates no known allergies.  Home Medications   Current Outpatient Rx  Name Route Sig Dispense Refill  . IPRATROPIUM-ALBUTEROL 18-103 MCG/ACT IN AERO Inhalation Inhale 2 puffs into the lungs 4 (four) times daily. 1 Inhaler 0  . AMLODIPINE BESYLATE 5 MG PO TABS Oral Take 5 mg by mouth daily.    . ATORVASTATIN CALCIUM 20 MG PO TABS Oral Take 1 tablet (20 mg total) by mouth daily at 6 PM. 30 tablet 0  . CEFUROXIME AXETIL 500 MG PO TABS Oral Take 1 tablet (500 mg total) by mouth 2 (two) times daily. 14 tablet 0  .  FLUTICASONE-SALMETEROL 100-50 MCG/DOSE IN AEPB Inhalation Inhale 1 puff into the lungs every 12 (twelve) hours. 60 each 0  . NAPROXEN SODIUM 220 MG PO TABS Oral Take 440 mg by mouth 2 (two) times daily with a meal. For headaches    . TAMSULOSIN HCL 0.4 MG PO CAPS Oral Take 1 capsule (0.4 mg total) by mouth daily after breakfast. 30 capsule 0  . ONDANSETRON 4 MG PO TBDP Oral Take 1 tablet (4 mg total) by mouth every 8 (eight) hours as needed for nausea. 20 tablet 0    BP 134/69  Pulse 95   Temp 98.3 F (36.8 C) (Oral)  Resp 16  Ht 5\' 10"  (1.778 m)  Wt 164 lb (74.39 kg)  BMI 23.53 kg/m2  SpO2 96%  Physical Exam  Constitutional: He is oriented to person, place, and time. He appears well-developed.  HENT:  Head: Normocephalic and atraumatic.  Eyes: Conjunctivae and EOM are normal. No scleral icterus.  Neck: Neck supple. No thyromegaly present.  Cardiovascular: Normal rate and regular rhythm.  Exam reveals no gallop and no friction rub.   No murmur heard. Pulmonary/Chest: No stridor. He has no wheezes. He has no rales. He exhibits no tenderness.  Abdominal: He exhibits no distension. There is no tenderness. There is no rebound.  Musculoskeletal: Normal range of motion. He exhibits no edema.  Lymphadenopathy:    He has no cervical adenopathy.  Neurological: He is oriented to person, place, and time. Coordination normal.  Skin: No rash noted. No erythema.  Psychiatric: He has a normal mood and affect. His behavior is normal.    ED Course  Procedures (including critical care time)  Labs Reviewed  BASIC METABOLIC PANEL - Abnormal; Notable for the following:    GFR calc non Af Amer 81 (*)     All other components within normal limits  BLOOD GAS, ARTERIAL - Abnormal; Notable for the following:    pH, Arterial 7.524 (*)     pCO2 arterial 28.2 (*)     pO2, Arterial 116.0 (*)     All other components within normal limits  CBC  DIFFERENTIAL  LACTIC ACID, PLASMA  HEPATIC FUNCTION PANEL   Dg Chest 2 View  03/23/2012  *RADIOLOGY REPORT*  Clinical Data: Cough.  Shortness of breath.  The patient currently being treated for pneumonia.  Generalized weakness.  Nausea and vomiting.  History of COPD.  CHEST - 2 VIEW  Comparison: Portable chest x-ray 03/17/2012.  Two-view chest x-ray 12/31/2011, 04/23/2011.  Findings: Cardiac silhouette upper normal in size for this degree of hyperinflation, unchanged.  Thoracic aorta mildly tortuous and atherosclerotic, unchanged.  Large central  pulmonary arteries, unchanged.  Severe bullous emphysematous changes in the left upper lobe, with scattered emphysematous changes elsewhere throughout both lungs.  Interval resolution of the patchy airspace opacities in both lungs since the examination 6 days ago.  Prominent bronchovascular markings diffusely and central peribronchial thickening now similar to the prior examination in April, 2013. Blunting of the costophrenic angles bilaterally is unchanged and consistent with chronic pleural scarring.  No new pulmonary parenchymal abnormalities.  No pleural effusions.  Degenerative changes involving the thoracic spine.  IMPRESSION: Severe COPD/emphysema.  Interval resolution of the edema and/or pneumonia in both lungs since the examination 6 days ago.  No acute cardiopulmonary disease currently.  Original Report Authenticated By: Arnell Sieving, M.D.     1. Vomiting       MDM          Jomarie Longs  Purnell Shoemaker, MD 03/23/12 2201

## 2012-03-23 NOTE — ED Notes (Signed)
Pt c/o worsening weaknss, cough, and vomiting prior to arrival.

## 2012-03-23 NOTE — ED Notes (Signed)
Respiratory in with pt

## 2012-04-28 LAB — COMPREHENSIVE METABOLIC PANEL
Albumin: 3.4 g/dL (ref 3.4–5.0)
Alkaline Phosphatase: 92 U/L (ref 50–136)
Bilirubin,Total: 0.5 mg/dL (ref 0.2–1.0)
Chloride: 108 mmol/L — ABNORMAL HIGH (ref 98–107)
Co2: 25 mmol/L (ref 21–32)
EGFR (Non-African Amer.): >60
Glucose: 107 mg/dL — ABNORMAL HIGH (ref 65–99)
Osmolality: 286 (ref 275–301)
Potassium: 3.5 mmol/L (ref 3.5–5.1)
Sodium: 143 mmol/L (ref 136–145)
Total Protein: 7.1 g/dL (ref 6.4–8.2)

## 2012-04-28 LAB — URINALYSIS, COMPLETE
Blood: NEGATIVE
Glucose,UR: NEGATIVE mg/dL (ref 0–75)
Nitrite: NEGATIVE
Ph: 5 (ref 4.5–8.0)
Protein: NEGATIVE
RBC,UR: 2 /HPF (ref 0–5)

## 2012-04-28 LAB — CBC
Platelet: 251 10*3/uL (ref 150–440)
RBC: 5.18 10*6/uL (ref 4.40–5.90)
WBC: 8.5 10*3/uL (ref 3.8–10.6)

## 2012-04-28 LAB — CK TOTAL AND CKMB (NOT AT ARMC): CK-MB: <0.5 ng/mL — ABNORMAL LOW (ref 0.5–3.6)

## 2012-04-29 LAB — BASIC METABOLIC PANEL
BUN: 13 mg/dL (ref 7–18)
Chloride: 107 mmol/L (ref 98–107)
Co2: 29 mmol/L (ref 21–32)
Creatinine: 0.86 mg/dL (ref 0.60–1.30)
Potassium: 3.9 mmol/L (ref 3.5–5.1)
Sodium: 141 mmol/L (ref 136–145)

## 2012-04-29 LAB — CK TOTAL AND CKMB (NOT AT ARMC)
CK-MB: <0.5 ng/mL — ABNORMAL LOW (ref 0.5–3.6)
CK-MB: <0.5 ng/mL — ABNORMAL LOW (ref 0.5–3.6)

## 2012-04-29 LAB — LIPID PANEL
Cholesterol: 176 mg/dL (ref 0–200)
Ldl Cholesterol, Calc: 90 mg/dL (ref 0–100)
VLDL Cholesterol, Calc: 46 mg/dL — ABNORMAL HIGH (ref 5–40)

## 2012-04-30 ENCOUNTER — Inpatient Hospital Stay: Payer: Self-pay | Admitting: Internal Medicine

## 2012-04-30 LAB — URINE CULTURE

## 2012-05-01 LAB — BASIC METABOLIC PANEL
Calcium, Total: 8.7 mg/dL (ref 8.5–10.1)
Co2: 28 mmol/L (ref 21–32)
EGFR (Non-African Amer.): >60
Glucose: 101 mg/dL — ABNORMAL HIGH (ref 65–99)
Osmolality: 278 (ref 275–301)
Potassium: 4.2 mmol/L (ref 3.5–5.1)

## 2012-05-01 LAB — CBC WITH DIFFERENTIAL/PLATELET
Basophil #: 0.1 10*3/uL (ref 0.0–0.1)
Basophil %: 1 %
Eosinophil #: 0.3 10*3/uL (ref 0.0–0.7)
HGB: 16.1 g/dL (ref 13.0–18.0)
Lymphocyte %: 28 %
MCHC: 34 g/dL (ref 32.0–36.0)
Neutrophil %: 60.8 %

## 2012-05-15 ENCOUNTER — Observation Stay: Payer: Self-pay | Admitting: Internal Medicine

## 2012-05-15 LAB — URINALYSIS, COMPLETE
Bilirubin,UR: NEGATIVE
Glucose,UR: NEGATIVE mg/dL (ref 0–75)
Ketone: NEGATIVE
Leukocyte Esterase: NEGATIVE
Nitrite: NEGATIVE
Protein: NEGATIVE

## 2012-05-15 LAB — COMPREHENSIVE METABOLIC PANEL
Alkaline Phosphatase: 89 U/L (ref 50–136)
Bilirubin,Total: 0.8 mg/dL (ref 0.2–1.0)
Creatinine: 1.09 mg/dL (ref 0.60–1.30)
SGPT (ALT): 17 U/L (ref 12–78)
Total Protein: 6.5 g/dL (ref 6.4–8.2)

## 2012-05-15 LAB — CBC WITH DIFFERENTIAL/PLATELET
Basophil %: 0.8 %
Eosinophil %: 3.8 %
HGB: 15.6 g/dL (ref 13.0–18.0)
MCH: 31.4 pg (ref 26.0–34.0)
Monocyte #: 0.6 x10 3/mm (ref 0.2–1.0)
Neutrophil %: 69 %
RBC: 4.97 10*6/uL (ref 4.40–5.90)

## 2012-05-16 ENCOUNTER — Ambulatory Visit: Payer: Self-pay | Admitting: Neurology

## 2012-05-16 LAB — COMPREHENSIVE METABOLIC PANEL
Albumin: 3 g/dL — ABNORMAL LOW (ref 3.4–5.0)
Calcium, Total: 8.3 mg/dL — ABNORMAL LOW (ref 8.5–10.1)
Co2: 30 mmol/L (ref 21–32)
Creatinine: 1.24 mg/dL (ref 0.60–1.30)
EGFR (Non-African Amer.): 56 — ABNORMAL LOW
Glucose: 126 mg/dL — ABNORMAL HIGH (ref 65–99)
Potassium: 3.9 mmol/L (ref 3.5–5.1)
Sodium: 141 mmol/L (ref 136–145)

## 2012-05-16 LAB — CBC WITH DIFFERENTIAL/PLATELET
Basophil #: 0.1 10*3/uL (ref 0.0–0.1)
Basophil %: 1.3 %
Eosinophil %: 4.1 %
HCT: 43.9 % (ref 40.0–52.0)
HGB: 14.6 g/dL (ref 13.0–18.0)
MCH: 30.6 pg (ref 26.0–34.0)
MCHC: 33.4 g/dL (ref 32.0–36.0)
MCV: 92 fL (ref 80–100)

## 2012-05-16 LAB — TROPONIN I: Troponin-I: <0.02 ng/mL

## 2012-05-17 ENCOUNTER — Ambulatory Visit: Payer: Self-pay | Admitting: Neurology

## 2012-05-21 DIAGNOSIS — R55 Syncope and collapse: Secondary | ICD-10-CM

## 2012-05-22 DIAGNOSIS — R55 Syncope and collapse: Secondary | ICD-10-CM

## 2012-05-24 ENCOUNTER — Observation Stay: Payer: Self-pay | Admitting: Student

## 2012-05-24 LAB — CBC WITH DIFFERENTIAL/PLATELET
Basophil #: 0 10*3/uL (ref 0.0–0.1)
Eosinophil #: 0.2 10*3/uL (ref 0.0–0.7)
Lymphocyte %: 12.8 %
MCHC: 33.8 g/dL (ref 32.0–36.0)
Monocyte #: 0.2 x10 3/mm (ref 0.2–1.0)
Neutrophil %: 81.3 %
Platelet: 237 10*3/uL (ref 150–440)
RDW: 13.9 % (ref 11.5–14.5)

## 2012-05-24 LAB — COMPREHENSIVE METABOLIC PANEL
Albumin: 3.3 g/dL — ABNORMAL LOW (ref 3.4–5.0)
Anion Gap: 8 (ref 7–16)
BUN: 18 mg/dL (ref 7–18)
Calcium, Total: 8.4 mg/dL — ABNORMAL LOW (ref 8.5–10.1)
Creatinine: 1.14 mg/dL (ref 0.60–1.30)
Glucose: 221 mg/dL — ABNORMAL HIGH (ref 65–99)
Potassium: 3.1 mmol/L — ABNORMAL LOW (ref 3.5–5.1)
SGOT(AST): 11 U/L — ABNORMAL LOW (ref 15–37)
SGPT (ALT): 15 U/L (ref 12–78)
Total Protein: 6.9 g/dL (ref 6.4–8.2)

## 2012-05-25 LAB — HEPATIC FUNCTION PANEL A (ARMC)
Albumin: 3.1 g/dL — ABNORMAL LOW (ref 3.4–5.0)
Alkaline Phosphatase: 82 U/L (ref 50–136)
Bilirubin,Total: 0.8 mg/dL (ref 0.2–1.0)
SGOT(AST): 8 U/L — ABNORMAL LOW (ref 15–37)
SGPT (ALT): 13 U/L (ref 12–78)
Total Protein: 6.5 g/dL (ref 6.4–8.2)

## 2012-05-25 LAB — BASIC METABOLIC PANEL
Calcium, Total: 8.9 mg/dL (ref 8.5–10.1)
Creatinine: 0.95 mg/dL (ref 0.60–1.30)
EGFR (Non-African Amer.): >60
Potassium: 4.2 mmol/L (ref 3.5–5.1)

## 2015-01-06 NOTE — Discharge Summary (Signed)
PATIENT NAME:  [name] [name], [name] [name] MR#:  [id number] DATE OF BIRTH:  [birth year]  DATE OF ADMISSION:  05/24/2012 DATE OF DISCHARGE:  05/25/2012  Patient walked out [place] on 05/25/2012.   CHIEF COMPLAINT: Recurrent syncope.   DISCHARGE DIAGNOSES:  1. Recurrent syncope in the setting of orthostatic hypotension. 2. History of hypertension.  3. History of obstructive sleep apnea.  4. History of recurrent syncope secondary to orthostatic hypotension.  5. History of coronary artery disease seen in the cardiac catheterization done last month showing 60% mid LAD lesion and distal small vessel lesion at 30%.   CURRENT MEDICATIONS:  1. Florinef 0.1 mg daily p.o.  2. Metoprolol 12.5 mg p.o. b.i.d.  3. Plavix 75 mg daily.  4. Lovenox 40 mg subcutaneous daily. 5. Sliding scale insulin.   DISPOSITION: Patient is going home [place].   HISTORY OF PRESENT ILLNESS AND HOSPITAL COURSE: For full details of the history and physical, please see the dictation on 05/24/2012 by Dr. Posey Pronto, but briefly this is a 81 year old elderly male with history of recurrent syncope with hospitalization August 27 where extensive work-up was done including an echocardiogram and a MRI as well as a carotid ultrasound and EEG. At the time was discharged and symptoms were attributed to orthostatic hypotension as that was found and patient was given compression stocking but patient is noncompliant and yesterday came back with recurrent syncope. Patient did have significant orthostatic hypotension in the ER as well. Patient received IV fluids and was admitted to the hospitalist service on telemetry. He did have some mild hypokalemia, potassium 3.1 yesterday. Negative troponin and no significant leukocytosis. CT of the head was also done showing no evidence of acute ischemic or hemorrhagic event. There is some mild age-related atrophic changes present which appears to be stable. No mass effect nor hydrocephalus. On  rhythm strip per telemetry tech there was no significant arrhythmia either this morning. Patient actually took off his monitor and left AGAINST MEDICAL ADVICE. I attempted to discuss with the patient several times but he did not want to stay. He stated that a family event, which was private, was pending and he needed to go out. It was explained to him that the work-up was not done and he could syncopize again having a fracture, bleeding in his head, and even death. He is aware of all these consequences potentially but still signed [place] paperwork. I counseled the patient that he should rise slowly and I gave him a prescription for Florinef. He states that he has an appointment with a cardiologist on the 10th of this month which he will follow up. Furthermore, I gave him a prescription for the half dose of metoprolol and told him if his heart rate is less than 60 or blood pressure systolic is less than 099 for him to hold dose and recheck. Again, patient is walking out [place] before full workup is completed at this point.  TOTAL TIME SPENT: 35 minutes.   CODE STATUS: Patient is FULL CODE.  ____________________________ Vivien Presto, MD sa:cms D: 05/25/2012 12:47:03 ET T: 05/28/2012 11:12:54 ET JOB#: [id number]  cc: Vivien Presto, MD, <Dictator> Dionisio David, MD Vivien Presto MD ELECTRONICALLY SIGNED 06/08/2012 14:20

## 2015-01-06 NOTE — H&P (Signed)
PATIENT NAME:  [name] [name], [name] [name] MR#:  [id number] DATE OF BIRTH:  [birth year]  DATE OF ADMISSION:  05/24/2012  PRIMARY CARE PHYSICIAN: None.   ED REFERRING PHYSICIAN: Conni Slipper, MD   CARDIOLOGIST: Neoma Laming, MD   CHIEF COMPLAINT: Recurrent syncope.   HISTORY OF PRESENT ILLNESS: The patient is a 81 year old white male with history of recurrent admissions for syncope. His last hospitalization was August 27th where he presented with syncope. At that time he had extensive work-up, including an echocardiogram, MRI of the brain. He has had carotid Dopplers in the past as well as has had EEG which have been negative. His symptoms have been contributed to orthostatic hypotension. The patient has been prescribed compression stockings for his lower extremity. He reports that he wears them most of the time. He has had episodes of this syncope even with wearing these. Here presents today with two episodes of having syncopal episodes where he fell to the floor earlier today. In the Emergency Department when he initially arrived his blood pressure was 95/54, and he was noted to be orthostatic. He was given IV fluid bolus and his blood pressures improved. The patient also complains of some substernal chest pain. He denies any other complaints. No fevers, no chills, no shortness of breath. No abdominal pain. No nausea, vomiting, diarrhea. No urinary frequency, urgency, or hesitancy. He denies any asymmetrical weakness. No headaches.   PAST MEDICAL HISTORY:  1. Recurrent admissions for syncope due to orthostatic hypotension.  2. History of minimal coronary artery disease per cardiac catheterization done last month which showed 60% mid LAD lesion and a distal small vessel lesion at 30%.  3. Hypertension.  4. Diet-controlled diabetes.  5. Obstructive sleep apnea.   ALLERGIES: None.   SOCIAL HISTORY: He smokes about 1 pack of cigarettes per day for 55 years. No alcohol or drug use.   FAMILY HISTORY: Positive for  coronary artery disease. Father died of a heart attack at age 31. Mother died of coronary artery disease in her late 40s.   PAST SURGICAL HISTORY:  1. Recent hernia surgery in February 2013.  2. Left shoulder rotator cuff repair.   CURRENT MEDICATIONS:  1. Acetaminophen/oxycodone 325/5 mg, 1 tab p.o. every 6 hours p.r.n.  2. Plavix 75 p.o. daily.  3. Metoprolol tartrate 25 mg, 1 tab p.o. b.i.d.   NOTE: During his previous hospitalization he was also discharged on simvastatin 40 mg daily, Advair 100/50, 1 puff b.i.d., and  aspirin which apparently he is not taking.    REVIEW OF SYSTEMS: CONSTITUTIONAL: No fevers. Complains of generalized weakness. EYES: No blurred or double vision. No redness. No inflammation. No cataracts. No glaucoma. ENT: No tinnitus. No ear pain. No hearing loss. No seasonal or year-round allergies. No nasal discharge. No postnasal drip. RESPIRATORY: Has intermittent shortness of breath. No coughing. Does have a history of chronic obstructive pulmonary disease. No wheezing. No hemoptysis. CARDIOVASCULAR: Complains of some chest pain in the lower sternal area. No orthopnea. No edema. Has recurrent syncope. Has orthostatic hypotension. GASTROINTESTINAL: No nausea, vomiting, diarrhea. No hematemesis. No medication. No changes in bowel habits. GENITOURINARY: Denies any dysuria, hematuria, renal calculus or frequency. ENDOCRINE: Denies any polyuria, nocturia, or thyroid problems. HEME/LYMPH: Denies anemia, easy bruisability, or bleeding. SKIN: No acne. No rash. No changes in mole, hair or skin. MUSCULOSKELETAL: Denies any pain in the neck, back, or shoulder. NEUROLOGICAL:  No numbness. No cerebrovascular accident. No transient ischemic attack. No seizures. PSYCHIATRIC: No anxiety. No insomnia. No ADD, OCD.  PHYSICAL EXAMINATION:  VITAL SIGNS: Temperature 96.5, pulse 103, respirations 20, blood pressure 95/54.   GENERAL: The patient is an elderly male in no acute distress.   HEENT:  Pupils are equal, round, reactive to light and accommodation. There is no conjunctival pallor. No scleral icterus. Extraocular movements are intact. Head: Atraumatic, normocephalic. Oropharynx and nasopharynx are clear.   NECK: Supple, no JVD. No thyroid enlargement or tenderness.   LUNGS: Clear to auscultation bilaterally without any rales, rhonchi, or wheezing.   ABDOMEN: Soft, nontender, nondistended. Positive bowel sounds x4. No hepatosplenomegaly.   EXTREMITIES: No clubbing, cyanosis, or edema.   SKIN: No rash.   LYMPHATICS: No lymph nodes palpable.   NEUROLOGIC: No focal deficits noted. Cranial nerves II through XII are grossly intact. Sensation is intact.   PSYCHIATRIC: The patient is oriented to time, place, and person x3.   LYMPHATICS: No lymph nodes palpable.   SKIN: No rash or lesions.   LABORATORY, DIAGNOSTIC AND RADIOLOGICAL DATA: CT scan of the head shows no evidence of acute abnormality. Mild age related changes. WBC count was 8.0, hemoglobin 15.8, platelet count was 237. His glucose was 221, BUN 18, creatinine 1.14, sodium 142, potassium 3.1, chloride 108, CO2 26. His LFTs showed a total bilirubin of 1.2, his AST is 11. The rest of the LFTs are negative. Troponin less than 0.02. TSH 0.360. EKG normal sinus rhythm without any ST-T wave changes.   ASSESSMENT AND PLAN: The patient is a 81 year old with history of recurrent admissions for syncope. He had two further episodes with fall. He was orthostatic in the ED and now blood pressure improved.   1. Recurrent syncope due to orthostatic hypotension: The patient is on support hose stockings which he does not use all the time. I recommend he wear those all the time. Also we will try Florinef and repeat his orthostatics in the morning. I will not give him fluids for the time being since he has already received IV fluids in the Emergency Department. I also will check a random cortisol level and make sure he does not have adrenal  insufficiency.  2. Hypokalemia of unclear cause: We will replace his potassium. Repeat a BMP in the morning.  3. Suppressed TSH: We will check a free T4 in the morning.  4. Diabetes, supposedly diet controlled: Blood sugar was 221 here. I will check a hemoglobin A1c, sliding scale insulin. If his hemoglobin A1c is elevated, he will need treatment for his diabetes.  5. Hypertension: In light of recurrent syncope and orthostatic hyppotention ,  I will decrease his metoprolol dose.  5. Elevated bilirubin of unclear etiology: We will repeat LFTs in the morning.  6. Miscellaneous: We will place the patient on Lovenox for deep vein thrombosis prophylaxis.  7. Difficulty with ambulation, recurrent syncope: We will have PT evaluation and case manager evaluation. The patient may need rehab.   TIME SPENT:   35 minutes. ____________________________ Lafonda Mosses Posey Pronto, MD shp:cbb D: 05/24/2012 15:39:02 ET T: 05/24/2012 16:33:47 ET JOB#: [id number]  cc: Melbert Botelho H. Posey Pronto, MD, <Dictator> Alric Seton MD ELECTRONICALLY SIGNED 05/24/2012 20:38

## 2015-01-06 NOTE — Consult Note (Signed)
PATIENT NAME:  [name] [name] MR#:  [id number] DATE OF BIRTH:  Feb 16, 1934  DATE OF CONSULTATION:  04/29/2012  REFERRING PHYSICIAN:   CONSULTING PHYSICIAN:  Cletis Athens, MD  HISTORY OF PRESENT ILLNESS: [name] [name] was admitted into the hospital with chest pain, in the lower part of the chest, without any radiation to the neck, shoulder, or arm. The patient is known to have dyslipidemia, hypertension, and chronic obstructive pulmonary disease. He was also found to be ataxic and has trouble walking. He smokes occasionally, does not drink. He has had diabetes for the last two years. He has a touch of asthma and chronic obstructive pulmonary disease secondary to heavy smoking in the past. There is no previous history of heart attack. There is no history of pneumonia. The patient moved from [place] recently.   REVIEW OF SYSTEMS: There is no history of fever, chills, eye problems, or epistaxis. Complained of dyspnea on exertion. There is no orthopnea or nocturnal dyspnea. He is known to have chronic obstructive pulmonary disease. There is no history of nausea, vomiting, black stool, melena, or ulcer. There is no dysuria, hematuria, polyuria, or polydipsia. No history of stroke. No gout. There is no history of anxiety with depression.   PAST MEDICAL HISTORY:  1. Hypertension. 2. Hyperlipidemia. 3. Chronic obstructive pulmonary disease. 4. Diabetes.   ACCESSORY CLINICAL DATA: Chloride 108 and glucose 107. Anion gap 5. Triglyceride 231. Troponin 0.5 and 0.5. CPK 29. Hemoglobin 16.1. Hematocrit 47.0   Urinalysis is unremarkable.  Chest x-ray revealed cardiomegaly. Atelectasis may be present at the bases, or fibrosis.  PHYSICAL EXAMINATION:   GENERAL: The patient is a well-nourished male, alert and cooperative.   HEENT: Head normocephalic. Pupils reactive. Sclerae anicteric. Tongue is moist, papillated. The patient has a small nodule on the left ear. Also has a history of cancer surgery of the left  helix. He was told to see a dermatologist when he goes home from the hospital.   NECK: Supple. There is no bruit in the neck.   CARDIOVASCULAR: Apical impulse is not palpable. Both first and second heart sounds are normal. No murmur is audible.   CHEST: Decreased breath sounds without any rales or rhonchi.   ABDOMEN: Soft and nontender without any hepatosplenomegaly. There is no pedal edema, no calf tenderness. The patient has an ataxic gait and subtle weakness in the left lower leg. Upper extremity does not show any weakness.   IMPRESSION AND RECOMMENDATIONS:  1. Atypical chest pain, rule out myocardial infarction. Cycling of the cardiac enzymes shows positive troponin, but not conclusive. We will get a Myoview Lexiscan stress test and continue the beta blocker and nitroglycerin. The patient has quit smoking. 2. Hypertension, this is under control. We will also check his lipids to evaluate the lipid status.  3. Diabetes seems to be under control too.  4. We will look at the Myoview stress test and then plan for further work-up.  ____________________________ Cletis Athens, MD jm:slb D: 04/29/2012 12:09:18 ET T: 04/30/2012 09:49:24 ET JOB#: [id number]  cc: Cletis Athens, MD, <Dictator> Cletis Athens MD ELECTRONICALLY SIGNED 05/10/2012 19:22

## 2015-01-06 NOTE — Discharge Summary (Signed)
PATIENT NAME:  [name] [name], [name] [name] MR#:  [id number] DATE OF BIRTH:  1934/04/09  DATE OF ADMISSION:  04/30/2012 DATE OF DISCHARGE:  05/02/2012  DIAGNOSES:  1. Chest pain. 2. Coronary artery disease with left anterior descending lesion, 60%. 3. Unsteady gait. 4. Hypertension. 5. Hyperlipidemia. 6. Diabetes, diet controlled. 7. Smoking. 8. Chronic obstructive pulmonary disease.   CONSULTATION: Cardiology consultation with Dr. Lavera Guise, Dr. Humphrey Rolls.   DISPOSITION: The patient is being discharged home with home health.   DIET: Low sodium, 1800 calorie ADA diet.   ACTIVITY: As tolerated.   FOLLOW UP: Follow-up with primary care physician, Dr. Clayborn Bigness and Dr. Neoma Laming in 1 to 2 weeks after discharge.   DISCHARGE MEDICATIONS:  1. Aspirin 81 mg daily.  2. Percocet 5/325 one tablet q.6 hours p.r.n.  3. Nitroglycerin p.r.n.  4. Lopressor 25 mg b.i.d.  5. Plavix 75 mg daily. 6. Advair 100/50 one puff b.i.d.  7. Simvastatin 40 mg daily.   RESULTS: Catheterization showed 60% LAD lesion. Medical management was advised. Myoview positive for ischemia. Chest x-ray: Borderline cardiomegaly with chronic obstructive pulmonary disease, patchy atelectasis, fibrosis. Microbiology: Urine culture 2000 gram-positive cocci. CBC normal. VLDL elevated at 46, LDL 90, cholesterol 176, triglycerides 231, HDL 40. Cardiac enzymes negative. Rest of CMP was normal.   HOSPITAL COURSE: The patient is a 81 year old male with a history of chronic obstructive pulmonary disease, diabetes diet controlled, hypertension, ongoing smoking who presented with chest pain. His serial cardiac enzymes are negative; however, his Myoview was positive for ischemia. Therefore, he underwent a catheterization which showed an LAD lesion, 60%. Medical management was advised with aspirin, Plavix, beta blocker. Patient also had unsteady gait and was evaluated by PT who recommended home health. He had no neurological deficit. His hypertension was well  controlled. His LDL is at goal; however, his cholesterol and VLDL are elevated. He is being discharged home on a statin therapy. He has diet-controlled diabetes. His hemoglobin A1c is 5.6. He has been extensively counseled about smoking cessation. He is being discharged home with home health and physical therapy. He has a followup appointment on this coming Tuesday with Dr. Neoma Laming.   TIME SPENT: 45 minutes.    ____________________________ Cherre Huger, MD sp:vtd D: 05/02/2012 15:03:58 ET T: 05/03/2012 13:23:25 ET JOB#: [id number]  cc: Cherre Huger, MD, <Dictator> Cherre Huger MD ELECTRONICALLY SIGNED 05/03/2012 15:11

## 2015-01-06 NOTE — Consult Note (Signed)
PATIENT NAME:  [name] [name], [name] [name] MR#:  [id number] DATE OF BIRTH:  1934/01/09  DATE OF CONSULTATION:  05/16/2012  REFERRING PHYSICIAN:  Dr. Manuella Ghazi  CONSULTING PHYSICIAN:  Audree Camel. Charise Carwin, MD  REASON FOR CONSULTATION: Syncope.   HISTORY OF PRESENT ILLNESS: [name] [name] is a 81 year old white male with a known prior history of coronary artery disease, hypertension, and diabetes. He is undergoing evaluation for syncope. History was according to the patient who is a fairly good historian and review of the hospital chart.   [name] [name] indicates that he was in his usual state of health until yesterday. He had apparently just gotten out of the bathroom and went to the bedroom when he suddenly felt dizzy followed by loss of consciousness. This was an unwitnessed event as he was by himself and he thinks he may have been out for a few minutes. When he woke up he was on the floor and was alert enough to be able to call 9-1-1. He was then taken to [facility] where he was evaluated further with a head CT that was noted to be unremarkable. EKG showed a first degree AV block. He was admitted for further evaluation with head MRI, orthostatics, as well as Cardiology consult to evaluate some previous difficulties with chest tightness. Head MRI revealed simply some white matter changes, otherwise was unremarkable. Neurology is consulted at this time for further evaluation.   Pertinent additional note is that [name] [name] denies any prior similar episodes. There was no heart racing with the episode, dizziness, etc. He states when he awoke he was simply "weak all over". There was no dysphasia or dysarthria but he thinks he may have been a bit vertiginous yesterday. Also, no bowel or bladder incontinence and no tongue biting.   PAST MEDICAL HISTORY: 1. Hypertension. 2. Coronary artery disease. 3. Diet controlled diabetes. 4. COPD.   There is no noted history of prior stroke, seizure, or kidney problems.    PAST  SURGICAL HISTORY:  1. Hernia surgery. 2. February 2013 left shoulder rotator cuff repair.   MEDICATIONS AT THIS TIME: 1. Nitroglycerin tablets. 2. Simvastatin. 3. Plavix. 4. Metoprolol. 5. Fluticasone/salmeterol inhaler.  6. Oxycodone p.r.n. 7. Aspirin. 8. Colace. 9. Ambien. 10. Benadryl. 11. Pantoprazole.  12. Zofran injection. 13. Senokot p.r.n.  ALLERGIES: No known drug allergies.  SOCIAL HISTORY: He resides locally. He does smoke about a pack of cigarettes a day for the last 55 years. Does not use alcohol.   FAMILY HISTORY: Reviewed and noncontributory.   REVIEW OF SYSTEMS: As noted above.   PHYSICAL EXAMINATION: Pleasant, cooperative elderly white male who is in no acute distress. He is alert and oriented x3 with fluent speech. He is able to repeat and follow commands and other areas of higher intellectual functioning appear to be intact. He is currently afebrile. Vital signs are stable. Last blood pressure is noted to be 144/71.  Extraocular movements were intact. Pupils equal, round, and reactive to light. Face is symmetric. Tongue protrudes midline. Otherwise, cranial nerves II through XII appear to be intact. On motor examination he has normal bulk and tone with 5 out of 5 strength throughout. Sensory exam reveals symmetric vibration. On cerebellar exam finger-to-nose there is no evidence of pronator drift. Deep tendon reflexes are 1+ with toes downgoing.  CARDIOVASCULAR: Regular rate and rhythm without murmur, rub, or gallop. No evidence of carotid bruits. No evidence of cyanosis, clubbing, or edema.   ABDOMEN: Soft, nontender with normoactive bowel sounds.  LUNGS: Clear to  auscultation.  SKIN: No obvious cuts, abrasions, bruises, or rashes.   DIAGNOSTIC STUDIES: Head CT and MRI as noted above. He has also had lab work performed that included normal troponins. Serum glucose elevated at 126, creatinine 1.24, calcium slightly low at 8.3. CBC is unremarkable. LFTs are also  noted to be normal.   ASSESSMENT: Syncope of unclear etiology.   DISCUSSION: At this time [name] [name] presents with a few vascular risk factors as outlined above. He had the sudden onset of syncopal episode yesterday preceded by some dizziness but no definitive seizure activity, although this was an unwitnessed event. There was also additionally no focal post-syncopal findings. At this point his neurologic examination is nonfocal. Head MRI as well as head CT have been unremarkable. Etiology of this event is unclear. Given the preceding dizziness as well as normal head CT along with some questionable vertiginous component, the possibility of this episode due to vestibular disease, orthostasis, or vertebral vascular insufficiency should all be considered. Certainly if this were due to vertebral vascular insufficiency, I would not expect him to have a full syncopal event.   PLAN: I would like to add head MRA in addition to his MRI that's already been performed to further evaluate the basilar artery and exclude vertebrobasilar disease. EEG will be ordered. I agree with antiplatelet therapy, however, there is no need for both aspirin and Plavix from a neurological standpoint. I would suggest continuing to watch him off his seizure medications at this time. Finally, I do agree with Cardiology evaluation.   Thank you very much for allowing me to participate in the care of this very interesting and pleasant patient.  ____________________________ Audree Camel. Charise Carwin, MD ray:drc D: 05/16/2012 15:29:20 ET T: 05/16/2012 16:27:30 ET JOB#: [id number] cc: Herbie [place] A. Charise Carwin, MD, <Dictator> Cherre Robins MD ELECTRONICALLY SIGNED 06/17/2012 18:55

## 2015-01-06 NOTE — Consult Note (Signed)
Brief Consult Note: Diagnosis: Syncope.   Consult note dictated.   Comments: Patient denies CP, chest pressure at this time and no acute intracranial abnormality on MRI. Will need to r/o carotid artery stensos(doppler ordered), cardiac arrhythmia. No evidence of ACS as TNI neg x 3 and EKG with no acute changes. Cardiac cath recently completed 05/01/12 with moderate LAD disease at site of bifurcation with large D1 and normal LVEF. Echo has already been ordered and will get results of it and carotid doppler. If both are normal, consider Holter monitor which can be done as outpatient.  Electronic Signatures: Angelica Ran (MD)   (Signed [phone] 08:38)  Co-Signer: Brief Consult Note Merla Riches (PA-C)   (Signed 28-Aug-13 16:47)  Authored: Brief Consult Note  Last Updated: 29-Aug-13 08:38 by Angelica Ran (MD)

## 2015-01-06 NOTE — H&P (Signed)
PATIENT NAME:  [name] [name], [name] [name] MR#:  [id number] DATE OF BIRTH:  [birth year]  DATE OF ADMISSION:  05/15/2012  PRIMARY CARE PHYSICIAN: None. CARDIOLOGIST: Dr. Neoma Laming  CHIEF COMPLAINT: Syncope.   HISTORY OF PRESENT ILLNESS: Patient is a 81 year old male with a known history of hypertension, coronary artery disease is being admitted for syncope. Patient was recently in the hospital in mid August when he was diagnosed with coronary disease, status post cardiac catheterization and medical management was recommended. Patient was doing well until this afternoon around 2:00 when he was walking from his bathroom to bedroom going to kitchen and subsequently living room where walking on the carpet he passed out. He does not remember any symptoms before passing out. He was flat out on the carpet. Woke up within five minutes. He had his cell phone in his pocket and called 911 who brought him to the Emergency Department. He denies any injury as he is not hurting anywhere. He has been feeling very staggering gait since then and feels very unsteady. He does report having some chest tightness off-and-on for last several weeks.   PAST MEDICAL HISTORY:  1. Coronary artery disease. 2. Diabetes, diet-controlled.  3. Hypertension.  4. Chronic obstructive pulmonary disease.  ALLERGIES: No known drug allergies.   SOCIAL HISTORY: Smokes about 1 pack of cigarettes daily for last 55 years. No alcohol or drug use.   FAMILY HISTORY: Positive for coronary artery disease. Father died of heart attack at the age of 39. Mother died of coronary artery disease in her late 43s.   PAST SURGICAL HISTORY:  1. Recent hernia surgery in February 2013. 2. Left shoulder rotator cuff repair.   REVIEW OF SYSTEMS: CONSTITUTIONAL: No fever, fatigue, weakness. EYES: No blurred or double vision. ENT: No tinnitus, ear pain. RESPIRATORY: No cough, wheezing, hemoptysis. CARDIOVASCULAR: Positive for chest tightness. No palpitations.  GASTROINTESTINAL: No nausea, vomiting, diarrhea. GENITOURINARY: No dysuria, hematuria. ENDOCRINE: No polyuria or nocturia. HEMATOLOGY: No anemia, easy bruising. SKIN: No rash or lesion. MUSCULOSKELETAL: No arthritis or muscle cramp. NEUROLOGICAL: Syncope and difficulty in ambulation. PSYCHIATRY: No history of anxiety or depression.   MEDICATIONS AT HOME:  1. Acetaminophen/oxycodone 325/5, 1 tablet p.o. every six hours as needed. 2. Advair 100/50, 1 puff b.i.d.  3. Aspirin 81 mg p.o. daily.  4. Plavix 75 mg p.o. daily.  5. Metoprolol 25 mg p.o. b.i.d.   6. Nitroglycerin 0.4 mg sublingual as needed.  7. Zocor 40 mg p.o. at bedtime.   PHYSICAL EXAMINATION:  VITAL SIGNS: Temperature 96.9, heart rate 79 per minute, respirations 18 per minute, blood pressure 113/57 mmHg. He is saturating 100% on room air.  GENERAL: Patient is a 81 year old male lying in the bed comfortably without any acute distress.   EYES: Pupils equal, round, reactive to light, accommodation. No scleral icterus. Extraocular muscles intact.   HENT: Head atraumatic, normocephalic. Oropharynx and nasopharynx clear.   NECK: Supple. No jugular venous distention. No thyroid enlargement or tenderness.   LUNGS: Clear to auscultation bilaterally. No wheezing, rales, rhonchi, crepitation.   ABDOMEN: Soft, nontender, nondistended. Bowel sounds present. No organomegaly or masses.   EXTREMITIES: No pedal edema, cyanosis, clubbing.   NEUROLOGIC: Nonfocal examination. Cranial nerves III through XII intact. Muscle strength 5/5 in all extremities. Sensation intact.   PSYCH: Patient is oriented to time, place, and person x3.   SKIN: No obvious rash, lesion, ulcer.  LABORATORY, DIAGNOSTIC AND RADIOLOGICAL DATA: normal BMP. Normal liver function tests. Negative troponin. Normal TSH. Normal CBC. Negative urinalysis.  CT scan of the head without contrast showed no acute intracranial abnormality.   EKG shows normal sinus rhythm with  first degree AV block, no major ST-T changes.   IMPRESSION AND PLAN:  1. Syncope. Will get 2-D echocardiogram. Cardiology and neurology consultation will be obtained. Physical therapy consultation was sought. Will continue aspirin and Plavix. Obtain MRI of the brain and carotid Doppler's. Check orthostatics. TSH is within normal limits. Will repeat EKG and chest x-ray in the morning. Will get serial troponins.  2. Chest tightness. Will consult cardiology. He does have known coronary disease. Will continue aspirin, Plavix, nitroglycerin and statin along with beta blocker. Monitor him on telemetry. 3. Hypertension. Will continue blood pressure medication. It is stable.  4. Coronary disease. Medical management per cardiology. Will consult cardiology.  5. CODE STATUS: FULL CODE.   TIME SPENT: Total time taking care of this patient including coordination of care, discussion with ED physician and review of the record took 55 minutes.  ____________________________ Lucina Mellow. Manuella Ghazi, MD vss:cms D: 05/15/2012 19:06:33 ET T: 05/16/2012 05:53:14 ET JOB#: [id number]  cc: Artelia Game S. Manuella Ghazi, MD, <Dictator> Dionisio David, MD [place] MD ELECTRONICALLY SIGNED 05/17/2012 16:33

## 2015-01-06 NOTE — H&P (Signed)
PATIENT NAME:  [name] [name], CAPOTE MR#:  [id number] DATE OF BIRTH:  1933/12/12  DATE OF ADMISSION:  04/28/2012  PRIMARY CARE PHYSICIAN: None.   CHIEF COMPLAINT: Chest pain and staggering gait.   HISTORY OF PRESENT ILLNESS: This 81 year old male with a history of diabetes, chronic obstructive pulmonary disease, hypertension, hyperlipidemia, not taking any medications except for Zocor who presents with the above complaint. The patient says at about 4:00 this afternoon he was sitting watching TV when he developed sudden onset of chest pressure along with some abdominal pain and felt nauseous. He wanted to vomit about 6:00, but was unable to, felt very weak and felt like he had a staggering gait. He drove himself here. In triage, the nurse told him that he looked like he was drunk. The patient's chest pressure is currently relieved. There are no relieving or aggravating factors. No radiation. No shortness of breath associated with this. He has not had this chest pressure recently. He says he had very similar symptoms in 1998 which he was in the hospital for three days. He does not know exactly what they did but since that he has not had any kind of symptoms. He is a diabetic, was supposed to be on metformin but not taking it as his wife had passed away from a diabetic coma on metformin.   REVIEW OF SYSTEMS: CONSTITUTIONAL: No fever or chills. Positive generalized weakness. EYES: No blurred or double vision, glaucoma or cataracts. ENT: No nasal discharge or epistaxis. CARDIOVASCULAR: Chest pressure as mentioned above. No shortness of breath, dyspnea on exertion, paroxysmal nocturnal dyspnea or orthopnea. RESPIRATORY: No shortness of breath. Positive history of chronic obstructive pulmonary disease. No wheezing. GASTROINTESTINAL: No nausea, vomiting, diarrhea, abdominal pain, melena, or ulcers. GU: No dysuria or hematuria. ENDOCRINE: No polyuria or polydipsia. HEME/LYMPH: No anemia, bruising or lymphadenopathy. SKIN:  Without rash or lesions. MUSCULOSKELETAL: The patient has generalized weakness. No gout. NEUROLOGIC: No history of cerebrovascular accident, transient ischemic attack, or seizures. PSYCH: No anxiety or depression.   PAST MEDICAL HISTORY:  1. Diabetes.  2. Chronic obstructive pulmonary disease.  3. Hypertension.  4. Hyperlipidemia.   MEDICATION: Simvastatin, unknown dose.   SOCIAL HISTORY: The patient smokes a pack a week. No alcohol or IV drug use.   FAMILY HISTORY: Positive for coronary artery disease. Father died of heart attack at age 76. Mother died of coronary artery disease in her late 58s.   PAST SURGICAL HISTORY:  1. Recent hernia surgery 10/2011.  2. Left shoulder rotator cuff.   ALLERGIES: No known drug allergies.   PHYSICAL EXAMINATION:  VITAL SIGNS: Temperature 98.1, pulse 80, respirations 18, blood pressure 150/81, 95% on room air.   GENERAL: The patient is alert, oriented, not in distress.   HEENT: Head is atraumatic. Pupils are round and reactive to light. Sclerae are anicteric. Mucous membranes are moist. Oropharynx is clear.   NECK: Supple without jugular venous distention, carotid bruit, or enlarged thyroid.   CARDIOVASCULAR: Regular rate and rhythm. No murmurs, gallops, or rubs. PMI is not displaced.   LUNGS: Clear to auscultation without rales, rhonchi, or wheezing. Normal to percussion.   ABDOMEN: Bowel sounds are present. Nontender, nondistended. No hepatosplenomegaly.   EXTREMITIES: No clubbing, cyanosis or edema.   NEUROLOGIC: Cranial nerves II through XII are grossly intact. There are no focal deficits.   SKIN: Without rash or lesions.   MUSCULOSKELETAL: Five out of five strength in all extremities.   BACK: No CVA or vertebral tenderness.   LABORATORY, RADIOLOGICAL AND  DIAGNOSTIC DATA: Sodium 143, potassium 3.5, chloride 108, bicarbonate 25, BUN 14, creatinine 0.88, glucose 107, bilirubin 0.5, alkaline phosphatase 92, ALT 13, AST 11, total protein  7.1, albumin 3.4. Calcium is 8.6. White blood cells 8.5, hemoglobin 16, hematocrit 47, platelets 251. Troponin less than 0.02. CK 29. CPK-MB less than 0.05. Urinalysis shows trace leukocyte esterase with 7 white blood cells and no bacteria. EKG shows normal sinus rhythm. No ST elevations or depressions. Chest x-ray shows no acute cardiopulmonary disease.   ASSESSMENT AND PLAN: The patient is a 81 year old male who presents with chest pain and staggering gait.  1. Chest pain. The patient is a diabetic, not compliant. Will go ahead and admit to the hospital under observation on the telemetry unit. Continue to cycle cardiac enzymes. Will order a cardiology consult as we cannot order a Myoview for the a.m. due to it being a Sunday. Will start aspirin, beta blocker and nitroglycerin p.r.n. and continue statin. Further management per cardiology.  2. Staggering gait. I did assess the patient's gait while in the ER. He does not seem to be having a staggering gait at this time. We will go ahead and consult physical therapy. No other neurological deficits are seen.  3. Urinary tract infection. Will start Rocephin and order urine cultures.  4. Hypertension. I have ordered metoprolol and will need to follow.  5. Hyperlipidemia. Will continue on statin therapy. Check fasting lipids.  6. History of diabetes, not on any medications. The patient will likely need medications at discharge. He does not want to take metformin due to his wife passing away from a diabetic coma while on metformin. We will hemoglobin A1c. Sliding scale insulin. ADA diet.  7. Code status: The patient is a FULL CODE status. The patient will need a primary care physician prior to discharge.  8. Smoking dependence. The patient was counseled for three minutes regarding trying to quit smoking. He is not interested at this time and does not want a nicotine patch.   TIME SPENT: Approximately 55 minutes.  ____________________________ Donell Beers. Benjie Karvonen,  MD spm:ap D: 04/28/2012 21:31:53 ET                T: 04/29/2012 10:01:06 ET                JOB#: [id number] cc: Gradyn Shein P. Benjie Karvonen, MD, <Dictator> Dmarcus Decicco P Jayna Mulnix MD ELECTRONICALLY SIGNED 04/29/2012 13:10

## 2015-01-06 NOTE — Discharge Summary (Signed)
PATIENT NAME:  [name] [name], [name] [name] MR#:  [id number] DATE OF BIRTH:  27-Mar-1934  DATE OF ADMISSION:  05/15/2012 DATE OF DISCHARGE:  05/17/2012  DIAGNOSES:  1. Syncope likely due to orthostasis. 2. Coronary artery disease. 3. Hypertension. 4. Diet-controlled diabetes. 5. Chronic obstructive pulmonary disease.   DISPOSITION: Patient is being discharged home with home health and physical therapy.   DIET: Low sodium, 1800 calorie ADA diet.   ACTIVITY: As tolerated.   FOLLOW UP: Follow up with Dr. Neoma Laming in one to two weeks after discharge.   DISCHARGE MEDICATIONS:  1. Simvastatin 40 mg daily.  2. Advair 100/50 mcg 1 puff b.i.d.  3. Aspirin 81 mg daily.  4. Nitroglycerin p.r.n.  5. Lopressor 25 mg b.i.d.  6. Tylenol/oxycodone 5/325 mg 1 tablet q.6 hours p.r.n.  7. Plavix 75 mg daily.   CONSULTATIONS:  1. Cardiology consultation with Dr. Charise Carwin. 2. Cardiology consultation with Dr. Neoma Laming.  LABORATORY, DIAGNOSTIC, AND RADIOLOGICAL DATA: CT of the head showed no acute abnormality. Chest x-ray showed no acute abnormality with emphysema. MRI of the brain: Chronic white matter ischemic changes. No acute changes. 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 Surveillance evaluation in 6 to 8 months is recommended. Carotid Doppler showed no hemodynamically significant stenosis. Echo showed normal size and function. Moderate MR, trace TR, ejection fraction 50% to 55%. CBC normal. Glucose 115. Electrolytes normal. BUN and creatinine normal. Cardiac enzymes negative. TSH normal. EEG showed no evidence of any seizure disorder and was within normal limits.   HOSPITAL COURSE: Patient is a 81 year old male with history of coronary artery disease on medical management, diabetes, hypertension, chronic obstructive pulmonary disease  who presented with one syncopal episode. Patient had extensive work-up including CT of the head, MRI of the brain, carotid ultrasound which were all negative. Patient was found to be orthostatic on standing. His cardiac enzymes were negative. TSH was normal. He was evaluated by physical therapy who recommended short-term rehabilitation but patient was insistent on going home therefore home health and physical therapy were arranged for the patient. 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 Follow-up MRA is recommended in 6 to 8 months to ensure stability. Patient was evaluated by cardiologist and neurologist during the hospitalization. Cardiologist will plan for outpatient Holter monitor if patient continues to have symptoms. The rest of his medical problems remained stable. He is being discharged home in a stable condition.   TIME SPENT: 45 minutes.   ____________________________ Cherre Huger, MD sp:cms D: 05/17/2012 16:55:57 ET T: 05/18/2012 11:50:05 ET JOB#: [id number]  cc: Cherre Huger, MD, <Dictator>  Cherre Huger MD ELECTRONICALLY SIGNED 05/18/2012 13:41

## 2015-01-06 NOTE — Consult Note (Signed)
PATIENT NAME:  [name] [name], [name] [name] MR#:  [id number] DATE OF BIRTH:  [birth year]  DATE OF CONSULTATION:  05/16/2012  REFERRING PHYSICIAN: Dr. Manuella Ghazi  CONSULTING PHYSICIAN:  Neoma Laming, MD/Taino Maertens Elease Etienne, PA-C  PRIMARY CARE PHYSICIAN: Dr. Lavera Guise   REASON FOR CONSULTATION: Syncope.   HISTORY OF PRESENT ILLNESS: [name] [name] is a 81 year old white male who is known to [place]. He has a history of hypertension, coronary artery disease, diabetes mellitus, diet controlled, and tobacco abuse. The patient notes that yesterday he was walking and had a syncopal episode. He believes he was "out" for about 7 to 8 minutes and did not have any incontinence of bladder or stool. The patient states that this has never happened before and he does not believe he was dehydrated. He denies any chest pain or chest pressure but does have some chronic shortness of breath that is worse with strenuous exertion. He has not noticed any dizziness, edema, orthopnea, or PND at home.   PAST MEDICAL HISTORY:  1. Coronary artery disease.  2. Diabetes mellitus, diet controlled.  3. Hypertension.  4. Chronic obstructive pulmonary disease.   PAST SURGICAL HISTORY:  1. Bilateral inguinal hernia repair in February 2013.  2. Left rotator cuff repair August 2013.  3. Repair of Morton's neuroma of the foot April of 1984.   ALLERGIES: No known drug allergies.   HOME MEDICATIONS:  1. Acetaminophen/oxycodone 325/5 one tablet every six hours as needed.  2. Advair 100/50 one puff b.i.d.  3. Aspirin 81 mg daily.  4. Plavix 75 mg p.o. daily.  5. Metoprolol tartrate 25 mg p.o. b.i.d.  6. Nitroglycerin 0.4 mg sublingually as needed for chest pain.  7. Zocor 40 mg p.o. at bedtime.   SOCIAL HISTORY: The patient smokes at least 1 pack of cigarettes per day for the last 55 years. He denies any alcohol or illicit drug use. He rarely uses any caffeine.   FAMILY HISTORY: Father died of heart attack at age 45, mom with coronary artery disease in her  late 57's.   REVIEW OF SYSTEMS: CONSTITUTIONAL: No fever, fatigue, weakness. EYES: No blurred or double vision. RESPIRATORY: Shortness of breath on exertion. No coughing or wheezing. CARDIOVASCULAR: No chest pain or palpitations. GI: No nausea, vomiting, or abdominal pain. EXTREMITIES: No edema.   PHYSICAL EXAMINATION:   GENERAL: This is a pleasant male not in any acute distress. He is alert and oriented watching TV in bed.   VITAL SIGNS: Temperature 98.1 degrees Fahrenheit, heart rate 70, respiratory rate 20, blood pressure 144/71, oxygen saturation 94% on room air.   HEENT: Head atraumatic, normocephalic. The patient wears glasses. Pupils are round and equal. There is no scleral icterus. Conjunctivae are pale, pink. Ears and nose normal to external inspection. Mouth poor dentition with multiple missing teeth. Moist mucous membranes.   NECK: Supple. Trachea is midline. No carotid bruits appreciated.   LUNGS: Clear to auscultation bilaterally with no adventitious breath sounds or accessory muscle use.   CARDIOVASCULAR: Regular rate and rhythm. No murmurs, rubs, or gallops can be appreciated.   ABDOMEN: Soft and nondistended. Bowel sounds present in all four quadrants. It is soft with no hepatosplenomegaly noted.   EXTREMITIES: No cyanosis, clubbing, or edema.   ANCILLARY DATA: Glucose 126, BUN 20, creatinine 1.24, sodium 141, potassium 3.9, chloride 107, estimated GFR 56, total protein 6.2, albumin 3.0, total bilirubin 0.6, alkaline phosphatase 86, AST 19, ALT 15. Troponin-I is less than 0.02 x3. TSH 0.917. White blood cell count 8.1, hemoglobin 14.6,  hematocrit 43.9, platelet count 232,000.   MRI of the brain from 05/16/2012 white matter changes noted consistent with chronic ischemia.  Diagnostic orbital no foreign body seen.   Chest x-ray August 28th COPD with cardiomegaly and significant bullous emphysematous changes in the left upper lung. Superimposed fibrosis is not  excluded.  Carotid Doppler from 05/15/2012 no evidence of hemodynamically significant stenosis within the right or left carotid systems.   Head CT 05/15/2012 no evidence of acute intracranial abnormalities.  EKG on admission with normal sinus rhythm with first degree AV block, nonspecific ST changes.   Cardiac catheterization 05/01/2012 done by Dr. Neoma Laming with 60% stenosis at the mid LAD at the bifurcation of the diagonal branch, distal LAD 30% stenosis, mid circumflex 30% stenosis, normal left ventricular ejection fraction of 55%.   ASSESSMENT/PLAN:  1. Syncope. The patient does not appear to have acute coronary syndrome as his EKG is with no acute changes and troponin has been negative x3. Echocardiogram has been ordered to rule out any acute wall motion abnormality or thrombus. Carotid Doppler does not show any significant carotid artery disease. His symptoms may be from underlying cardiac arrhythmia and can consider Holter monitoring. There are no acute intracranial changes on CT scan or MRI, however, chronic ischemic changes were noted.  2. Coronary artery disease. The patient had recent admission in August for chest pain and had catheterization with moderate LAD disease at the site of the bifurcation but had a large first diagonal. It was recommended that he be treated medically. Continue current therapy with beta-blockers, Plavix, aspirin, and statin.     We will continue to follow this patient with you. Thank you very much for this consultation and allowing [place] to participate in this patient's care.   ____________________________ Merla Riches, PA-C mam:drc D: 05/16/2012 16:56:41 ET T: 05/16/2012 17:38:12 ET JOB#: [id number]  cc: Merla Riches, PA-C, <Dictator> Cletis Athens, MD Maisha Bogen A [facility] PA ELECTRONICALLY SIGNED 05/18/2012 16:10

## 2016-11-01 ENCOUNTER — Observation Stay
Admission: EM | Admit: 2016-11-01 | Discharge: 2016-11-02 | Disposition: A | Discharge status: Home / Self Care | Payer: Medicare (Managed Care) | Attending: Specialist | Admitting: Specialist

## 2016-11-01 ENCOUNTER — Encounter: Payer: Self-pay | Admitting: Emergency Medicine

## 2016-11-01 ENCOUNTER — Emergency Department: Payer: Medicare (Managed Care)

## 2016-11-01 DIAGNOSIS — Z79899 Other long term (current) drug therapy: Secondary | ICD-10-CM | POA: Insufficient documentation

## 2016-11-01 DIAGNOSIS — E119 Type 2 diabetes mellitus without complications: Secondary | ICD-10-CM | POA: Insufficient documentation

## 2016-11-01 DIAGNOSIS — Z7951 Long term (current) use of inhaled steroids: Secondary | ICD-10-CM | POA: Diagnosis not present

## 2016-11-01 DIAGNOSIS — Z8711 Personal history of peptic ulcer disease: Secondary | ICD-10-CM | POA: Diagnosis not present

## 2016-11-01 DIAGNOSIS — F329 Major depressive disorder, single episode, unspecified: Secondary | ICD-10-CM | POA: Diagnosis not present

## 2016-11-01 DIAGNOSIS — M48061 Spinal stenosis, lumbar region without neurogenic claudication: Secondary | ICD-10-CM | POA: Diagnosis not present

## 2016-11-01 DIAGNOSIS — Z87442 Personal history of urinary calculi: Secondary | ICD-10-CM | POA: Diagnosis not present

## 2016-11-01 DIAGNOSIS — Z8249 Family history of ischemic heart disease and other diseases of the circulatory system: Secondary | ICD-10-CM | POA: Diagnosis not present

## 2016-11-01 DIAGNOSIS — I10 Essential (primary) hypertension: Secondary | ICD-10-CM | POA: Diagnosis not present

## 2016-11-01 DIAGNOSIS — E785 Hyperlipidemia, unspecified: Secondary | ICD-10-CM | POA: Insufficient documentation

## 2016-11-01 DIAGNOSIS — J449 Chronic obstructive pulmonary disease, unspecified: Secondary | ICD-10-CM | POA: Diagnosis not present

## 2016-11-01 DIAGNOSIS — Z87891 Personal history of nicotine dependence: Secondary | ICD-10-CM | POA: Diagnosis not present

## 2016-11-01 DIAGNOSIS — Z66 Do not resuscitate: Secondary | ICD-10-CM | POA: Diagnosis not present

## 2016-11-01 DIAGNOSIS — Z801 Family history of malignant neoplasm of trachea, bronchus and lung: Secondary | ICD-10-CM | POA: Insufficient documentation

## 2016-11-01 DIAGNOSIS — K219 Gastro-esophageal reflux disease without esophagitis: Secondary | ICD-10-CM | POA: Diagnosis not present

## 2016-11-01 DIAGNOSIS — F419 Anxiety disorder, unspecified: Secondary | ICD-10-CM | POA: Insufficient documentation

## 2016-11-01 DIAGNOSIS — Z791 Long term (current) use of non-steroidal anti-inflammatories (NSAID): Secondary | ICD-10-CM | POA: Insufficient documentation

## 2016-11-01 DIAGNOSIS — R079 Chest pain, unspecified: Principal | ICD-10-CM | POA: Insufficient documentation

## 2016-11-01 DIAGNOSIS — Z9889 Other specified postprocedural states: Secondary | ICD-10-CM | POA: Insufficient documentation

## 2016-11-01 LAB — BASIC METABOLIC PANEL
Anion gap: 8 (ref 5–15)
BUN: 20 mg/dL (ref 6–20)
CO2: 27 mmol/L (ref 22–32)
Calcium: 9 mg/dL (ref 8.9–10.3)
Chloride: 104 mmol/L (ref 101–111)
Creatinine, Ser: 1.23 mg/dL (ref 0.61–1.24)
GFR calc Af Amer: >60 mL/min (ref >60)
GFR calc non Af Amer: 54 mL/min — ABNORMAL LOW (ref >60)
Glucose, Bld: 119 mg/dL — ABNORMAL HIGH (ref 65–99)
Potassium: 3.9 mmol/L (ref 3.5–5.1)
Sodium: 139 mmol/L (ref 135–145)

## 2016-11-01 LAB — CBC
HEMATOCRIT: 43.4 % (ref 40.0–52.0)
Hemoglobin: 14.4 g/dL (ref 13.0–18.0)
MCH: 29.9 pg (ref 26.0–34.0)
MCHC: 33.3 g/dL (ref 32.0–36.0)
MCV: 89.7 fL (ref 80.0–100.0)
Platelets: 277 10*3/uL (ref 150–440)
RBC: 4.83 MIL/uL (ref 4.40–5.90)
RDW: 14.1 % (ref 11.5–14.5)
WBC: 9 10*3/uL (ref 3.8–10.6)

## 2016-11-01 LAB — TROPONIN I: Troponin I: <0.03 ng/mL (ref <0.03)

## 2016-11-01 MED ORDER — ONDANSETRON HCL 4 MG PO TABS: 4.0000 mg | ORAL_TABLET | Freq: Three times a day (TID) | ORAL | Status: DC | PRN

## 2016-11-01 MED ORDER — TAMSULOSIN HCL 0.4 MG PO CAPS
0.4000 mg | ORAL_CAPSULE | Freq: Every day | ORAL | Status: DC
  Administered 2016-11-02: 0.4 mg via ORAL
  Filled 2016-11-01: qty 1

## 2016-11-01 MED ORDER — NITROGLYCERIN 0.4 MG SL SUBL: 0.4000 mg | SUBLINGUAL_TABLET | SUBLINGUAL | Status: DC | PRN

## 2016-11-01 MED ORDER — ENOXAPARIN SODIUM 40 MG/0.4ML ~~LOC~~ SOLN
40.0000 mg | SUBCUTANEOUS | Status: DC
  Administered 2016-11-01: 40 mg via SUBCUTANEOUS
  Filled 2016-11-01: qty 0.4

## 2016-11-01 MED ORDER — MOMETASONE FURO-FORMOTEROL FUM 100-5 MCG/ACT IN AERO
2.0000 | INHALATION_SPRAY | Freq: Two times a day (BID) | RESPIRATORY_TRACT | Status: DC
  Administered 2016-11-02: 2 via RESPIRATORY_TRACT
  Filled 2016-11-01: qty 8.8

## 2016-11-01 MED ORDER — ASPIRIN EC 81 MG PO TBEC: 81.0000 mg | DELAYED_RELEASE_TABLET | Freq: Every day | ORAL | Status: DC

## 2016-11-01 MED ORDER — AMLODIPINE BESYLATE 5 MG PO TABS
5.0000 mg | ORAL_TABLET | Freq: Every day | ORAL | Status: DC
  Administered 2016-11-02: 5 mg via ORAL
  Filled 2016-11-01: qty 1

## 2016-11-01 MED ORDER — ATORVASTATIN CALCIUM 20 MG PO TABS: 20.0000 mg | ORAL_TABLET | Freq: Every day | ORAL | Status: DC

## 2016-11-01 MED ORDER — ASPIRIN 81 MG PO CHEW
324.0000 mg | CHEWABLE_TABLET | Freq: Once | ORAL | Status: AC
  Administered 2016-11-01: 324 mg via ORAL
  Filled 2016-11-01: qty 4

## 2016-11-01 MED ORDER — PNEUMOCOCCAL VAC POLYVALENT 25 MCG/0.5ML IJ INJ: 0.5000 mL | INJECTION | INTRAMUSCULAR | Status: DC

## 2016-11-01 MED ORDER — GUAIFENESIN-DM 100-10 MG/5ML PO SYRP: 5.0000 mL | ORAL_SOLUTION | ORAL | Status: DC | PRN

## 2016-11-01 MED ORDER — OXYCODONE HCL 5 MG PO TABS
5.0000 mg | ORAL_TABLET | Freq: Four times a day (QID) | ORAL | Status: DC | PRN
  Administered 2016-11-02: 5 mg via ORAL
  Filled 2016-11-01: qty 1

## 2016-11-01 NOTE — H&P (Addendum)
[place] [facility] [place] NAME: [name] [name]    MR#:  [id number]  DATE OF BIRTH:  03-24-1934  DATE OF ADMISSION:  2020-07-1317  PRIMARY CARE PHYSICIAN: Primary care physician in [place]  REQUESTING/REFERRING PHYSICIAN: Dr Harvest Dark  CHIEF COMPLAINT:   Chief Complaint  Patient presents with  . Chest Pain    HISTORY OF PRESENT ILLNESS:  [name] [name]  is a 83 y.o. male presented with chest pain. He was sitting at [employer] and he ate a sausage and egg biscuit and coffee. This was around 6:30 to 7 PM. He felt really dizzy and developed pain in the center of his chest. Pain also in the left arm. Both pains were described as sharp in nature lasting about 15-20 minutes about 5-6 out of 10 intensity. He felt really weak at the time and also some shortness of breath. No nausea or diaphoresis. He waited a couple hours prior to coming to the ER. His first troponin was negative. ER physician contacted [place] for evaluation.  PAST MEDICAL HISTORY:   Past Medical History:  Diagnosis Date  . ABSCESS 12/03/2009  . ABSCESS, FINGER 04/07/2010  . ANXIETY 11/03/2009  . ASTHMA 11/03/2009  . CHRONIC OBSTRUCTIVE PULMONARY DISEASE, ACUTE EXACERBATION 11/03/2009  . COPD 11/03/2009  . DEPRESSION 11/03/2009  . DIABETES MELLITUS, TYPE II 11/03/2009  . [place] DISEASE, LUMBAR 11/03/2009  . EMPHYSEMA, BULLOUS 11/03/2009  . GERD 11/03/2009  . HYPERLIPIDEMIA 11/03/2009  . HYPERTENSION 11/03/2009  . Kidney stones 01/30/12   "I've had them 7 times; always have passed them"  . PEPTIC ULCER DISEASE 11/03/2009  . Pneumonia   . RASH-NONVESICULAR 11/03/2009  . RESTLESS LEG SYNDROME 11/03/2009  . Shortness of breath    "sometimes; at any time"  . SPINAL STENOSIS, LUMBAR 11/03/2009    PAST SURGICAL HISTORY:   Past Surgical History:  Procedure Laterality Date  . [place]   left  . INGUINAL HERNIA REPAIR  10/2011   left  . ROTATOR CUFF REPAIR  2003   left  .  TONSILLECTOMY  1960    SOCIAL HISTORY:   Social History  Substance Use Topics  . Smoking status: Former Smoker    Packs/day: 1.00    Years: 41.00    Types: Cigarettes  . Smokeless tobacco: Never Used     Comment: "stopped smoking 04/21/1991 then restarted in 2012"  . Alcohol use No    FAMILY HISTORY:   Family History  Problem Relation Age of Onset  . Heart disease Father   . Heart disease Mother   . Cancer Brother     lung    DRUG ALLERGIES:  No Known Allergies  REVIEW OF SYSTEMS:  CONSTITUTIONAL: No fever, Positive cold feeling. Positive for weakness.  EYES: No blurred or double vision. Wears glasses EARS, NOSE, AND THROAT: No tinnitus or ear pain. No sore throat. Positive for runny nose RESPIRATORY: Positive for cough and shortness of breath, no wheezing or hemoptysis.  CARDIOVASCULAR: Positive for chest pain. No orthopnea, edema.  GASTROINTESTINAL: No nausea, vomiting, diarrhea or abdominal pain. No blood in bowel movements GENITOURINARY: No dysuria, hematuria.  ENDOCRINE: No polyuria, nocturia,  HEMATOLOGY: No anemia, easy bruising or bleeding SKIN: No rash or lesion. MUSCULOSKELETAL: Right hip pain   NEUROLOGIC: No tingling, numbness, weakness.  PSYCHIATRY: No anxiety or depression.   MEDICATIONS AT HOME:   Prior to Admission medications   Medication Sig Start Date End Date Taking? Authorizing Provider  albuterol-ipratropium (  COMBIVENT) 18-103 MCG/ACT inhaler Inhale 2 puffs into the lungs 4 (four) times daily. 03/19/12 03/19/13  Sheila Oats, MD  amLODipine (NORVASC) 5 MG tablet Take 5 mg by mouth daily.    Historical Provider, MD  atorvastatin (LIPITOR) 20 MG tablet Take 1 tablet (20 mg total) by mouth daily at 6 PM. 02/03/12 02/02/13  Samuella Cota, MD  Fluticasone-Salmeterol (ADVAIR DISKUS) 100-50 MCG/DOSE AEPB Inhale 1 puff into the lungs every 12 (twelve) hours. 08/13/11   Kathie Dike, MD  naproxen sodium (ANAPROX) 220 MG tablet Take 440 mg by mouth 2  (two) times daily with a meal. For headaches    Historical Provider, MD  Tamsulosin HCl (FLOMAX) 0.4 MG CAPS Take 1 capsule (0.4 mg total) by mouth daily after breakfast. 02/03/12   Samuella Cota, MD    Patient's only that he doesn't take any medications  VITAL SIGNS:  Blood pressure (!) 145/83, pulse 88, temperature 97.8 F (36.6 C), resp. rate 15, height '5\' 11"'$  (1.803 m), weight 80.7 kg (178 lb), SpO2 98 %.  PHYSICAL EXAMINATION:  GENERAL:  83 y.o.-year-old patient lying in the bed with no acute distress.  EYES: Pupils equal, round, reactive to light and accommodation. No scleral icterus. Extraocular muscles intact.  HEENT: Head atraumatic, normocephalic. Oropharynx and nasopharynx clear.  NECK:  Supple, no jugular venous distention. No thyroid enlargement, no tenderness.  LUNGS: Normal breath sounds bilaterally, no wheezing, rales,rhonchi or crepitation. No use of accessory muscles of respiration.  CARDIOVASCULAR: S1, S2 normal. No murmurs, rubs, or gallops.  ABDOMEN: Soft, nontender, nondistended. Bowel sounds present. No organomegaly or mass.  EXTREMITIES: No pedal edema, cyanosis, or clubbing. Good range of motion right hip. Pain over right hip joint and right sacroiliac area NEUROLOGIC: Cranial nerves II through XII are intact. Muscle strength 5/5 in all extremities. Sensation intact. Gait not checked.  PSYCHIATRIC: The patient is alert and oriented x 3.  SKIN: No rash, lesion, or ulcer.   LABORATORY PANEL:   CBC  Recent Labs Lab 11/01/16 1953  WBC 9.0  HGB 14.4  HCT 43.4  PLT 277   ------------------------------------------------------------------------------------------------------------------  Chemistries   Recent Labs Lab 11/01/16 1953  NA 139  K 3.9  CL 104  CO2 27  GLUCOSE 119*  BUN 20  CREATININE 1.23  CALCIUM 9.0   ------------------------------------------------------------------------------------------------------------------  Cardiac  Enzymes  Recent Labs Lab 11/01/16 1953  TROPONINI <0.03   ------------------------------------------------------------------------------------------------------------------  RADIOLOGY:  Dg Chest 2 View  Result Date: June 09, 202018 CLINICAL DATA:  Acute onset of generalized chest pain, radiating into the left arm. Dizziness. Initial encounter. EXAM: CHEST  2 VIEW COMPARISON:  Chest radiograph performed 05/16/2012 FINDINGS: The lungs are well-aerated. Mild right basilar opacity may reflect atelectasis. There is no evidence of pleural effusion or pneumothorax. Emphysema is noted at the left upper lung lobe. The heart is normal in size; the mediastinal contour is within normal limits. No acute osseous abnormalities are seen. IMPRESSION: Mild right basilar opacity may reflect atelectasis. Lungs otherwise grossly clear. Emphysema at the left upper lung lobe. Electronically Signed   By: Garald Balding M.D.   On: 0June 09, 202018 20:10    EKG:   Normal sinus rhythm with no acute ST-T wave changes  IMPRESSION AND PLAN:   1. Chest pain. Sounds more like acid reflux indigestion to me. It 2 more sets of cardiac enzymes. If both enzymes are negative will get stress test tomorrow morning. Aspirin given in the ER and I will continue that daily. 2. Type  2 diabetes mellitus diet controlled 3. Essential hypertension on Norvasc 4. COPD on Advair and albuterol 5. Hyperlipidemia unspecified on atorvastatin  All the records are reviewed and case discussed with ED provider. Management plans discussed with the patient, family and they are in agreement.  CODE STATUS: DO NOT RESUSCITATE  TOTAL TIME TAKING CARE OF THIS PATIENT: 50 minutes.    Loletha Grayer M.D on Jul 15, 202018 at 9:36 PM  Between 7am to 6pm - Pager - [phone]  After 6pm call admission pager [place] Office  [phone]

## 2016-11-01 NOTE — ED Provider Notes (Signed)
[facility] Emergency Department Provider Note  Time seen: 8:30 PM  I have reviewed the triage vital signs and the nursing notes.   HISTORY  Chief Complaint Chest Pain    HPI [name] [name] is a 83 y.o. male with a past medical history of anxiety, COPD, diabetes, hypertension, hyperlipidemia who presents to the emergency department for chest pain. According to the patient around 5:30 PM while eating dinner he developed acute onset of sudden central chest pain which she describes as significant and sharp. He states he got very lightheaded and nauseated thought he was going to pass out. He was able to get to his car and asked someone for directions to the hospital. He states while in the car he began having significant pain and aching down the left arm. Denies any shortness of breath or diaphoresis. Denies any prior cardiac history. Patient states shortly after arrival to the emergency department his symptoms improved and he now states minimal central chest discomfort at this time.  Past Medical History:  Diagnosis Date  . ABSCESS 12/03/2009  . ABSCESS, FINGER 04/07/2010  . ANXIETY 11/03/2009  . ASTHMA 11/03/2009  . CHRONIC OBSTRUCTIVE PULMONARY DISEASE, ACUTE EXACERBATION 11/03/2009  . COPD 11/03/2009  . DEPRESSION 11/03/2009  . DIABETES MELLITUS, TYPE II 11/03/2009  . [place] DISEASE, LUMBAR 11/03/2009  . EMPHYSEMA, BULLOUS 11/03/2009  . GERD 11/03/2009  . HYPERLIPIDEMIA 11/03/2009  . HYPERTENSION 11/03/2009  . Kidney stones 01/30/12   "I've had them 7 times; always have passed them"  . PEPTIC ULCER DISEASE 11/03/2009  . Pneumonia   . RASH-NONVESICULAR 11/03/2009  . RESTLESS LEG SYNDROME 11/03/2009  . Shortness of breath    "sometimes; at any time"  . SPINAL STENOSIS, LUMBAR 11/03/2009    Patient Active Problem List   Diagnosis Date Noted  . PNA (pneumonia) 03/17/2012  . Weakness generalized 03/17/2012  . Generalized weakness 01/30/2012  . Fall at home 01/30/2012  .  Physical deconditioning 01/30/2012  . Nausea vomiting and diarrhea 01/15/2012  . UTI (urinary tract infection) 01/15/2012  . Cocaine abuse 08/11/2011  . Tobacco abuse 08/11/2011  . Orthostasis 12/23/2010  . Dehydration 12/23/2010  . Abdominal pain, other specified site 12/23/2010  . Dizziness 12/23/2010  . Weight loss 12/23/2010  . Left lumbar radiculopathy 12/23/2010  . DIABETES MELLITUS, TYPE II 11/03/2009  . HYPERLIPIDEMIA 11/03/2009  . ANXIETY 11/03/2009  . DEPRESSION 11/03/2009  . RESTLESS LEG SYNDROME 11/03/2009  . HYPERTENSION 11/03/2009  . EMPHYSEMA, BULLOUS 11/03/2009  . ASTHMA 11/03/2009  . COPD 11/03/2009  . GERD 11/03/2009  . PEPTIC ULCER DISEASE 11/03/2009  . [place] DISEASE, LUMBAR 11/03/2009  . SPINAL STENOSIS, LUMBAR 11/03/2009  . NEPHROLITHIASIS, HX OF 11/03/2009    Past Surgical History:  Procedure Laterality Date  . [place]   left  . INGUINAL HERNIA REPAIR  10/2011   left  . ROTATOR CUFF REPAIR  2003   left  . TONSILLECTOMY  1960    Prior to Admission medications   Medication Sig Start Date End Date Taking? Authorizing Provider  albuterol-ipratropium (COMBIVENT) 18-103 MCG/ACT inhaler Inhale 2 puffs into the lungs 4 (four) times daily. 03/19/12 03/19/13  Sheila Oats, MD  amLODipine (NORVASC) 5 MG tablet Take 5 mg by mouth daily.    Historical Provider, MD  atorvastatin (LIPITOR) 20 MG tablet Take 1 tablet (20 mg total) by mouth daily at 6 PM. 02/03/12 02/02/13  Samuella Cota, MD  Fluticasone-Salmeterol (ADVAIR DISKUS) 100-50 MCG/DOSE AEPB Inhale  1 puff into the lungs every 12 (twelve) hours. 08/13/11   Kathie Dike, MD  naproxen sodium (ANAPROX) 220 MG tablet Take 440 mg by mouth 2 (two) times daily with a meal. For headaches    Historical Provider, MD  Tamsulosin HCl (FLOMAX) 0.4 MG CAPS Take 1 capsule (0.4 mg total) by mouth daily after breakfast. 02/03/12   Samuella Cota, MD    No Known Allergies  Family History  Problem  Relation Age of Onset  . Heart disease Mother   . Cancer Brother     lung    Social History Social History  Substance Use Topics  . Smoking status: Former Smoker    Packs/day: 1.00    Years: 41.00    Types: Cigarettes  . Smokeless tobacco: Never Used     Comment: "stopped smoking 04/21/1991 then restarted in 2012"  . Alcohol use Yes     Comment: 01/30/12 "might go a year or 2 then drink a couple beers"    Review of Systems Constitutional: Negative for fever. Cardiovascular: Positive for chest pain, improved Respiratory: Negative for shortness of breath. Gastrointestinal: Negative for abdominal pain. Positive for nausea. Neurological: Negative for headache 10-point ROS otherwise negative.  ____________________________________________   PHYSICAL EXAM:  VITAL SIGNS: ED Triage Vitals  Enc Vitals Group     BP 11/01/16 1956 110/68     Pulse Rate 11/01/16 1956 97     Resp 11/01/16 1956 18     Temp 11/01/16 1956 97.8 F (36.6 C)     Temp src --      SpO2 11/01/16 1956 99 %     Weight 11/01/16 1952 178 lb (80.7 kg)     Height 11/01/16 1952 '5\' 11"'$  (1.803 m)     Head Circumference --      Peak Flow --      Pain Score 11/01/16 1952 6     Pain Loc --      Pain Edu? --      Excl. in [place]? --     Constitutional: Alert and oriented. Well appearing and in no distress. Eyes: Normal exam ENT   Head: Normocephalic and atraumatic   Mouth/Throat: Mucous membranes are moist. Cardiovascular: Normal rate, regular rhythm. No murmur Respiratory: Normal respiratory effort without tachypnea nor retractions. Breath sounds are clear  Gastrointestinal: Soft and nontender. No distention.   Musculoskeletal: Nontender with normal range of motion in all extremities. Neurologic:  Normal speech and language. No gross focal neurologic deficits  Skin:  Skin is warm, dry and intact.  Psychiatric: Mood and affect are normal.  ____________________________________________    EKG  EKG  reviewed and interpreted by myself shows normal sinus rhythm at 100 bpm with narrow QRS, normal axis, normal intervals, nonspecific ST changes. No ST elevation.  ____________________________________________    RADIOLOGY  Chest x-ray shows right-sided atelectasis.  ____________________________________________   INITIAL IMPRESSION / ASSESSMENT AND PLAN / ED COURSE  Pertinent labs & imaging results that were available during my care of the patient were reviewed by me and considered in my medical decision making (see chart for details).  The patient presents to the emergency Department with concerning chest pain which she describes as central with left arm radiation and nausea and lightheadedness with a feeling that he was going to pass out. Symptoms lasted approximately one hour before beginning to resolve. Continues today very mild symptoms at this time but much improved from earlier. Patient's labs are negative, troponin negative, chest x-ray largely negative,  EKG shows nonspecific changes but no ST elevation. Given the patient's age and comorbidities with concerning story for ACS patient will be admitted to the hospital for chest pain workup.  ____________________________________________   FINAL CLINICAL IMPRESSION(S) / ED DIAGNOSES  Chest pain    Harvest Dark, MD 11/01/16 2036

## 2016-11-01 NOTE — ED Triage Notes (Signed)
Pt to triage via w/c with no distress noted; st onset mid CP radiating into left arm 2hrs ago while sitting in [employer]; st pain accomp by dizziness

## 2016-11-01 NOTE — ED Notes (Signed)
ED Provider at bedside. 

## 2016-11-01 NOTE — ED Notes (Signed)
Patient transported to X-ray 

## 2016-11-01 NOTE — Progress Notes (Signed)
Patient arrived to 2A Room 237. Patient denies pain and all questions answered. Patient oriented to unit and Fall Safety Plan signed. Skin assessment completed with Tunji RN and skin intact. A&Ox4, VSS, and NSR on verified tele-box #[id number]. Nursing staff will continue to monitor for any changes in patient status. Earleen Reaper, RN

## 2016-11-02 ENCOUNTER — Encounter: Payer: Self-pay | Admitting: Radiology

## 2016-11-02 ENCOUNTER — Observation Stay: Payer: Medicare (Managed Care)

## 2016-11-02 LAB — BASIC METABOLIC PANEL
ANION GAP: 6 (ref 5–15)
BUN: 19 mg/dL (ref 6–20)
CHLORIDE: 108 mmol/L (ref 101–111)
CO2: 26 mmol/L (ref 22–32)
Calcium: 8.9 mg/dL (ref 8.9–10.3)
Creatinine, Ser: 1.07 mg/dL (ref 0.61–1.24)
GFR calc non Af Amer: >60 mL/min (ref >60)
Glucose, Bld: 81 mg/dL (ref 65–99)
POTASSIUM: 3.4 mmol/L — AB (ref 3.5–5.1)
SODIUM: 140 mmol/L (ref 135–145)

## 2016-11-02 LAB — TROPONIN I: Troponin I: <0.03 ng/mL (ref <0.03)

## 2016-11-02 LAB — CBC
HCT: 40.3 % (ref 40.0–52.0)
HEMOGLOBIN: 13.7 g/dL (ref 13.0–18.0)
MCH: 30.2 pg (ref 26.0–34.0)
MCHC: 33.9 g/dL (ref 32.0–36.0)
MCV: 89.1 fL (ref 80.0–100.0)
Platelets: 250 10*3/uL (ref 150–440)
RBC: 4.52 MIL/uL (ref 4.40–5.90)
RDW: 14 % (ref 11.5–14.5)
WBC: 6.8 10*3/uL (ref 3.8–10.6)

## 2016-11-02 LAB — NM MYOCAR MULTI W/SPECT W/WALL MOTION / EF
CHL CUP NUCLEAR SRS: 1
CHL CUP NUCLEAR SSS: 1
LV sys vol: 12 mL
LVDIAVOL: 42 mL (ref 62–150)
SDS: 0
TID: 1.22

## 2016-11-02 LAB — LIPID PANEL
CHOL/HDL RATIO: 3.4 ratio
Cholesterol: 178 mg/dL (ref 0–200)
HDL: 52 mg/dL (ref >40)
LDL Cholesterol: 102 mg/dL — ABNORMAL HIGH (ref 0–99)
Triglycerides: 121 mg/dL (ref <150)
VLDL: 24 mg/dL (ref 0–40)

## 2016-11-02 MED ORDER — REGADENOSON 0.4 MG/5ML IV SOLN
0.4000 mg | Freq: Once | INTRAVENOUS | Status: AC
  Administered 2016-11-02: 0.4 mg via INTRAVENOUS

## 2016-11-02 MED ORDER — ACETAMINOPHEN 325 MG PO TABS
650.0000 mg | ORAL_TABLET | Freq: Four times a day (QID) | ORAL | Status: DC | PRN
  Administered 2016-11-02: 650 mg via ORAL
  Filled 2016-11-02: qty 2

## 2016-11-02 MED ORDER — TECHNETIUM TC 99M TETROFOSMIN IV KIT
31.9040 | PACK | Freq: Once | INTRAVENOUS | Status: AC | PRN
  Administered 2016-11-02: 31.904 via INTRAVENOUS

## 2016-11-02 MED ORDER — TECHNETIUM TC 99M TETROFOSMIN IV KIT
13.4190 | PACK | Freq: Once | INTRAVENOUS | Status: AC | PRN
  Administered 2016-11-02: 13.419 via INTRAVENOUS

## 2016-11-02 NOTE — Progress Notes (Signed)
Patient given discharge teaching and paperwork regarding medications, diet, follow-up appointments and activity. Patient understanding verbalized. No complaints at this time. . IV and telemetry discontinued prior to leaving. Skin assessment as previously charted and vitals are stable; on room air. Patient being discharged to home.

## 2016-11-02 NOTE — Progress Notes (Signed)
Patient back from stress test. Dr. Verdell Carmine aware. Patient requesting tylenol for headache and a diet order. Ok to order per MD.

## 2016-11-02 NOTE — Discharge Summary (Signed)
[facility] NAME: [name] [name]    MR#:  [id number]  DATE OF BIRTH:  31-Jan-1934  DATE OF ADMISSION:  08-03-2017 ADMITTING PHYSICIAN: Loletha Grayer, MD  DATE OF DISCHARGE: 11/02/2016  PRIMARY CARE PHYSICIAN: Pcp Not In System    ADMISSION DIAGNOSIS:  Chest pain [R07.9] Chest pain, unspecified type [R07.9]  DISCHARGE DIAGNOSIS:  Active Problems:   Chest pain   SECONDARY DIAGNOSIS:   Past Medical History:  Diagnosis Date  . ABSCESS 12/03/2009  . ABSCESS, FINGER 04/07/2010  . ANXIETY 11/03/2009  . ASTHMA 11/03/2009  . CHRONIC OBSTRUCTIVE PULMONARY DISEASE, ACUTE EXACERBATION 11/03/2009  . COPD 11/03/2009  . DEPRESSION 11/03/2009  . DIABETES MELLITUS, TYPE II 11/03/2009  . [place] DISEASE, LUMBAR 11/03/2009  . EMPHYSEMA, BULLOUS 11/03/2009  . GERD 11/03/2009  . HYPERLIPIDEMIA 11/03/2009  . HYPERTENSION 11/03/2009  . Kidney stones 01/30/12   "I've had them 7 times; always have passed them"  . PEPTIC ULCER DISEASE 11/03/2009  . Pneumonia   . RASH-NONVESICULAR 11/03/2009  . RESTLESS LEG SYNDROME 11/03/2009  . Shortness of breath    "sometimes; at any time"  . SPINAL STENOSIS, LUMBAR 11/03/2009    HOSPITAL COURSE:   83 year old male with past medical history of anxiety, COPD, depression, hypertension, hyperlipidemia, restless leg syndrome who presented to the hospital due to chest pain.  1. Chest pain-patient was observed overnight on telemetry, had serial cardiac markers checked which were negative. -Patient underwent a nuclear medicine stress test which was negative for any acute abnormalities. He is clinically asymptomatic and hemodynamically stable and therefore being discharged home. Patient chest pain was related to dyspepsia/GERD.  DISCHARGE CONDITIONS:   Stable.   CONSULTS OBTAINED:    DRUG ALLERGIES:  No Known Allergies  DISCHARGE MEDICATIONS:   Allergies as of 11/02/2016   No Known Allergies     Medication List     You have not been prescribed any medications.       DISCHARGE INSTRUCTIONS:   DIET:  Regular diet  DISCHARGE CONDITION:  Stable  ACTIVITY:  Activity as tolerated  OXYGEN:  Home Oxygen: No.   Oxygen Delivery: room air  DISCHARGE LOCATION:  home   If you experience worsening of your admission symptoms, develop shortness of breath, life threatening emergency, suicidal or homicidal thoughts you must seek medical attention immediately by calling 911 or calling your MD immediately  if symptoms less severe.  You Must read complete instructions/literature along with all the possible adverse reactions/side effects for all the Medicines you take and that have been prescribed to you. Take any new Medicines after you have completely understood and accpet all the possible adverse reactions/side effects.   Please note  You were cared for by a hospitalist during your hospital stay. If you have any questions about your discharge medications or the care you received while you were in the hospital after you are discharged, you can call the unit and asked to speak with the hospitalist on call if the hospitalist that took care of you is not available. Once you are discharged, your primary care physician will handle any further medical issues. Please note that NO REFILLS for any discharge medications will be authorized once you are discharged, as it is imperative that you return to your primary care physician (or establish a relationship with a primary care physician if you do not have one) for your aftercare needs so that they can reassess your need for medications and monitor your lab  values.     Today   No chest pain presently.  No acute complaints.  Stress test (-). Will d/c home today.   VITAL SIGNS:  Blood pressure (!) 163/87, pulse 89, temperature 98.5 F (36.9 C), temperature source Oral, resp. rate 14, height '5\' 11"'$  (1.803 m), weight 77.3 kg (170 lb 6.4 oz), SpO2 100 %.  I/O:  No  intake or output data in the 24 hours ending 11/02/16 1407  PHYSICAL EXAMINATION:  GENERAL:  83 y.o.-year-old patient lying in the bed with no acute distress.  EYES: Pupils equal, round, reactive to light and accommodation. No scleral icterus. Extraocular muscles intact.  HEENT: Head atraumatic, normocephalic. Oropharynx and nasopharynx clear.  NECK:  Supple, no jugular venous distention. No thyroid enlargement, no tenderness.  LUNGS: Normal breath sounds bilaterally, no wheezing, rales,rhonchi. No use of accessory muscles of respiration.  CARDIOVASCULAR: S1, S2 normal. No murmurs, rubs, or gallops.  ABDOMEN: Soft, non-tender, non-distended. Bowel sounds present. No organomegaly or mass.  EXTREMITIES: No pedal edema, cyanosis, or clubbing.  NEUROLOGIC: Cranial nerves II through XII are intact. No focal motor or sensory defecits b/l.  PSYCHIATRIC: The patient is alert and oriented x 3. Good affect.  SKIN: No obvious rash, lesion, or ulcer.   DATA REVIEW:   CBC  Recent Labs Lab 11/02/16 0302  WBC 6.8  HGB 13.7  HCT 40.3  PLT 250    Chemistries   Recent Labs Lab 11/02/16 0302  NA 140  K 3.4*  CL 108  CO2 26  GLUCOSE 81  BUN 19  CREATININE 1.07  CALCIUM 8.9    Cardiac Enzymes  Recent Labs Lab 11/02/16 0748  TROPONINI <0.03      RADIOLOGY:  Dg Chest 2 View  Result Date: 03-29-202018 CLINICAL DATA:  Acute onset of generalized chest pain, radiating into the left arm. Dizziness. Initial encounter. EXAM: CHEST  2 VIEW COMPARISON:  Chest radiograph performed 05/16/2012 FINDINGS: The lungs are well-aerated. Mild right basilar opacity may reflect atelectasis. There is no evidence of pleural effusion or pneumothorax. Emphysema is noted at the left upper lung lobe. The heart is normal in size; the mediastinal contour is within normal limits. No acute osseous abnormalities are seen. IMPRESSION: Mild right basilar opacity may reflect atelectasis. Lungs otherwise grossly clear.  Emphysema at the left upper lung lobe. Electronically Signed   By: Garald Balding M.D.   On: 003-29-202018 20:10   Nm Myocar Multi W/spect W/wall Motion / Ef  Result Date: 11/02/2016  Blood pressure demonstrated a normal response to exercise.  There was no ST segment deviation noted during stress.  The study is normal.  This is a low risk study.  The left ventricular ejection fraction is normal (55-65%).       Management plans discussed with the patient, family and they are in agreement.  CODE STATUS:     Code Status Orders        Start     Ordered   11/01/16 2123  Do not attempt resuscitation (DNR)  Continuous    Question Answer Comment  In the event of cardiac or respiratory ARREST Do not call a "code blue"   In the event of cardiac or respiratory ARREST Do not perform Intubation, CPR, defibrillation or ACLS   In the event of cardiac or respiratory ARREST Use medication by any route, position, wound care, and other measures to relive pain and suffering. May use oxygen, suction and manual treatment of airway obstruction as needed for comfort.  Comments nurse may pronounce      11/01/16 2122    Code Status History    Date Active Date Inactive Code Status Order ID Comments User Context   03/17/2012  5:42 AM 03/19/2012  8:14 PM Full Code [id number]  Marylou Mccoy, RN Inpatient   01/30/2012  4:56 PM 02/03/2012  6:59 PM Full Code [id number]  Murlean Iba, MD Inpatient   08/11/2011  6:03 PM 08/13/2011  6:35 PM Full Code [id number]  Loyal Gambler, RN Inpatient      TOTAL TIME TAKING CARE OF THIS PATIENT: 40 minutes.    Henreitta Leber M.D on 11/02/2016 at 2:07 PM  Between 7am to 6pm - Pager - [phone]  After 6pm go to [email or web address] - Proofreader  [facility]  Office  [phone]  CC: Primary care physician; Pcp Not In System

## 2016-11-02 NOTE — Progress Notes (Signed)
Per patient request, friend Andree Moro [phone]) was  informed that patient is in hospital.

## 2016-11-02 NOTE — Care Management Obs Status (Signed)
[facility] NOTIFICATION   Patient Details  Name: [name] [name] MRN: [id number] Date of Birth: 03/05/1934   Medicare Observation Status Notification Given:  No  < 24 hours    Katrina Stack, RN 11/02/2016, 2:36 PM

## 2016-11-15 ENCOUNTER — Emergency Department (HOSPITAL_COMMUNITY)
Admission: EM | Admit: 2016-11-15 | Discharge: 2016-11-16 | Disposition: A | Discharge status: Home / Self Care | Payer: Medicare (Managed Care) | Attending: Emergency Medicine | Admitting: Emergency Medicine

## 2016-11-15 ENCOUNTER — Encounter (HOSPITAL_COMMUNITY): Payer: Self-pay | Admitting: Emergency Medicine

## 2016-11-15 ENCOUNTER — Emergency Department (HOSPITAL_COMMUNITY): Payer: Medicare (Managed Care)

## 2016-11-15 DIAGNOSIS — I951 Orthostatic hypotension: Secondary | ICD-10-CM | POA: Diagnosis not present

## 2016-11-15 DIAGNOSIS — E119 Type 2 diabetes mellitus without complications: Secondary | ICD-10-CM | POA: Diagnosis not present

## 2016-11-15 DIAGNOSIS — Z7982 Long term (current) use of aspirin: Secondary | ICD-10-CM | POA: Insufficient documentation

## 2016-11-15 DIAGNOSIS — R0602 Shortness of breath: Secondary | ICD-10-CM | POA: Insufficient documentation

## 2016-11-15 DIAGNOSIS — Z87891 Personal history of nicotine dependence: Secondary | ICD-10-CM | POA: Insufficient documentation

## 2016-11-15 DIAGNOSIS — J45909 Unspecified asthma, uncomplicated: Secondary | ICD-10-CM | POA: Insufficient documentation

## 2016-11-15 DIAGNOSIS — Z79899 Other long term (current) drug therapy: Secondary | ICD-10-CM | POA: Insufficient documentation

## 2016-11-15 DIAGNOSIS — J449 Chronic obstructive pulmonary disease, unspecified: Secondary | ICD-10-CM | POA: Insufficient documentation

## 2016-11-15 DIAGNOSIS — R002 Palpitations: Secondary | ICD-10-CM | POA: Insufficient documentation

## 2016-11-15 LAB — CBC WITH DIFFERENTIAL/PLATELET
BASOS PCT: 1 %
Basophils Absolute: 0.1 10*3/uL (ref 0.0–0.1)
EOS PCT: 6 %
Eosinophils Absolute: 0.5 10*3/uL (ref 0.0–0.7)
HEMATOCRIT: 43.7 % (ref 39.0–52.0)
Hemoglobin: 14.9 g/dL (ref 13.0–17.0)
Lymphocytes Relative: 25 %
Lymphs Abs: 2 10*3/uL (ref 0.7–4.0)
MCH: 30.8 pg (ref 26.0–34.0)
MCHC: 34.1 g/dL (ref 30.0–36.0)
MCV: 90.5 fL (ref 78.0–100.0)
Monocytes Absolute: 0.6 10*3/uL (ref 0.1–1.0)
Monocytes Relative: 7 %
NEUTROS PCT: 61 %
Neutro Abs: 5 10*3/uL (ref 1.7–7.7)
PLATELETS: 255 10*3/uL (ref 150–400)
RBC: 4.83 MIL/uL (ref 4.22–5.81)
RDW: 13.7 % (ref 11.5–15.5)
WBC: 8.2 10*3/uL (ref 4.0–10.5)

## 2016-11-15 LAB — D-DIMER, QUANTITATIVE: D-Dimer, Quant: 0.68 ug/mL-FEU — ABNORMAL HIGH (ref 0.00–0.50)

## 2016-11-15 NOTE — ED Triage Notes (Signed)
Heart racing on and off and weakness x1week

## 2016-11-15 NOTE — ED Provider Notes (Signed)
[facility] DEPT Provider Note   CSN: [id number] Arrival date & time: 11/15/16  2224   By signing my name below, I, Hilbert Odor, attest that this documentation has been prepared under the direction and in the presence of Ezequiel Essex, MD. Electronically Signed: Hilbert Odor, Scribe. 11/15/16. 11:38 PM. History   Chief Complaint Chief Complaint  Patient presents with  . Palpitations    The history is provided by the patient and a relative. No language interpreter was used.   HPI Comments: [name] [name] is a 83 y.o. male who presents to the Emergency Department complaining of intermittent palpitations for the past week. He was recently seen at [facility] for CP and fast heart rate and no illnesses were noted at that time. He denies any current pain. He reports an episode today of heart racing x 2 today. He states that this didn't happen yesterday. He states that when this happens, it typically only lasts for around 5 minutes at a time. He states that a similar episode happened last week and he felt dizzy at that time and had a near syncope episode due to his racing heart. He also reports having SOB which is not new. The patient reports having an episode of vomiting yesterday after a severe episode of coughing. He states that he has been eating fine. He has hx of COPD, diabetes, and HTN. He has never had an MI. He denies hx thyroid problems and cardiac problems. He is currently on ASA and ProAir everyday.  Past Medical History:  Diagnosis Date  . ABSCESS 12/03/2009  . ABSCESS, FINGER 04/07/2010  . ANXIETY 11/03/2009  . ASTHMA 11/03/2009  . CHRONIC OBSTRUCTIVE PULMONARY DISEASE, ACUTE EXACERBATION 11/03/2009  . COPD 11/03/2009  . DEPRESSION 11/03/2009  . DIABETES MELLITUS, TYPE II 11/03/2009  . [place] DISEASE, LUMBAR 11/03/2009  . EMPHYSEMA, BULLOUS 11/03/2009  . GERD 11/03/2009  . HYPERLIPIDEMIA 11/03/2009  . HYPERTENSION 11/03/2009  . Kidney stones 01/30/12   "I've had them 7 times;  always have passed them"  . PEPTIC ULCER DISEASE 11/03/2009  . Pneumonia   . RASH-NONVESICULAR 11/03/2009  . RESTLESS LEG SYNDROME 11/03/2009  . Shortness of breath    "sometimes; at any time"  . SPINAL STENOSIS, LUMBAR 11/03/2009    Patient Active Problem List   Diagnosis Date Noted  . Chest pain 23-Jun-202018  . PNA (pneumonia) 03/17/2012  . Weakness generalized 03/17/2012  . Generalized weakness 01/30/2012  . Fall at home 01/30/2012  . Physical deconditioning 01/30/2012  . Nausea vomiting and diarrhea 01/15/2012  . UTI (urinary tract infection) 01/15/2012  . Cocaine abuse 08/11/2011  . Tobacco abuse 08/11/2011  . Orthostasis 12/23/2010  . Dehydration 12/23/2010  . Abdominal pain, other specified site 12/23/2010  . Dizziness 12/23/2010  . Weight loss 12/23/2010  . Left lumbar radiculopathy 12/23/2010  . DIABETES MELLITUS, TYPE II 11/03/2009  . HYPERLIPIDEMIA 11/03/2009  . ANXIETY 11/03/2009  . DEPRESSION 11/03/2009  . RESTLESS LEG SYNDROME 11/03/2009  . HYPERTENSION 11/03/2009  . EMPHYSEMA, BULLOUS 11/03/2009  . ASTHMA 11/03/2009  . COPD 11/03/2009  . GERD 11/03/2009  . PEPTIC ULCER DISEASE 11/03/2009  . [place] DISEASE, LUMBAR 11/03/2009  . SPINAL STENOSIS, LUMBAR 11/03/2009  . NEPHROLITHIASIS, HX OF 11/03/2009    Past Surgical History:  Procedure Laterality Date  . [place]   left  . INGUINAL HERNIA REPAIR  10/2011   left  . ROTATOR CUFF REPAIR  2003   left  . TONSILLECTOMY  1960  Home Medications    Prior to Admission medications   Medication Sig Start Date End Date Taking? Authorizing Provider  albuterol (PROVENTIL HFA;VENTOLIN HFA) 108 (90 Base) MCG/ACT inhaler Inhale 1-2 puffs into the lungs every 6 (six) hours as needed for wheezing or shortness of breath.  06/03/13  Yes Historical Provider, MD  aspirin EC 81 MG tablet Take 81 mg by mouth daily.   Yes Historical Provider, MD  lisinopril (PRINIVIL,ZESTRIL) 10 MG tablet Take 10 mg by  mouth daily.   Yes Historical Provider, MD    Family History Family History  Problem Relation Age of Onset  . Heart disease Father   . Heart disease Mother   . Cancer Brother     lung    Social History Social History  Substance Use Topics  . Smoking status: Former Smoker    Packs/day: 1.00    Years: 41.00    Types: Cigarettes  . Smokeless tobacco: Never Used     Comment: "stopped smoking 04/21/1991 then restarted in 2012"  . Alcohol use No     Allergies   Patient has no known allergies.   Review of Systems Review of Systems A complete 10 system review of systems was obtained and all systems are negative except as noted in the HPI and PMH.   Physical Exam Updated Vital Signs BP 102/89 (BP Location: Left Arm)   Pulse 88   Temp 97.4 F (36.3 C) (Oral)   Resp 18   Ht '5\' 11"'$  (1.803 m)   Wt 178 lb (80.7 kg)   SpO2 97%   BMI 24.83 kg/m   Physical Exam  Constitutional: He is oriented to person, place, and time. He appears well-developed and well-nourished. No distress.  HENT:  Head: Normocephalic and atraumatic.  Mouth/Throat: Oropharynx is clear and moist. No oropharyngeal exudate.  Eyes: Conjunctivae and EOM are normal. Pupils are equal, round, and reactive to light.  Neck: Normal range of motion. Neck supple.  No meningismus.  Cardiovascular: Normal rate, regular rhythm, normal heart sounds and intact distal pulses.   No murmur heard. No murmurs.  Pulmonary/Chest: Effort normal and breath sounds normal. No respiratory distress.  Abdominal: Soft. There is no tenderness. There is no rebound and no guarding.  Musculoskeletal: Normal range of motion. He exhibits no edema or tenderness.  Neurological: He is alert and oriented to person, place, and time. No cranial nerve deficit. He exhibits normal muscle tone. Coordination normal.   5/5 strength throughout. CN 2-12 intact.Equal grip strength. No nystagmus. Negative Romberg. Normal gait.   Skin: Skin is warm.    Psychiatric: He has a normal mood and affect. His behavior is normal.  Nursing note and vitals reviewed.    ED Treatments / Results  DIAGNOSTIC STUDIES: Oxygen Saturation is 97% on RA, normal by my interpretation.    COORDINATION OF CARE: 11:09 PM Discussed treatment plan with pt at bedside and pt agreed to plan. I will check the patient's EKG, CXR, and labs.  Labs (all labs ordered are listed, but only abnormal results are displayed) Labs Reviewed  BASIC METABOLIC PANEL - Abnormal; Notable for the following:       Result Value   Sodium 134 (*)    All other components within normal limits  D-DIMER, QUANTITATIVE (NOT AT [facility]) - Abnormal; Notable for the following:    D-Dimer, Quant 0.68 (*)    All other components within normal limits  CBC WITH DIFFERENTIAL/PLATELET  TROPONIN I  TSH  BRAIN NATRIURETIC PEPTIDE  EKG  EKG Interpretation  Date/Time:  Tuesday November 15 2016 22:33:21 EST Ventricular Rate:  89 PR Interval:    QRS Duration: 93 QT Interval:  390 QTC Calculation: 475 R Axis:   72 Text Interpretation:  Sinus rhythm No significant change was found Confirmed by Wyvonnia Dusky  MD, Marcena Dias [phone]) on 11/15/2016 10:54:37 PM       Radiology Dg Chest 2 View  Result Date: 11/16/2016 CLINICAL DATA:  Acute onset of generalized weakness and intermittent tachycardia. Generalized chest pain. Initial encounter. EXAM: CHEST  2 VIEW COMPARISON:  Chest radiograph performed 11/13/2016 FINDINGS: The lungs are well-aerated. Mild chronic bibasilar interstitial changes are noted. Emphysema is seen at the left lung apex. There is no evidence of pleural effusion or pneumothorax. The heart is normal in size; the mediastinal contour is within normal limits. No acute osseous abnormalities are seen. IMPRESSION: Mild chronic interstitial changes noted. Emphysema at the left lung apex. Would correlate for any symptoms to suggest interstitial lung disease. Electronically Signed   By: Garald Balding M.D.   On: 11/16/2016 00:18    Procedures Procedures (including critical care time)  Medications Ordered in ED Medications - No data to display   Initial Impression / Assessment and Plan / ED Course  I have reviewed the triage vital signs and the nursing notes.  Pertinent labs & imaging results that were available during my care of the patient were reviewed by me and considered in my medical decision making (see chart for details).   patient presents with a one-week history of intermittent sensation of racing heart that occurs multiple times per day lasting a few minutes at a time. No chest pain. Some shortness of breath and lightheadedness. No nausea or vomiting. No diarrhea. Recent admission to [facility] 2 weeks ago and had negative stress test.  Labs reassuring. Orthostatics positive by blood pressure. EKG unchanged. Troponin negative. D-dimer (age-adjusted) negative.  Patient given IVF.  He is not on a beta blocker.  EF was normal during recent stress test.   Patient denies any syncope.  He is ambulatory and tolerating PO.  He needs to establish care with a pCP if he going to remain in this area. Also refer to cardiology for possible holter monitor placement. Return precautions discussed.  Final Clinical Impressions(s) / ED Diagnoses   Final diagnoses:  Palpitations  Orthostasis    New Prescriptions New Prescriptions   No medications on file  I personally performed the services described in this documentation, which was scribed in my presence. The recorded information has been reviewed and is accurate.    Ezequiel Essex, MD 11/16/16 0930

## 2016-11-16 LAB — BASIC METABOLIC PANEL
Anion gap: 7 (ref 5–15)
BUN: 20 mg/dL (ref 6–20)
CALCIUM: 9.1 mg/dL (ref 8.9–10.3)
CO2: 24 mmol/L (ref 22–32)
Chloride: 103 mmol/L (ref 101–111)
Creatinine, Ser: 1.1 mg/dL (ref 0.61–1.24)
GFR calc Af Amer: >60 mL/min (ref >60)
Glucose, Bld: 92 mg/dL (ref 65–99)
Potassium: 3.9 mmol/L (ref 3.5–5.1)
SODIUM: 134 mmol/L — AB (ref 135–145)

## 2016-11-16 LAB — BRAIN NATRIURETIC PEPTIDE: B Natriuretic Peptide: 25 pg/mL (ref 0.0–100.0)

## 2016-11-16 LAB — TSH: TSH: 2.206 u[IU]/mL (ref 0.350–4.500)

## 2016-11-16 LAB — TROPONIN I

## 2016-11-16 MED ORDER — SODIUM CHLORIDE 0.9 % IV BOLUS (SEPSIS)
1000.0000 mL | Freq: Once | INTRAVENOUS | Status: AC
  Administered 2016-11-16: 1000 mL via INTRAVENOUS

## 2016-11-16 NOTE — Discharge Instructions (Signed)
Keep yourself hydrated. Follow up with the cardiologist as you may need to wear a heart monitor at home. Return to the ED if you develop new or worsening symptoms.

## 2016-11-16 NOTE — ED Notes (Signed)
Pt got 1 liter of ns iv pt walked to the bathroom. Pt c/o of a little lightheadedness but no s/s is obverse when the patient walking to the bathroom.

## 2016-11-16 NOTE — ED Notes (Signed)
Pt sleeping in bed until daylight for his safety driving home. Pt resting in bed no c/o at this time

## 2016-12-08 ENCOUNTER — Emergency Department (HOSPITAL_COMMUNITY): Payer: Medicare (Managed Care)

## 2016-12-08 ENCOUNTER — Emergency Department (HOSPITAL_COMMUNITY)
Admission: EM | Admit: 2016-12-08 | Discharge: 2016-12-09 | Disposition: A | Discharge status: Home / Self Care | Payer: Medicare (Managed Care) | Attending: Emergency Medicine | Admitting: Emergency Medicine

## 2016-12-08 ENCOUNTER — Encounter (HOSPITAL_COMMUNITY): Payer: Self-pay

## 2016-12-08 DIAGNOSIS — Z87891 Personal history of nicotine dependence: Secondary | ICD-10-CM | POA: Insufficient documentation

## 2016-12-08 DIAGNOSIS — Z7982 Long term (current) use of aspirin: Secondary | ICD-10-CM | POA: Insufficient documentation

## 2016-12-08 DIAGNOSIS — J441 Chronic obstructive pulmonary disease with (acute) exacerbation: Secondary | ICD-10-CM

## 2016-12-08 DIAGNOSIS — R002 Palpitations: Secondary | ICD-10-CM | POA: Diagnosis not present

## 2016-12-08 DIAGNOSIS — Z79899 Other long term (current) drug therapy: Secondary | ICD-10-CM | POA: Insufficient documentation

## 2016-12-08 DIAGNOSIS — E119 Type 2 diabetes mellitus without complications: Secondary | ICD-10-CM | POA: Insufficient documentation

## 2016-12-08 DIAGNOSIS — R0602 Shortness of breath: Secondary | ICD-10-CM | POA: Diagnosis present

## 2016-12-08 LAB — CBC
HCT: 46.7 % (ref 39.0–52.0)
HEMOGLOBIN: 15.7 g/dL (ref 13.0–17.0)
MCH: 30.7 pg (ref 26.0–34.0)
MCHC: 33.6 g/dL (ref 30.0–36.0)
MCV: 91.2 fL (ref 78.0–100.0)
Platelets: 242 10*3/uL (ref 150–400)
RBC: 5.12 MIL/uL (ref 4.22–5.81)
RDW: 13.8 % (ref 11.5–15.5)
WBC: 8.3 10*3/uL (ref 4.0–10.5)

## 2016-12-08 LAB — BASIC METABOLIC PANEL
ANION GAP: 9 (ref 5–15)
BUN: 18 mg/dL (ref 6–20)
CO2: 24 mmol/L (ref 22–32)
Calcium: 9.3 mg/dL (ref 8.9–10.3)
Chloride: 104 mmol/L (ref 101–111)
Creatinine, Ser: 1.12 mg/dL (ref 0.61–1.24)
Glucose, Bld: 125 mg/dL — ABNORMAL HIGH (ref 65–99)
POTASSIUM: 3.9 mmol/L (ref 3.5–5.1)
Sodium: 137 mmol/L (ref 135–145)

## 2016-12-08 LAB — I-STAT TROPONIN, ED
Troponin i, poc: 0 ng/mL (ref 0.00–0.08)
Troponin i, poc: 0 ng/mL (ref 0.00–0.08)

## 2016-12-08 MED ORDER — METHYLPREDNISOLONE SODIUM SUCC 125 MG IJ SOLR
125.0000 mg | Freq: Once | INTRAMUSCULAR | Status: AC
  Administered 2016-12-08: 125 mg via INTRAVENOUS
  Filled 2016-12-08: qty 2

## 2016-12-08 MED ORDER — KETOROLAC TROMETHAMINE 30 MG/ML IJ SOLN
30.0000 mg | Freq: Once | INTRAMUSCULAR | Status: AC
  Administered 2016-12-08: 30 mg via INTRAVENOUS
  Filled 2016-12-08: qty 1

## 2016-12-08 MED ORDER — IPRATROPIUM-ALBUTEROL 0.5-2.5 (3) MG/3ML IN SOLN
3.0000 mL | Freq: Once | RESPIRATORY_TRACT | Status: AC
  Administered 2016-12-08: 3 mL via RESPIRATORY_TRACT
  Filled 2016-12-08: qty 3

## 2016-12-08 MED ORDER — PREDNISONE 10 MG (21) PO TBPK: ORAL_TABLET | ORAL | 0 refills | Status: DC

## 2016-12-08 NOTE — ED Triage Notes (Addendum)
Pt reports shortness of breath and he states he feels like his heart is running away. He denies having any chest pain. RR e/u. He states he has a hx of COPD, bronchitis and emphysema.

## 2016-12-08 NOTE — ED Provider Notes (Signed)
[facility] DEPT Provider Note   CSN: [id number] Arrival date & time: 12/08/16  1959     History   Chief Complaint Chief Complaint  Patient presents with  . Shortness of Breath    HPI [name] [name] is a 83 y.o. male.  Pt presents to the ED with SOB and palpitations.  He has had this problem intermittently.  The pt was admitted to [facility] from 2/13-14 for cp and ruled out for MI.  He had a stress test that was negative.  He was seen in the ED at [facility] for palpitations on 2/27.  He had a negative work up and was told to f/u with cardiology for a holter monitor.  He said that he did this (but does not know who he had this done through) and it was negative.  He had the palpitations again today.  He also feels sob from his COPD and feels like he needs a neb.  Pt also said he fell and hit his back.      Past Medical History:  Diagnosis Date  . ABSCESS 12/03/2009  . ABSCESS, FINGER 04/07/2010  . ANXIETY 11/03/2009  . ASTHMA 11/03/2009  . CHRONIC OBSTRUCTIVE PULMONARY DISEASE, ACUTE EXACERBATION 11/03/2009  . COPD 11/03/2009  . DEPRESSION 11/03/2009  . DIABETES MELLITUS, TYPE II 11/03/2009  . [place] DISEASE, LUMBAR 11/03/2009  . EMPHYSEMA, BULLOUS 11/03/2009  . GERD 11/03/2009  . HYPERLIPIDEMIA 11/03/2009  . HYPERTENSION 11/03/2009  . Kidney stones 01/30/12   "I've had them 7 times; always have passed them"  . PEPTIC ULCER DISEASE 11/03/2009  . Pneumonia   . RASH-NONVESICULAR 11/03/2009  . RESTLESS LEG SYNDROME 11/03/2009  . Shortness of breath    "sometimes; at any time"  . SPINAL STENOSIS, LUMBAR 11/03/2009    Patient Active Problem List   Diagnosis Date Noted  . Chest pain October 09, 202018  . PNA (pneumonia) 03/17/2012  . Weakness generalized 03/17/2012  . Generalized weakness 01/30/2012  . Fall at home 01/30/2012  . Physical deconditioning 01/30/2012  . Nausea vomiting and diarrhea 01/15/2012  . UTI (urinary tract infection) 01/15/2012  . Cocaine abuse 08/11/2011  . Tobacco  abuse 08/11/2011  . Orthostasis 12/23/2010  . Dehydration 12/23/2010  . Abdominal pain, other specified site 12/23/2010  . Dizziness 12/23/2010  . Weight loss 12/23/2010  . Left lumbar radiculopathy 12/23/2010  . DIABETES MELLITUS, TYPE II 11/03/2009  . HYPERLIPIDEMIA 11/03/2009  . ANXIETY 11/03/2009  . DEPRESSION 11/03/2009  . RESTLESS LEG SYNDROME 11/03/2009  . HYPERTENSION 11/03/2009  . EMPHYSEMA, BULLOUS 11/03/2009  . ASTHMA 11/03/2009  . COPD 11/03/2009  . GERD 11/03/2009  . PEPTIC ULCER DISEASE 11/03/2009  . [place] DISEASE, LUMBAR 11/03/2009  . SPINAL STENOSIS, LUMBAR 11/03/2009  . NEPHROLITHIASIS, HX OF 11/03/2009    Past Surgical History:  Procedure Laterality Date  . [place]   left  . INGUINAL HERNIA REPAIR  10/2011   left  . ROTATOR CUFF REPAIR  2003   left  . TONSILLECTOMY  1960       Home Medications    Prior to Admission medications   Medication Sig Start Date End Date Taking? Authorizing Provider  albuterol (PROVENTIL HFA;VENTOLIN HFA) 108 (90 Base) MCG/ACT inhaler Inhale 1-2 puffs into the lungs every 6 (six) hours as needed for wheezing or shortness of breath.  06/03/13   Historical Provider, MD  aspirin EC 81 MG tablet Take 81 mg by mouth daily.    Historical Provider, MD  lisinopril (PRINIVIL,ZESTRIL) 10  MG tablet Take 10 mg by mouth daily.    Historical Provider, MD  predniSONE (STERAPRED UNI-PAK 21 TAB) 10 MG (21) TBPK tablet Take 6 tabs by mouth daily  for 2 days, then 5 tabs for 2 days, then 4 tabs for 2 days, then 3 tabs for 2 days, 2 tabs for 2 days, then 1 tab by mouth daily for 2 days 12/08/16   Isla Pence, MD    Family History Family History  Problem Relation Age of Onset  . Heart disease Father   . Heart disease Mother   . Cancer Brother     lung    Social History Social History  Substance Use Topics  . Smoking status: Former Smoker    Packs/day: 1.00    Years: 41.00    Types: Cigarettes  . Smokeless tobacco:  Never Used     Comment: "stopped smoking 04/21/1991 then restarted in 2012"  . Alcohol use No     Allergies   Patient has no known allergies.   Review of Systems Review of Systems  Respiratory: Positive for shortness of breath and wheezing.   Cardiovascular: Positive for palpitations.  All other systems reviewed and are negative.    Physical Exam Updated Vital Signs BP (!) 156/92   Pulse 93   Temp 97.8 F (36.6 C) (Oral)   Resp 15   Ht '5\' 11"'$  (1.803 m)   Wt 178 lb (80.7 kg)   SpO2 97%   BMI 24.83 kg/m   Physical Exam  Constitutional: He is oriented to person, place, and time. He appears well-developed and well-nourished.  HENT:  Head: Normocephalic and atraumatic.  Right Ear: External ear normal.  Left Ear: External ear normal.  Nose: Nose normal.  Mouth/Throat: Oropharynx is clear and moist.  Eyes: Conjunctivae and EOM are normal. Pupils are equal, round, and reactive to light.  Neck: Normal range of motion. Neck supple.  Cardiovascular: Normal rate, regular rhythm, normal heart sounds and intact distal pulses.   Pulmonary/Chest: Effort normal. He has wheezes.  Abdominal: Soft. Bowel sounds are normal.  Musculoskeletal: Normal range of motion.  Neurological: He is alert and oriented to person, place, and time.  Skin: Skin is warm.  Psychiatric: He has a normal mood and affect. His behavior is normal. Judgment and thought content normal.  Nursing note and vitals reviewed.    ED Treatments / Results  Labs (all labs ordered are listed, but only abnormal results are displayed) Labs Reviewed  BASIC METABOLIC PANEL - Abnormal; Notable for the following:       Result Value   Glucose, Bld 125 (*)    All other components within normal limits  CBC  I-STAT TROPOININ, ED  I-STAT TROPOININ, ED    EKG  EKG Interpretation  Date/Time:  Thursday December 08 2016 20:11:17 EDT Ventricular Rate:  94 PR Interval:  192 QRS Duration: 96 QT Interval:  374 QTC  Calculation: 467 R Axis:   72 Text Interpretation:  Normal sinus rhythm Normal ECG Confirmed by Tauren Delbuono MD, Nneoma Harral ([id number]) on 12/08/2016 9:59:22 PM       Radiology Dg Chest 2 View  Result Date: 12/08/2016 CLINICAL DATA:  83 year old male with shortness of breath. EXAM: CHEST  2 VIEW COMPARISON:  Chest radiograph dated 11/15/2016 FINDINGS: There is emphysematous changes of the with areas of bullous formation in the left upper lobe and chronic interstitial coarsening and scarring of the lung bases. The appearance of the lungs is similar to prior radiograph. There is  no focal consolidation, pleural effusion, or pneumothorax. Top-normal cardiac silhouette. No acute osseous pathology. IMPRESSION: 1. No acute cardiopulmonary process. 2. Emphysema with bibasilar linear atelectasis/scarring and bullous changes of the left upper lobe. Electronically Signed   By: Anner Crete M.D.   On: 12/08/2016 21:01   Dg Lumbar Spine Complete  Result Date: 12/08/2016 CLINICAL DATA:  Back pain EXAM: LUMBAR SPINE - COMPLETE 4+ VIEW COMPARISON:  Lumbar spine MRI 04/26/2011 FINDINGS: There are 5 non-rib-bearing vertebral bodies. At L5-S1, there is a right-sided assimilation joint/pseudoarthrosis. There is no fracture or listhesis of the lumbar spine. There is multilevel disc space narrowing with associated osteophyte formation and facet arthrosis. IMPRESSION: No acute abnormality of the lumbar spine. Multilevel mild-to-moderate degenerative disc disease and facet arthrosis Electronically Signed   By: Ulyses Jarred M.D.   On: 12/08/2016 23:06    Procedures Procedures (including critical care time)  Medications Ordered in ED Medications  ipratropium-albuterol (DUONEB) 0.5-2.5 (3) MG/3ML nebulizer solution 3 mL (3 mLs Nebulization Given 12/08/16 2251)  methylPREDNISolone sodium succinate (SOLU-MEDROL) 125 mg/2 mL injection 125 mg (125 mg Intravenous Given 12/08/16 2342)  ketorolac (TORADOL) 30 MG/ML injection 30 mg (30  mg Intravenous Given 12/08/16 2342)     Initial Impression / Assessment and Plan / ED Course  I have reviewed the triage vital signs and the nursing notes.  Pertinent labs & imaging results that were available during my care of the patient were reviewed by me and considered in my medical decision making (see chart for details).    Pt lives in [place] and is planning to go back in about 2 weeks.  He is encouraged to f/u with his pcp when he gets back.  He is given the number for cardiology here if he decides to stay.  He knows to return if worse.  Final Clinical Impressions(s) / ED Diagnoses   Final diagnoses:  Palpitations  COPD exacerbation (HCC)    New Prescriptions New Prescriptions   PREDNISONE (STERAPRED UNI-PAK 21 TAB) 10 MG (21) TBPK TABLET    Take 6 tabs by mouth daily  for 2 days, then 5 tabs for 2 days, then 4 tabs for 2 days, then 3 tabs for 2 days, 2 tabs for 2 days, then 1 tab by mouth daily for 2 days     Isla Pence, MD 12/08/16 2348

## 2016-12-08 NOTE — Discharge Instructions (Signed)
You need to establish yourself with a pcp.

## 2016-12-09 NOTE — ED Notes (Signed)
Pt verbalized understanding discharge instructions and denies any further needs or questions at this time. VS stable, ambulatory and steady gait.   

## 2016-12-15 ENCOUNTER — Inpatient Hospital Stay
Admission: EM | Admit: 2016-12-15 | Discharge: 2016-12-16 | DRG: 149 | Disposition: A | Discharge status: Home / Self Care | Payer: Medicare (Managed Care) | Attending: Internal Medicine | Admitting: Internal Medicine

## 2016-12-15 ENCOUNTER — Emergency Department: Payer: Medicare (Managed Care)

## 2016-12-15 ENCOUNTER — Encounter: Payer: Self-pay | Admitting: Emergency Medicine

## 2016-12-15 DIAGNOSIS — Z7951 Long term (current) use of inhaled steroids: Secondary | ICD-10-CM | POA: Diagnosis not present

## 2016-12-15 DIAGNOSIS — R42 Dizziness and giddiness: Principal | ICD-10-CM | POA: Diagnosis present

## 2016-12-15 DIAGNOSIS — R29700 NIHSS score 0: Secondary | ICD-10-CM | POA: Diagnosis present

## 2016-12-15 DIAGNOSIS — E785 Hyperlipidemia, unspecified: Secondary | ICD-10-CM | POA: Diagnosis present

## 2016-12-15 DIAGNOSIS — I6350 Cerebral infarction due to unspecified occlusion or stenosis of unspecified cerebral artery: Secondary | ICD-10-CM | POA: Diagnosis not present

## 2016-12-15 DIAGNOSIS — F17210 Nicotine dependence, cigarettes, uncomplicated: Secondary | ICD-10-CM | POA: Diagnosis present

## 2016-12-15 DIAGNOSIS — Z8711 Personal history of peptic ulcer disease: Secondary | ICD-10-CM | POA: Diagnosis not present

## 2016-12-15 DIAGNOSIS — Z79899 Other long term (current) drug therapy: Secondary | ICD-10-CM | POA: Diagnosis not present

## 2016-12-15 DIAGNOSIS — J449 Chronic obstructive pulmonary disease, unspecified: Secondary | ICD-10-CM | POA: Diagnosis present

## 2016-12-15 DIAGNOSIS — Z66 Do not resuscitate: Secondary | ICD-10-CM | POA: Diagnosis present

## 2016-12-15 DIAGNOSIS — M5136 Other intervertebral disc degeneration, lumbar region: Secondary | ICD-10-CM | POA: Diagnosis present

## 2016-12-15 DIAGNOSIS — R262 Difficulty in walking, not elsewhere classified: Secondary | ICD-10-CM | POA: Diagnosis present

## 2016-12-15 DIAGNOSIS — M48061 Spinal stenosis, lumbar region without neurogenic claudication: Secondary | ICD-10-CM | POA: Diagnosis present

## 2016-12-15 DIAGNOSIS — Z7982 Long term (current) use of aspirin: Secondary | ICD-10-CM | POA: Diagnosis not present

## 2016-12-15 DIAGNOSIS — H538 Other visual disturbances: Secondary | ICD-10-CM | POA: Diagnosis present

## 2016-12-15 DIAGNOSIS — I10 Essential (primary) hypertension: Secondary | ICD-10-CM | POA: Diagnosis present

## 2016-12-15 DIAGNOSIS — E119 Type 2 diabetes mellitus without complications: Secondary | ICD-10-CM | POA: Diagnosis present

## 2016-12-15 DIAGNOSIS — I639 Cerebral infarction, unspecified: Secondary | ICD-10-CM | POA: Diagnosis present

## 2016-12-15 LAB — CBC
HCT: 44.7 % (ref 40.0–52.0)
Hemoglobin: 15 g/dL (ref 13.0–18.0)
MCH: 30.7 pg (ref 26.0–34.0)
MCHC: 33.6 g/dL (ref 32.0–36.0)
MCV: 91.2 fL (ref 80.0–100.0)
Platelets: 237 10*3/uL (ref 150–440)
RBC: 4.9 MIL/uL (ref 4.40–5.90)
RDW: 14.4 % (ref 11.5–14.5)
WBC: 12 10*3/uL — ABNORMAL HIGH (ref 3.8–10.6)

## 2016-12-15 LAB — GLUCOSE, CAPILLARY
Glucose-Capillary: 89 mg/dL (ref 65–99)
Glucose-Capillary: 112 mg/dL — ABNORMAL HIGH (ref 65–99)
Glucose-Capillary: 151 mg/dL — ABNORMAL HIGH (ref 65–99)

## 2016-12-15 LAB — URINE DRUG SCREEN, QUALITATIVE (ARMC ONLY)
Amphetamines, Ur Screen: NOT DETECTED
BARBITURATES, UR SCREEN: POSITIVE — AB
Benzodiazepine, Ur Scrn: NOT DETECTED
CANNABINOID 50 NG, UR ~~LOC~~: NOT DETECTED
COCAINE METABOLITE, UR ~~LOC~~: NOT DETECTED
MDMA (ECSTASY) UR SCREEN: NOT DETECTED
Methadone Scn, Ur: NOT DETECTED
OPIATE, UR SCREEN: NOT DETECTED
PHENCYCLIDINE (PCP) UR S: NOT DETECTED
TRICYCLIC, UR SCREEN: NOT DETECTED

## 2016-12-15 LAB — URINALYSIS, ROUTINE W REFLEX MICROSCOPIC
Bilirubin Urine: NEGATIVE
Glucose, UA: NEGATIVE mg/dL
Hgb urine dipstick: NEGATIVE
Ketones, ur: NEGATIVE mg/dL
LEUKOCYTES UA: NEGATIVE
Nitrite: NEGATIVE
PH: 7 (ref 5.0–8.0)
Protein, ur: NEGATIVE mg/dL

## 2016-12-15 LAB — BASIC METABOLIC PANEL
Anion gap: 6 (ref 5–15)
BUN: 23 mg/dL — ABNORMAL HIGH (ref 6–20)
CO2: 27 mmol/L (ref 22–32)
Calcium: 8.8 mg/dL — ABNORMAL LOW (ref 8.9–10.3)
Chloride: 104 mmol/L (ref 101–111)
Creatinine, Ser: 1.02 mg/dL (ref 0.61–1.24)
GFR calc Af Amer: >60 mL/min (ref >60)
GFR calc non Af Amer: >60 mL/min (ref >60)
Glucose, Bld: 108 mg/dL — ABNORMAL HIGH (ref 65–99)
Potassium: 3.9 mmol/L (ref 3.5–5.1)
Sodium: 137 mmol/L (ref 135–145)

## 2016-12-15 LAB — APTT: aPTT: 28 seconds (ref 24–36)

## 2016-12-15 LAB — DIFFERENTIAL
Basophils Absolute: 0.1 10*3/uL (ref 0–0.1)
Basophils Relative: 1 %
EOS ABS: 0.2 10*3/uL (ref 0–0.7)
Eosinophils Relative: 2 %
LYMPHS ABS: 1.5 10*3/uL (ref 1.0–3.6)
Lymphocytes Relative: 13 %
MONO ABS: 0.6 10*3/uL (ref 0.2–1.0)
MONOS PCT: 5 %
Neutro Abs: 8.9 10*3/uL — ABNORMAL HIGH (ref 1.4–6.5)
Neutrophils Relative %: 79 %

## 2016-12-15 LAB — HEPATIC FUNCTION PANEL
ALBUMIN: 3.6 g/dL (ref 3.5–5.0)
ALT: 19 U/L (ref 17–63)
AST: 19 U/L (ref 15–41)
Alkaline Phosphatase: 61 U/L (ref 38–126)
BILIRUBIN DIRECT: 0.2 mg/dL (ref 0.1–0.5)
BILIRUBIN TOTAL: 0.9 mg/dL (ref 0.3–1.2)
Indirect Bilirubin: 0.7 mg/dL (ref 0.3–0.9)
Total Protein: 6.5 g/dL (ref 6.5–8.1)

## 2016-12-15 LAB — TROPONIN I: Troponin I: <0.03 ng/mL (ref <0.03)

## 2016-12-15 LAB — PROTIME-INR
INR: 0.93
Prothrombin Time: 12.5 seconds (ref 11.4–15.2)

## 2016-12-15 LAB — ETHANOL: Alcohol, Ethyl (B): <5 mg/dL (ref <5)

## 2016-12-15 MED ORDER — ASPIRIN EC 81 MG PO TBEC
81.0000 mg | DELAYED_RELEASE_TABLET | Freq: Every day | ORAL | Status: DC
  Administered 2016-12-16 (×2): 81 mg via ORAL
  Filled 2016-12-15: qty 1

## 2016-12-15 MED ORDER — INSULIN ASPART 100 UNIT/ML ~~LOC~~ SOLN: 0.0000 [IU] | Freq: Three times a day (TID) | SUBCUTANEOUS | Status: DC

## 2016-12-15 MED ORDER — MOMETASONE FURO-FORMOTEROL FUM 100-5 MCG/ACT IN AERO
2.0000 | INHALATION_SPRAY | Freq: Two times a day (BID) | RESPIRATORY_TRACT | Status: DC
  Administered 2016-12-15 – 2016-12-16 (×2): 2 via RESPIRATORY_TRACT
  Filled 2016-12-15: qty 8.8

## 2016-12-15 MED ORDER — ACETAMINOPHEN 160 MG/5ML PO SOLN: 650.0000 mg | ORAL | Status: DC | PRN

## 2016-12-15 MED ORDER — ACETAMINOPHEN 325 MG PO TABS: 650.0000 mg | ORAL_TABLET | ORAL | Status: DC | PRN

## 2016-12-15 MED ORDER — ACETAMINOPHEN 650 MG RE SUPP
650.0000 mg | RECTAL | Status: DC | PRN
Start: 2016-12-15 — End: 2016-12-16

## 2016-12-15 MED ORDER — ALBUTEROL SULFATE (2.5 MG/3ML) 0.083% IN NEBU: 2.5000 mg | INHALATION_SOLUTION | Freq: Four times a day (QID) | RESPIRATORY_TRACT | Status: DC | PRN

## 2016-12-15 MED ORDER — STROKE: EARLY STAGES OF RECOVERY BOOK
Freq: Once | Status: AC
  Administered 2016-12-15: 21:00:00

## 2016-12-15 MED ORDER — ENOXAPARIN SODIUM 40 MG/0.4ML ~~LOC~~ SOLN
40.0000 mg | SUBCUTANEOUS | Status: DC
  Administered 2016-12-15: 40 mg via SUBCUTANEOUS
  Filled 2016-12-15: qty 0.4

## 2016-12-15 MED ORDER — ASPIRIN 81 MG PO CHEW: 324.0000 mg | CHEWABLE_TABLET | Freq: Once | ORAL | Status: DC

## 2016-12-15 MED ORDER — IOPAMIDOL (ISOVUE-370) INJECTION 76%
80.0000 mL | Freq: Once | INTRAVENOUS | Status: AC | PRN
  Administered 2016-12-15: 80 mL via INTRAVENOUS

## 2016-12-15 MED ORDER — LISINOPRIL 10 MG PO TABS
10.0000 mg | ORAL_TABLET | Freq: Every day | ORAL | Status: DC
  Administered 2016-12-15 – 2016-12-16 (×2): 10 mg via ORAL
  Filled 2016-12-15 (×2): qty 1

## 2016-12-15 MED ORDER — ASPIRIN 81 MG PO CHEW
324.0000 mg | CHEWABLE_TABLET | Freq: Once | ORAL | Status: AC
  Administered 2016-12-15: 324 mg via ORAL
  Filled 2016-12-15: qty 4

## 2016-12-15 NOTE — ED Notes (Signed)
Pt given sandwich tray and milk. Ok per dr Wilnette Kales.

## 2016-12-15 NOTE — ED Notes (Signed)
Dr Kerman Passey notified of pt

## 2016-12-15 NOTE — ED Provider Notes (Signed)
[facility] Emergency Department Provider Note  Time seen: 4:09 PM  I have reviewed the triage vital signs and the nursing notes.   HISTORY  Chief Complaint Dizziness    HPI [name] [name] is a 83 y.o. male with a past medical history of COPD, depression, diabetes, hypertension, hyperlipidemia, presents to the emergency department with acute onset of blurred vision, dizziness, difficulty walking. According to the patient he has been staying in [place] he was driving back to [place] today when 2:30 PM he had acute onset of dizziness, felt very lightheaded. Patient states he pulled over at a rest stop. Attempted to walk inside and felt "drunk." States he was having a very difficult time standing upright when walking. Became nauseated with one episode of vomiting. States he felt generalized weakness but denies any focal weakness or numbness. Denies any slurred speech or confusion. Denies any chest pain. He states when he was very dizzy he did feel somewhat short of breath.  Past Medical History:  Diagnosis Date  . ABSCESS 12/03/2009  . ABSCESS, FINGER 04/07/2010  . ANXIETY 11/03/2009  . ASTHMA 11/03/2009  . CHRONIC OBSTRUCTIVE PULMONARY DISEASE, ACUTE EXACERBATION 11/03/2009  . COPD 11/03/2009  . DEPRESSION 11/03/2009  . DIABETES MELLITUS, TYPE II 11/03/2009  . [place] DISEASE, LUMBAR 11/03/2009  . EMPHYSEMA, BULLOUS 11/03/2009  . GERD 11/03/2009  . HYPERLIPIDEMIA 11/03/2009  . HYPERTENSION 11/03/2009  . Kidney stones 01/30/12   "I've had them 7 times; always have passed them"  . PEPTIC ULCER DISEASE 11/03/2009  . Pneumonia   . RASH-NONVESICULAR 11/03/2009  . RESTLESS LEG SYNDROME 11/03/2009  . Shortness of breath    "sometimes; at any time"  . SPINAL STENOSIS, LUMBAR 11/03/2009    Patient Active Problem List   Diagnosis Date Noted  . Chest pain 03-19-2017  . PNA (pneumonia) 03/17/2012  . Weakness generalized 03/17/2012  . Generalized weakness 01/30/2012  . Fall at home  01/30/2012  . Physical deconditioning 01/30/2012  . Nausea vomiting and diarrhea 01/15/2012  . UTI (urinary tract infection) 01/15/2012  . Cocaine abuse 08/11/2011  . Tobacco abuse 08/11/2011  . Orthostasis 12/23/2010  . Dehydration 12/23/2010  . Abdominal pain, other specified site 12/23/2010  . Dizziness 12/23/2010  . Weight loss 12/23/2010  . Left lumbar radiculopathy 12/23/2010  . DIABETES MELLITUS, TYPE II 11/03/2009  . HYPERLIPIDEMIA 11/03/2009  . ANXIETY 11/03/2009  . DEPRESSION 11/03/2009  . RESTLESS LEG SYNDROME 11/03/2009  . HYPERTENSION 11/03/2009  . EMPHYSEMA, BULLOUS 11/03/2009  . ASTHMA 11/03/2009  . COPD 11/03/2009  . GERD 11/03/2009  . PEPTIC ULCER DISEASE 11/03/2009  . [place] DISEASE, LUMBAR 11/03/2009  . SPINAL STENOSIS, LUMBAR 11/03/2009  . NEPHROLITHIASIS, HX OF 11/03/2009    Past Surgical History:  Procedure Laterality Date  . [place]   left  . INGUINAL HERNIA REPAIR  10/2011   left  . ROTATOR CUFF REPAIR  2003   left  . TONSILLECTOMY  1960    Prior to Admission medications   Medication Sig Start Date End Date Taking? Authorizing Provider  albuterol (PROVENTIL HFA;VENTOLIN HFA) 108 (90 Base) MCG/ACT inhaler Inhale 1-2 puffs into the lungs every 6 (six) hours as needed for wheezing or shortness of breath.  06/03/13   Historical Provider, MD  aspirin EC 81 MG tablet Take 81 mg by mouth daily.    Historical Provider, MD  lisinopril (PRINIVIL,ZESTRIL) 10 MG tablet Take 10 mg by mouth daily.    Historical Provider, MD  predniSONE [facility]  UNI-PAK 21 TAB) 10 MG (21) TBPK tablet Take 6 tabs by mouth daily  for 2 days, then 5 tabs for 2 days, then 4 tabs for 2 days, then 3 tabs for 2 days, 2 tabs for 2 days, then 1 tab by mouth daily for 2 days 12/08/16   Isla Pence, MD    No Known Allergies  Family History  Problem Relation Age of Onset  . Heart disease Father   . Heart disease Mother   . Cancer Brother     lung    Social  History Social History  Substance Use Topics  . Smoking status: Current Every Day Smoker    Packs/day: 1.00    Years: 41.00    Types: Cigarettes  . Smokeless tobacco: Never Used     Comment: "stopped smoking 04/21/1991 then restarted in 2012"  . Alcohol use No    Review of Systems Constitutional: Negative for fever.Feeling off balance and dizzy Cardiovascular: Negative for chest pain. Respiratory: Mild shortness breath, now resolved Gastrointestinal: Negative for abdominal pain Neurological: Negative for headache. States generalized weakness but denies any focal weakness or numbness. States ataxic and dizzy with blurred vision. 10-point ROS otherwise negative.  ____________________________________________   PHYSICAL EXAM:  VITAL SIGNS: ED Triage Vitals [12/15/16 1551]  Enc Vitals Group     BP 139/75     Pulse Rate 100     Resp 19     Temp 97.9 F (36.6 C)     Temp Source Oral     SpO2 96 %     Weight 178 lb (80.7 kg)     Height '5\' 11"'$  (1.803 m)     Head Circumference      Peak Flow      Pain Score      Pain Loc      Pain Edu?      Excl. in [place]?     Constitutional: Alert and oriented. Well appearing and in no distress. Eyes: Normal exam ENT   Head: Normocephalic and atraumatic   Mouth/Throat: Mucous membranes are moist. Cardiovascular: Normal rate, regular rhythm. No murmur Respiratory: Normal respiratory effort without tachypnea nor retractions. Breath sounds are clear  Gastrointestinal: Soft and nontender. No distention. Musculoskeletal: Nontender with normal range of motion in all extremities. Neurologic:  Normal speech and language. Slight left facial droop which the patient states is new. Equal grip strengths, no pronator drift, 5/5 motor in all extremities. Sensation is intact and equal in all extremities and face.  Skin:  Skin is warm, dry and intact.  Psychiatric: Mood and affect are normal.  ____________________________________________     EKG  EKG reviewed and interpreted by myself shows normal sinus rhythm at 99 bpm, narrow QRS, normal axis, normal intervals, nonspecific ST changes without ST elevation.  ____________________________________________    RADIOLOGY  CT scan is negative CT angiography is negative.  ____________________________________________   INITIAL IMPRESSION / ASSESSMENT AND PLAN / ED COURSE  Pertinent labs & imaging results that were available during my care of the patient were reviewed by me and considered in my medical decision making (see chart for details).  The patient presents to the emergency department with acute onset of dizziness, was ataxic with difficulty walking, blurred vision and felt very lightheaded. On exam the patient has a largely intact neurological exam besides a very slight left-sided facial droop. Patient believes this is new although he did not notice this himself. We will check labs, CT head, EKG. Code stroke has been  activated. Symptoms onset at 2:30 PM. Patient does not take any blood thinners besides daily aspirin although states he did not take it today.  Patient has been seen by the telemetry neurologist. He believes his symptoms has resolved sides mild ataxia but will give the patient in NIH stroke scale of 0. He is not sure if this is TIA or unrelated to the central nervous system. He recommends obtaining a stat CT angiography of the head and neck to rule out vessel occlusion however states if this is normal he believes the patient should be admitted to the hospital for CVA workup.  CT angiography is negative. Patient continues to state he feels dizzy and somewhat blurred vision. We will admit to the hospital for further treatment. Aspirin has been given. ____________________________________________   FINAL CLINICAL IMPRESSION(S) / ED DIAGNOSES  Ataxia Dizziness    Harvest Dark, MD 12/15/16 1806

## 2016-12-15 NOTE — H&P (Signed)
[facility] [place] NAME: [name] [name]    MR#:  [id number]  DATE OF BIRTH:  June 15, 1934  DATE OF ADMISSION:  12/15/2016  PRIMARY CARE PHYSICIAN: Pcp Not In System   REQUESTING/REFERRING PHYSICIAN:  Paduchowski MD  CHIEF COMPLAINT:   Chief Complaint  Patient presents with  . Dizziness    HISTORY OF PRESENT ILLNESS: [name] [name]  is a 83 y.o. male with a known history of Anxiety, asthma, COPD, depression, diabetes type 2, GERD, hyper-tension and hyperlipidemia will states that earlier he was driving when all of a sudden he started having blurred vision, dizziness and difficulty walking. He states that he felt very exhausted he was treated with a try to walk. The patient does not have any focal weakness no numbness or tingling. He continues to have some blurred vision and dizziness. He denies any previous history of CVA or  tia.   PAST MEDICAL HISTORY:   Past Medical History:  Diagnosis Date  . ABSCESS 12/03/2009  . ABSCESS, FINGER 04/07/2010  . ANXIETY 11/03/2009  . ASTHMA 11/03/2009  . CHRONIC OBSTRUCTIVE PULMONARY DISEASE, ACUTE EXACERBATION 11/03/2009  . COPD 11/03/2009  . DEPRESSION 11/03/2009  . DIABETES MELLITUS, TYPE II 11/03/2009  . [place] DISEASE, LUMBAR 11/03/2009  . EMPHYSEMA, BULLOUS 11/03/2009  . GERD 11/03/2009  . HYPERLIPIDEMIA 11/03/2009  . HYPERTENSION 11/03/2009  . Kidney stones 01/30/12   "I've had them 7 times; always have passed them"  . PEPTIC ULCER DISEASE 11/03/2009  . Pneumonia   . RASH-NONVESICULAR 11/03/2009  . RESTLESS LEG SYNDROME 11/03/2009  . Shortness of breath    "sometimes; at any time"  . SPINAL STENOSIS, LUMBAR 11/03/2009    PAST SURGICAL HISTORY: Past Surgical History:  Procedure Laterality Date  . [place]   left  . INGUINAL HERNIA REPAIR  10/2011   left  . ROTATOR CUFF REPAIR  2003   left  . TONSILLECTOMY  1960    SOCIAL HISTORY:  Social History  Substance Use Topics  . Smoking status:  Current Every Day Smoker    Packs/day: 1.00    Years: 41.00    Types: Cigarettes  . Smokeless tobacco: Never Used     Comment: "stopped smoking 04/21/1991 then restarted in 2012"  . Alcohol use No    FAMILY HISTORY:  Family History  Problem Relation Age of Onset  . Heart disease Father   . Heart disease Mother   . Cancer Brother     lung    DRUG ALLERGIES: No Known Allergies  REVIEW OF SYSTEMS:   CONSTITUTIONAL: No fever, fatigue or weakness.  EYES: No blurred or double vision.  EARS, NOSE, AND THROAT: No tinnitus or ear pain.  RESPIRATORY: No cough, shortness of breath, wheezing or hemoptysis.  CARDIOVASCULAR: No chest pain, orthopnea, edema.  GASTROINTESTINAL: No nausea, vomiting, diarrhea or abdominal pain.  GENITOURINARY: No dysuria, hematuria.  ENDOCRINE: No polyuria, nocturia,  HEMATOLOGY: No anemia, easy bruising or bleeding SKIN: No rash or lesion. MUSCULOSKELETAL: No joint pain or arthritis.   NEUROLOGIC: Complains of dizziness and unsteady gait PSYCHIATRY: No anxiety or depression.   MEDICATIONS AT HOME:  Prior to Admission medications   Medication Sig Start Date End Date Taking? Authorizing Provider  albuterol (PROVENTIL HFA;VENTOLIN HFA) 108 (90 Base) MCG/ACT inhaler Inhale 1-2 puffs into the lungs every 6 (six) hours as needed for wheezing or shortness of breath.  06/03/13  Yes Historical Provider, MD  aspirin EC 81 MG tablet  Take 81 mg by mouth daily.   Yes Historical Provider, MD  Fluticasone-Salmeterol (ADVAIR) 100-50 MCG/DOSE AEPB Inhale 1 puff into the lungs 2 (two) times daily.   Yes Historical Provider, MD  lisinopril (PRINIVIL,ZESTRIL) 10 MG tablet Take 10 mg by mouth daily.   Yes Historical Provider, MD  predniSONE (STERAPRED UNI-PAK 21 TAB) 10 MG (21) TBPK tablet Take 6 tabs by mouth daily  for 2 days, then 5 tabs for 2 days, then 4 tabs for 2 days, then 3 tabs for 2 days, 2 tabs for 2 days, then 1 tab by mouth daily for 2 days 12/08/16  Yes Isla Pence, MD      PHYSICAL EXAMINATION:   VITAL SIGNS: Blood pressure (!) 160/95, pulse 90, temperature 97.9 F (36.6 C), temperature source Oral, resp. rate 16, height '5\' 11"'$  (1.803 m), weight 178 lb (80.7 kg), SpO2 96 %.  GENERAL:  83 y.o.-year-old patient lying in the bed with no acute distress.  EYES: Pupils equal, round, reactive to light and accommodation. No scleral icterus. Extraocular muscles intact.  HEENT: Head atraumatic, normocephalic. Oropharynx and nasopharynx clear.  NECK:  Supple, no jugular venous distention. No thyroid enlargement, no tenderness.  LUNGS: Normal breath sounds bilaterally, no wheezing, rales,rhonchi or crepitation. No use of accessory muscles of respiration.  CARDIOVASCULAR: S1, S2 normal. No murmurs, rubs, or gallops.  ABDOMEN: Soft, nontender, nondistended. Bowel sounds present. No organomegaly or mass.  EXTREMITIES: No pedal edema, cyanosis, or clubbing.  NEUROLOGIC: Cranial nerves II through XII are intact. Muscle strength 5/5 in all extremities. Sensation intact. Gait not checked.  PSYCHIATRIC: The patient is alert and oriented x 3.  SKIN: No obvious rash, lesion, or ulcer.   LABORATORY PANEL:   CBC  Recent Labs Lab 12/08/16 2009 12/15/16 1554  WBC 8.3 12.0*  HGB 15.7 15.0  HCT 46.7 44.7  PLT 242 237  MCV 91.2 91.2  MCH 30.7 30.7  MCHC 33.6 33.6  RDW 13.8 14.4  LYMPHSABS  --  1.5  MONOABS  --  0.6  EOSABS  --  0.2  BASOSABS  --  0.1   ------------------------------------------------------------------------------------------------------------------  Chemistries   Recent Labs Lab 12/08/16 2009 12/15/16 1554  NA 137 137  K 3.9 3.9  CL 104 104  CO2 24 27  GLUCOSE 125* 108*  BUN 18 23*  CREATININE 1.12 1.02  CALCIUM 9.3 8.8*  AST  --  19  ALT  --  19  ALKPHOS  --  61  BILITOT  --  0.9   ------------------------------------------------------------------------------------------------------------------ estimated creatinine  clearance is 61.5 mL/min (by C-G formula based on SCr of 1.02 mg/dL). ------------------------------------------------------------------------------------------------------------------ No results for input(s): TSH, T4TOTAL, T3FREE, THYROIDAB in the last 72 hours.  Invalid input(s): FREET3   Coagulation profile  Recent Labs Lab 12/15/16 1611  INR 0.93   ------------------------------------------------------------------------------------------------------------------- No results for input(s): DDIMER in the last 72 hours. -------------------------------------------------------------------------------------------------------------------  Cardiac Enzymes  Recent Labs Lab 12/15/16 1554  TROPONINI <0.03   ------------------------------------------------------------------------------------------------------------------ Invalid input(s): POCBNP  ---------------------------------------------------------------------------------------------------------------  Urinalysis    Component Value Date/Time   COLORURINE Yellow 05/15/2012 1605   COLORURINE YELLOW 03/17/2012 0216   APPEARANCEUR Hazy 05/15/2012 1605   LABSPEC 1.020 05/15/2012 1605   PHURINE 6.0 05/15/2012 1605   PHURINE 6.0 03/17/2012 0216   GLUCOSEU Negative 05/15/2012 1605   GLUCOSEU >=1000 11/03/2009 1019   HGBUR Negative 05/15/2012 1605   HGBUR NEGATIVE 03/17/2012 0216   BILIRUBINUR Negative 05/15/2012 1605   KETONESUR Negative 05/15/2012 [place]  03/17/2012 0216   PROTEINUR Negative 05/15/2012 1605   PROTEINUR NEGATIVE 03/17/2012 0216   UROBILINOGEN 0.2 03/17/2012 0216   NITRITE Negative 05/15/2012 1605   NITRITE NEGATIVE 03/17/2012 0216   LEUKOCYTESUR Negative 05/15/2012 1605     RADIOLOGY: Ct Angio Head W Or Wo Contrast  Result Date: 12/15/2016 CLINICAL DATA:  Lightheaded 1.5 hours prior to arrival. Code stroke from earlier. EXAM: CT ANGIOGRAPHY HEAD AND NECK TECHNIQUE: Multidetector CT  imaging of the head and neck was performed using the standard protocol during bolus administration of intravenous contrast. Multiplanar CT image reconstructions and MIPs were obtained to evaluate the vascular anatomy. Carotid stenosis measurements (when applicable) are obtained utilizing NASCET criteria, using the distal internal carotid diameter as the denominator. CONTRAST:  Isovue 370, 80 mL. COMPARISON:  CT head earlier today. Multiple prior cross-sectional imaging studies including previous MR from 05/16/2012 FINDINGS: CTA NECK Aortic arch: Standard branching. Imaged portion shows no evidence of aneurysm or dissection. No significant stenosis of the major arch vessel origins. Mild atheromatous change transverse arch. Right carotid system: Mild atheromatous change at the bifurcation. No evidence of dissection, stenosis (50% or greater) or occlusion. Left carotid system: Mild atheromatous change at the bifurcation. No evidence of dissection, stenosis (50% or greater) or occlusion. Vertebral arteries: Codominant. No evidence of dissection, stenosis (50% or greater) or occlusion. Mild calcific plaque adjacent to the LEFT vertebral origin, but no narrowing. Nonvascular soft tissues: Severe COPD with biapical bullous change, compressive atelectasis in the RIGHT upper lobe. No definite nodule or adenopathy. Spondylosis. No neck masses. CTA HEAD Anterior circulation: The internal carotid arteries are widely patent. There is no significant disease involving the anterior or middle cerebral arteries. No significant stenosis, proximal occlusion, aneurysm, or vascular malformation. Posterior circulation: Both vertebral arteries contribute to basilar formation. No significant stenosis, proximal occlusion, aneurysm, or vascular malformation. Venous sinuses: As permitted by contrast timing, patent. Anatomic variants: None of significance. Delayed phase:  Not performed. Review of the MIP images confirms the above findings  IMPRESSION: Unremarkable CTA extracranial and intracranial circulation. No flow-limiting stenosis, dissection, significant ulceration, or significant intracranial disease. Electronically Signed   By: Staci Righter M.D.   On: 12/15/2016 17:51   Ct Angio Neck W And/or Wo Contrast  Result Date: 12/15/2016 CLINICAL DATA:  Lightheaded 1.5 hours prior to arrival. Code stroke from earlier. EXAM: CT ANGIOGRAPHY HEAD AND NECK TECHNIQUE: Multidetector CT imaging of the head and neck was performed using the standard protocol during bolus administration of intravenous contrast. Multiplanar CT image reconstructions and MIPs were obtained to evaluate the vascular anatomy. Carotid stenosis measurements (when applicable) are obtained utilizing NASCET criteria, using the distal internal carotid diameter as the denominator. CONTRAST:  Isovue 370, 80 mL. COMPARISON:  CT head earlier today. Multiple prior cross-sectional imaging studies including previous MR from 05/16/2012 FINDINGS: CTA NECK Aortic arch: Standard branching. Imaged portion shows no evidence of aneurysm or dissection. No significant stenosis of the major arch vessel origins. Mild atheromatous change transverse arch. Right carotid system: Mild atheromatous change at the bifurcation. No evidence of dissection, stenosis (50% or greater) or occlusion. Left carotid system: Mild atheromatous change at the bifurcation. No evidence of dissection, stenosis (50% or greater) or occlusion. Vertebral arteries: Codominant. No evidence of dissection, stenosis (50% or greater) or occlusion. Mild calcific plaque adjacent to the LEFT vertebral origin, but no narrowing. Nonvascular soft tissues: Severe COPD with biapical bullous change, compressive atelectasis in the RIGHT upper lobe. No definite nodule or adenopathy. Spondylosis. No neck  masses. CTA HEAD Anterior circulation: The internal carotid arteries are widely patent. There is no significant disease involving the anterior or  middle cerebral arteries. No significant stenosis, proximal occlusion, aneurysm, or vascular malformation. Posterior circulation: Both vertebral arteries contribute to basilar formation. No significant stenosis, proximal occlusion, aneurysm, or vascular malformation. Venous sinuses: As permitted by contrast timing, patent. Anatomic variants: None of significance. Delayed phase:  Not performed. Review of the MIP images confirms the above findings IMPRESSION: Unremarkable CTA extracranial and intracranial circulation. No flow-limiting stenosis, dissection, significant ulceration, or significant intracranial disease. Electronically Signed   By: Staci Righter M.D.   On: 12/15/2016 17:51   Ct Head Code Stroke W/o Cm  Result Date: 12/15/2016 CLINICAL DATA:  Code stroke.  Ataxia left face droop since 2:30. EXAM: CT HEAD WITHOUT CONTRAST TECHNIQUE: Contiguous axial images were obtained from the base of the skull through the vertex without intravenous contrast. COMPARISON:  05/24/2012 FINDINGS: Brain: No evidence of acute infarction, hemorrhage, hydrocephalus, extra-axial collection or mass lesion/mass effect. Normal appearance for age. Vascular: No hyperdense vessel or unexpected calcification. Skull: Unremarkable Sinuses/Orbits: No acute finding. Other: These results were called by telephone at the time of interpretation on 12/15/2016 at 4:26 pm to Dr. Harvest Dark , who verbally acknowledged these results. ASPECTS [facility] Stroke Program Early CT Score) - Ganglionic level infarction (caudate, lentiform nuclei, internal capsule, insula, M1-M3 cortex): 7 - Supraganglionic infarction (M4-M6 cortex): 3 Total score (0-10 with 10 being normal): 10 IMPRESSION: No acute finding. ASPECTS is 10. Electronically Signed   By: Monte Fantasia M.D.   On: 12/15/2016 16:29    EKG: Orders placed or performed during the hospital encounter of 12/15/16  . ED EKG  . ED EKG  . EKG 12-Lead  . EKG 12-Lead    IMPRESSION AND  PLAN: Patient is a 83 year old white male presenting with complaint of dizziness and unsteady gait  1. Acute CVA Patient underwent CTA of his head and brain which were not very impressive I will admit him and obtain MRI of the brain Echocardiogram of the heart Neurology evaluation Continue aspirin We'll start him on cholesterol-lowering medication Physical therapy evaluation  2. COPD without any evidence of exasperation Continue home inhalers  3. Diabetes type 2 check a hemoglobin A1c Place on sliding scale insulin  4. Nicotine abuse smoking cessation provided recommend patient stop smoking 4 minutes spent nicotine patch will be offered  5. CODE STATUS confirmed with the patient he wants to be a DO NOT RESUSCITATE        All the records are reviewed and case discussed with ED provider. Management plans discussed with the patient, family and they are in agreement.  CODE STATUS:    Code Status Orders        Start     Ordered   12/15/16 1841  Do not attempt resuscitation (DNR)  Continuous    Question Answer Comment  In the event of cardiac or respiratory ARREST Do not call a "code blue"   In the event of cardiac or respiratory ARREST Do not perform Intubation, CPR, defibrillation or ACLS   In the event of cardiac or respiratory ARREST Use medication by any route, position, wound care, and other measures to relive pain and suffering. May use oxygen, suction and manual treatment of airway obstruction as needed for comfort.      12/15/16 1840    Code Status History    Date Active Date Inactive Code Status Order ID Comments User Context  May 02, 202018  9:22 PM 11/02/2016  5:40 PM DNR [id number]  Loletha Grayer, MD ED   03/17/2012  5:42 AM 03/19/2012  8:14 PM Full Code [id number]  Marylou Mccoy, RN Inpatient   01/30/2012  4:56 PM 02/03/2012  6:59 PM Full Code [id number]  Murlean Iba, MD Inpatient   08/11/2011  6:03 PM 08/13/2011  6:35 PM Full Code [id number]  Loyal Gambler,  RN Inpatient       TOTAL TIME TAKING CARE OF THIS PATIENT:70mnutes.    PDustin FlockM.D on 12/15/2016 at 6:48 PM  Between 7am to 6pm - Pager - [phone]  After 6pm go to [email or web address] - password [email or web address]  Office  [phone] CC: Primary care physician; Pcp Not In System

## 2016-12-15 NOTE — Progress Notes (Signed)
Chaplain responded to a code stroke and patient was not in the room. Chaplain asked the unit secretary if there were any family members, but she said there were no family members. Chaplain went to check in cath lab waiting room, but there were no family member. A follow up by a chaplain assigned at this unit would be helpful.   12/15/16 2200  Clinical Encounter Type  Visited With Health care provider  Visit Type Initial  Referral From Nurse  Consult/Referral To Chaplain

## 2016-12-15 NOTE — ED Notes (Signed)
Patient transported to CT with rn/monitor

## 2016-12-15 NOTE — ED Notes (Signed)
Pt reports he urinated but sample was not saved.

## 2016-12-15 NOTE — ED Notes (Addendum)
Tele neuro consult on screen

## 2016-12-15 NOTE — ED Notes (Signed)
No needs at this time.

## 2016-12-15 NOTE — ED Triage Notes (Signed)
Pt c/o getting lightheaded 1.5 hr prior to arrival.  Had unsteady gait and was staggering. Unsteady with EMS. Traveling from [place] to [facility]. Also c/o bilateral blurred vision.

## 2016-12-15 NOTE — ED Notes (Signed)
CODE STROKE CALLED TO 333 

## 2016-12-15 NOTE — ED Notes (Signed)
Pt with unsteady gait when ambulated for neuro consult.

## 2016-12-15 NOTE — ED Notes (Signed)
Pt attempting to obtain urine specimen 

## 2016-12-16 ENCOUNTER — Inpatient Hospital Stay (HOSPITAL_COMMUNITY)
Admit: 2016-12-16 | Discharge: 2016-12-16 | Disposition: A | Discharge status: Home / Self Care | Payer: Medicare (Managed Care) | Attending: Internal Medicine | Admitting: Internal Medicine

## 2016-12-16 ENCOUNTER — Inpatient Hospital Stay: Payer: Medicare (Managed Care)

## 2016-12-16 DIAGNOSIS — I639 Cerebral infarction, unspecified: Secondary | ICD-10-CM

## 2016-12-16 DIAGNOSIS — R42 Dizziness and giddiness: Principal | ICD-10-CM

## 2016-12-16 DIAGNOSIS — I6350 Cerebral infarction due to unspecified occlusion or stenosis of unspecified cerebral artery: Secondary | ICD-10-CM

## 2016-12-16 LAB — ECHOCARDIOGRAM COMPLETE
HEIGHTINCHES: 71 in
WEIGHTICAEL: 2745.6 [oz_av]

## 2016-12-16 LAB — LIPID PANEL
Cholesterol: 189 mg/dL (ref 0–200)
HDL: 55 mg/dL (ref >40)
LDL CALC: 108 mg/dL — AB (ref 0–99)
Total CHOL/HDL Ratio: 3.4 RATIO
Triglycerides: 132 mg/dL (ref <150)
VLDL: 26 mg/dL (ref 0–40)

## 2016-12-16 LAB — GLUCOSE, CAPILLARY
GLUCOSE-CAPILLARY: 95 mg/dL (ref 65–99)
GLUCOSE-CAPILLARY: 73 mg/dL (ref 65–99)
Glucose-Capillary: 66 mg/dL (ref 65–99)

## 2016-12-16 MED ORDER — ATORVASTATIN CALCIUM 40 MG PO TABS
40.0000 mg | ORAL_TABLET | Freq: Every day | ORAL | 0 refills | Status: DC
Start: 2016-12-16 — End: 2018-07-29

## 2016-12-16 NOTE — Consult Note (Signed)
Reason for Consult: blurry vision, both eyes. Referring Physician: Bettey Costa  MD. Chief complaint:  Blurry vision  HPI: [name] [name] is an 83 y.o. male with no significant past ocular history who complains of one day of blurry vision.  He reports that [place] when the blurry vision began in both eyes.  Since then, it has improved but has not returned entirely to normal.  He also had dizziness, an unsteady gait when he stopped at the rest area so 911 was called and he was brought to [facility]. An CTA, CT, MRI, carotid dopplers were performed.  He endorses unsteadiness and continued bilateral blurry vision, but no flashes, floaters, eye pain, or peripheral vision loss.   Past Medical History:  Diagnosis Date  . ABSCESS 12/03/2009  . ABSCESS, FINGER 04/07/2010  . ANXIETY 11/03/2009  . ASTHMA 11/03/2009  . CHRONIC OBSTRUCTIVE PULMONARY DISEASE, ACUTE EXACERBATION 11/03/2009  . COPD 11/03/2009  . DEPRESSION 11/03/2009  . DIABETES MELLITUS, TYPE II 11/03/2009  . [place] DISEASE, LUMBAR 11/03/2009  . EMPHYSEMA, BULLOUS 11/03/2009  . GERD 11/03/2009  . HYPERLIPIDEMIA 11/03/2009  . HYPERTENSION 11/03/2009  . Kidney stones 01/30/12   "I've had them 7 times; always have passed them"  . PEPTIC ULCER DISEASE 11/03/2009  . Pneumonia   . RASH-NONVESICULAR 11/03/2009  . RESTLESS LEG SYNDROME 11/03/2009  . Shortness of breath    "sometimes; at any time"  . SPINAL STENOSIS, LUMBAR 11/03/2009    ROS  Past Surgical History:  Procedure Laterality Date  . [place]   left  . INGUINAL HERNIA REPAIR  10/2011   left  . ROTATOR CUFF REPAIR  2003   left  . TONSILLECTOMY  1960    Family History  Problem Relation Age of Onset  . Heart disease Father   . Heart disease Mother   . Cancer Brother     lung    Social History:  reports that he has been smoking Cigarettes.  He has a 41.00 pack-year smoking history. He has never used smokeless tobacco. He reports that  he does not drink alcohol or use drugs.  Allergies: No Known Allergies  Prior to Admission medications   Medication Sig Start Date End Date Taking? Authorizing Provider  albuterol (PROVENTIL HFA;VENTOLIN HFA) 108 (90 Base) MCG/ACT inhaler Inhale 1-2 puffs into the lungs every 6 (six) hours as needed for wheezing or shortness of breath.  06/03/13  Yes Historical Provider, MD  aspirin EC 81 MG tablet Take 81 mg by mouth daily.   Yes Historical Provider, MD  Fluticasone-Salmeterol (ADVAIR) 100-50 MCG/DOSE AEPB Inhale 1 puff into the lungs 2 (two) times daily.   Yes Historical Provider, MD  lisinopril (PRINIVIL,ZESTRIL) 10 MG tablet Take 10 mg by mouth daily.   Yes Historical Provider, MD  predniSONE (STERAPRED UNI-PAK 21 TAB) 10 MG (21) TBPK tablet Take 6 tabs by mouth daily  for 2 days, then 5 tabs for 2 days, then 4 tabs for 2 days, then 3 tabs for 2 days, 2 tabs for 2 days, then 1 tab by mouth daily for 2 days 12/08/16  Yes Isla Pence, MD  atorvastatin (LIPITOR) 40 MG tablet Take 1 tablet (40 mg total) by mouth daily. 12/16/16   Bettey Costa, MD    Results for orders placed or performed during the hospital encounter of 12/15/16 (from the past 48 hour(s))  Basic metabolic panel     Status: Abnormal   Collection Time: 12/15/16  3:54 PM  Result Value Ref Range   Sodium 137 135 - 145 mmol/L   Potassium 3.9 3.5 - 5.1 mmol/L    Comment: HEMOLYSIS AT THIS LEVEL MAY AFFECT RESULT   Chloride 104 101 - 111 mmol/L   CO2 27 22 - 32 mmol/L   Glucose, Bld 108 (H) 65 - 99 mg/dL   BUN 23 (H) 6 - 20 mg/dL   Creatinine, Ser 1.02 0.61 - 1.24 mg/dL   Calcium 8.8 (L) 8.9 - 10.3 mg/dL   GFR calc non Af Amer >60 >60 mL/min   GFR calc Af Amer >60 >60 mL/min    Comment: (NOTE) The eGFR has been calculated using the CKD EPI equation. This calculation has not been validated in all clinical situations. eGFR's persistently <60 mL/min signify possible Chronic Kidney Disease.    Anion gap 6 5 - 15  CBC      Status: Abnormal   Collection Time: 12/15/16  3:54 PM  Result Value Ref Range   WBC 12.0 (H) 3.8 - 10.6 K/uL   RBC 4.90 4.40 - 5.90 MIL/uL   Hemoglobin 15.0 13.0 - 18.0 g/dL   HCT 44.7 40.0 - 52.0 %   MCV 91.2 80.0 - 100.0 fL   MCH 30.7 26.0 - 34.0 pg   MCHC 33.6 32.0 - 36.0 g/dL   RDW 14.4 11.5 - 14.5 %   Platelets 237 150 - 440 K/uL  Troponin I     Status: None   Collection Time: 12/15/16  3:54 PM  Result Value Ref Range   Troponin I <0.03 <0.03 ng/mL  Hepatic function panel     Status: None   Collection Time: 12/15/16  3:54 PM  Result Value Ref Range   Total Protein 6.5 6.5 - 8.1 g/dL   Albumin 3.6 3.5 - 5.0 g/dL   AST 19 15 - 41 U/L   ALT 19 17 - 63 U/L   Alkaline Phosphatase 61 38 - 126 U/L   Total Bilirubin 0.9 0.3 - 1.2 mg/dL   Bilirubin, Direct 0.2 0.1 - 0.5 mg/dL   Indirect Bilirubin 0.7 0.3 - 0.9 mg/dL  Differential     Status: Abnormal   Collection Time: 12/15/16  3:54 PM  Result Value Ref Range   Neutrophils Relative % 79 %   Neutro Abs 8.9 (H) 1.4 - 6.5 K/uL   Lymphocytes Relative 13 %   Lymphs Abs 1.5 1.0 - 3.6 K/uL   Monocytes Relative 5 %   Monocytes Absolute 0.6 0.2 - 1.0 K/uL   Eosinophils Relative 2 %   Eosinophils Absolute 0.2 0 - 0.7 K/uL   Basophils Relative 1 %   Basophils Absolute 0.1 0 - 0.1 K/uL  Protime-INR     Status: None   Collection Time: 12/15/16  4:11 PM  Result Value Ref Range   Prothrombin Time 12.5 11.4 - 15.2 seconds   INR 0.93   APTT     Status: None   Collection Time: 12/15/16  4:11 PM  Result Value Ref Range   aPTT 28 24 - 36 seconds  Ethanol     Status: None   Collection Time: 12/15/16  4:25 PM  Result Value Ref Range   Alcohol, Ethyl (B) <5 <5 mg/dL    Comment:        LOWEST DETECTABLE LIMIT FOR SERUM ALCOHOL IS 5 mg/dL FOR MEDICAL PURPOSES ONLY   Glucose, capillary     Status: None   Collection Time: 12/15/16  4:28 PM  Result  Value Ref Range   Glucose-Capillary 89 65 - 99 mg/dL  Urinalysis, Routine w reflex  microscopic     Status: Abnormal   Collection Time: 12/15/16  7:10 PM  Result Value Ref Range   Color, Urine YELLOW (A) YELLOW   APPearance CLEAR (A) CLEAR   Specific Gravity, Urine >1.046 (H) 1.005 - 1.030   pH 7.0 5.0 - 8.0   Glucose, UA NEGATIVE NEGATIVE mg/dL   Hgb urine dipstick NEGATIVE NEGATIVE   Bilirubin Urine NEGATIVE NEGATIVE   Ketones, ur NEGATIVE NEGATIVE mg/dL   Protein, ur NEGATIVE NEGATIVE mg/dL   Nitrite NEGATIVE NEGATIVE   Leukocytes, UA NEGATIVE NEGATIVE  Urine Drug Screen, Qualitative ([facility] only)     Status: Abnormal   Collection Time: 12/15/16  7:10 PM  Result Value Ref Range   Tricyclic, Ur Screen NONE DETECTED NONE DETECTED   Amphetamines, Ur Screen NONE DETECTED NONE DETECTED   MDMA (Ecstasy)Ur Screen NONE DETECTED NONE DETECTED   Cocaine Metabolite,Ur [place] NONE DETECTED NONE DETECTED   Opiate, Ur Screen NONE DETECTED NONE DETECTED   Phencyclidine (PCP) Ur S NONE DETECTED NONE DETECTED   Cannabinoid 50 Ng, Ur [place] NONE DETECTED NONE DETECTED   Barbiturates, Ur Screen POSITIVE (A) NONE DETECTED   Benzodiazepine, Ur Scrn NONE DETECTED NONE DETECTED   Methadone Scn, Ur NONE DETECTED NONE DETECTED    Comment: (NOTE) 132  Tricyclics, urine               Cutoff 1000 ng/mL 200  Amphetamines, urine             Cutoff 1000 ng/mL 300  MDMA (Ecstasy), urine           Cutoff 500 ng/mL 400  Cocaine Metabolite, urine       Cutoff 300 ng/mL 500  Opiate, urine                   Cutoff 300 ng/mL 600  Phencyclidine (PCP), urine      Cutoff 25 ng/mL 700  Cannabinoid, urine              Cutoff 50 ng/mL 800  Barbiturates, urine             Cutoff 200 ng/mL 900  Benzodiazepine, urine           Cutoff 200 ng/mL 1000 Methadone, urine                Cutoff 300 ng/mL 1100 1200 The urine drug screen provides only a preliminary, unconfirmed 1300 analytical test result and should not be used for non-medical 1400 purposes. Clinical consideration and professional judgment should 1500  be applied to any positive drug screen result due to possible 1600 interfering substances. A more specific alternate chemical method 1700 must be used in order to obtain a confirmed analytical result.  1800 Gas chromato graphy / mass spectrometry (GC/MS) is the preferred 1900 confirmatory method.   Glucose, capillary     Status: Abnormal   Collection Time: 12/15/16  7:15 PM  Result Value Ref Range   Glucose-Capillary 112 (H) 65 - 99 mg/dL  Glucose, capillary     Status: Abnormal   Collection Time: 12/15/16  9:00 PM  Result Value Ref Range   Glucose-Capillary 151 (H) 65 - 99 mg/dL  Lipid panel     Status: Abnormal   Collection Time: 12/16/16  4:13 AM  Result Value Ref Range   Cholesterol 189 0 - 200 mg/dL   Triglycerides 132 <150  mg/dL   HDL 55 >40 mg/dL   Total CHOL/HDL Ratio 3.4 RATIO   VLDL 26 0 - 40 mg/dL   LDL Cholesterol 108 (H) 0 - 99 mg/dL    Comment:        Total Cholesterol/HDL:CHD Risk Coronary Heart Disease Risk Table                     Men   Women  1/2 Average Risk   3.4   3.3  Average Risk       5.0   4.4  2 X Average Risk   9.6   7.1  3 X Average Risk  23.4   11.0        Use the calculated Patient Ratio above and the CHD Risk Table to determine the patient's CHD Risk.        ATP III CLASSIFICATION (LDL):  <100     mg/dL   Optimal  100-129  mg/dL   Near or Above                    Optimal  130-159  mg/dL   Borderline  160-189  mg/dL   High  >190     mg/dL   Very High   Glucose, capillary     Status: None   Collection Time: 12/16/16  7:16 AM  Result Value Ref Range   Glucose-Capillary 95 65 - 99 mg/dL  Glucose, capillary     Status: None   Collection Time: 12/16/16 11:44 AM  Result Value Ref Range   Glucose-Capillary 73 65 - 99 mg/dL    Ct Angio Head W Or Wo Contrast  Result Date: 12/15/2016 CLINICAL DATA:  Lightheaded 1.5 hours prior to arrival. Code stroke from earlier. EXAM: CT ANGIOGRAPHY HEAD AND NECK TECHNIQUE: Multidetector CT imaging of  the head and neck was performed using the standard protocol during bolus administration of intravenous contrast. Multiplanar CT image reconstructions and MIPs were obtained to evaluate the vascular anatomy. Carotid stenosis measurements (when applicable) are obtained utilizing NASCET criteria, using the distal internal carotid diameter as the denominator. CONTRAST:  Isovue 370, 80 mL. COMPARISON:  CT head earlier today. Multiple prior cross-sectional imaging studies including previous MR from 05/16/2012 FINDINGS: CTA NECK Aortic arch: Standard branching. Imaged portion shows no evidence of aneurysm or dissection. No significant stenosis of the major arch vessel origins. Mild atheromatous change transverse arch. Right carotid system: Mild atheromatous change at the bifurcation. No evidence of dissection, stenosis (50% or greater) or occlusion. Left carotid system: Mild atheromatous change at the bifurcation. No evidence of dissection, stenosis (50% or greater) or occlusion. Vertebral arteries: Codominant. No evidence of dissection, stenosis (50% or greater) or occlusion. Mild calcific plaque adjacent to the LEFT vertebral origin, but no narrowing. Nonvascular soft tissues: Severe COPD with biapical bullous change, compressive atelectasis in the RIGHT upper lobe. No definite nodule or adenopathy. Spondylosis. No neck masses. CTA HEAD Anterior circulation: The internal carotid arteries are widely patent. There is no significant disease involving the anterior or middle cerebral arteries. No significant stenosis, proximal occlusion, aneurysm, or vascular malformation. Posterior circulation: Both vertebral arteries contribute to basilar formation. No significant stenosis, proximal occlusion, aneurysm, or vascular malformation. Venous sinuses: As permitted by contrast timing, patent. Anatomic variants: None of significance. Delayed phase:  Not performed. Review of the MIP images confirms the above findings IMPRESSION:  Unremarkable CTA extracranial and intracranial circulation. No flow-limiting stenosis, dissection, significant ulceration, or significant intracranial disease.  Electronically Signed   By: Staci Righter M.D.   On: 12/15/2016 17:51   Ct Angio Neck W And/or Wo Contrast  Result Date: 12/15/2016 CLINICAL DATA:  Lightheaded 1.5 hours prior to arrival. Code stroke from earlier. EXAM: CT ANGIOGRAPHY HEAD AND NECK TECHNIQUE: Multidetector CT imaging of the head and neck was performed using the standard protocol during bolus administration of intravenous contrast. Multiplanar CT image reconstructions and MIPs were obtained to evaluate the vascular anatomy. Carotid stenosis measurements (when applicable) are obtained utilizing NASCET criteria, using the distal internal carotid diameter as the denominator. CONTRAST:  Isovue 370, 80 mL. COMPARISON:  CT head earlier today. Multiple prior cross-sectional imaging studies including previous MR from 05/16/2012 FINDINGS: CTA NECK Aortic arch: Standard branching. Imaged portion shows no evidence of aneurysm or dissection. No significant stenosis of the major arch vessel origins. Mild atheromatous change transverse arch. Right carotid system: Mild atheromatous change at the bifurcation. No evidence of dissection, stenosis (50% or greater) or occlusion. Left carotid system: Mild atheromatous change at the bifurcation. No evidence of dissection, stenosis (50% or greater) or occlusion. Vertebral arteries: Codominant. No evidence of dissection, stenosis (50% or greater) or occlusion. Mild calcific plaque adjacent to the LEFT vertebral origin, but no narrowing. Nonvascular soft tissues: Severe COPD with biapical bullous change, compressive atelectasis in the RIGHT upper lobe. No definite nodule or adenopathy. Spondylosis. No neck masses. CTA HEAD Anterior circulation: The internal carotid arteries are widely patent. There is no significant disease involving the anterior or middle  cerebral arteries. No significant stenosis, proximal occlusion, aneurysm, or vascular malformation. Posterior circulation: Both vertebral arteries contribute to basilar formation. No significant stenosis, proximal occlusion, aneurysm, or vascular malformation. Venous sinuses: As permitted by contrast timing, patent. Anatomic variants: None of significance. Delayed phase:  Not performed. Review of the MIP images confirms the above findings IMPRESSION: Unremarkable CTA extracranial and intracranial circulation. No flow-limiting stenosis, dissection, significant ulceration, or significant intracranial disease. Electronically Signed   By: Staci Righter M.D.   On: 12/15/2016 17:51   Mr Brain Wo Contrast  Result Date: 12/16/2016 CLINICAL DATA:  Acute presentation with blurred vision, dizziness and gait disturbance. EXAM: MRI HEAD WITHOUT CONTRAST TECHNIQUE: Multiplanar, multiecho pulse sequences of the brain and surrounding structures were obtained without intravenous contrast. COMPARISON:  CT 12/15/2016.  MRI 05/22/2012. FINDINGS: Brain: Diffusion imaging does not show any acute or subacute infarction. There are mild chronic small-vessel ischemic changes of the pons. No focal cerebellar insult. Old left thalamic lacunar infarction, not present in 2013. Cerebral hemispheres show mild chronic small-vessel change of the deep and subcortical white matter. No cortical or large vessel territory infarction. Vascular: Major vessels at the base of the brain show flow. Skull and upper cervical spine: Negative Sinuses/Orbits: Clear/normal Other: None significant IMPRESSION: No acute finding. No cause of the presenting symptoms is identified. Chronic small-vessel ischemic changes of the pons and hemispheric white matter. Old left thalamic lacunar infarction. Electronically Signed   By: Nelson Chimes M.D.   On: 12/16/2016 10:35   Ct Head Code Stroke W/o Cm  Result Date: 12/15/2016 CLINICAL DATA:  Code stroke.  Ataxia left face  droop since 2:30. EXAM: CT HEAD WITHOUT CONTRAST TECHNIQUE: Contiguous axial images were obtained from the base of the skull through the vertex without intravenous contrast. COMPARISON:  05/24/2012 FINDINGS: Brain: No evidence of acute infarction, hemorrhage, hydrocephalus, extra-axial collection or mass lesion/mass effect. Normal appearance for age. Vascular: No hyperdense vessel or unexpected calcification. Skull: Unremarkable Sinuses/Orbits: No acute  finding. Other: These results were called by telephone at the time of interpretation on 12/15/2016 at 4:26 pm to Dr. Harvest Dark , who verbally acknowledged these results. ASPECTS [facility] Stroke Program Early CT Score) - Ganglionic level infarction (caudate, lentiform nuclei, internal capsule, insula, M1-M3 cortex): 7 - Supraganglionic infarction (M4-M6 cortex): 3 Total score (0-10 with 10 being normal): 10 IMPRESSION: No acute finding. ASPECTS is 10. Electronically Signed   By: Monte Fantasia M.D.   On: 12/15/2016 16:29    Blood pressure 98/60, pulse 82, temperature 98.4 F (36.9 C), temperature source Oral, resp. rate 20, height _0  (1.803 m), weight 77.8 kg (171 lb 9.6 oz), SpO2 95 %.  Mental status: Alert and Oriented x 4  Visual Acuity:  20/20- OD  20/25- near cc  Pupils:  Equally round/ reactive to light.  No Afferent defect.  Motility:  Full/ orthophoric  Visual Fields:  Full to confrontation  IOP:  Soft to digital palpation  External/ Lids/ Lashes:  Normal  Anterior Segment:  Conjunctiva:  Normal  OU  Cornea:  Normal  OU  Anterior Chamber: Normal  OU  Lens:   2+ Nuclear sclerosis OU, cortical dots and trace central PSC, normal for age.  Posterior Segment: Dilated OU with 1% Tropicamide and 2.5% Phenylephrine 3:28 pm bmk.   Discs:   c/d larger than normal, about 0.7 OD, 0.75 OS, moderately thin temporal rim. no pallor, no edema OU  Macula:  Normal  Vessels/ Periphery: Normal    Assessment/Plan: 1.  Blurry vision,  bilateral, improving.  Unclear etiology, may be secondary to ocular surface disease.  Overall reassuring eye exam without acute abnormalities.  2.  Cataracts in both eyes. 3.  Glaucoma suspect, based on appearance of optic nerves.  Not acute, should have outpatient followup.  Recommend outpatient followup with ophthalmology in one month.  Disscussed and counseled patient.   Benay Pillow 12/16/2016, 3:36 PM

## 2016-12-16 NOTE — Care Management (Signed)
MD has cleared patient for discharge.  Aware that patient's plan to sleep in car, and drive to [facility].  Patient has declined for me to speak with his son, and notify him of admission. Spoke with [employer] who approved taxi voucher to rest stop, as well as $25 petty cash for patient.  Patient was provided $25 cash, and reminded to take walker at discharge.  Bedside RN given taxi voucher to arrange transport.  RNCM signing off

## 2016-12-16 NOTE — Evaluation (Signed)
Occupational Therapy Evaluation Patient Details Name: [name] [name] MRN: [id number] DOB: April 05, 1934 Today's Date: 12/16/2016    History of Present Illness [name] [name]  is a 83 y.o. male who was driving from [place] to [place] when he noticed blurred vision, dizziness, and increased unsteadiness. Pt. pulled over to a rest area, and called EMS. Pt. reports his car is still parked at the rest area. PMHx includes: anxiety, asthma, COPD, depression, diabetes type 2, GERD, hyper-tension and hyperlipidemia.   Clinical Impression   Pt. Is an 83 y.o. Male who was admitted with blurred vision, dizziness, and increased unsteadiness. Pt. presents with blurred vision, pain in right shoulder with movement, limited balance and unsteadiness which hinder his ability to complete ADL, and IADL tasks efficiently. Pt. could benefit from OT services for pt. education about visual compensatory techniques, and work simplification strategies during ADL, and IADL tasks.Pt. reports planning to continue his drive to [place], [place] upon discharge.    Follow Up Recommendations   No follow-up OT services   Equipment Recommendations       Recommendations for Other Services       Precautions / Restrictions Precautions Precautions: Fall Restrictions Weight Bearing Restrictions: Yes         Balance Overall balance assessment: Needs assistance Sitting-balance support: No upper extremity supported Sitting balance-Leahy Scale: Good     Standing balance support: No upper extremity supported Standing balance-Leahy Scale: Fair Standing balance comment: Negative [place]. Unable to maintain tandem or single leg stance for greater than 1-2 seconds                           ADL either performed or assessed with clinical judgement   ADL Overall ADL's : Needs assistance/impaired Eating/Feeding: Independent   Grooming: Supervision/safety           Upper Body Dressing : Independent   Lower Body  Dressing: Independent       Toileting- Clothing Manipulation and Hygiene: Supervision/safety       Functional mobility during ADLs: Supervision/safety       Vision Baseline Vision/History: Wears glasses Patient Visual Report: Blurring of vision (Right eye worse than left. Pt. reports it is better than yesterday.  Intact tracking, scanning, eye alignment, and visual pursuits. No visual field deficits observed, or evident during ADL tasks) Vision Assessment?: Vision impaired- to be further tested in functional context     Perception     Praxis      Pertinent Vitals/Pain Pain Assessment: 0-10 Pain Score: 3  Pain Location: right shoulde with flexion Pain Intervention(s): Monitored during session     Hand Dominance Right   Extremity/Trunk Assessment Upper Extremity Assessment Upper Extremity Assessment: RUE deficits/detail (Grip strength: R: 60, L: 48) RUE Deficits / Details: Pain in right shoulder from a fall a few months ago.   Lower Extremity Assessment Lower Extremity Assessment: Overall WFL for tasks assessed       Communication Communication Communication: No difficulties   Cognition Arousal/Alertness: Awake/alert Behavior During Therapy: WFL for tasks assessed/performed Overall Cognitive Status: Within Functional Limits for tasks assessed                                     General Comments       Exercises     Shoulder Instructions      Home Living Family/patient expects to be discharged to:: Private  residence (In [place]) Living Arrangements: Alone Available Help at Discharge: Family Type of Home:  (Condo) Home Access: Level entry     Home Layout: One level     Bathroom Shower/Tub: Teacher, early years/pre: Standard     Home Equipment: None          Prior Functioning/Environment Level of Independence: Independent        Comments: Independent with ADLs, IADLs, driving long distances. 3 falls in the past 6  months.        OT Problem List:        OT Treatment/Interventions:      OT Goals(Current goals can be found in the care plan section) Acute Rehab OT Goals Patient Stated Goal: Return to prior level of function  OT Frequency:     Barriers to D/C:            Co-evaluation              End of Session Equipment Utilized During Treatment: Gait belt  Activity Tolerance: Patient tolerated treatment well Patient left: in bed                   Time: 1040-1110 OT Time Calculation (min): 30 min Charges:  OT General Charges $OT Visit: 1 Procedure OT Evaluation $OT Eval Moderate Complexity: 1 Procedure G-Codes:     Harrel Carina, MS, OTR/L   Harrel Carina, MS, OTR/L 12/16/2016, 12:39 PM

## 2016-12-16 NOTE — Discharge Summary (Signed)
[name] [name] at [facility] NAME: Bejamin [name]    MR#:  [id number]  DATE OF BIRTH:  [birth year]  DATE OF ADMISSION:  12/15/2016 ADMITTING PHYSICIAN: Dustin Flock, MD  DATE OF DISCHARGE: 12/16/2016  PRIMARY CARE PHYSICIAN: Pcp Not In System    ADMISSION DIAGNOSIS:  Dizziness [R42]  DISCHARGE DIAGNOSIS:  Dizziness Blurred vision  SECONDARY DIAGNOSIS:   Past Medical History:  Diagnosis Date  . ABSCESS 12/03/2009  . ABSCESS, FINGER 04/07/2010  . ANXIETY 11/03/2009  . ASTHMA 11/03/2009  . CHRONIC OBSTRUCTIVE PULMONARY DISEASE, ACUTE EXACERBATION 11/03/2009  . COPD 11/03/2009  . DEPRESSION 11/03/2009  . DIABETES MELLITUS, TYPE II 11/03/2009  . [place] DISEASE, LUMBAR 11/03/2009  . EMPHYSEMA, BULLOUS 11/03/2009  . GERD 11/03/2009  . HYPERLIPIDEMIA 11/03/2009  . HYPERTENSION 11/03/2009  . Kidney stones 01/30/12   "I've had them 7 times; always have passed them"  . PEPTIC ULCER DISEASE 11/03/2009  . Pneumonia   . RASH-NONVESICULAR 11/03/2009  . RESTLESS LEG SYNDROME 11/03/2009  . Shortness of breath    "sometimes; at any time"  . SPINAL STENOSIS, LUMBAR 11/03/2009    HOSPITAL COURSE:  83 year old male with a history of COPD presented with dizziness and unsteady gait.  2. Dizziness with unsteady gait: Patient is being evaluated for CVA MRI shows no CVA Echo showed no cardiac source of emboli. By ophthalmology in addition to neurology was here. Patient has good vision. Blurred vision has improved  As per ophthalmology may be secondary to ocular surface disease.  He had a reassuring eye exam. He should have outpatient ophthalmology follow-up within 1 month.     Continue aspirin and statin LDL 108  2. COPD without signs of exacerbation: Continue inhalers   3. Diabetes: Continue sliding scale insulin  4. Essential hypertension: Continue lisinopril   DISCHARGE CONDITIONS AND DIET:   Stable diabetic  CONSULTS OBTAINED:  Treatment Team:   Alexis Goodell, MD  DRUG ALLERGIES:  No Known Allergies  DISCHARGE MEDICATIONS:   Discharge Medication List as of 12/16/2016  5:10 PM    START taking these medications   Details  atorvastatin (LIPITOR) 40 MG tablet Take 1 tablet (40 mg total) by mouth daily., Starting Fri 12/16/2016, Normal      CONTINUE these medications which have NOT CHANGED   Details  albuterol (PROVENTIL HFA;VENTOLIN HFA) 108 (90 Base) MCG/ACT inhaler Inhale 1-2 puffs into the lungs every 6 (six) hours as needed for wheezing or shortness of breath. , Starting Mon 06/03/2013, Historical Med    aspirin EC 81 MG tablet Take 81 mg by mouth daily., Historical Med    Fluticasone-Salmeterol (ADVAIR) 100-50 MCG/DOSE AEPB Inhale 1 puff into the lungs 2 (two) times daily., Historical Med    lisinopril (PRINIVIL,ZESTRIL) 10 MG tablet Take 10 mg by mouth daily., Historical Med      STOP taking these medications     predniSONE (STERAPRED UNI-PAK 21 TAB) 10 MG (21) TBPK tablet           Today   CHIEF COMPLAINT:  Blurred vision improved   VITAL SIGNS:  Blood pressure 98/60, pulse 82, temperature 98.4 F (36.9 C), temperature source Oral, resp. rate 20, height '5\' 11"'$  (1.803 m), weight 77.8 kg (171 lb 9.6 oz), SpO2 95 %.   REVIEW OF SYSTEMS:  Review of Systems  Constitutional: Negative.  Negative for chills, fever and malaise/fatigue.  HENT: Negative.  Negative for ear discharge, ear pain, hearing loss, nosebleeds and sore throat.   Eyes:  Positive for blurred vision. Negative for pain.  Respiratory: Negative.  Negative for cough, hemoptysis, shortness of breath and wheezing.   Cardiovascular: Negative.  Negative for chest pain, palpitations and leg swelling.  Gastrointestinal: Negative.  Negative for abdominal pain, blood in stool, diarrhea, nausea and vomiting.  Genitourinary: Negative.  Negative for dysuria.  Musculoskeletal: Negative.  Negative for back pain.  Skin: Negative.   Neurological: Negative  for dizziness, tremors, speech change, focal weakness, seizures and headaches.  Endo/Heme/Allergies: Negative.  Does not bruise/bleed easily.  Psychiatric/Behavioral: Negative.  Negative for depression, hallucinations and suicidal ideas.     PHYSICAL EXAMINATION:  GENERAL:  83 y.o.-year-old patient lying in the bed with no acute distress.  NECK:  Supple, no jugular venous distention. No thyroid enlargement, no tenderness.  LUNGS: Normal breath sounds bilaterally, no wheezing, rales,rhonchi  No use of accessory muscles of respiration.  CARDIOVASCULAR: S1, S2 normal. No murmurs, rubs, or gallops.  ABDOMEN: Soft, non-tender, non-distended. Bowel sounds present. No organomegaly or mass.  EXTREMITIES: No pedal edema, cyanosis, or clubbing.  PSYCHIATRIC: The patient is alert and oriented x 3.  SKIN: No obvious rash, lesion, or ulcer.   DATA REVIEW:   CBC  Recent Labs Lab 12/15/16 1554  WBC 12.0*  HGB 15.0  HCT 44.7  PLT 237    Chemistries   Recent Labs Lab 12/15/16 1554  NA 137  K 3.9  CL 104  CO2 27  GLUCOSE 108*  BUN 23*  CREATININE 1.02  CALCIUM 8.8*  AST 19  ALT 19  ALKPHOS 61  BILITOT 0.9    Cardiac Enzymes  Recent Labs Lab 12/15/16 1554  TROPONINI <0.03    Microbiology Results  '@[id number]'$ @  RADIOLOGY:  Ct Angio Head W Or Wo Contrast  Result Date: 12/15/2016 CLINICAL DATA:  Lightheaded 1.5 hours prior to arrival. Code stroke from earlier. EXAM: CT ANGIOGRAPHY HEAD AND NECK TECHNIQUE: Multidetector CT imaging of the head and neck was performed using the standard protocol during bolus administration of intravenous contrast. Multiplanar CT image reconstructions and MIPs were obtained to evaluate the vascular anatomy. Carotid stenosis measurements (when applicable) are obtained utilizing NASCET criteria, using the distal internal carotid diameter as the denominator. CONTRAST:  Isovue 370, 80 mL. COMPARISON:  CT head earlier today. Multiple prior  cross-sectional imaging studies including previous MR from 05/16/2012 FINDINGS: CTA NECK Aortic arch: Standard branching. Imaged portion shows no evidence of aneurysm or dissection. No significant stenosis of the major arch vessel origins. Mild atheromatous change transverse arch. Right carotid system: Mild atheromatous change at the bifurcation. No evidence of dissection, stenosis (50% or greater) or occlusion. Left carotid system: Mild atheromatous change at the bifurcation. No evidence of dissection, stenosis (50% or greater) or occlusion. Vertebral arteries: Codominant. No evidence of dissection, stenosis (50% or greater) or occlusion. Mild calcific plaque adjacent to the LEFT vertebral origin, but no narrowing. Nonvascular soft tissues: Severe COPD with biapical bullous change, compressive atelectasis in the RIGHT upper lobe. No definite nodule or adenopathy. Spondylosis. No neck masses. CTA HEAD Anterior circulation: The internal carotid arteries are widely patent. There is no significant disease involving the anterior or middle cerebral arteries. No significant stenosis, proximal occlusion, aneurysm, or vascular malformation. Posterior circulation: Both vertebral arteries contribute to basilar formation. No significant stenosis, proximal occlusion, aneurysm, or vascular malformation. Venous sinuses: As permitted by contrast timing, patent. Anatomic variants: None of significance. Delayed phase:  Not performed. Review of the MIP images confirms the above findings IMPRESSION: Unremarkable CTA extracranial and  intracranial circulation. No flow-limiting stenosis, dissection, significant ulceration, or significant intracranial disease. Electronically Signed   By: Staci Righter M.D.   On: 12/15/2016 17:51   Ct Angio Neck W And/or Wo Contrast  Result Date: 12/15/2016 CLINICAL DATA:  Lightheaded 1.5 hours prior to arrival. Code stroke from earlier. EXAM: CT ANGIOGRAPHY HEAD AND NECK TECHNIQUE: Multidetector CT  imaging of the head and neck was performed using the standard protocol during bolus administration of intravenous contrast. Multiplanar CT image reconstructions and MIPs were obtained to evaluate the vascular anatomy. Carotid stenosis measurements (when applicable) are obtained utilizing NASCET criteria, using the distal internal carotid diameter as the denominator. CONTRAST:  Isovue 370, 80 mL. COMPARISON:  CT head earlier today. Multiple prior cross-sectional imaging studies including previous MR from 05/16/2012 FINDINGS: CTA NECK Aortic arch: Standard branching. Imaged portion shows no evidence of aneurysm or dissection. No significant stenosis of the major arch vessel origins. Mild atheromatous change transverse arch. Right carotid system: Mild atheromatous change at the bifurcation. No evidence of dissection, stenosis (50% or greater) or occlusion. Left carotid system: Mild atheromatous change at the bifurcation. No evidence of dissection, stenosis (50% or greater) or occlusion. Vertebral arteries: Codominant. No evidence of dissection, stenosis (50% or greater) or occlusion. Mild calcific plaque adjacent to the LEFT vertebral origin, but no narrowing. Nonvascular soft tissues: Severe COPD with biapical bullous change, compressive atelectasis in the RIGHT upper lobe. No definite nodule or adenopathy. Spondylosis. No neck masses. CTA HEAD Anterior circulation: The internal carotid arteries are widely patent. There is no significant disease involving the anterior or middle cerebral arteries. No significant stenosis, proximal occlusion, aneurysm, or vascular malformation. Posterior circulation: Both vertebral arteries contribute to basilar formation. No significant stenosis, proximal occlusion, aneurysm, or vascular malformation. Venous sinuses: As permitted by contrast timing, patent. Anatomic variants: None of significance. Delayed phase:  Not performed. Review of the MIP images confirms the above findings  IMPRESSION: Unremarkable CTA extracranial and intracranial circulation. No flow-limiting stenosis, dissection, significant ulceration, or significant intracranial disease. Electronically Signed   By: Staci Righter M.D.   On: 12/15/2016 17:51   Mr Brain Wo Contrast  Result Date: 12/16/2016 CLINICAL DATA:  Acute presentation with blurred vision, dizziness and gait disturbance. EXAM: MRI HEAD WITHOUT CONTRAST TECHNIQUE: Multiplanar, multiecho pulse sequences of the brain and surrounding structures were obtained without intravenous contrast. COMPARISON:  CT 12/15/2016.  MRI 05/22/2012. FINDINGS: Brain: Diffusion imaging does not show any acute or subacute infarction. There are mild chronic small-vessel ischemic changes of the pons. No focal cerebellar insult. Old left thalamic lacunar infarction, not present in 2013. Cerebral hemispheres show mild chronic small-vessel change of the deep and subcortical white matter. No cortical or large vessel territory infarction. Vascular: Major vessels at the base of the brain show flow. Skull and upper cervical spine: Negative Sinuses/Orbits: Clear/normal Other: None significant IMPRESSION: No acute finding. No cause of the presenting symptoms is identified. Chronic small-vessel ischemic changes of the pons and hemispheric white matter. Old left thalamic lacunar infarction. Electronically Signed   By: Nelson Chimes M.D.   On: 12/16/2016 10:35   Ct Head Code Stroke W/o Cm  Result Date: 12/15/2016 CLINICAL DATA:  Code stroke.  Ataxia left face droop since 2:30. EXAM: CT HEAD WITHOUT CONTRAST TECHNIQUE: Contiguous axial images were obtained from the base of the skull through the vertex without intravenous contrast. COMPARISON:  05/24/2012 FINDINGS: Brain: No evidence of acute infarction, hemorrhage, hydrocephalus, extra-axial collection or mass lesion/mass effect. Normal appearance for age.  Vascular: No hyperdense vessel or unexpected calcification. Skull: Unremarkable  Sinuses/Orbits: No acute finding. Other: These results were called by telephone at the time of interpretation on 12/15/2016 at 4:26 pm to Dr. Harvest Dark , who verbally acknowledged these results. ASPECTS [facility] Stroke Program Early CT Score) - Ganglionic level infarction (caudate, lentiform nuclei, internal capsule, insula, M1-M3 cortex): 7 - Supraganglionic infarction (M4-M6 cortex): 3 Total score (0-10 with 10 being normal): 10 IMPRESSION: No acute finding. ASPECTS is 10. Electronically Signed   By: Monte Fantasia M.D.   On: 12/15/2016 16:29      Discharge Medication List as of 12/16/2016  5:10 PM    START taking these medications   Details  atorvastatin (LIPITOR) 40 MG tablet Take 1 tablet (40 mg total) by mouth daily., Starting Fri 12/16/2016, Normal      CONTINUE these medications which have NOT CHANGED   Details  albuterol (PROVENTIL HFA;VENTOLIN HFA) 108 (90 Base) MCG/ACT inhaler Inhale 1-2 puffs into the lungs every 6 (six) hours as needed for wheezing or shortness of breath. , Starting Mon 06/03/2013, Historical Med    aspirin EC 81 MG tablet Take 81 mg by mouth daily., Historical Med    Fluticasone-Salmeterol (ADVAIR) 100-50 MCG/DOSE AEPB Inhale 1 puff into the lungs 2 (two) times daily., Historical Med    lisinopril (PRINIVIL,ZESTRIL) 10 MG tablet Take 10 mg by mouth daily., Historical Med      STOP taking these medications     predniSONE (STERAPRED UNI-PAK 21 TAB) 10 MG (21) TBPK tablet            Management plans discussed with the patient and he is in agreement. Stable for discharge   Patient should follow up with pcp in [place]:     Code Status Orders        Start     Ordered   12/15/16 2055  Full code  Continuous     12/15/16 2054    Code Status History    Date Active Date Inactive Code Status Order ID Comments User Context   12/15/2016  6:40 PM 12/15/2016  8:54 PM DNR [id number]  Dustin Flock, MD ED   05-02-202018  9:22 PM 11/02/2016   5:40 PM DNR [id number]  Loletha Grayer, MD ED   03/17/2012  5:42 AM 03/19/2012  8:14 PM Full Code [id number]  Marylou Mccoy, RN Inpatient   01/30/2012  4:56 PM 02/03/2012  6:59 PM Full Code [id number]  Murlean Iba, MD Inpatient   08/11/2011  6:03 PM 08/13/2011  6:35 PM Full Code [id number]  Loyal Gambler, RN Inpatient      TOTAL TIME TAKING CARE OF THIS PATIENT: 35 minutes.    Note: This dictation was prepared with Dragon dictation along with smaller phrase technology. Any transcriptional errors that result from this process are unintentional.  Rip Hawes M.D on 12/17/2016 at 9:50 AM  Between 7am to 6pm - Pager - [phone] After 6pm go to [email or web address] - password [email or web address] [facility]  Office  [phone]  CC: Primary care physician; Pcp Not In System

## 2016-12-16 NOTE — Progress Notes (Signed)
[place] [facility] [place] NAME: [name] [name]    MR#:  [id number]  DATE OF BIRTH:  [birth year]  SUBJECTIVE:   Patient has blurred vision but dizziness gone away has not Ambulated since admission.  REVIEW OF SYSTEMS:    Review of Systems  Constitutional: Negative.  Negative for chills, fever and malaise/fatigue.  HENT: Negative.  Negative for ear discharge, ear pain, hearing loss, nosebleeds and sore throat.   Eyes: Positive for blurred vision. Negative for pain.  Respiratory: Negative.  Negative for cough, hemoptysis, shortness of breath and wheezing.   Cardiovascular: Negative.  Negative for chest pain, palpitations and leg swelling.  Gastrointestinal: Negative.  Negative for abdominal pain, blood in stool, diarrhea, nausea and vomiting.  Genitourinary: Negative.  Negative for dysuria.  Musculoskeletal: Negative.  Negative for back pain.  Skin: Negative.   Neurological: Negative for dizziness, tremors, speech change, focal weakness, seizures and headaches.  Endo/Heme/Allergies: Negative.  Does not bruise/bleed easily.  Psychiatric/Behavioral: Negative.  Negative for depression, hallucinations and suicidal ideas.    Tolerating Diet: yes      DRUG ALLERGIES:  No Known Allergies  VITALS:  Blood pressure 117/64, pulse 88, temperature 98.6 F (37 C), temperature source Oral, resp. rate 20, height '5\' 11"'$  (1.803 m), weight 77.8 kg (171 lb 9.6 oz), SpO2 94 %.  PHYSICAL EXAMINATION:   Physical Exam  Constitutional: He is oriented to person, place, and time and well-developed, well-nourished, and in no distress. No distress.  HENT:  Head: Normocephalic.  Eyes: No scleral icterus.  Neck: Normal range of motion. Neck supple. No JVD present. No tracheal deviation present.  Cardiovascular: Normal rate, regular rhythm and normal heart sounds.  Exam reveals no gallop and no friction rub.   No murmur heard. Pulmonary/Chest: Effort normal and breath  sounds normal. No respiratory distress. He has no wheezes. He has no rales. He exhibits no tenderness.  Abdominal: Soft. Bowel sounds are normal. He exhibits no distension and no mass. There is no tenderness. There is no rebound and no guarding.  Musculoskeletal: Normal range of motion. He exhibits no edema.  Neurological: He is alert and oriented to person, place, and time.  Skin: Skin is warm. No rash noted. No erythema.  Psychiatric: Affect and judgment normal.      LABORATORY PANEL:   CBC  Recent Labs Lab 12/15/16 1554  WBC 12.0*  HGB 15.0  HCT 44.7  PLT 237   ------------------------------------------------------------------------------------------------------------------  Chemistries   Recent Labs Lab 12/15/16 1554  NA 137  K 3.9  CL 104  CO2 27  GLUCOSE 108*  BUN 23*  CREATININE 1.02  CALCIUM 8.8*  AST 19  ALT 19  ALKPHOS 61  BILITOT 0.9   ------------------------------------------------------------------------------------------------------------------  Cardiac Enzymes  Recent Labs Lab 12/15/16 1554  TROPONINI <0.03   ------------------------------------------------------------------------------------------------------------------  RADIOLOGY:  Ct Angio Head W Or Wo Contrast  Result Date: 12/15/2016 CLINICAL DATA:  Lightheaded 1.5 hours prior to arrival. Code stroke from earlier. EXAM: CT ANGIOGRAPHY HEAD AND NECK TECHNIQUE: Multidetector CT imaging of the head and neck was performed using the standard protocol during bolus administration of intravenous contrast. Multiplanar CT image reconstructions and MIPs were obtained to evaluate the vascular anatomy. Carotid stenosis measurements (when applicable) are obtained utilizing NASCET criteria, using the distal internal carotid diameter as the denominator. CONTRAST:  Isovue 370, 80 mL. COMPARISON:  CT head earlier today. Multiple prior cross-sectional imaging studies including previous MR from 05/16/2012  FINDINGS: CTA NECK Aortic  arch: Standard branching. Imaged portion shows no evidence of aneurysm or dissection. No significant stenosis of the major arch vessel origins. Mild atheromatous change transverse arch. Right carotid system: Mild atheromatous change at the bifurcation. No evidence of dissection, stenosis (50% or greater) or occlusion. Left carotid system: Mild atheromatous change at the bifurcation. No evidence of dissection, stenosis (50% or greater) or occlusion. Vertebral arteries: Codominant. No evidence of dissection, stenosis (50% or greater) or occlusion. Mild calcific plaque adjacent to the LEFT vertebral origin, but no narrowing. Nonvascular soft tissues: Severe COPD with biapical bullous change, compressive atelectasis in the RIGHT upper lobe. No definite nodule or adenopathy. Spondylosis. No neck masses. CTA HEAD Anterior circulation: The internal carotid arteries are widely patent. There is no significant disease involving the anterior or middle cerebral arteries. No significant stenosis, proximal occlusion, aneurysm, or vascular malformation. Posterior circulation: Both vertebral arteries contribute to basilar formation. No significant stenosis, proximal occlusion, aneurysm, or vascular malformation. Venous sinuses: As permitted by contrast timing, patent. Anatomic variants: None of significance. Delayed phase:  Not performed. Review of the MIP images confirms the above findings IMPRESSION: Unremarkable CTA extracranial and intracranial circulation. No flow-limiting stenosis, dissection, significant ulceration, or significant intracranial disease. Electronically Signed   By: Staci Righter M.D.   On: 12/15/2016 17:51   Ct Angio Neck W And/or Wo Contrast  Result Date: 12/15/2016 CLINICAL DATA:  Lightheaded 1.5 hours prior to arrival. Code stroke from earlier. EXAM: CT ANGIOGRAPHY HEAD AND NECK TECHNIQUE: Multidetector CT imaging of the head and neck was performed using the standard protocol  during bolus administration of intravenous contrast. Multiplanar CT image reconstructions and MIPs were obtained to evaluate the vascular anatomy. Carotid stenosis measurements (when applicable) are obtained utilizing NASCET criteria, using the distal internal carotid diameter as the denominator. CONTRAST:  Isovue 370, 80 mL. COMPARISON:  CT head earlier today. Multiple prior cross-sectional imaging studies including previous MR from 05/16/2012 FINDINGS: CTA NECK Aortic arch: Standard branching. Imaged portion shows no evidence of aneurysm or dissection. No significant stenosis of the major arch vessel origins. Mild atheromatous change transverse arch. Right carotid system: Mild atheromatous change at the bifurcation. No evidence of dissection, stenosis (50% or greater) or occlusion. Left carotid system: Mild atheromatous change at the bifurcation. No evidence of dissection, stenosis (50% or greater) or occlusion. Vertebral arteries: Codominant. No evidence of dissection, stenosis (50% or greater) or occlusion. Mild calcific plaque adjacent to the LEFT vertebral origin, but no narrowing. Nonvascular soft tissues: Severe COPD with biapical bullous change, compressive atelectasis in the RIGHT upper lobe. No definite nodule or adenopathy. Spondylosis. No neck masses. CTA HEAD Anterior circulation: The internal carotid arteries are widely patent. There is no significant disease involving the anterior or middle cerebral arteries. No significant stenosis, proximal occlusion, aneurysm, or vascular malformation. Posterior circulation: Both vertebral arteries contribute to basilar formation. No significant stenosis, proximal occlusion, aneurysm, or vascular malformation. Venous sinuses: As permitted by contrast timing, patent. Anatomic variants: None of significance. Delayed phase:  Not performed. Review of the MIP images confirms the above findings IMPRESSION: Unremarkable CTA extracranial and intracranial circulation. No  flow-limiting stenosis, dissection, significant ulceration, or significant intracranial disease. Electronically Signed   By: Staci Righter M.D.   On: 12/15/2016 17:51   Ct Head Code Stroke W/o Cm  Result Date: 12/15/2016 CLINICAL DATA:  Code stroke.  Ataxia left face droop since 2:30. EXAM: CT HEAD WITHOUT CONTRAST TECHNIQUE: Contiguous axial images were obtained from the base of the skull through the  vertex without intravenous contrast. COMPARISON:  05/24/2012 FINDINGS: Brain: No evidence of acute infarction, hemorrhage, hydrocephalus, extra-axial collection or mass lesion/mass effect. Normal appearance for age. Vascular: No hyperdense vessel or unexpected calcification. Skull: Unremarkable Sinuses/Orbits: No acute finding. Other: These results were called by telephone at the time of interpretation on 12/15/2016 at 4:26 pm to Dr. Harvest Dark , who verbally acknowledged these results. ASPECTS [facility] Stroke Program Early CT Score) - Ganglionic level infarction (caudate, lentiform nuclei, internal capsule, insula, M1-M3 cortex): 7 - Supraganglionic infarction (M4-M6 cortex): 3 Total score (0-10 with 10 being normal): 10 IMPRESSION: No acute finding. ASPECTS is 10. Electronically Signed   By: Monte Fantasia M.D.   On: 12/15/2016 16:29     ASSESSMENT AND PLAN:   83 year old male with a history of COPD presented with dizziness and unsteady gait.  2. Dizziness with unsteady gait: Patient is being evaluated for CVA  Follow up an MRI and echocardiogram Neurology consultation pending Await PT consult Continue aspirin and statin LDL 108 2. COPD without signs of exacerbation: Continue inhalers   3. Diabetes: Continue sliding scale insulin  4. Essential hypertension: Continue lisinopril  Management plans discussed with the patient and he is in agreement.  CODE STATUS: full  TOTAL TIME TAKING CARE OF THIS PATIENT: 30 minutes.     POSSIBLE D/C today, DEPENDING ON CLINICAL  CONDITION.   Hinton Luellen M.D on 12/16/2016 at 9:32 AM  Between 7am to 6pm - Pager - [phone] After 6pm go to [email or web address] - password [email or web address] [facility]  Office  [phone]  CC: Primary care physician; Pcp Not In System  Note: This dictation was prepared with Dragon dictation along with smaller phrase technology. Any transcriptional errors that result from this process are unintentional.

## 2016-12-16 NOTE — Evaluation (Signed)
Physical Therapy Evaluation Patient Details Name: [name] [name] MRN: [id number] DOB: Feb 04, 1934 Today's Date: 12/16/2016   History of Present Illness  [name] [name]  is a 83 y.o. male with a known history of anxiety, asthma, COPD, depression, diabetes type 2, GERD, hyper-tension and hyperlipidemia will states that earlier he was driving when all of a sudden he started having blurred vision, dizziness and difficulty walking. He states that he felt very exhausted. The patient does not have any focal weakness, no numbness or tingling. No prior history of CVA or TIA. At time of PT evaluation pt reports complete resolution of dizziness but persistent blurred vision.   Clinical Impression  Pt admitted with above diagnosis. Pt currently with functional limitations due to the deficits listed below (see PT Problem List).  At time of PT evaluation pt reports resolution of dizziness but persistent blurred vision bilaterally, worse in R eye. No visual field cut. No focal weakness or numbness/tingling. Ocular alignment normal, no nystagmus, and smooth visual pursuit is normal. Negative pronator drift and finger to nose is normal. Pt is independent with bed mobility and transfers. He is able to ambulate in the hallway without an assistive device. With horizontal and vertical head turns he suffers moderate to severe staggering left and right. He is able to self-correct without full loss of balance but is very unsteady. He is able to increase/decrease gait speed without notable deficits but with pivot turns he staggers and has to take multiple steps to recover. SaO2>90% on room air during ambulation. He would benefit from use of walker at discharge for added safety and reduce fall risk. At a minimum he should be using a single point cane. He would also benefit from [facility] PT to work on balance but pt is unclear whether he is in agreement. Pt will benefit from skilled PT services to address deficits in strength, balance, and  mobility in order to return to full function at home.    Follow Up Recommendations Home health PT;Other (comment) (Pt unclear if he will agree to [facility] PT or not)    Equipment Recommendations  Rolling walker with 5" wheels;3in1 (PT);Other (comment) (3 in 1 as shower seat, pt unclear if he wants 3 in 1 or not)    Recommendations for Other Services       Precautions / Restrictions Precautions Precautions: Fall Restrictions Weight Bearing Restrictions: No      Mobility  Bed Mobility Overal bed mobility: Needs Assistance Bed Mobility: Sit to Supine       Sit to supine: Independent   General bed mobility comments: good speed/sequencing  Transfers Overall transfer level: Independent Equipment used: None             General transfer comment: Fair speed/sequencing with transfers without UE support. Speed is appropriate  Ambulation/Gait Ambulation/Gait assistance: Min guard Ambulation Distance (Feet): 120 Feet Assistive device: None   Gait velocity: WFL   General Gait Details: Pt ambulates in hallway without an assistive device. With horizontal and vertical head turns he suffers moderate to severe staggering left and right. He is able to self-correct without full loss of balance but is very unsteady. He is able to increase/decrease gait speed without notable deficits but with pivot turns he staggers and has to take multiple steps to recover. SaO2>90% on room air during ambulation  Stairs            Wheelchair Mobility    Modified Rankin (Stroke Patients Only)       Balance  Overall balance assessment: Needs assistance Sitting-balance support: No upper extremity supported Sitting balance-Leahy Scale: Good     Standing balance support: No upper extremity supported Standing balance-Leahy Scale: Fair Standing balance comment: Negative Rhomberg. Unable to maintain tandem or single leg stance for greater than 1-2 seconds                              Pertinent Vitals/Pain Pain Assessment: No/denies pain    Home Living Family/patient expects to be discharged to:: Private residence Living Arrangements: Alone Available Help at Discharge: Family;Other (Comment) (Son lives within driving distance) Type of Home: Other(Comment) (Condo) Home Access: Level entry     Home Layout: One level Home Equipment: None      Prior Function Level of Independence: Independent         Comments: Independent with ADLs/IADLs. Drives. Pt was driving back from [place] yesterday when his symptoms began     Hand Dominance   Dominant Hand: Right    Extremity/Trunk Assessment   Upper Extremity Assessment Upper Extremity Assessment: RUE deficits/detail RUE Deficits / Details: Chronic RUE weakness and pain with shoulder flexion. Otherwise bilatearal UE strength grossly WFL without focal weakness. Finger to nose normal. Pronator drift negative    Lower Extremity Assessment Lower Extremity Assessment: Overall WFL for tasks assessed       Communication   Communication: No difficulties  Cognition Arousal/Alertness: Awake/alert Behavior During Therapy: WFL for tasks assessed/performed Overall Cognitive Status: Within Functional Limits for tasks assessed                                        General Comments      Exercises     Assessment/Plan    PT Assessment Patient needs continued PT services  PT Problem List Decreased strength;Decreased balance;Decreased mobility;Decreased safety awareness       PT Treatment Interventions DME instruction;Gait training;Therapeutic activities;Therapeutic exercise;Balance training;Neuromuscular re-education;Patient/family education    PT Goals (Current goals can be found in the Care Plan section)  Acute Rehab PT Goals Patient Stated Goal: Return to prior level of function PT Goal Formulation: With patient Time For Goal Achievement: 12/30/16 Potential to Achieve Goals: Good     Frequency Min 2X/week   Barriers to discharge Decreased caregiver support      Co-evaluation               End of Session Equipment Utilized During Treatment: Gait belt Activity Tolerance: Patient tolerated treatment well Patient left: in bed;with call bell/phone within reach;with bed alarm set;Other (comment) (SLP student with patient) Nurse Communication: Mobility status PT Visit Diagnosis: Unsteadiness on feet (R26.81)    Time: 5465-0354 PT Time Calculation (min) (ACUTE ONLY): 22 min   Charges:   PT Evaluation $PT Eval Low Complexity: 1 Procedure     PT G Codes:   PT G-Codes **NOT FOR INPATIENT CLASS** Functional Assessment Tool Used: AM-PAC 6 Clicks Basic Mobility Functional Limitation: Mobility: Walking and moving around Mobility: Walking and Moving Around Current Status (S5681): At least 1 percent but less than 20 percent impaired, limited or restricted Mobility: Walking and Moving Around Goal Status [phone]): 0 percent impaired, limited or restricted    Phillips Grout PT, DPT    Fread Kottke 12/16/2016, 12:11 PM

## 2016-12-16 NOTE — Progress Notes (Addendum)
SLP Cancellation Note  Patient Details Name: [name] [name] MRN: [id number] DOB: 1934-02-12   Cancelled treatment:       Reason Eval/Treat Not Completed: Patient at procedure or test/unavailable (Consulted NSG). Pt out of room for test, consulted nursing aid who noted she helped pt to restroom this morning with no notes of speech/language difficulty. ST services with f/u later today when pt returns to room.   Update: Pt denied any trouble with speech/language, pt able to converse at conversational level with SLP and NSG. Pt able to answer open ended questions and was able to pick dinner meal from menu. SLP assisted pt with ordering meal. No further skilled ST services indicated, NSG to re-consult if any change in status.  Addendum: pt and NSG denied any difficulty swallowing; pt is on a regular diet consistency per MD order. Noted MRI results: No acute finding.   Eulogio Ditch, B.S Graduate Clinician  12/16/2016, 9:45 AM    This information has been reviewed and agreed upon by this supervising clinician.  [name] [name], [place], CCC-SLP

## 2016-12-16 NOTE — Progress Notes (Signed)
*  PRELIMINARY RESULTS* Echocardiogram 2D Echocardiogram has been performed.  [name] [name] 12/16/2016, 12:40 PM

## 2016-12-16 NOTE — Care Management (Signed)
Patient admitted with dizziness and unsteady gait.  Patient states that he was travelling from [place] to [facility] which is where his home is.  At baseline he is independent of all ADL's.  PT has assessed patient and recommended home health PT.  Patient has declined home health PT and BSC, but was agreeable to walker.  RW to be delivered to room by Corene Cornea from [facility] prior to discharge.  Patient states that when he is discharge he plans to continue his drive to his home in [place] [place] [facility].  Patient has 2 sons that live local to him in [place].  Patient states that his car is at a local rest stop, but is not sure which one.  Patient states that he will not have any means to get to his car when discharged.  Bedside nurse awaiting communication from MD on discharge disposition.

## 2016-12-16 NOTE — Progress Notes (Addendum)
Pt for discharge home at this time. Alert. No resp distress. Pt will be  Taken to his vehicle at  Rest area in [place] by taxi.  Sl d/cd.  Discharge instructions discussed with pt. Home meds discussed.  Diet activity and f/u discussed. Ready for discharge .

## 2016-12-16 NOTE — Consult Note (Signed)
Referring Physician: Mody    Chief Complaint: Blurred vision and difficulty with gait  HPI: [name] [name] is an 83 y.o. male who reports that he was driving and had the acute onset of blurred vision.  Began to feel as if he was going to pass out as well.  Pulled over at a rest stop and when he got out was walking as if he was drunk.  Had nausea and vomited once.  EMS was called and the patient was brought in for evaluation.  Initial NIHSS of 0.    Date last known well: Date: 12/15/2016 Time last known well: Time: 14:30 tPA Given: No: Improving symptoms  Past Medical History:  Diagnosis Date  . ABSCESS 12/03/2009  . ABSCESS, FINGER 04/07/2010  . ANXIETY 11/03/2009  . ASTHMA 11/03/2009  . CHRONIC OBSTRUCTIVE PULMONARY DISEASE, ACUTE EXACERBATION 11/03/2009  . COPD 11/03/2009  . DEPRESSION 11/03/2009  . DIABETES MELLITUS, TYPE II 11/03/2009  . [place] DISEASE, LUMBAR 11/03/2009  . EMPHYSEMA, BULLOUS 11/03/2009  . GERD 11/03/2009  . HYPERLIPIDEMIA 11/03/2009  . HYPERTENSION 11/03/2009  . Kidney stones 01/30/12   "I've had them 7 times; always have passed them"  . PEPTIC ULCER DISEASE 11/03/2009  . Pneumonia   . RASH-NONVESICULAR 11/03/2009  . RESTLESS LEG SYNDROME 11/03/2009  . Shortness of breath    "sometimes; at any time"  . SPINAL STENOSIS, LUMBAR 11/03/2009    Past Surgical History:  Procedure Laterality Date  . [place]   left  . INGUINAL HERNIA REPAIR  10/2011   left  . ROTATOR CUFF REPAIR  2003   left  . TONSILLECTOMY  1960    Family History  Problem Relation Age of Onset  . Heart disease Father   . Heart disease Mother   . Cancer Brother     lung   Social History:  reports that he has been smoking Cigarettes.  He has a 41.00 pack-year smoking history. He has never used smokeless tobacco. He reports that he does not drink alcohol or use drugs.  Allergies: No Known Allergies  Medications:  I have reviewed the patient's current medications. Prior to  Admission:  Prescriptions Prior to Admission  Medication Sig Dispense Refill Last Dose  . albuterol (PROVENTIL HFA;VENTOLIN HFA) 108 (90 Base) MCG/ACT inhaler Inhale 1-2 puffs into the lungs every 6 (six) hours as needed for wheezing or shortness of breath.    Unknown at Unknown  . aspirin EC 81 MG tablet Take 81 mg by mouth daily.   Unknown at Unknown  . Fluticasone-Salmeterol (ADVAIR) 100-50 MCG/DOSE AEPB Inhale 1 puff into the lungs 2 (two) times daily.   Unknown at Unknown  . lisinopril (PRINIVIL,ZESTRIL) 10 MG tablet Take 10 mg by mouth daily.   Unknown at Unknown  . predniSONE (STERAPRED UNI-PAK 21 TAB) 10 MG (21) TBPK tablet Take 6 tabs by mouth daily  for 2 days, then 5 tabs for 2 days, then 4 tabs for 2 days, then 3 tabs for 2 days, 2 tabs for 2 days, then 1 tab by mouth daily for 2 days 42 tablet 0 Unknown at Unknown   Scheduled: . aspirin EC  81 mg Oral Daily  . enoxaparin (LOVENOX) injection  40 mg Subcutaneous Q24H  . insulin aspart  0-9 Units Subcutaneous TID WC  . lisinopril  10 mg Oral Daily  . mometasone-formoterol  2 puff Inhalation BID    ROS: History obtained from the patient  General ROS: negative for - chills,  fatigue, fever, night sweats, weight gain or weight loss Psychological ROS: negative for - behavioral disorder, hallucinations, memory difficulties, mood swings or suicidal ideation Ophthalmic ROS: negative for - blurry vision, double vision, eye pain or loss of vision ENT ROS: negative for - epistaxis, nasal discharge, oral lesions, sore throat, tinnitus or vertigo Allergy and Immunology ROS: negative for - hives or itchy/watery eyes Hematological and Lymphatic ROS: negative for - bleeding problems, bruising or swollen lymph nodes Endocrine ROS: negative for - galactorrhea, hair pattern changes, polydipsia/polyuria or temperature intolerance Respiratory ROS: recent difficulty with shortness of breath Cardiovascular ROS: negative for - chest pain, dyspnea on  exertion, edema or irregular heartbeat Gastrointestinal ROS: negative for - abdominal pain, diarrhea, hematemesis, nausea/vomiting or stool incontinence Genito-Urinary ROS: negative for - dysuria, hematuria, incontinence or urinary frequency/urgency Musculoskeletal ROS: right shoulder pain Neurological ROS: as noted in HPI Dermatological ROS: negative for rash and skin lesion changes  Physical Examination: Blood pressure 117/64, pulse 88, temperature 98.6 F (37 C), temperature source Oral, resp. rate 20, height '5\' 11"'$  (1.803 m), weight 77.8 kg (171 lb 9.6 oz), SpO2 94 %.  HEENT-  Normocephalic, no lesions, without obvious abnormality.  Normal external eye and conjunctiva.  Normal TM's bilaterally.  Normal auditory canals and external ears. Normal external nose, mucus membranes and septum.  Normal pharynx. Cardiovascular- S1, S2 normal, pulses palpable throughout   Lungs- chest clear, no wheezing, rales, normal symmetric air entry Abdomen- soft, non-tender; bowel sounds normal; no masses,  no organomegaly Extremities- no edema Lymph-no adenopathy palpable Musculoskeletal-no joint tenderness, deformity or swelling Skin-warm and dry, no hyperpigmentation, vitiligo, or suspicious lesions  Neurological Examination   Mental Status: Alert, oriented, thought content appropriate.  Speech fluent without evidence of aphasia.  Able to follow 3 step commands without difficulty. Cranial Nerves: II: Discs flat bilaterally; Visual fields grossly normal but patient reports blurred vision from the right eye greater than the left eye, pupils equal, round, reactive to light and accommodation III,IV, VI: ptosis not present, extra-ocular motions intact bilaterally V,VII: smile symmetric, facial light touch sensation normal bilaterally VIII: hearing normal bilaterally IX,X: gag reflex present XI: bilateral shoulder shrug XII: midline tongue extension Motor: Right : Upper extremity   5/5    Left:     Upper  extremity   5/5  Lower extremity   5/5     Lower extremity   5/5 Tone and bulk:normal tone throughout; no atrophy noted Sensory: Pinprick and light touch intact throughout, bilaterally Deep Tendon Reflexes: 2+ and symmetric throughout Plantars: Right: mute   Left: mute Cerebellar: Normal finger-to-nose and normal heel-to-shin testing bilaterally Gait: not tested due to safety concerns    Laboratory Studies:  Basic Metabolic Panel:  Recent Labs Lab 12/15/16 1554  NA 137  K 3.9  CL 104  CO2 27  GLUCOSE 108*  BUN 23*  CREATININE 1.02  CALCIUM 8.8*    Liver Function Tests:  Recent Labs Lab 12/15/16 1554  AST 19  ALT 19  ALKPHOS 61  BILITOT 0.9  PROT 6.5  ALBUMIN 3.6   No results for input(s): LIPASE, AMYLASE in the last 168 hours. No results for input(s): AMMONIA in the last 168 hours.  CBC:  Recent Labs Lab 12/15/16 1554  WBC 12.0*  NEUTROABS 8.9*  HGB 15.0  HCT 44.7  MCV 91.2  PLT 237    Cardiac Enzymes:  Recent Labs Lab 12/15/16 1554  TROPONINI <0.03    BNP: Invalid input(s): POCBNP  CBG:  Recent Labs  Lab 12/15/16 1628 12/15/16 1915 12/15/16 2100 12/16/16 0716 12/16/16 1144  GLUCAP 89 112* 151* 95 73    Microbiology: Results for orders placed or performed in visit on 04/30/12  Urine culture     Status: None   Collection Time: 04/28/12  8:00 PM  Result Value Ref Range Status   Micro Text Report   Final       SOURCE: CLEAN CATCH    ORGANISM 1                2000 CFU/ML GRAM POSITIVE COCCI   ANTIBIOTIC                                                        Coagulation Studies:  Recent Labs  12/15/16 1611  LABPROT 12.5  INR 0.93    Urinalysis:  Recent Labs Lab 12/15/16 1910  COLORURINE YELLOW*  LABSPEC >1.046*  PHURINE 7.0  GLUCOSEU NEGATIVE  HGBUR NEGATIVE  BILIRUBINUR NEGATIVE  KETONESUR NEGATIVE  PROTEINUR NEGATIVE  NITRITE NEGATIVE  LEUKOCYTESUR NEGATIVE    Lipid Panel:    Component Value  Date/Time   CHOL 189 12/16/2016 0413   CHOL 176 04/29/2012 0409   TRIG 132 12/16/2016 0413   TRIG 231 (H) 04/29/2012 0409   HDL 55 12/16/2016 0413   HDL 40 04/29/2012 0409   CHOLHDL 3.4 12/16/2016 0413   VLDL 26 12/16/2016 0413   VLDL 46 (H) 04/29/2012 0409   LDLCALC 108 (H) 12/16/2016 0413   LDLCALC 90 04/29/2012 0409    HgbA1C:  Lab Results  Component Value Date   HGBA1C 5.6 04/29/2012    Urine Drug Screen:     Component Value Date/Time   LABOPIA NONE DETECTED 12/15/2016 1910   LABOPIA NONE DETECTED 03/01/2012 1531   COCAINSCRNUR NONE DETECTED 12/15/2016 1910   LABBENZ NONE DETECTED 12/15/2016 1910   LABBENZ NONE DETECTED 03/01/2012 1531   AMPHETMU NONE DETECTED 12/15/2016 1910   AMPHETMU NONE DETECTED 03/01/2012 1531   THCU NONE DETECTED 12/15/2016 1910   THCU NONE DETECTED 03/01/2012 1531   LABBARB POSITIVE (A) 12/15/2016 1910   LABBARB NONE DETECTED 03/01/2012 1531    Alcohol Level:  Recent Labs Lab 12/15/16 1625  ETH <5    Other results: EKG: normal sinus rhythm at 99 bpm.  Imaging: Ct Angio Head W Or Wo Contrast  Result Date: 12/15/2016 CLINICAL DATA:  Lightheaded 1.5 hours prior to arrival. Code stroke from earlier. EXAM: CT ANGIOGRAPHY HEAD AND NECK TECHNIQUE: Multidetector CT imaging of the head and neck was performed using the standard protocol during bolus administration of intravenous contrast. Multiplanar CT image reconstructions and MIPs were obtained to evaluate the vascular anatomy. Carotid stenosis measurements (when applicable) are obtained utilizing NASCET criteria, using the distal internal carotid diameter as the denominator. CONTRAST:  Isovue 370, 80 mL. COMPARISON:  CT head earlier today. Multiple prior cross-sectional imaging studies including previous MR from 05/16/2012 FINDINGS: CTA NECK Aortic arch: Standard branching. Imaged portion shows no evidence of aneurysm or dissection. No significant stenosis of the major arch vessel origins. Mild  atheromatous change transverse arch. Right carotid system: Mild atheromatous change at the bifurcation. No evidence of dissection, stenosis (50% or greater) or occlusion. Left carotid system: Mild atheromatous change at the bifurcation. No evidence of dissection, stenosis (50% or greater) or occlusion. Vertebral arteries:  Codominant. No evidence of dissection, stenosis (50% or greater) or occlusion. Mild calcific plaque adjacent to the LEFT vertebral origin, but no narrowing. Nonvascular soft tissues: Severe COPD with biapical bullous change, compressive atelectasis in the RIGHT upper lobe. No definite nodule or adenopathy. Spondylosis. No neck masses. CTA HEAD Anterior circulation: The internal carotid arteries are widely patent. There is no significant disease involving the anterior or middle cerebral arteries. No significant stenosis, proximal occlusion, aneurysm, or vascular malformation. Posterior circulation: Both vertebral arteries contribute to basilar formation. No significant stenosis, proximal occlusion, aneurysm, or vascular malformation. Venous sinuses: As permitted by contrast timing, patent. Anatomic variants: None of significance. Delayed phase:  Not performed. Review of the MIP images confirms the above findings IMPRESSION: Unremarkable CTA extracranial and intracranial circulation. No flow-limiting stenosis, dissection, significant ulceration, or significant intracranial disease. Electronically Signed   By: Staci Righter M.D.   On: 12/15/2016 17:51   Ct Angio Neck W And/or Wo Contrast  Result Date: 12/15/2016 CLINICAL DATA:  Lightheaded 1.5 hours prior to arrival. Code stroke from earlier. EXAM: CT ANGIOGRAPHY HEAD AND NECK TECHNIQUE: Multidetector CT imaging of the head and neck was performed using the standard protocol during bolus administration of intravenous contrast. Multiplanar CT image reconstructions and MIPs were obtained to evaluate the vascular anatomy. Carotid stenosis measurements  (when applicable) are obtained utilizing NASCET criteria, using the distal internal carotid diameter as the denominator. CONTRAST:  Isovue 370, 80 mL. COMPARISON:  CT head earlier today. Multiple prior cross-sectional imaging studies including previous MR from 05/16/2012 FINDINGS: CTA NECK Aortic arch: Standard branching. Imaged portion shows no evidence of aneurysm or dissection. No significant stenosis of the major arch vessel origins. Mild atheromatous change transverse arch. Right carotid system: Mild atheromatous change at the bifurcation. No evidence of dissection, stenosis (50% or greater) or occlusion. Left carotid system: Mild atheromatous change at the bifurcation. No evidence of dissection, stenosis (50% or greater) or occlusion. Vertebral arteries: Codominant. No evidence of dissection, stenosis (50% or greater) or occlusion. Mild calcific plaque adjacent to the LEFT vertebral origin, but no narrowing. Nonvascular soft tissues: Severe COPD with biapical bullous change, compressive atelectasis in the RIGHT upper lobe. No definite nodule or adenopathy. Spondylosis. No neck masses. CTA HEAD Anterior circulation: The internal carotid arteries are widely patent. There is no significant disease involving the anterior or middle cerebral arteries. No significant stenosis, proximal occlusion, aneurysm, or vascular malformation. Posterior circulation: Both vertebral arteries contribute to basilar formation. No significant stenosis, proximal occlusion, aneurysm, or vascular malformation. Venous sinuses: As permitted by contrast timing, patent. Anatomic variants: None of significance. Delayed phase:  Not performed. Review of the MIP images confirms the above findings IMPRESSION: Unremarkable CTA extracranial and intracranial circulation. No flow-limiting stenosis, dissection, significant ulceration, or significant intracranial disease. Electronically Signed   By: Staci Righter M.D.   On: 12/15/2016 17:51   Mr  Brain Wo Contrast  Result Date: 12/16/2016 CLINICAL DATA:  Acute presentation with blurred vision, dizziness and gait disturbance. EXAM: MRI HEAD WITHOUT CONTRAST TECHNIQUE: Multiplanar, multiecho pulse sequences of the brain and surrounding structures were obtained without intravenous contrast. COMPARISON:  CT 12/15/2016.  MRI 05/22/2012. FINDINGS: Brain: Diffusion imaging does not show any acute or subacute infarction. There are mild chronic small-vessel ischemic changes of the pons. No focal cerebellar insult. Old left thalamic lacunar infarction, not present in 2013. Cerebral hemispheres show mild chronic small-vessel change of the deep and subcortical white matter. No cortical or large vessel territory infarction. Vascular: Major vessels  at the base of the brain show flow. Skull and upper cervical spine: Negative Sinuses/Orbits: Clear/normal Other: None significant IMPRESSION: No acute finding. No cause of the presenting symptoms is identified. Chronic small-vessel ischemic changes of the pons and hemispheric white matter. Old left thalamic lacunar infarction. Electronically Signed   By: Nelson Chimes M.D.   On: 12/16/2016 10:35   Ct Head Code Stroke W/o Cm  Result Date: 12/15/2016 CLINICAL DATA:  Code stroke.  Ataxia left face droop since 2:30. EXAM: CT HEAD WITHOUT CONTRAST TECHNIQUE: Contiguous axial images were obtained from the base of the skull through the vertex without intravenous contrast. COMPARISON:  05/24/2012 FINDINGS: Brain: No evidence of acute infarction, hemorrhage, hydrocephalus, extra-axial collection or mass lesion/mass effect. Normal appearance for age. Vascular: No hyperdense vessel or unexpected calcification. Skull: Unremarkable Sinuses/Orbits: No acute finding. Other: These results were called by telephone at the time of interpretation on 12/15/2016 at 4:26 pm to Dr. Harvest Dark , who verbally acknowledged these results. ASPECTS [facility] Stroke Program Early CT Score) -  Ganglionic level infarction (caudate, lentiform nuclei, internal capsule, insula, M1-M3 cortex): 7 - Supraganglionic infarction (M4-M6 cortex): 3 Total score (0-10 with 10 being normal): 10 IMPRESSION: No acute finding. ASPECTS is 10. Electronically Signed   By: Monte Fantasia M.D.   On: 12/15/2016 16:29    Assessment: 83 y.o. male presenting with complaint of blurred vision and difficulty with gait.  Although symptoms improved patient is not back to baseline.  MRI of the brain reviewed and shows no acute changes.  CTA unremarkable.  Carotid dopplers show no evidence of hemodynamically significant stenosis.  Echocardiogram pending.  A1c pending, LDL 108.  Patient on ASA at home.   Although work up has been unremarkable patient with multiple risk factors.  Can not rule out a MR negative infarct.  Vision not particularly consistent though and patient should be seen by an ophthalmologist.    Stroke Risk Factors - diabetes mellitus, hyperlipidemia and hypertension  Plan: 1. Prophylactic therapy-Antiplatelet med: Plavix - dose '75mg'$  daily 2. Telemetry monitoring 3. Frequent neuro checks 4. Ophthalmology consult 5. Statin for lipid management with target LDL<70.    Alexis Goodell, MD Neurology [phone] 12/16/2016, 12:57 PM

## 2016-12-17 LAB — HEMOGLOBIN A1C
Hgb A1c MFr Bld: 5.8 % — ABNORMAL HIGH (ref 4.8–5.6)
Mean Plasma Glucose: 120 mg/dL

## 2017-01-15 ENCOUNTER — Inpatient Hospital Stay
Admission: EM | Admit: 2017-01-15 | Discharge: 2017-01-17 | DRG: 917 | Disposition: A | Discharge status: Home / Self Care | Payer: Medicare (Managed Care) | Attending: Internal Medicine | Admitting: Internal Medicine

## 2017-01-15 ENCOUNTER — Emergency Department: Payer: Medicare (Managed Care)

## 2017-01-15 ENCOUNTER — Encounter: Payer: Self-pay | Admitting: Emergency Medicine

## 2017-01-15 DIAGNOSIS — I4891 Unspecified atrial fibrillation: Secondary | ICD-10-CM | POA: Diagnosis present

## 2017-01-15 DIAGNOSIS — Z8711 Personal history of peptic ulcer disease: Secondary | ICD-10-CM

## 2017-01-15 DIAGNOSIS — G2581 Restless legs syndrome: Secondary | ICD-10-CM | POA: Diagnosis present

## 2017-01-15 DIAGNOSIS — Y92009 Unspecified place in unspecified non-institutional (private) residence as the place of occurrence of the external cause: Secondary | ICD-10-CM

## 2017-01-15 DIAGNOSIS — F17210 Nicotine dependence, cigarettes, uncomplicated: Secondary | ICD-10-CM | POA: Diagnosis present

## 2017-01-15 DIAGNOSIS — Z87442 Personal history of urinary calculi: Secondary | ICD-10-CM | POA: Diagnosis not present

## 2017-01-15 DIAGNOSIS — K219 Gastro-esophageal reflux disease without esophagitis: Secondary | ICD-10-CM | POA: Diagnosis present

## 2017-01-15 DIAGNOSIS — J449 Chronic obstructive pulmonary disease, unspecified: Secondary | ICD-10-CM | POA: Diagnosis present

## 2017-01-15 DIAGNOSIS — E785 Hyperlipidemia, unspecified: Secondary | ICD-10-CM | POA: Diagnosis present

## 2017-01-15 DIAGNOSIS — I10 Essential (primary) hypertension: Secondary | ICD-10-CM | POA: Diagnosis present

## 2017-01-15 DIAGNOSIS — R4189 Other symptoms and signs involving cognitive functions and awareness: Secondary | ICD-10-CM

## 2017-01-15 DIAGNOSIS — Z79899 Other long term (current) drug therapy: Secondary | ICD-10-CM

## 2017-01-15 DIAGNOSIS — J189 Pneumonia, unspecified organism: Secondary | ICD-10-CM | POA: Diagnosis not present

## 2017-01-15 DIAGNOSIS — E119 Type 2 diabetes mellitus without complications: Secondary | ICD-10-CM | POA: Diagnosis present

## 2017-01-15 DIAGNOSIS — N390 Urinary tract infection, site not specified: Secondary | ICD-10-CM

## 2017-01-15 DIAGNOSIS — Z452 Encounter for adjustment and management of vascular access device: Secondary | ICD-10-CM

## 2017-01-15 DIAGNOSIS — G92 Toxic encephalopathy: Secondary | ICD-10-CM | POA: Diagnosis present

## 2017-01-15 DIAGNOSIS — Z8249 Family history of ischemic heart disease and other diseases of the circulatory system: Secondary | ICD-10-CM

## 2017-01-15 DIAGNOSIS — Z8673 Personal history of transient ischemic attack (TIA), and cerebral infarction without residual deficits: Secondary | ICD-10-CM | POA: Diagnosis not present

## 2017-01-15 DIAGNOSIS — J9600 Acute respiratory failure, unspecified whether with hypoxia or hypercapnia: Secondary | ICD-10-CM | POA: Diagnosis present

## 2017-01-15 DIAGNOSIS — T424X4D Poisoning by benzodiazepines, undetermined, subsequent encounter: Secondary | ICD-10-CM | POA: Diagnosis not present

## 2017-01-15 DIAGNOSIS — Z7982 Long term (current) use of aspirin: Secondary | ICD-10-CM | POA: Diagnosis not present

## 2017-01-15 DIAGNOSIS — T424X1A Poisoning by benzodiazepines, accidental (unintentional), initial encounter: Principal | ICD-10-CM

## 2017-01-15 DIAGNOSIS — J9690 Respiratory failure, unspecified, unspecified whether with hypoxia or hypercapnia: Secondary | ICD-10-CM | POA: Diagnosis present

## 2017-01-15 LAB — URINE DRUG SCREEN, QUALITATIVE (ARMC ONLY)
AMPHETAMINES, UR SCREEN: NOT DETECTED
Barbiturates, Ur Screen: NOT DETECTED
Benzodiazepine, Ur Scrn: POSITIVE — AB
COCAINE METABOLITE, UR ~~LOC~~: NOT DETECTED
Cannabinoid 50 Ng, Ur ~~LOC~~: NOT DETECTED
MDMA (ECSTASY) UR SCREEN: NOT DETECTED
Methadone Scn, Ur: NOT DETECTED
OPIATE, UR SCREEN: NOT DETECTED
PHENCYCLIDINE (PCP) UR S: NOT DETECTED
Tricyclic, Ur Screen: NOT DETECTED

## 2017-01-15 LAB — CBC
HCT: 50.3 % (ref 40.0–52.0)
Hemoglobin: 16.8 g/dL (ref 13.0–18.0)
MCH: 30.6 pg (ref 26.0–34.0)
MCHC: 33.5 g/dL (ref 32.0–36.0)
MCV: 91.5 fL (ref 80.0–100.0)
Platelets: 224 10*3/uL (ref 150–440)
RBC: 5.5 MIL/uL (ref 4.40–5.90)
RDW: 14.4 % (ref 11.5–14.5)
WBC: 11.7 10*3/uL — ABNORMAL HIGH (ref 3.8–10.6)

## 2017-01-15 LAB — COMPREHENSIVE METABOLIC PANEL
ALBUMIN: 3.3 g/dL — AB (ref 3.5–5.0)
ALT: 14 U/L — ABNORMAL LOW (ref 17–63)
ANION GAP: 7 (ref 5–15)
AST: 19 U/L (ref 15–41)
Alkaline Phosphatase: 65 U/L (ref 38–126)
BUN: 16 mg/dL (ref 6–20)
CO2: 24 mmol/L (ref 22–32)
Calcium: 8.2 mg/dL — ABNORMAL LOW (ref 8.9–10.3)
Chloride: 107 mmol/L (ref 101–111)
Creatinine, Ser: 0.97 mg/dL (ref 0.61–1.24)
GFR calc Af Amer: >60 mL/min (ref >60)
GFR calc non Af Amer: >60 mL/min (ref >60)
GLUCOSE: 86 mg/dL (ref 65–99)
POTASSIUM: 4.1 mmol/L (ref 3.5–5.1)
Sodium: 138 mmol/L (ref 135–145)
Total Bilirubin: 0.9 mg/dL (ref 0.3–1.2)
Total Protein: 6.2 g/dL — ABNORMAL LOW (ref 6.5–8.1)

## 2017-01-15 LAB — PROTIME-INR
INR: 0.93
Prothrombin Time: 12.5 seconds (ref 11.4–15.2)

## 2017-01-15 LAB — BLOOD GAS, ARTERIAL
Acid-Base Excess: 2.2 mmol/L — ABNORMAL HIGH (ref 0.0–2.0)
BICARBONATE: 27.8 mmol/L (ref 20.0–28.0)
FIO2: 0.5
MECHANICAL RATE: 20
MECHVT: 450 mL
O2 SAT: 98.2 %
PATIENT TEMPERATURE: 37
PCO2 ART: 46 mmHg (ref 32.0–48.0)
PEEP: 5 cmH2O
PO2 ART: 110 mmHg — AB (ref 83.0–108.0)
pH, Arterial: 7.39 (ref 7.350–7.450)

## 2017-01-15 LAB — DIFFERENTIAL
BASOS ABS: 0.1 10*3/uL (ref 0–0.1)
Basophils Relative: 1 %
EOS ABS: 0.2 10*3/uL (ref 0–0.7)
EOS PCT: 2 %
LYMPHS PCT: 22 %
Lymphs Abs: 2.5 10*3/uL (ref 1.0–3.6)
Monocytes Absolute: 0.6 10*3/uL (ref 0.2–1.0)
Monocytes Relative: 5 %
NEUTROS PCT: 70 %
Neutro Abs: 8.3 10*3/uL — ABNORMAL HIGH (ref 1.4–6.5)

## 2017-01-15 LAB — URINALYSIS, ROUTINE W REFLEX MICROSCOPIC
BILIRUBIN URINE: NEGATIVE
Glucose, UA: 50 mg/dL — AB
Hgb urine dipstick: NEGATIVE
KETONES UR: NEGATIVE mg/dL
LEUKOCYTES UA: NEGATIVE
Nitrite: POSITIVE — AB
PH: 6 (ref 5.0–8.0)
PROTEIN: NEGATIVE mg/dL
SQUAMOUS EPITHELIAL / LPF: NONE SEEN
Specific Gravity, Urine: 1.023 (ref 1.005–1.030)

## 2017-01-15 LAB — GLUCOSE, CAPILLARY
GLUCOSE-CAPILLARY: 95 mg/dL (ref 65–99)
Glucose-Capillary: 70 mg/dL (ref 65–99)

## 2017-01-15 LAB — ETHANOL

## 2017-01-15 LAB — APTT: aPTT: 24 seconds (ref 24–36)

## 2017-01-15 LAB — TROPONIN I

## 2017-01-15 MED ORDER — ONDANSETRON HCL 4 MG PO TABS: 4.0000 mg | ORAL_TABLET | Freq: Four times a day (QID) | ORAL | Status: DC | PRN

## 2017-01-15 MED ORDER — SODIUM CHLORIDE 0.9 % IV SOLN
INTRAVENOUS | Status: DC
  Administered 2017-01-15 – 2017-01-16 (×2): via INTRAVENOUS

## 2017-01-15 MED ORDER — SUCCINYLCHOLINE CHLORIDE 20 MG/ML IJ SOLN
INTRAMUSCULAR | Status: AC | PRN
  Administered 2017-01-15: 100 mg via INTRAVENOUS

## 2017-01-15 MED ORDER — PIPERACILLIN-TAZOBACTAM 3.375 G IVPB 30 MIN
3.3750 g | Freq: Once | INTRAVENOUS | Status: AC
  Administered 2017-01-16: 3.375 g via INTRAVENOUS
  Filled 2017-01-15: qty 50

## 2017-01-15 MED ORDER — SODIUM CHLORIDE 0.9 % IV SOLN
2.0000 mg/h | INTRAVENOUS | Status: DC
  Administered 2017-01-15: 2 mg/h via INTRAVENOUS
  Filled 2017-01-15: qty 10

## 2017-01-15 MED ORDER — PROPOFOL 1000 MG/100ML IV EMUL
INTRAVENOUS | Status: AC
  Administered 2017-01-15: 5 ug/kg/min via INTRAVENOUS
  Filled 2017-01-15: qty 100

## 2017-01-15 MED ORDER — ETOMIDATE 2 MG/ML IV SOLN
INTRAVENOUS | Status: AC | PRN
Start: 2017-01-15 — End: 2017-01-15
  Administered 2017-01-15: 10 mg via INTRAVENOUS

## 2017-01-15 MED ORDER — CEFTRIAXONE SODIUM-DEXTROSE 1-3.74 GM-% IV SOLR
INTRAVENOUS | Status: AC
  Administered 2017-01-15: 1 g via INTRAVENOUS
  Filled 2017-01-15: qty 50

## 2017-01-15 MED ORDER — ACETAMINOPHEN 325 MG PO TABS: 650.0000 mg | ORAL_TABLET | Freq: Four times a day (QID) | ORAL | Status: DC | PRN

## 2017-01-15 MED ORDER — DEXTROSE 5 % IV SOLN: 1.0000 g | Freq: Once | INTRAVENOUS | Status: DC

## 2017-01-15 MED ORDER — VANCOMYCIN HCL IN DEXTROSE 1-5 GM/200ML-% IV SOLN
1000.0000 mg | Freq: Once | INTRAVENOUS | Status: AC
  Administered 2017-01-16: 1000 mg via INTRAVENOUS
  Filled 2017-01-15: qty 200

## 2017-01-15 MED ORDER — CEFTRIAXONE SODIUM-DEXTROSE 1-3.74 GM-% IV SOLR
1.0000 g | Freq: Once | INTRAVENOUS | Status: AC
  Administered 2017-01-15: 1 g via INTRAVENOUS

## 2017-01-15 MED ORDER — ONDANSETRON HCL 4 MG/2ML IJ SOLN: 4.0000 mg | Freq: Four times a day (QID) | INTRAMUSCULAR | Status: DC | PRN

## 2017-01-15 MED ORDER — PROPOFOL 1000 MG/100ML IV EMUL
5.0000 ug/kg/min | Freq: Once | INTRAVENOUS | Status: AC
  Administered 2017-01-15: 5 ug/kg/min via INTRAVENOUS

## 2017-01-15 MED ORDER — IOPAMIDOL (ISOVUE-370) INJECTION 76%
75.0000 mL | Freq: Once | INTRAVENOUS | Status: AC | PRN
  Administered 2017-01-15: 75 mL via INTRAVENOUS

## 2017-01-15 MED ORDER — MIDAZOLAM HCL 2 MG/2ML IJ SOLN
2.0000 mg | Freq: Once | INTRAMUSCULAR | Status: AC
  Administered 2017-01-15: 2 mg via INTRAVENOUS

## 2017-01-15 MED ORDER — PANTOPRAZOLE SODIUM 40 MG PO PACK
40.0000 mg | PACK | ORAL | Status: DC
  Administered 2017-01-16: 40 mg
  Filled 2017-01-15: qty 20

## 2017-01-15 MED ORDER — ENOXAPARIN SODIUM 40 MG/0.4ML ~~LOC~~ SOLN
40.0000 mg | SUBCUTANEOUS | Status: DC
  Administered 2017-01-16 (×2): 40 mg via SUBCUTANEOUS
  Filled 2017-01-15 (×2): qty 0.4

## 2017-01-15 MED ORDER — ACETAMINOPHEN 650 MG RE SUPP: 650.0000 mg | Freq: Four times a day (QID) | RECTAL | Status: DC | PRN

## 2017-01-15 MED ORDER — PROPOFOL 1000 MG/100ML IV EMUL
5.0000 ug/kg/min | INTRAVENOUS | Status: DC
  Administered 2017-01-16: 35 ug/kg/min via INTRAVENOUS
  Filled 2017-01-15: qty 100

## 2017-01-15 NOTE — ED Notes (Signed)
Family for pt has arrived; patient relations rep Jonni Sanger with them in family waiting room;

## 2017-01-15 NOTE — H&P (Signed)
[facility] NAME: [name] [name]    MR#:  [id number]  DATE OF BIRTH:  [birth year]  DATE OF ADMISSION:  01/15/2017  PRIMARY CARE PHYSICIAN: Pcp Not In System   REQUESTING/REFERRING PHYSICIAN:   CHIEF COMPLAINT:  No chief complaint on file.   HISTORY OF PRESENT ILLNESS: [name] [name]  is a 83 y.o. male with a known history of COPD, type 2 diabetes mellitus, GERD, hyperlipidemia, hypertension, nephrolithiasis, peptic ulcer disease, restless leg syndrome, spinal stenosis was brought by the EMS for unresponsiveness . EMS was called by patient's niece because of decreased responsiveness. According to the family patient was confused around 7 PM in the evening and EMS was called. EMS found the patient to be in atrial fibrillation with decreased responsiveness. Upon arrival in the emergency room patient was intubated put on ventilator because he was not much responsive and very lethargic. Patient was worked up with a CT of the head and neck which did not show any acute intracranial abnormality. His chest x-ray showed bilateral pneumonia. Patients urine toxicology showed positive benzodiazepines. Patient was intubated and put on ventilator and IV propofol drip for sedation. Hospitalist service was consulted for further care of the patient. Not much history could be obtained from the patient as he is on sedation with IV propofol drip and on ventilator.  PAST MEDICAL HISTORY:   Past Medical History:  Diagnosis Date  . ABSCESS 12/03/2009  . ABSCESS, FINGER 04/07/2010  . ANXIETY 11/03/2009  . ASTHMA 11/03/2009  . CHRONIC OBSTRUCTIVE PULMONARY DISEASE, ACUTE EXACERBATION 11/03/2009  . COPD 11/03/2009  . DEPRESSION 11/03/2009  . DIABETES MELLITUS, TYPE II 11/03/2009  . [place] DISEASE, LUMBAR 11/03/2009  . EMPHYSEMA, BULLOUS 11/03/2009  . GERD 11/03/2009  . HYPERLIPIDEMIA 11/03/2009  . HYPERTENSION 11/03/2009  . Kidney stones 01/30/12   "I've had them 7 times;  always have passed them"  . PEPTIC ULCER DISEASE 11/03/2009  . Pneumonia   . RASH-NONVESICULAR 11/03/2009  . RESTLESS LEG SYNDROME 11/03/2009  . Shortness of breath    "sometimes; at any time"  . SPINAL STENOSIS, LUMBAR 11/03/2009    PAST SURGICAL HISTORY: Past Surgical History:  Procedure Laterality Date  . [place]   left  . INGUINAL HERNIA REPAIR  10/2011   left  . ROTATOR CUFF REPAIR  2003   left  . TONSILLECTOMY  1960    SOCIAL HISTORY:  Social History  Substance Use Topics  . Smoking status: Current Every Day Smoker    Packs/day: 1.00    Years: 41.00    Types: Cigarettes  . Smokeless tobacco: Never Used     Comment: "stopped smoking 04/21/1991 then restarted in 2012"  . Alcohol use No    FAMILY HISTORY:  Family History  Problem Relation Age of Onset  . Heart disease Father   . Heart disease Mother   . Cancer Brother     lung    DRUG ALLERGIES: No Known Allergies  REVIEW OF SYSTEMS:  Could not be obtained, patient on ventilator MEDICATIONS AT HOME:  Prior to Admission medications   Medication Sig Start Date End Date Taking? Authorizing Provider  albuterol (PROVENTIL HFA;VENTOLIN HFA) 108 (90 Base) MCG/ACT inhaler Inhale 1-2 puffs into the lungs every 6 (six) hours as needed for wheezing or shortness of breath.  06/03/13  Yes Historical Provider, MD  aspirin EC 81 MG tablet Take 81 mg by mouth daily.   Yes Historical Provider, MD  atorvastatin (LIPITOR) 40 MG tablet Take 1 tablet (40 mg total) by mouth daily. 12/16/16  Yes Bettey Costa, MD  [facility] 325 MG CAPS Take 2 capsules by mouth every 6 (six) hours as needed. 12/21/16  Yes Historical Provider, MD  Fluticasone-Salmeterol (ADVAIR) 100-50 MCG/DOSE AEPB Inhale 1 puff into the lungs 2 (two) times daily.   Yes Historical Provider, MD  meclizine (ANTIVERT) 12.5 MG tablet Take 1 tablet by mouth every 6 (six) hours as needed. 12/21/16  Yes Historical Provider, MD  lisinopril  (PRINIVIL,ZESTRIL) 10 MG tablet Take 10 mg by mouth daily.    Historical Provider, MD      PHYSICAL EXAMINATION:   VITAL SIGNS: Blood pressure 99/70, pulse 84, temperature (!) 95.9 F (35.5 C), resp. rate (!) 21, height '5\' 8"'$  (1.727 m), weight 75.2 kg (165 lb 12.8 oz), SpO2 94 %.  GENERAL:  83 y.o.-year-old patient lying in the bed on ventilator EYES: Pupils equal, round, reactive to light and accommodation. No scleral icterus. Extraocular muscles intact.  HEENT: Head atraumatic, normocephalic. Oropharynx ET tube noted. NECK:  Supple, no jugular venous distention. No thyroid enlargement, no tenderness.  LUNGS: Decreased breath sounds bilaterally, rales heard in both lungs.  CARDIOVASCULAR: S1, S2 irregular. No murmurs, rubs, or gallops.  ABDOMEN: Soft, nontender, nondistended. Bowel sounds present. No organomegaly or mass.  EXTREMITIES: No pedal edema, cyanosis, or clubbing.  NEUROLOGIC: Not oriented to time, place and person. On ventilator.  PSYCHIATRIC: could not be assesed SKIN: No obvious rash, lesion, or ulcer.   LABORATORY PANEL:   CBC  Recent Labs Lab 01/15/17 2036  WBC 11.7*  HGB 16.8  HCT 50.3  PLT 224  MCV 91.5  MCH 30.6  MCHC 33.5  RDW 14.4  LYMPHSABS 2.5  MONOABS 0.6  EOSABS 0.2  BASOSABS 0.1   ------------------------------------------------------------------------------------------------------------------  Chemistries   Recent Labs Lab 01/15/17 2036  NA 138  K 4.1  CL 107  CO2 24  GLUCOSE 86  BUN 16  CREATININE 0.97  CALCIUM 8.2*  AST 19  ALT 14*  ALKPHOS 65  BILITOT 0.9   ------------------------------------------------------------------------------------------------------------------ estimated creatinine clearance is 58.8 mL/min (by C-G formula based on SCr of 0.97 mg/dL). ------------------------------------------------------------------------------------------------------------------ No results for input(s): TSH, T4TOTAL, T3FREE,  THYROIDAB in the last 72 hours.  Invalid input(s): FREET3   Coagulation profile  Recent Labs Lab 01/15/17 2036  INR 0.93   ------------------------------------------------------------------------------------------------------------------- No results for input(s): DDIMER in the last 72 hours. -------------------------------------------------------------------------------------------------------------------  Cardiac Enzymes  Recent Labs Lab 01/15/17 2036  TROPONINI <0.03   ------------------------------------------------------------------------------------------------------------------ Invalid input(s): POCBNP  ---------------------------------------------------------------------------------------------------------------  Urinalysis    Component Value Date/Time   COLORURINE YELLOW (A) 01/15/2017 2108   APPEARANCEUR CLEAR (A) 01/15/2017 2108   APPEARANCEUR Hazy 05/15/2012 1605   LABSPEC 1.023 01/15/2017 2108   LABSPEC 1.020 05/15/2012 1605   PHURINE 6.0 01/15/2017 2108   GLUCOSEU 50 (A) 01/15/2017 2108   GLUCOSEU Negative 05/15/2012 1605   GLUCOSEU >=1000 11/03/2009 1019   HGBUR NEGATIVE 01/15/2017 2108   BILIRUBINUR NEGATIVE 01/15/2017 2108   BILIRUBINUR Negative 05/15/2012 1605   KETONESUR NEGATIVE 01/15/2017 2108   PROTEINUR NEGATIVE 01/15/2017 2108   UROBILINOGEN 0.2 03/17/2012 0216   NITRITE POSITIVE (A) 01/15/2017 2108   LEUKOCYTESUR NEGATIVE 01/15/2017 2108   LEUKOCYTESUR Negative 05/15/2012 1605     RADIOLOGY: Ct Angio Head W Or Wo Contrast  Result Date: 01/15/2017 CLINICAL DATA:  Found down by family, unresponsive and intubated. History of atrial fibrillation, hypertension, diabetes. EXAM: CT ANGIOGRAPHY  HEAD AND NECK TECHNIQUE: Multidetector CT imaging of the head and neck was performed using the standard protocol during bolus administration of intravenous contrast. Multiplanar CT image reconstructions and MIPs were obtained to evaluate the vascular  anatomy. Carotid stenosis measurements (when applicable) are obtained utilizing NASCET criteria, using the distal internal carotid diameter as the denominator. CONTRAST:  75 cc Isovue 370 COMPARISON:  CT angiogram of the head and neck December 15, 2016 and CT HEAD January 15, 2017 at 2051 hours FINDINGS: CTA NECK AORTIC ARCH: Normal appearance of the thoracic arch, normal branch pattern. Mild calcific atherosclerosis of the aortic arch. The origins of the innominate, left Common carotid artery and subclavian artery are widely patent. RIGHT CAROTID SYSTEM: Common carotid artery is widely patent. Normal appearance of the carotid bifurcation without hemodynamically significant stenosis by NASCET criteria, mild calcific atherosclerosis. Normal appearance of the included internal carotid artery. LEFT CAROTID SYSTEM: Common carotid artery is widely patent. Normal appearance of the carotid bifurcation without hemodynamically significant stenosis by NASCET criteria, mild calcific atherosclerosis. Normal appearance of the included internal carotid artery. VERTEBRAL ARTERIES:Codominant vertebral artery's. Normal appearance of the vertebral arteries, which appear widely patent. SKELETON: No acute osseous process though bone windows have not been submitted. Patient is edentulous and intubated, tip above the carina with debris layering trachea. Mild fluid distended esophagus with nasogastric tube. Multi level moderate to severe degenerative change of the cervical spine without acute osseous process. OTHER NECK: Soft tissues of the neck are non-acute though, not tailored for evaluation. Severe emphysema and apical bullous changes. CTA HEAD ANTERIOR CIRCULATION: Patent cervical internal carotid arteries, petrous, cavernous and supra clinoid internal carotid arteries. Widely patent anterior communicating artery. Patent anterior and middle cerebral arteries. No large vessel occlusion, hemodynamically significant stenosis, dissection,  luminal irregularity, contrast extravasation or aneurysm. POSTERIOR CIRCULATION: Patent vertebral arteries, vertebrobasilar junction and basilar artery, as well as main branch vessels. Patent posterior cerebral arteries. No large vessel occlusion, hemodynamically significant stenosis, dissection, luminal irregularity, contrast extravasation or aneurysm. VENOUS SINUSES: Major dural venous sinuses are patent though not tailored for evaluation on this angiographic examination. ANATOMIC VARIANTS: None. DELAYED PHASE: No abnormal intracranial enhancement. MIP images reviewed. IMPRESSION: CTA NECK: Stable examination: Mild atherosclerosis without hemodynamically significant stenosis nor acute vascular process. Intubation, debris layering within the trachea. CTA HEAD: Stable examination: No emergent large vessel occlusion or severe stenosis. Electronically Signed   By: Elon Alas M.D.   On: 01/15/2017 22:23   Dg Abdomen 1 View  Result Date: 01/15/2017 CLINICAL DATA:  Evaluate OG tube placement EXAM: ABDOMEN - 1 VIEW COMPARISON:  None. FINDINGS: The OG tube terminates in the left upper quadrant of the abdomen. The side port is in the fundus. The distal tip has flipped back on itself just beneath the GE junction. IMPRESSION: The OG tube terminates in the stomach. The side port is in the fundus. The distal tip has flipped back on itself and terminates just beneath the GE junction. Electronically Signed   By: Dorise Bullion III M.D   On: 01/15/2017 21:33   Ct Head Wo Contrast  Result Date: 01/15/2017 CLINICAL DATA:  Patient found unresponsive. EXAM: CT HEAD WITHOUT CONTRAST TECHNIQUE: Contiguous axial images were obtained from the base of the skull through the vertex without intravenous contrast. COMPARISON:  December 15, 2016 FINDINGS: Brain: No subdural, epidural, or subarachnoid hemorrhage. No mass effect or midline shift. Cerebellum, brainstem, and basal cisterns are normal. Ventricles and sulci are  unremarkable. No acute cortical ischemia or infarct.  Vascular: No hyperdense vessel or unexpected calcification. Skull: Normal. Negative for fracture or focal lesion. Sinuses/Orbits: No acute finding. Other: None. IMPRESSION: No acute abnormalities. Electronically Signed   By: Dorise Bullion III M.D   On: 01/15/2017 21:13   Ct Angio Neck W And/or Wo Contrast  Result Date: 01/15/2017 CLINICAL DATA:  Found down by family, unresponsive and intubated. History of atrial fibrillation, hypertension, diabetes. EXAM: CT ANGIOGRAPHY HEAD AND NECK TECHNIQUE: Multidetector CT imaging of the head and neck was performed using the standard protocol during bolus administration of intravenous contrast. Multiplanar CT image reconstructions and MIPs were obtained to evaluate the vascular anatomy. Carotid stenosis measurements (when applicable) are obtained utilizing NASCET criteria, using the distal internal carotid diameter as the denominator. CONTRAST:  75 cc Isovue 370 COMPARISON:  CT angiogram of the head and neck December 15, 2016 and CT HEAD January 15, 2017 at 2051 hours FINDINGS: CTA NECK AORTIC ARCH: Normal appearance of the thoracic arch, normal branch pattern. Mild calcific atherosclerosis of the aortic arch. The origins of the innominate, left Common carotid artery and subclavian artery are widely patent. RIGHT CAROTID SYSTEM: Common carotid artery is widely patent. Normal appearance of the carotid bifurcation without hemodynamically significant stenosis by NASCET criteria, mild calcific atherosclerosis. Normal appearance of the included internal carotid artery. LEFT CAROTID SYSTEM: Common carotid artery is widely patent. Normal appearance of the carotid bifurcation without hemodynamically significant stenosis by NASCET criteria, mild calcific atherosclerosis. Normal appearance of the included internal carotid artery. VERTEBRAL ARTERIES:Codominant vertebral artery's. Normal appearance of the vertebral arteries, which  appear widely patent. SKELETON: No acute osseous process though bone windows have not been submitted. Patient is edentulous and intubated, tip above the carina with debris layering trachea. Mild fluid distended esophagus with nasogastric tube. Multi level moderate to severe degenerative change of the cervical spine without acute osseous process. OTHER NECK: Soft tissues of the neck are non-acute though, not tailored for evaluation. Severe emphysema and apical bullous changes. CTA HEAD ANTERIOR CIRCULATION: Patent cervical internal carotid arteries, petrous, cavernous and supra clinoid internal carotid arteries. Widely patent anterior communicating artery. Patent anterior and middle cerebral arteries. No large vessel occlusion, hemodynamically significant stenosis, dissection, luminal irregularity, contrast extravasation or aneurysm. POSTERIOR CIRCULATION: Patent vertebral arteries, vertebrobasilar junction and basilar artery, as well as main branch vessels. Patent posterior cerebral arteries. No large vessel occlusion, hemodynamically significant stenosis, dissection, luminal irregularity, contrast extravasation or aneurysm. VENOUS SINUSES: Major dural venous sinuses are patent though not tailored for evaluation on this angiographic examination. ANATOMIC VARIANTS: None. DELAYED PHASE: No abnormal intracranial enhancement. MIP images reviewed. IMPRESSION: CTA NECK: Stable examination: Mild atherosclerosis without hemodynamically significant stenosis nor acute vascular process. Intubation, debris layering within the trachea. CTA HEAD: Stable examination: No emergent large vessel occlusion or severe stenosis. Electronically Signed   By: Elon Alas M.D.   On: 01/15/2017 22:23   Dg Chest Portable 1 View  Result Date: 01/15/2017 CLINICAL DATA:  ETT placement EXAM: PORTABLE CHEST 1 VIEW COMPARISON:  Multiple chest x-rays since November 13, 2016 FINDINGS: The ETT is in good position. The distal NG tube is not  well seen. Stable cardiomediastinal silhouette. Increasing opacities in the lungs, diffuse on the right more focal in the left base. IMPRESSION: Increasing opacities in the lungs, diffuse on the right and more focal in the left base may represent edema or developing multifocal infection. Recommend clinical correlation and attention on follow-up. The ETT is in good position. Electronically Signed   By: Dorise Bullion  III M.D   On: 01/15/2017 21:33    EKG: Orders placed or performed during the hospital encounter of 01/15/17  . EKG 12-Lead  . EKG 12-Lead  . ED EKG  . ED EKG    IMPRESSION AND PLAN: 83 year old elderly male patient with history of COPD, type 2 diabetes mellitus, hypertension, GERD, peptic ulcer disease presented to the emergency room with altered mental status and respiratory distress. Admitting diagnosis 1. Acute encephalopathy 2. Acute respiratory failure 3. Bilateral pneumonia 4. Urinary tract infection 5. Emphysema Treatment plan Admit patient to ICU Start patient on IV propofol drip for sedation Continue mechanical ventilation Start patient on IV vancomycin and IV Zosyn antibiotics Follow-up cultures DVT prophylaxis with subcutaneous Lovenox 40 MG daily Intensivist consultation Transfer of care to critical care team Further management by intensivist and critical care team.  All the records are reviewed and case discussed with ED provider. Management plans discussed with the patient, family and they are in agreement.  CODE STATUS:FULL CODE    Code Status Orders        Start     Ordered   01/15/17 2242  Full code  Continuous     01/15/17 2243    Code Status History    Date Active Date Inactive Code Status Order ID Comments User Context   12/15/2016  8:54 PM 12/16/2016  9:21 PM Full Code [id number]  Dustin Flock, MD Inpatient   12/15/2016  6:40 PM 12/15/2016  8:54 PM DNR [id number]  Dustin Flock, MD ED   12/23/202018  9:22 PM 11/02/2016  5:40 PM DNR [id number]   Loletha Grayer, MD ED   03/17/2012  5:42 AM 03/19/2012  8:14 PM Full Code [id number]  Marylou Mccoy, RN Inpatient   01/30/2012  4:56 PM 02/03/2012  6:59 PM Full Code [id number]  Murlean Iba, MD Inpatient   08/11/2011  6:03 PM 08/13/2011  6:35 PM Full Code [id number]  Loyal Gambler, RN Inpatient       TOTAL CRITICAL CARE TIME TAKING CARE OF THIS PATIENT: 55 minutes.    Saundra Shelling M.D on 01/15/2017 at 11:19 PM  Between 7am to 6pm - Pager - [phone]  After 6pm go to [email or web address] - password [email or web address] [facility]  Office  [phone]  CC: Primary care physician; Pcp Not In System

## 2017-01-15 NOTE — ED Provider Notes (Signed)
[facility] Emergency Department Provider Note  Time seen: 8:46 PM  I have reviewed the triage vital signs and the nursing notes.   HISTORY  Chief Complaint No chief complaint on file.    HPI OTHELL JAIME is a 83 y.o. male with a past medical history of asthma, COPD, depression, diabetes, hypertension, hyperlipidemia, CVA, presents the emergency department unresponsive. According to EMS there was an initial call at 6:59 PM tonight for slurred speech and altered mental status, they were unable to locate that patient initially due to the wrong address being given. They state later this evening they received a call from the patient's niece, stating that the patient was unresponsive at the home. They came to the home and found the patient will see unresponsive, initially would open eyes to voice and pain however in route to the hospital they state that diminish and upon arrival to the emergency department they were not getting the patient to open his eyes or communicate in any way.EMS state the patient was found have a rhythm of atrial fibrillation in route to the hospital. Upon arrival the patient is unresponsive, on one occasion was able to get the patient to briefly open his eyes to sternal rub. Patient not communicating otherwise. No posturing noted. Vitals are largely within normal limits with a normal fingerstick.  Past Medical History:  Diagnosis Date  . ABSCESS 12/03/2009  . ABSCESS, FINGER 04/07/2010  . ANXIETY 11/03/2009  . ASTHMA 11/03/2009  . CHRONIC OBSTRUCTIVE PULMONARY DISEASE, ACUTE EXACERBATION 11/03/2009  . COPD 11/03/2009  . DEPRESSION 11/03/2009  . DIABETES MELLITUS, TYPE II 11/03/2009  . [place] DISEASE, LUMBAR 11/03/2009  . EMPHYSEMA, BULLOUS 11/03/2009  . GERD 11/03/2009  . HYPERLIPIDEMIA 11/03/2009  . HYPERTENSION 11/03/2009  . Kidney stones 01/30/12   "I've had them 7 times; always have passed them"  . PEPTIC ULCER DISEASE 11/03/2009  . Pneumonia   .  RASH-NONVESICULAR 11/03/2009  . RESTLESS LEG SYNDROME 11/03/2009  . Shortness of breath    "sometimes; at any time"  . SPINAL STENOSIS, LUMBAR 11/03/2009    Patient Active Problem List   Diagnosis Date Noted  . CVA (cerebral vascular accident) ([place]) 12/15/2016  . Chest pain 02/14/202018  . PNA (pneumonia) 03/17/2012  . Weakness generalized 03/17/2012  . Generalized weakness 01/30/2012  . Fall at home 01/30/2012  . Physical deconditioning 01/30/2012  . Nausea vomiting and diarrhea 01/15/2012  . UTI (urinary tract infection) 01/15/2012  . Cocaine abuse 08/11/2011  . Tobacco abuse 08/11/2011  . Orthostasis 12/23/2010  . Dehydration 12/23/2010  . Abdominal pain, other specified site 12/23/2010  . Dizziness 12/23/2010  . Weight loss 12/23/2010  . Left lumbar radiculopathy 12/23/2010  . DIABETES MELLITUS, TYPE II 11/03/2009  . HYPERLIPIDEMIA 11/03/2009  . ANXIETY 11/03/2009  . DEPRESSION 11/03/2009  . RESTLESS LEG SYNDROME 11/03/2009  . HYPERTENSION 11/03/2009  . EMPHYSEMA, BULLOUS 11/03/2009  . ASTHMA 11/03/2009  . COPD 11/03/2009  . GERD 11/03/2009  . PEPTIC ULCER DISEASE 11/03/2009  . [place] DISEASE, LUMBAR 11/03/2009  . SPINAL STENOSIS, LUMBAR 11/03/2009  . NEPHROLITHIASIS, HX OF 11/03/2009    Past Surgical History:  Procedure Laterality Date  . [place]   left  . INGUINAL HERNIA REPAIR  10/2011   left  . ROTATOR CUFF REPAIR  2003   left  . TONSILLECTOMY  1960    Prior to Admission medications   Medication Sig Start Date End Date Taking? Authorizing Provider  albuterol (PROVENTIL HFA;VENTOLIN HFA)  108 (90 Base) MCG/ACT inhaler Inhale 1-2 puffs into the lungs every 6 (six) hours as needed for wheezing or shortness of breath.  06/03/13   Historical Provider, MD  aspirin EC 81 MG tablet Take 81 mg by mouth daily.    Historical Provider, MD  atorvastatin (LIPITOR) 40 MG tablet Take 1 tablet (40 mg total) by mouth daily. 12/16/16   Bettey Costa, MD   Fluticasone-Salmeterol (ADVAIR) 100-50 MCG/DOSE AEPB Inhale 1 puff into the lungs 2 (two) times daily.    Historical Provider, MD  lisinopril (PRINIVIL,ZESTRIL) 10 MG tablet Take 10 mg by mouth daily.    Historical Provider, MD    No Known Allergies  Family History  Problem Relation Age of Onset  . Heart disease Father   . Heart disease Mother   . Cancer Brother     lung    Social History Social History  Substance Use Topics  . Smoking status: Current Every Day Smoker    Packs/day: 1.00    Years: 41.00    Types: Cigarettes  . Smokeless tobacco: Never Used     Comment: "stopped smoking 04/21/1991 then restarted in 2012"  . Alcohol use No    Review of Systems Unable to obtain review of systems due to unresponsiveness ____________________________________________   PHYSICAL EXAM:  VITAL SIGNS: ED Triage Vitals  Enc Vitals Group     BP 01/15/17 2037 134/89     Pulse Rate 01/15/17 2037 88     Resp 01/15/17 2037 20     Temp --      Temp src --      SpO2 01/15/17 2037 93 %     Weight 01/15/17 2041 170 lb (77.1 kg)     Height 01/15/17 2041 '5\' 8"'$  (1.727 m)     Head Circumference --      Peak Flow --      Pain Score --      Pain Loc --      Pain Edu? --      Excl. in [place]? --     Constitutional: Unresponsive, briefly opened eyes to sternal rub 1, for the most part unresponsive to painful stimuli. Eyes: Normal exam, 2-3 mm PERRL ENT   Head: Normocephalic and atraumatic.   Mouth/Throat: Mucous membranes are moist. False teeth in place. Cardiovascular: Irregular rhythm, rate around 90 bpm. Respiratory: Normal respiratory effort without tachypnea nor retractions. Breath sounds are clear and equal bilaterally.  Gastrointestinal: Soft, no distention. No reaction to abdominal palpation but largely unresponsive. Musculoskeletal: Patient is unresponsive, no trauma noted to extremities. Neurologic:  Patient unresponsive, not speaking. Not moving extremities. Skin:  Skin  is warm, dry and intact.  Psychiatric: Unresponsive  ____________________________________________    EKG  EKG reviewed and interpreted by myself appears so atrial fibrillation at 85 bpm with a narrow QRS and a normal axis, nonspecific ST changes without ST elevation.  ____________________________________________    RADIOLOGY  CT head negative  IMPRESSION: Increasing opacities in the lungs, diffuse on the right and more focal in the left base may represent edema or developing multifocal infection. Recommend clinical correlation and attention on follow-up.  The ETT is in good position.  ____________________________________________   INITIAL IMPRESSION / ASSESSMENT AND PLAN / ED COURSE  Pertinent labs & imaging results that were available during my care of the patient were reviewed by me and considered in my medical decision making (see chart for details).  The patient presents to the emergency department for unresponsiveness. The last  known well time is not entirely clear, per EMS there is report of a 911 call at [place] for slurred speech/altered mental status. They confirm that the patient may was the same as the patient brought to the hospital tonight. However this is not tells the last known well time only that his symptoms likely progressed rather quickly over the past 2 hours. Patient's unresponsiveness is concerning for possible CVA as the patient appears to be in an atrial fibrillation rhythm no known blood thinners. Currently awaiting family arrival for further information however EMS states the family who called EMS the second time states they just came home and found the patient like this that are unaware of any last known normal.  Patient's workup thus far has shown opacities in the lungs may represent edema or multifocal infection. Urinalysis is nitrite positive but otherwise unrevealing. CT scan of the head is negative. Of note the patient's urine toxicology screen is  positive for benzodiazepines (prior to ED versed use). I discussed with the niece, she states the patient does not take any medications and there is no access to medications in the house as the patient nor family members are currently taking benzodiazepine medications. She did state approximate 9 years ago the patient was hospitalized under suicide watch after his wife passed away and has recently been talking more of death, but no clear indication for a suicide attempt. The niece states the patient called a friend of his around 2 tonight and the friend was concerned because the patient was slurring his words, they called EMS but gave the wrong address by accident. The niece states when she got home she found the patient largely unresponsive and called EMS to the correct this. We do not have a last known normal time for the patient besides around 6 or 7:00 this morning when the niece left to go to work. We will send the patient for CT angiography scan to rule out large vessel disease. If the CTA is negative we will admit to the hospital for further treatment.  CRITICAL CARE Performed by: Harvest Dark   Total critical care time: 60 minutes  Critical care time was exclusive of separately billable procedures and treating other patients.  Critical care was necessary to treat or prevent imminent or life-threatening deterioration.  Critical care was time spent personally by me on the following activities: development of treatment plan with patient and/or surrogate as well as nursing, discussions with consultants, evaluation of patient's response to treatment, examination of patient, obtaining history from patient or surrogate, ordering and performing treatments and interventions, ordering and review of laboratory studies, ordering and review of radiographic studies, pulse oximetry and re-evaluation of patient's condition.  CT angiography is are negative for acute disease. X-ray consistent with  possible pneumonia. Urinalysis is nitrite positive. Detox is benzo positive. We'll continue to treat with IV antibiotics and admit to the ICU.  ____________________________________________   FINAL CLINICAL IMPRESSION(S) / ED DIAGNOSES  Unresponsiveness Pneumonia Urinary tract infection  benzodiazepine use   Harvest Dark, MD 01/15/17 2243

## 2017-01-15 NOTE — ED Notes (Signed)
EDP at bedside speaking to family. Pt's UDS positive for benzos, at this time pt does not appear to have any Rx for benzodiazepine, family unsure if pt has been taking any medications at home. Pt's niece reports pt is homeless and living with her, his brother recently had cardiac arrest and is on hospice care now. Niece reports pt has recently been making multiple comments about being "done with life." Hx depression.

## 2017-01-15 NOTE — ED Triage Notes (Signed)
Pt presents to ED c/o unresponsiveness. Found on floor by family members. Pulses present, pt able to breathe without assistance at this time. Opens eyes and withdraws to pain. NSR than converted to afib and back again per EMS.

## 2017-01-16 ENCOUNTER — Inpatient Hospital Stay: Payer: Medicare (Managed Care)

## 2017-01-16 DIAGNOSIS — T424X4D Poisoning by benzodiazepines, undetermined, subsequent encounter: Secondary | ICD-10-CM

## 2017-01-16 DIAGNOSIS — R4189 Other symptoms and signs involving cognitive functions and awareness: Secondary | ICD-10-CM

## 2017-01-16 DIAGNOSIS — J9600 Acute respiratory failure, unspecified whether with hypoxia or hypercapnia: Secondary | ICD-10-CM

## 2017-01-16 DIAGNOSIS — T424X4A Poisoning by benzodiazepines, undetermined, initial encounter: Secondary | ICD-10-CM

## 2017-01-16 DIAGNOSIS — J449 Chronic obstructive pulmonary disease, unspecified: Secondary | ICD-10-CM

## 2017-01-16 DIAGNOSIS — T424X1A Poisoning by benzodiazepines, accidental (unintentional), initial encounter: Secondary | ICD-10-CM

## 2017-01-16 DIAGNOSIS — J189 Pneumonia, unspecified organism: Secondary | ICD-10-CM

## 2017-01-16 LAB — COMPREHENSIVE METABOLIC PANEL
ALK PHOS: 47 U/L (ref 38–126)
ALT: 11 U/L — AB (ref 17–63)
AST: 12 U/L — ABNORMAL LOW (ref 15–41)
Albumin: 2.6 g/dL — ABNORMAL LOW (ref 3.5–5.0)
Anion gap: 4 — ABNORMAL LOW (ref 5–15)
BUN: 16 mg/dL (ref 6–20)
CALCIUM: 7.7 mg/dL — AB (ref 8.9–10.3)
CHLORIDE: 109 mmol/L (ref 101–111)
CO2: 27 mmol/L (ref 22–32)
CREATININE: 0.96 mg/dL (ref 0.61–1.24)
GFR calc non Af Amer: >60 mL/min (ref >60)
Glucose, Bld: 80 mg/dL (ref 65–99)
Potassium: 3.5 mmol/L (ref 3.5–5.1)
Sodium: 140 mmol/L (ref 135–145)
Total Bilirubin: 0.9 mg/dL (ref 0.3–1.2)
Total Protein: 4.5 g/dL — ABNORMAL LOW (ref 6.5–8.1)

## 2017-01-16 LAB — CBC
HCT: 37.7 % — ABNORMAL LOW (ref 40.0–52.0)
Hemoglobin: 12.9 g/dL — ABNORMAL LOW (ref 13.0–18.0)
MCH: 31.1 pg (ref 26.0–34.0)
MCHC: 34.2 g/dL (ref 32.0–36.0)
MCV: 91 fL (ref 80.0–100.0)
PLATELETS: 208 10*3/uL (ref 150–440)
RBC: 4.15 MIL/uL — AB (ref 4.40–5.90)
RDW: 14.3 % (ref 11.5–14.5)
WBC: 7.9 10*3/uL (ref 3.8–10.6)

## 2017-01-16 LAB — GLUCOSE, CAPILLARY
GLUCOSE-CAPILLARY: 60 mg/dL — AB (ref 65–99)
Glucose-Capillary: 51 mg/dL — ABNORMAL LOW (ref 65–99)
Glucose-Capillary: 86 mg/dL (ref 65–99)

## 2017-01-16 LAB — TROPONIN I: Troponin I: <0.03 ng/mL (ref <0.03)

## 2017-01-16 LAB — TRIGLYCERIDES: Triglycerides: 131 mg/dL (ref <150)

## 2017-01-16 LAB — INFLUENZA PANEL BY PCR (TYPE A & B)
INFLAPCR: NEGATIVE
Influenza B By PCR: NEGATIVE

## 2017-01-16 LAB — MRSA PCR SCREENING: MRSA by PCR: NEGATIVE

## 2017-01-16 LAB — PROCALCITONIN

## 2017-01-16 MED ORDER — FENTANYL BOLUS VIA INFUSION
25.0000 ug | INTRAVENOUS | Status: DC | PRN
  Filled 2017-01-16: qty 50

## 2017-01-16 MED ORDER — DEXTROSE 5 % IV SOLN
0.0000 ug/min | INTRAVENOUS | Status: DC
  Administered 2017-01-16: 10 ug/min via INTRAVENOUS
  Filled 2017-01-16: qty 16

## 2017-01-16 MED ORDER — FENTANYL CITRATE (PF) 100 MCG/2ML IJ SOLN: 50.0000 ug | Freq: Once | INTRAMUSCULAR | Status: DC

## 2017-01-16 MED ORDER — SODIUM CHLORIDE 0.9 % IV BOLUS (SEPSIS)
1000.0000 mL | INTRAVENOUS | Status: AC
  Administered 2017-01-16 (×2): 1000 mL via INTRAVENOUS

## 2017-01-16 MED ORDER — CHLORHEXIDINE GLUCONATE 0.12% ORAL RINSE (MEDLINE KIT)
15.0000 mL | Freq: Two times a day (BID) | OROMUCOSAL | Status: DC
  Administered 2017-01-16 (×2): 15 mL via OROMUCOSAL

## 2017-01-16 MED ORDER — ASPIRIN EC 81 MG PO TBEC
81.0000 mg | DELAYED_RELEASE_TABLET | Freq: Every day | ORAL | Status: DC
  Administered 2017-01-16 – 2017-01-17 (×2): 81 mg via ORAL
  Filled 2017-01-16 (×2): qty 1

## 2017-01-16 MED ORDER — ATORVASTATIN CALCIUM 20 MG PO TABS
40.0000 mg | ORAL_TABLET | Freq: Every day | ORAL | Status: DC
  Administered 2017-01-16 – 2017-01-17 (×2): 40 mg via ORAL
  Filled 2017-01-16 (×2): qty 2

## 2017-01-16 MED ORDER — IPRATROPIUM-ALBUTEROL 0.5-2.5 (3) MG/3ML IN SOLN
3.0000 mL | Freq: Four times a day (QID) | RESPIRATORY_TRACT | Status: DC
  Administered 2017-01-16 (×2): 3 mL via RESPIRATORY_TRACT
  Filled 2017-01-16 (×2): qty 3

## 2017-01-16 MED ORDER — DEXTROSE 50 % IV SOLN
INTRAVENOUS | Status: AC
  Filled 2017-01-16: qty 50

## 2017-01-16 MED ORDER — ESCITALOPRAM OXALATE 10 MG PO TABS
5.0000 mg | ORAL_TABLET | Freq: Every day | ORAL | Status: DC
  Administered 2017-01-16 – 2017-01-17 (×2): 5 mg via ORAL
  Filled 2017-01-16 (×2): qty 1

## 2017-01-16 MED ORDER — FLUTICASONE FUROATE-VILANTEROL 200-25 MCG/INH IN AEPB
1.0000 | INHALATION_SPRAY | Freq: Every day | RESPIRATORY_TRACT | Status: DC
  Administered 2017-01-16 – 2017-01-17 (×2): 1 via RESPIRATORY_TRACT
  Filled 2017-01-16: qty 28

## 2017-01-16 MED ORDER — IPRATROPIUM-ALBUTEROL 0.5-2.5 (3) MG/3ML IN SOLN: 3.0000 mL | RESPIRATORY_TRACT | Status: DC | PRN

## 2017-01-16 MED ORDER — LISINOPRIL 10 MG PO TABS
10.0000 mg | ORAL_TABLET | Freq: Every day | ORAL | Status: DC
  Administered 2017-01-16 – 2017-01-17 (×2): 10 mg via ORAL
  Filled 2017-01-16 (×2): qty 1

## 2017-01-16 MED ORDER — FENTANYL 2500MCG IN NS 250ML (10MCG/ML) PREMIX INFUSION
25.0000 ug/h | INTRAVENOUS | Status: DC
  Administered 2017-01-16: 100 ug/h via INTRAVENOUS
  Filled 2017-01-16: qty 250

## 2017-01-16 MED ORDER — MIDAZOLAM HCL 2 MG/2ML IJ SOLN
1.0000 mg | INTRAMUSCULAR | Status: DC | PRN
  Administered 2017-01-16: 1 mg via INTRAVENOUS

## 2017-01-16 MED ORDER — SODIUM CHLORIDE 0.9% FLUSH: 10.0000 mL | Freq: Two times a day (BID) | INTRAVENOUS | Status: DC

## 2017-01-16 MED ORDER — ORAL CARE MOUTH RINSE: 15.0000 mL | Freq: Four times a day (QID) | OROMUCOSAL | Status: DC

## 2017-01-16 MED ORDER — MIDAZOLAM HCL 2 MG/2ML IJ SOLN
1.0000 mg | INTRAMUSCULAR | Status: DC | PRN
  Filled 2017-01-16: qty 2

## 2017-01-16 MED ORDER — VANCOMYCIN HCL 10 G IV SOLR
1250.0000 mg | Freq: Two times a day (BID) | INTRAVENOUS | Status: DC
  Administered 2017-01-16: 1250 mg via INTRAVENOUS
  Filled 2017-01-16 (×2): qty 1250

## 2017-01-16 MED ORDER — SODIUM CHLORIDE 0.9% FLUSH: 10.0000 mL | INTRAVENOUS | Status: DC | PRN

## 2017-01-16 NOTE — Progress Notes (Signed)
PT PROFILE: 83 y.o. M who lives with his niece, found unresponsive and brought to ED where he was intubated. Niece reports that he has been expressing suicidal ideation recently. UDS was positive for BZDs.   MAJOR EVENTS/TEST RESULTS: CT head 04/29: NAD CTA head/neck 04/29: no significant arterial stenoses  INDWELLING DEVICES:: ETT 04/29 >> 04/30 R IJ CVL 04/29 >>   MICRO DATA: MRSA PCR 04/29 >> NEG Urine 04/29 >>  Blood 04/29 >>   ANTIMICROBIALS:  Ceftriaxone 04/29 >> 04/30 Vanc 04/29 >> 04/30 Pip-tazo 04/29 >> 04/30  SUBJ: Passed SBT. Follows commands. Strong cough. Minimal mucoid secretions. Extubated and comfortable on [place] O2  OBJ: Vitals:   01/16/17 1000 01/16/17 1100 01/16/17 1200 01/16/17 1300  BP: 126/67 124/70 127/70 129/70  Pulse: 79 74 74 73  Resp: '13 13 12 13  '$ Temp: 98.2 F (36.8 C) 98.2 F (36.8 C) 98.4 F (36.9 C) 98.4 F (36.9 C)  TempSrc:   Core (Comment)   SpO2: 92% 96% 97% 96%  Weight:      Height:        NAD HEENT: NCAT, sclerae white Neck: no JVD Chest: clear anteriorly Card: reg, no M Abd: soft, + BS Ext: warm, no edema Neuro: no focal deficits  BMP Latest Ref Rng & Units 01/16/2017 01/15/2017 12/15/2016  Glucose 65 - 99 mg/dL 80 86 108(H)  BUN 6 - 20 mg/dL 16 16 23(H)  Creatinine 0.61 - 1.24 mg/dL 0.96 0.97 1.02  Sodium 135 - 145 mmol/L 140 138 137  Potassium 3.5 - 5.1 mmol/L 3.5 4.1 3.9  Chloride 101 - 111 mmol/L 109 107 104  CO2 22 - 32 mmol/L '27 24 27  '$ Calcium 8.9 - 10.3 mg/dL 7.7(L) 8.2(L) 8.8(L)   CBC Latest Ref Rng & Units 01/16/2017 01/15/2017 12/15/2016  WBC 3.8 - 10.6 K/uL 7.9 11.7(H) 12.0(H)  Hemoglobin 13.0 - 18.0 g/dL 12.9(L) 16.8 15.0  Hematocrit 40.0 - 52.0 % 37.7(L) 50.3 44.7  Platelets 150 - 440 K/uL 208 224 237   Trop I < 0.03 X 3  CXR: no infiltrates or edema. Changes of COPD noted  IMPRESSION: Acute respiratory failure - intubated due to AMS COPD Doubt PNA DM 2, controlled Acute encephalopathy with UDS + for  benzodiazepines  Recent suicidal ideation  Concern for intentional OD  PLAN/REC: Monitor in ICU post extubation Cont nebulized bronchodilators DC abx Advance diet and activity as able Psychiatry evaluation requested Possible transfer out of ICU later in day if tolerating extubation well  CCM time 35 mins  Merton Border, MD PCCM service Mobile [phone] Pager [phone] 01/16/2017 2:12 PM

## 2017-01-16 NOTE — Progress Notes (Signed)
Pt has remained alert and oriented since extubation this am around 0934 to [facility]. Pt has remained on 2LNC with NDN. Lung sounds clear to auscultation. RR even and unlabored. NSR on cardiac monitor. Psych consult for noted depression and reports of concern for suicide per niece. Niece has been updated-pt request to make her POA - Pt has orders to transfer to the floor.

## 2017-01-16 NOTE — Progress Notes (Signed)
   01/16/17 1900  Clinical Encounter Type  Visited With Patient  Visit Type Initial;Psychological support;Spiritual support;Social support  Referral From Nurse;Physician  Spiritual Encounters  Spiritual Needs Prayer;Emotional;Grief support  CH met at bedside with patient; patient denies any suicidal ideation but still grieving over loss of wife of 54 yrs 8 years ago; [employer] did observe that patient concerned over his loss of time and whereabouts prior to being admitted; spiritual, emotional and some grief care along with prayer offered. Gwynn Burly 7:32 PM

## 2017-01-16 NOTE — Progress Notes (Signed)
Extubated without complications to 2lnc 

## 2017-01-16 NOTE — Progress Notes (Signed)
Patient was transferred from ICU, report received from [facility]. Dressing to Right jugular intact. 4 NSls in place. Skin assessment completed with Ana. A&O x4. Up with assist x1 to [facility]. Bed alarm on for safety. Oriented to room, call light, TV and bed controls.

## 2017-01-16 NOTE — Progress Notes (Signed)
[facility] [place] NAME: [name] [name]    MR#:  [id number]  DATE OF BIRTH:  [birth year]  SUBJECTIVE:  CHIEF COMPLAINT:  Brought with altered mental status.    Intubated for airway protection, extubated early in morning and stable, so transferred to floor. Seen in afternoon. No complains.  REVIEW OF SYSTEMS:  CONSTITUTIONAL: No fever, fatigue or weakness.  EYES: No blurred or double vision.  EARS, NOSE, AND THROAT: No tinnitus or ear pain.  RESPIRATORY: No cough, shortness of breath, wheezing or hemoptysis.  CARDIOVASCULAR: No chest pain, orthopnea, edema.  GASTROINTESTINAL: No nausea, vomiting, diarrhea or abdominal pain.  GENITOURINARY: No dysuria, hematuria.  ENDOCRINE: No polyuria, nocturia,  HEMATOLOGY: No anemia, easy bruising or bleeding SKIN: No rash or lesion. MUSCULOSKELETAL: No joint pain or arthritis.   NEUROLOGIC: No tingling, numbness, weakness.  PSYCHIATRY: No anxiety or depression.   ROS  DRUG ALLERGIES:  No Known Allergies  VITALS:  Blood pressure 131/61, pulse 95, temperature 98 F (36.7 C), temperature source Oral, resp. rate 18, height '5\' 8"'$  (1.727 m), weight 72.3 kg (159 lb 6.3 oz), SpO2 97 %.  PHYSICAL EXAMINATION:  GENERAL:  83 y.o.-year-old patient lying in the bed with no acute distress.  EYES: Pupils equal, round, reactive to light and accommodation. No scleral icterus. Extraocular muscles intact.  HEENT: Head atraumatic, normocephalic. Oropharynx and nasopharynx clear.  NECK:  Supple, no jugular venous distention. No thyroid enlargement, no tenderness.  LUNGS: Normal breath sounds bilaterally, no wheezing, some crepitation. No use of accessory muscles of respiration.  CARDIOVASCULAR: S1, S2 normal. No murmurs, rubs, or gallops.  ABDOMEN: Soft, nontender, nondistended. Bowel sounds present. No organomegaly or mass.  EXTREMITIES: No pedal edema, cyanosis, or clubbing.  NEUROLOGIC: Cranial nerves II through XII  are intact. Muscle strength 4/5 in all extremities. Sensation intact. Gait not checked.  PSYCHIATRIC: The patient is alert and oriented x 3.  SKIN: No obvious rash, lesion, or ulcer.   Physical Exam LABORATORY PANEL:   CBC  Recent Labs Lab 01/16/17 0501  WBC 7.9  HGB 12.9*  HCT 37.7*  PLT 208   ------------------------------------------------------------------------------------------------------------------  Chemistries   Recent Labs Lab 01/16/17 0501  NA 140  K 3.5  CL 109  CO2 27  GLUCOSE 80  BUN 16  CREATININE 0.96  CALCIUM 7.7*  AST 12*  ALT 11*  ALKPHOS 47  BILITOT 0.9   ------------------------------------------------------------------------------------------------------------------  Cardiac Enzymes  Recent Labs Lab 01/15/17 2344 01/16/17 0501  TROPONINI <0.03 <0.03   ------------------------------------------------------------------------------------------------------------------  RADIOLOGY:  Ct Angio Head W Or Wo Contrast  Result Date: 01/15/2017 CLINICAL DATA:  Found down by family, unresponsive and intubated. History of atrial fibrillation, hypertension, diabetes. EXAM: CT ANGIOGRAPHY HEAD AND NECK TECHNIQUE: Multidetector CT imaging of the head and neck was performed using the standard protocol during bolus administration of intravenous contrast. Multiplanar CT image reconstructions and MIPs were obtained to evaluate the vascular anatomy. Carotid stenosis measurements (when applicable) are obtained utilizing NASCET criteria, using the distal internal carotid diameter as the denominator. CONTRAST:  75 cc Isovue 370 COMPARISON:  CT angiogram of the head and neck December 15, 2016 and CT HEAD January 15, 2017 at 2051 hours FINDINGS: CTA NECK AORTIC ARCH: Normal appearance of the thoracic arch, normal branch pattern. Mild calcific atherosclerosis of the aortic arch. The origins of the innominate, left Common carotid artery and subclavian artery are widely patent.  RIGHT CAROTID SYSTEM: Common carotid artery is widely patent. Normal appearance  of the carotid bifurcation without hemodynamically significant stenosis by NASCET criteria, mild calcific atherosclerosis. Normal appearance of the included internal carotid artery. LEFT CAROTID SYSTEM: Common carotid artery is widely patent. Normal appearance of the carotid bifurcation without hemodynamically significant stenosis by NASCET criteria, mild calcific atherosclerosis. Normal appearance of the included internal carotid artery. VERTEBRAL ARTERIES:Codominant vertebral artery's. Normal appearance of the vertebral arteries, which appear widely patent. SKELETON: No acute osseous process though bone windows have not been submitted. Patient is edentulous and intubated, tip above the carina with debris layering trachea. Mild fluid distended esophagus with nasogastric tube. Multi level moderate to severe degenerative change of the cervical spine without acute osseous process. OTHER NECK: Soft tissues of the neck are non-acute though, not tailored for evaluation. Severe emphysema and apical bullous changes. CTA HEAD ANTERIOR CIRCULATION: Patent cervical internal carotid arteries, petrous, cavernous and supra clinoid internal carotid arteries. Widely patent anterior communicating artery. Patent anterior and middle cerebral arteries. No large vessel occlusion, hemodynamically significant stenosis, dissection, luminal irregularity, contrast extravasation or aneurysm. POSTERIOR CIRCULATION: Patent vertebral arteries, vertebrobasilar junction and basilar artery, as well as main branch vessels. Patent posterior cerebral arteries. No large vessel occlusion, hemodynamically significant stenosis, dissection, luminal irregularity, contrast extravasation or aneurysm. VENOUS SINUSES: Major dural venous sinuses are patent though not tailored for evaluation on this angiographic examination. ANATOMIC VARIANTS: None. DELAYED PHASE: No abnormal  intracranial enhancement. MIP images reviewed. IMPRESSION: CTA NECK: Stable examination: Mild atherosclerosis without hemodynamically significant stenosis nor acute vascular process. Intubation, debris layering within the trachea. CTA HEAD: Stable examination: No emergent large vessel occlusion or severe stenosis. Electronically Signed   By: Elon Alas M.D.   On: 01/15/2017 22:23   Dg Chest 1 View  Result Date: 01/16/2017 CLINICAL DATA:  Central line placement. EXAM: CHEST 1 VIEW COMPARISON:  Chest radiograph January 15, 2017 FINDINGS: New RIGHT internal jugular central venous catheter distal tip projects in proximal superior vena cava. Endotracheal tube tip projects 5.4 cm above the carina. Nasogastric tube past proximal stomach, distal tip not imaged. No pneumothorax. Cardiac silhouette is mildly enlarged and unchanged. Mediastinal silhouette is nonsuspicious, calcified aortic knob. LEFT apical bullous changes in chronic interstitial prominence, pulmonary hyperinflation. Strandy densities in lung bases. No pleural effusion. Soft tissue planes included osseous structures are unchanged. IMPRESSION: New RIGHT internal jugular central venous catheter distal tip projects in proximal superior vena cava. No apparent change in remaining life support lines. COPD with LEFT apical bullous changes and bibasilar atelectasis, there could be a component pneumonia. Electronically Signed   By: Elon Alas M.D.   On: 01/16/2017 05:29   Dg Abdomen 1 View  Result Date: 01/15/2017 CLINICAL DATA:  Evaluate OG tube placement EXAM: ABDOMEN - 1 VIEW COMPARISON:  None. FINDINGS: The OG tube terminates in the left upper quadrant of the abdomen. The side port is in the fundus. The distal tip has flipped back on itself just beneath the GE junction. IMPRESSION: The OG tube terminates in the stomach. The side port is in the fundus. The distal tip has flipped back on itself and terminates just beneath the GE junction.  Electronically Signed   By: Dorise Bullion III M.D   On: 01/15/2017 21:33   Ct Head Wo Contrast  Result Date: 01/15/2017 CLINICAL DATA:  Patient found unresponsive. EXAM: CT HEAD WITHOUT CONTRAST TECHNIQUE: Contiguous axial images were obtained from the base of the skull through the vertex without intravenous contrast. COMPARISON:  December 15, 2016 FINDINGS: Brain: No subdural, epidural, or subarachnoid  hemorrhage. No mass effect or midline shift. Cerebellum, brainstem, and basal cisterns are normal. Ventricles and sulci are unremarkable. No acute cortical ischemia or infarct. Vascular: No hyperdense vessel or unexpected calcification. Skull: Normal. Negative for fracture or focal lesion. Sinuses/Orbits: No acute finding. Other: None. IMPRESSION: No acute abnormalities. Electronically Signed   By: Dorise Bullion III M.D   On: 01/15/2017 21:13   Ct Angio Neck W And/or Wo Contrast  Result Date: 01/15/2017 CLINICAL DATA:  Found down by family, unresponsive and intubated. History of atrial fibrillation, hypertension, diabetes. EXAM: CT ANGIOGRAPHY HEAD AND NECK TECHNIQUE: Multidetector CT imaging of the head and neck was performed using the standard protocol during bolus administration of intravenous contrast. Multiplanar CT image reconstructions and MIPs were obtained to evaluate the vascular anatomy. Carotid stenosis measurements (when applicable) are obtained utilizing NASCET criteria, using the distal internal carotid diameter as the denominator. CONTRAST:  75 cc Isovue 370 COMPARISON:  CT angiogram of the head and neck December 15, 2016 and CT HEAD January 15, 2017 at 2051 hours FINDINGS: CTA NECK AORTIC ARCH: Normal appearance of the thoracic arch, normal branch pattern. Mild calcific atherosclerosis of the aortic arch. The origins of the innominate, left Common carotid artery and subclavian artery are widely patent. RIGHT CAROTID SYSTEM: Common carotid artery is widely patent. Normal appearance of the carotid  bifurcation without hemodynamically significant stenosis by NASCET criteria, mild calcific atherosclerosis. Normal appearance of the included internal carotid artery. LEFT CAROTID SYSTEM: Common carotid artery is widely patent. Normal appearance of the carotid bifurcation without hemodynamically significant stenosis by NASCET criteria, mild calcific atherosclerosis. Normal appearance of the included internal carotid artery. VERTEBRAL ARTERIES:Codominant vertebral artery's. Normal appearance of the vertebral arteries, which appear widely patent. SKELETON: No acute osseous process though bone windows have not been submitted. Patient is edentulous and intubated, tip above the carina with debris layering trachea. Mild fluid distended esophagus with nasogastric tube. Multi level moderate to severe degenerative change of the cervical spine without acute osseous process. OTHER NECK: Soft tissues of the neck are non-acute though, not tailored for evaluation. Severe emphysema and apical bullous changes. CTA HEAD ANTERIOR CIRCULATION: Patent cervical internal carotid arteries, petrous, cavernous and supra clinoid internal carotid arteries. Widely patent anterior communicating artery. Patent anterior and middle cerebral arteries. No large vessel occlusion, hemodynamically significant stenosis, dissection, luminal irregularity, contrast extravasation or aneurysm. POSTERIOR CIRCULATION: Patent vertebral arteries, vertebrobasilar junction and basilar artery, as well as main branch vessels. Patent posterior cerebral arteries. No large vessel occlusion, hemodynamically significant stenosis, dissection, luminal irregularity, contrast extravasation or aneurysm. VENOUS SINUSES: Major dural venous sinuses are patent though not tailored for evaluation on this angiographic examination. ANATOMIC VARIANTS: None. DELAYED PHASE: No abnormal intracranial enhancement. MIP images reviewed. IMPRESSION: CTA NECK: Stable examination: Mild  atherosclerosis without hemodynamically significant stenosis nor acute vascular process. Intubation, debris layering within the trachea. CTA HEAD: Stable examination: No emergent large vessel occlusion or severe stenosis. Electronically Signed   By: Elon Alas M.D.   On: 01/15/2017 22:23   Dg Chest Portable 1 View  Result Date: 01/15/2017 CLINICAL DATA:  ETT placement EXAM: PORTABLE CHEST 1 VIEW COMPARISON:  Multiple chest x-rays since November 13, 2016 FINDINGS: The ETT is in good position. The distal NG tube is not well seen. Stable cardiomediastinal silhouette. Increasing opacities in the lungs, diffuse on the right more focal in the left base. IMPRESSION: Increasing opacities in the lungs, diffuse on the right and more focal in the left base may represent edema  or developing multifocal infection. Recommend clinical correlation and attention on follow-up. The ETT is in good position. Electronically Signed   By: Dorise Bullion III M.D   On: 01/15/2017 21:33    ASSESSMENT AND PLAN:   Principal Problem:   Overdose of benzodiazepine Active Problems:   Respiratory failure ([place])   Unresponsiveness   HCAP (healthcare-associated pneumonia)   * acute respi failure   Acute encephalopathy- likely toxic encephalopathy.      As per niece- he have suicidal tendency.   Urine dr screen positive for benzodiazepines.   Intubated, extubated- stable.   Psych evaluation.  * Hyperlipidemia   Cont statin.  * hypertension   BP stable without meds, hold lisinopril for now.  * COPD    No exacerbation, cont inhalers and nebs.  * Suspected sepsis on admission with pneumonia   But ruled out. Appears not infected, monitor.   All the records are reviewed and case discussed with Care Management/Social Workerr. Management plans discussed with the patient, family and they are in agreement.  CODE STATUS: full, though pt expressed wishes for DNR- will discuss with presence of his niece, once  confirmed about his psych issues.  TOTAL TIME TAKING CARE OF THIS PATIENT: 35 minutes.    POSSIBLE D/C IN 1-2 DAYS, DEPENDING ON CLINICAL CONDITION.   Vaughan Basta M.D on 01/16/2017   Between 7am to 6pm - Pager - [phone]  After 6pm go to [email or web address] - password [email or web address] [facility]  Office  [phone]  CC: Primary care physician; Pcp Not In System  Note: This dictation was prepared with Dragon dictation along with smaller phrase technology. Any transcriptional errors that result from this process are unintentional.

## 2017-01-16 NOTE — Consult Note (Signed)
[facility] Face-to-Face Psychiatry Consult   Reason for Consult:  Consult for 83 year old man with history of COPD brought into the hospital with altered mental status. Concern about overdose Referring Physician:  Marthann Schiller Patient Identification: [name] [name] MRN:  [id number] Principal Diagnosis: Overdose of benzodiazepine Diagnosis:   Patient Active Problem List   Diagnosis Date Noted  . Overdose of benzodiazepine [T42.4X1A] 01/16/2017  . Unresponsiveness [R41.89]   . HCAP (healthcare-associated pneumonia) [J18.9]   . Respiratory failure ([place]) [J96.90] 01/15/2017  . CVA (cerebral vascular accident) ([place]) [I63.9] 12/15/2016  . Chest pain [R07.9] 05/16/202018  . PNA (pneumonia) [J18.9] 03/17/2012  . Weakness generalized [R53.1] 03/17/2012  . Generalized weakness [R53.1] 01/30/2012  . Fall at home [W19.Merril Abbe, Q03.474] 01/30/2012  . Physical deconditioning [R53.81] 01/30/2012  . Nausea vomiting and diarrhea [R11.2, R19.7] 01/15/2012  . UTI (urinary tract infection) [N39.0] 01/15/2012  . Cocaine abuse [F14.10] 08/11/2011  . Tobacco abuse [Z72.0] 08/11/2011  . Orthostasis [I95.1] 12/23/2010  . Dehydration [E86.0] 12/23/2010  . Abdominal pain, other specified site [R10.9] 12/23/2010  . Dizziness [R42] 12/23/2010  . Weight loss [R63.4] 12/23/2010  . Left lumbar radiculopathy [M54.16] 12/23/2010  . DIABETES MELLITUS, TYPE II [E11.9] 11/03/2009  . HYPERLIPIDEMIA [E78.5] 11/03/2009  . ANXIETY [F41.1] 11/03/2009  . DEPRESSION [F32.9] 11/03/2009  . RESTLESS LEG SYNDROME [G25.81] 11/03/2009  . HYPERTENSION [I10] 11/03/2009  . 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 ASTHMA [J45.909] 11/03/2009  . COPD [J44.9] 11/03/2009  . GERD [K21.9] 11/03/2009  . PEPTIC ULCER DISEASE [K27.9] 11/03/2009  . DISC DISEASE, LUMBAR [M51.37] 11/03/2009  . SPINAL STENOSIS, LUMBAR [M48.061] 11/03/2009  . NEPHROLITHIASIS, HX OF [Z87.442] 11/03/2009    Total Time spent with patient: 1 hour  Subjective:   [name] [name] is a 83 y.o. male patient admitted with "I do not know".  HPI:  Patient interviewed. Chart reviewed. This is an 83 year old man who was brought into the hospital yesterday after family found him a month's of. Workup in the emergency room and intensive care unit showed pneumonia but also showed drug screen positive for benzodiazepines with no record of benzodiazepine prescription. Secondhand report is that his niece said he had made suicidal statements recently. On interview today the patient says he remembers cleaning up his home where he lives with his niece yesterday and then going to sit down on the sofa and the next thing he remembers is being here in the intensive care unit. Patient denies any recent feelings of depression or negativity. Denies any significant problems with sleep or appetite. Totally denies any suicidal thoughts at all. Makes quite a big deal of telling me how it is against his religion. Denies having taken any medication other than what he is prescribed none of which is sedating medication. I pointed out to him his positive drug screen, not to mention that he had a drug screen positive for barbiturates a month ago which was apparently completely overlooked. Patient continues to state he has not taken any sedative medicines and there is no record of any prescription of it. Denies psychotic symptoms. Admits that losing his wife several years ago was of course stressful but says that he does not feel like it is a constant source of sadness anymore. Denies any drug use.  Social history: Apparently he is living with his niece. I got conflicting reports from him about how long he had been living with his niece but I believe the correct answer is just a few months since coming back from [place]. Patient  is a widower and is retired. He has children but secondhand it was reported that he was estranged from them.  Medical history: Currently has a pneumonia and urinary tract infection had  atrial fibrillation on presentation. History of diabetes  Substance abuse history: Patient denied alcohol abuse and denied any drug abuse. Review of the old chart shows that several years ago he did have an active problem with cocaine abuse which she admits to after I pointed it out to him. He says he does not use any drugs at all anymore.  Past Psychiatric History: Patient claimed to me to have never seen any kind of mental health provider or had any kind of treatment for depression. Review of the chart shows that is not exactly true. In 2013 he had emergency room hospitalizations for depression and even had a recommendation for hospitalization which was never carried out. Not clear than that he ever followed up with outpatient provider. No clear evidence that he is ever tried to kill himself although the issue was raised in the past. No history of psychosis.  Risk to Self: Is patient at risk for suicide?: No (Pt neice report pt has verbalized ending life, MD notified) Risk to Others:   Prior Inpatient Therapy:   Prior Outpatient Therapy:    Past Medical History:  Past Medical History:  Diagnosis Date  . ABSCESS 12/03/2009  . ABSCESS, FINGER 04/07/2010  . ANXIETY 11/03/2009  . ASTHMA 11/03/2009  . CHRONIC OBSTRUCTIVE PULMONARY DISEASE, ACUTE EXACERBATION 11/03/2009  . COPD 11/03/2009  . DEPRESSION 11/03/2009  . DIABETES MELLITUS, TYPE II 11/03/2009  . [place] DISEASE, LUMBAR 11/03/2009  . EMPHYSEMA, BULLOUS 11/03/2009  . GERD 11/03/2009  . HYPERLIPIDEMIA 11/03/2009  . HYPERTENSION 11/03/2009  . Kidney stones 01/30/12   "I've had them 7 times; always have passed them"  . PEPTIC ULCER DISEASE 11/03/2009  . Pneumonia   . RASH-NONVESICULAR 11/03/2009  . RESTLESS LEG SYNDROME 11/03/2009  . Shortness of breath    "sometimes; at any time"  . SPINAL STENOSIS, LUMBAR 11/03/2009    Past Surgical History:  Procedure Laterality Date  . [place]   left  . INGUINAL HERNIA REPAIR  10/2011    left  . ROTATOR CUFF REPAIR  2003   left  . TONSILLECTOMY  1960   Family History:  Family History  Problem Relation Age of Onset  . Heart disease Father   . Heart disease Mother   . Cancer Brother     lung   Family Psychiatric  History: Claims to know no family history of mental coma Social History:  History  Alcohol Use No     History  Drug Use No    Social History   Social History  . Marital status: Widowed    Spouse name: N/A  . Number of children: 2  . Years of education: N/A   Occupational History  .  Retired   Social History Main Topics  . Smoking status: Current Every Day Smoker    Packs/day: 1.00    Years: 41.00    Types: Cigarettes  . Smokeless tobacco: Never Used     Comment: "stopped smoking 04/21/1991 then restarted in 2012"  . Alcohol use No  . Drug use: No  . Sexual activity: Not Currently    Birth control/ protection: None   Other Topics Concern  . None   Social History Narrative  . None   Additional Social History:    Allergies:  No Known  Allergies  Labs:  Results for orders placed or performed during the hospital encounter of 01/15/17 (from the past 48 hour(s))  Ethanol     Status: None   Collection Time: 01/15/17  8:36 PM  Result Value Ref Range   Alcohol, Ethyl (B) <5 <5 mg/dL    Comment:        LOWEST DETECTABLE LIMIT FOR SERUM ALCOHOL IS 5 mg/dL FOR MEDICAL PURPOSES ONLY   CBC     Status: Abnormal   Collection Time: 01/15/17  8:36 PM  Result Value Ref Range   WBC 11.7 (H) 3.8 - 10.6 K/uL   RBC 5.50 4.40 - 5.90 MIL/uL   Hemoglobin 16.8 13.0 - 18.0 g/dL   HCT 50.3 40.0 - 52.0 %   MCV 91.5 80.0 - 100.0 fL   MCH 30.6 26.0 - 34.0 pg   MCHC 33.5 32.0 - 36.0 g/dL   RDW 14.4 11.5 - 14.5 %   Platelets 224 150 - 440 K/uL  Differential     Status: Abnormal   Collection Time: 01/15/17  8:36 PM  Result Value Ref Range   Neutrophils Relative % 70 %   Neutro Abs 8.3 (H) 1.4 - 6.5 K/uL   Lymphocytes Relative 22 %   Lymphs Abs 2.5  1.0 - 3.6 K/uL   Monocytes Relative 5 %   Monocytes Absolute 0.6 0.2 - 1.0 K/uL   Eosinophils Relative 2 %   Eosinophils Absolute 0.2 0 - 0.7 K/uL   Basophils Relative 1 %   Basophils Absolute 0.1 0 - 0.1 K/uL  Comprehensive metabolic panel     Status: Abnormal   Collection Time: 01/15/17  8:36 PM  Result Value Ref Range   Sodium 138 135 - 145 mmol/L   Potassium 4.1 3.5 - 5.1 mmol/L   Chloride 107 101 - 111 mmol/L   CO2 24 22 - 32 mmol/L   Glucose, Bld 86 65 - 99 mg/dL   BUN 16 6 - 20 mg/dL   Creatinine, Ser 0.97 0.61 - 1.24 mg/dL   Calcium 8.2 (L) 8.9 - 10.3 mg/dL   Total Protein 6.2 (L) 6.5 - 8.1 g/dL   Albumin 3.3 (L) 3.5 - 5.0 g/dL   AST 19 15 - 41 U/L   ALT 14 (L) 17 - 63 U/L   Alkaline Phosphatase 65 38 - 126 U/L   Total Bilirubin 0.9 0.3 - 1.2 mg/dL   GFR calc non Af Amer >60 >60 mL/min   GFR calc Af Amer >60 >60 mL/min    Comment: (NOTE) The eGFR has been calculated using the CKD EPI equation. This calculation has not been validated in all clinical situations. eGFR's persistently <60 mL/min signify possible Chronic Kidney Disease.    Anion gap 7 5 - 15  Troponin I     Status: None   Collection Time: 01/15/17  8:36 PM  Result Value Ref Range   Troponin I <0.03 <0.03 ng/mL  Protime-INR     Status: None   Collection Time: 01/15/17  8:36 PM  Result Value Ref Range   Prothrombin Time 12.5 11.4 - 15.2 seconds   INR 0.93   APTT     Status: None   Collection Time: 01/15/17  8:36 PM  Result Value Ref Range   aPTT 24 24 - 36 seconds  Glucose, capillary     Status: None   Collection Time: 01/15/17  8:38 PM  Result Value Ref Range   Glucose-Capillary 70 65 - 99 mg/dL  Blood gas,  arterial     Status: Abnormal   Collection Time: 01/15/17  9:05 PM  Result Value Ref Range   FIO2 0.50    Delivery systems VENTILATOR    Mode ASSIST CONTROL    VT 450 mL   Peep/cpap 5.0 cm H20   pH, Arterial 7.39 7.350 - 7.450   pCO2 arterial 46 32.0 - 48.0 mmHg   pO2, Arterial 110 (H)  83.0 - 108.0 mmHg   Bicarbonate 27.8 20.0 - 28.0 mmol/L   Acid-Base Excess 2.2 (H) 0.0 - 2.0 mmol/L   O2 Saturation 98.2 %   Patient temperature 37.0    Collection site LEFT RADIAL    Sample type ARTERIAL DRAW    Allens test (pass/fail) PASS PASS   Mechanical Rate 20   Urinalysis, Routine w reflex microscopic     Status: Abnormal   Collection Time: 01/15/17  9:08 PM  Result Value Ref Range   Color, Urine YELLOW (A) YELLOW   APPearance CLEAR (A) CLEAR   Specific Gravity, Urine 1.023 1.005 - 1.030   pH 6.0 5.0 - 8.0   Glucose, UA 50 (A) NEGATIVE mg/dL   Hgb urine dipstick NEGATIVE NEGATIVE   Bilirubin Urine NEGATIVE NEGATIVE   Ketones, ur NEGATIVE NEGATIVE mg/dL   Protein, ur NEGATIVE NEGATIVE mg/dL   Nitrite POSITIVE (A) NEGATIVE   Leukocytes, UA NEGATIVE NEGATIVE   RBC / HPF 0-5 0 - 5 RBC/hpf   WBC, UA 6-30 0 - 5 WBC/hpf   Bacteria, UA FEW (A) NONE SEEN   Squamous Epithelial / LPF NONE SEEN NONE SEEN   Mucous PRESENT   Urine Drug Screen, Qualitative ([facility] only)     Status: Abnormal   Collection Time: 01/15/17  9:08 PM  Result Value Ref Range   Tricyclic, Ur Screen NONE DETECTED NONE DETECTED   Amphetamines, Ur Screen NONE DETECTED NONE DETECTED   MDMA (Ecstasy)Ur Screen NONE DETECTED NONE DETECTED   Cocaine Metabolite,Ur [place] NONE DETECTED NONE DETECTED   Opiate, Ur Screen NONE DETECTED NONE DETECTED   Phencyclidine (PCP) Ur S NONE DETECTED NONE DETECTED   Cannabinoid 50 Ng, Ur [place] NONE DETECTED NONE DETECTED   Barbiturates, Ur Screen NONE DETECTED NONE DETECTED   Benzodiazepine, Ur Scrn POSITIVE (A) NONE DETECTED   Methadone Scn, Ur NONE DETECTED NONE DETECTED    Comment: (NOTE) 638  Tricyclics, urine               Cutoff 1000 ng/mL 200  Amphetamines, urine             Cutoff 1000 ng/mL 300  MDMA (Ecstasy), urine           Cutoff 500 ng/mL 400  Cocaine Metabolite, urine       Cutoff 300 ng/mL 500  Opiate, urine                   Cutoff 300 ng/mL 600  Phencyclidine (PCP),  urine      Cutoff 25 ng/mL 700  Cannabinoid, urine              Cutoff 50 ng/mL 800  Barbiturates, urine             Cutoff 200 ng/mL 900  Benzodiazepine, urine           Cutoff 200 ng/mL 1000 Methadone, urine                Cutoff 300 ng/mL 1100 1200 The urine drug screen provides only a preliminary, unconfirmed 1300  analytical test result and should not be used for non-medical 1400 purposes. Clinical consideration and professional judgment should 1500 be applied to any positive drug screen result due to possible 1600 interfering substances. A more specific alternate chemical method 1700 must be used in order to obtain a confirmed analytical result.  1800 Gas chromato graphy / mass spectrometry (GC/MS) is the preferred 1900 confirmatory method.   Glucose, capillary     Status: None   Collection Time: 01/15/17 11:38 PM  Result Value Ref Range   Glucose-Capillary 95 65 - 99 mg/dL  Troponin I     Status: None   Collection Time: 01/15/17 11:44 PM  Result Value Ref Range   Troponin I <0.03 <0.03 ng/mL  Blood culture (routine x 2)     Status: None (Preliminary result)   Collection Time: 01/15/17 11:44 PM  Result Value Ref Range   Specimen Description BLOOD RIGHT HAND    Special Requests      BOTTLES DRAWN AEROBIC AND ANAEROBIC Blood Culture adequate volume   Culture NO GROWTH < 12 HOURS    Report Status PENDING   Triglycerides     Status: None   Collection Time: 01/15/17 11:44 PM  Result Value Ref Range   Triglycerides 131 <150 mg/dL  Procalcitonin - Baseline     Status: None   Collection Time: 01/15/17 11:44 PM  Result Value Ref Range   Procalcitonin <0.10 ng/mL    Comment:        Interpretation: PCT (Procalcitonin) <= 0.5 ng/mL: Systemic infection (sepsis) is not likely. Local bacterial infection is possible. (NOTE)         ICU PCT Algorithm               Non ICU PCT Algorithm    ----------------------------     ------------------------------         PCT < 0.25 ng/mL                  PCT < 0.1 ng/mL     Stopping of antibiotics            Stopping of antibiotics       strongly encouraged.               strongly encouraged.    ----------------------------     ------------------------------       PCT level decrease by               PCT < 0.25 ng/mL       >= 80% from peak PCT       OR PCT 0.25 - 0.5 ng/mL          Stopping of antibiotics                                             encouraged.     Stopping of antibiotics           encouraged.    ----------------------------     ------------------------------       PCT level decrease by              PCT >= 0.25 ng/mL       < 80% from peak PCT        AND PCT >= 0.5 ng/mL            Continuin g antibiotics  encouraged.       Continuing antibiotics            encouraged.    ----------------------------     ------------------------------     PCT level increase compared          PCT > 0.5 ng/mL         with peak PCT AND          PCT >= 0.5 ng/mL             Escalation of antibiotics                                          strongly encouraged.      Escalation of antibiotics        strongly encouraged.   Blood culture (routine x 2)     Status: None (Preliminary result)   Collection Time: 01/15/17 11:56 PM  Result Value Ref Range   Specimen Description BLOOD RIGHT HAND    Special Requests      BOTTLES DRAWN AEROBIC AND ANAEROBIC Blood Culture results may not be optimal due to an inadequate volume of blood received in culture bottles   Culture NO GROWTH < 12 HOURS    Report Status PENDING   MRSA PCR Screening     Status: None   Collection Time: 01/16/17 12:27 AM  Result Value Ref Range   MRSA by PCR NEGATIVE NEGATIVE    Comment:        The GeneXpert MRSA Assay (FDA approved for NASAL specimens only), is one component of a comprehensive MRSA colonization surveillance program. It is not intended to diagnose MRSA infection nor to guide or monitor treatment  for MRSA infections.   Influenza panel by PCR (type A & B)     Status: None   Collection Time: 01/16/17  1:38 AM  Result Value Ref Range   Influenza A By PCR NEGATIVE NEGATIVE   Influenza B By PCR NEGATIVE NEGATIVE    Comment: (NOTE) The Xpert Xpress Flu assay is intended as an aid in the diagnosis of  influenza and should not be used as a sole basis for treatment.  This  assay is FDA approved for nasopharyngeal swab specimens only. Nasal  washings and aspirates are unacceptable for Xpert Xpress Flu testing.   Troponin I     Status: None   Collection Time: 01/16/17  5:01 AM  Result Value Ref Range   Troponin I <0.03 <0.03 ng/mL  Comprehensive metabolic panel     Status: Abnormal   Collection Time: 01/16/17  5:01 AM  Result Value Ref Range   Sodium 140 135 - 145 mmol/L   Potassium 3.5 3.5 - 5.1 mmol/L   Chloride 109 101 - 111 mmol/L   CO2 27 22 - 32 mmol/L   Glucose, Bld 80 65 - 99 mg/dL   BUN 16 6 - 20 mg/dL   Creatinine, Ser 0.96 0.61 - 1.24 mg/dL   Calcium 7.7 (L) 8.9 - 10.3 mg/dL   Total Protein 4.5 (L) 6.5 - 8.1 g/dL   Albumin 2.6 (L) 3.5 - 5.0 g/dL   AST 12 (L) 15 - 41 U/L   ALT 11 (L) 17 - 63 U/L   Alkaline Phosphatase 47 38 - 126 U/L   Total Bilirubin 0.9 0.3 - 1.2 mg/dL   GFR calc non Af Amer >60 >60 mL/min   GFR  calc Af Amer >60 >60 mL/min    Comment: (NOTE) The eGFR has been calculated using the CKD EPI equation. This calculation has not been validated in all clinical situations. eGFR's persistently <60 mL/min signify possible Chronic Kidney Disease.    Anion gap 4 (L) 5 - 15  CBC     Status: Abnormal   Collection Time: 01/16/17  5:01 AM  Result Value Ref Range   WBC 7.9 3.8 - 10.6 K/uL   RBC 4.15 (L) 4.40 - 5.90 MIL/uL   Hemoglobin 12.9 (L) 13.0 - 18.0 g/dL   HCT 37.7 (L) 40.0 - 52.0 %   MCV 91.0 80.0 - 100.0 fL   MCH 31.1 26.0 - 34.0 pg   MCHC 34.2 32.0 - 36.0 g/dL   RDW 14.3 11.5 - 14.5 %   Platelets 208 150 - 440 K/uL  Glucose, capillary      Status: Abnormal   Collection Time: 01/16/17  1:11 PM  Result Value Ref Range   Glucose-Capillary 51 (L) 65 - 99 mg/dL  Glucose, capillary     Status: Abnormal   Collection Time: 01/16/17  1:41 PM  Result Value Ref Range   Glucose-Capillary 60 (L) 65 - 99 mg/dL  Glucose, capillary     Status: None   Collection Time: 01/16/17  2:43 PM  Result Value Ref Range   Glucose-Capillary 86 65 - 99 mg/dL    Current Facility-Administered Medications  Medication Dose Route Frequency Provider Last Rate Last Dose  . chlorhexidine gluconate (MEDLINE KIT) (PERIDEX) 0.12 % solution 15 mL  15 mL Mouth Rinse BID Saundra Shelling, MD   15 mL at 01/16/17 1047  . dextrose 50 % solution           . enoxaparin (LOVENOX) injection 40 mg  40 mg Subcutaneous Q24H Saundra Shelling, MD   40 mg at 01/16/17 0117  . fluticasone furoate-vilanterol (BREO ELLIPTA) 200-25 MCG/INH 1 puff  1 puff Inhalation Daily Wilhelmina Mcardle, MD      . ipratropium-albuterol (DUONEB) 0.5-2.5 (3) MG/3ML nebulizer solution 3 mL  3 mL Nebulization Q4H PRN Wilhelmina Mcardle, MD      . ondansetron [facility]) injection 4 mg  4 mg Intravenous Q6H PRN Pavan Pyreddy, MD      . sodium chloride flush (NS) 0.9 % injection 10-40 mL  10-40 mL Intracatheter PRN Mikael Spray, NP        Musculoskeletal: Strength & Muscle Tone: within normal limits Gait & Station: normal Patient leans: N/A  Psychiatric Specialty Exam: Physical Exam  Nursing note and vitals reviewed. Constitutional: He appears well-developed and well-nourished.  HENT:  Head: Normocephalic and atraumatic.  Eyes: Conjunctivae are normal. Pupils are equal, round, and reactive to light.  Neck: Normal range of motion.  Cardiovascular: Regular rhythm and normal heart sounds.   Respiratory: Effort normal.  GI: Soft.  Musculoskeletal: Normal range of motion.  Neurological: He is alert.  Skin: Skin is warm and dry.  Psychiatric: He has a normal mood and affect. His speech is normal. He is  slowed. Cognition and memory are impaired. He expresses impulsivity. He expresses no suicidal ideation. He exhibits abnormal recent memory.    Review of Systems  Constitutional: Negative.   HENT: Negative.   Eyes: Negative.   Respiratory: Positive for shortness of breath.   Cardiovascular: Negative.   Gastrointestinal: Negative.   Musculoskeletal: Negative.   Skin: Negative.   Neurological: Negative.   Psychiatric/Behavioral: Positive for memory loss. Negative for depression, hallucinations, substance  abuse and suicidal ideas. The patient is not nervous/anxious and does not have insomnia.     Blood pressure (!) 143/65, pulse 96, temperature 99 F (37.2 C), resp. rate 18, height 5' 8" (1.727 m), weight 72.3 kg (159 lb 6.3 oz), SpO2 98 %.Body mass index is 24.24 kg/m.  General Appearance: Casual  Eye Contact:  Good  Speech:  Slow  Volume:  Decreased  Mood:  Euthymic  Affect:  Congruent  Thought Process:  Goal Directed  Orientation:  Full (Time, Place, and Person)  Thought Content:  Logical and Rumination  Suicidal Thoughts:  No  Homicidal Thoughts:  No  Memory:  Immediate;   Good Recent;   Poor Remote;   Poor  Judgement:  Fair  Insight:  Shallow  Psychomotor Activity:  Decreased  Concentration:  Concentration: Fair  Recall:  [employer] of Knowledge:  Fair  Language:  Fair  Akathisia:  No  Handed:  Right  AIMS (if indicated):     Assets:  [employer]ed forces logistics/support/administrative officer Social Support  ADL's:  Impaired  Cognition:  Impaired,  Mild  Sleep:        Treatment Plan Summary: Daily contact with patient to assess and evaluate symptoms and progress in treatment, Medication management and Plan 83 year old man who presents with altered mental status now resolved. On interview today he still has impaired short-term memory but is alert and oriented. He is denying symptoms of depression. There is some reason from the old chart to suggest that he might be covering up some symptoms. I was not  able to find a phone number for his niece so I could not get collateral information from her. Hard to tell whether the benzodiazepines weren't attempt at self-harm or were drug abuse or exactly what to make of it. At this point I don't think he meets commitment criteria. I do think it's worthwhile to add an antidepressant to his medication and I will go ahead and do so. I will follow-up in the hospital as long as he is hearing continue to speak with him.  Disposition: Patient does not meet criteria for psychiatric inpatient admission. Supportive therapy provided about ongoing stressors.  Alethia Berthold, MD 01/16/2017 5:19 PM

## 2017-01-16 NOTE — Progress Notes (Signed)
Pharmacy Antibiotic Note  [name] [name] is a 83 y.o. male admitted on 01/15/2017 with pneumonia.  Pharmacy has been consulted for Vanc/zosyn dosing.  Plan: Patient received vanc 1g IV x 1 in CCU  Will follow up w/ vanc 1.25g IV q12h w/ 6 hour stack dose. Will check a VT 5/1 @ 1800 prior to 4th dose. Will initiate zosyn 3.375g IV q8h Ke 0.0534 t1/2 13 ~ hours  Height: '5\' 8"'$  (172.7 cm) Weight: 165 lb 12.8 oz (75.2 kg) IBW/kg (Calculated) : 68.4  Temp (24hrs), Avg:96.2 F (35.7 C), Min:95.7 F (35.4 C), Max:96.7 F (35.9 C)   Recent Labs Lab 01/15/17 2036  WBC 11.7*  CREATININE 0.97    Estimated Creatinine Clearance: 58.8 mL/min (by C-G formula based on SCr of 0.97 mg/dL).    No Known Allergies  Thank you for allowing pharmacy to be a part of this patient's care.  Tobie Lords, PharmD, BCPS Clinical Pharmacist 01/16/2017

## 2017-01-16 NOTE — Consult Note (Signed)
PULMONARY / CRITICAL CARE MEDICINE   Name: [name] [name] MRN: [id number] DOB: May 28, 1934    ADMISSION DATE:  01/15/2017   CONSULTATION DATE: 01/16/2017  REFERRING MD:  Dr. Estanislado Pandy  REASON FOR CONSULTATION: Vent dependent respiratory failure  CHIEF COMPLAINT:  Acute change in mental status  HISTORY OF PRESENT ILLNESS:  This is an 83 y/o caucasian male with a significant PMH as outlined below who presented to the ED via EMS after being found unresponsive on the floor by family. History is obtained from ED/EMS records as patient is currently intubated. Upon EMS arrival, patient was able to open eyes to pain and voice commands. However, upon arrival in the ED, patient became unresponsive hence he was emergently intubated. PCCM was consulted for vent and ICU management. His initial cardiac rhythm was NSR then he converted to Afib. He is now in SR. he is hypothermic with temperatures ranging between 35 and 36C. He was last admitted on March 29 and discharged on March 30. 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  PAST SURGICAL HISTORY: He  has a past surgical history that includes Rotator cuff repair (2003); Tonsillectomy (1960); Foot neuroma surgery (1982); and Inguinal hernia repair (10/2011).  No Known  Allergies  No current facility-administered medications on file prior to encounter.    Current Outpatient Prescriptions on File Prior to Encounter  Medication Sig  . albuterol (PROVENTIL HFA;VENTOLIN HFA) 108 (90 Base) MCG/ACT inhaler Inhale 1-2 puffs into the lungs every 6 (six) hours as needed for wheezing or shortness of breath.   Marland Kitchen aspirin EC 81 MG tablet Take 81 mg by mouth daily.  Marland Kitchen atorvastatin (LIPITOR) 40 MG tablet Take 1 tablet (40 mg total) by mouth daily.  . Fluticasone-Salmeterol (ADVAIR) 100-50 MCG/DOSE AEPB Inhale 1 puff into the lungs 2 (two) times daily.  Marland Kitchen lisinopril (PRINIVIL,ZESTRIL) 10 MG tablet Take 10 mg by mouth daily.    FAMILY HISTORY:  His indicated that his mother is deceased. He indicated that his father is deceased. He indicated that the status of his brother is unknown.    SOCIAL HISTORY: He  reports that he has been smoking Cigarettes.  He has a 41.00 pack-year smoking history. He has never used smokeless tobacco. He reports that he does not drink alcohol or use drugs.  REVIEW OF SYSTEMS:   Unable to obtain as patient is currently intubated and sedated  SUBJECTIVE:    VITAL SIGNS: BP (!) 83/60   Pulse 72   Temp (!) 96.1 F (35.6 C)   Resp 20   Ht '5\' 8"'$  (1.727 m)   Wt 159 lb 6.3 oz (72.3 kg)   SpO2 93%   BMI 24.24 kg/m   HEMODYNAMICS:    VENTILATOR SETTINGS: Vent Mode: PRVC FiO2 (%):  [30 %-50 %] 30 % Set Rate:  [20 bmp] 20 bmp Vt Set:  [450 mL] 450 mL PEEP:  [5 cmH20] 5 cmH20  INTAKE / OUTPUT: No intake/output data recorded.  PHYSICAL EXAMINATION: General: No acute distress, appropriate for  age Neuro:  Withdraws to pain, pupils are equal and reactive, positive corneal reflexes HEENT: Head without any injury, trachea midline Cardiovascular:  Rate and rhythm regular, S1, S2 audible, no murmur, regurg or gallop, +2 pulses, no edema Lungs: ET tube, bilateral breath sounds, diminished in the bases, no wheezing wheezes Abdomen:   Nondistended, positive bowel sounds in all 4 quadrants, palpation reveals no organomegaly Musculoskeletal: No deformities, no joint swelling Skin: Warm and dry  LABS:  BMET  Recent Labs Lab 01/15/17 2036  NA 138  K 4.1  CL 107  CO2 24  BUN 16  CREATININE 0.97  GLUCOSE 86    Electrolytes  Recent Labs Lab 01/15/17 2036  CALCIUM 8.2*    CBC  Recent Labs Lab 01/15/17 2036  WBC 11.7*  HGB 16.8  HCT 50.3  PLT 224    Coag's  Recent Labs Lab 01/15/17 2036  APTT 24  INR 0.93    Sepsis Markers No results for input(s): LATICACIDVEN, PROCALCITON, O2SATVEN in the last 168 hours.  ABG  Recent Labs Lab 01/15/17 2105  PHART 7.39  PCO2ART 46  PO2ART 110*    Liver Enzymes  Recent Labs Lab 01/15/17 2036  AST 19  ALT 14*  ALKPHOS 65  BILITOT 0.9  ALBUMIN 3.3*    Cardiac Enzymes  Recent Labs Lab 01/15/17 2036 01/15/17 2344  TROPONINI <0.03 <0.03    Glucose  Recent Labs Lab 01/15/17 2038 01/15/17 2338  GLUCAP 70 95    Imaging Ct Angio Head W Or Wo Contrast  Result Date: 01/15/2017 CLINICAL DATA:  Found down by family, unresponsive and intubated. History of atrial fibrillation, hypertension, diabetes. EXAM: CT ANGIOGRAPHY HEAD AND NECK TECHNIQUE: Multidetector CT imaging of the head and neck was performed using the standard protocol during bolus administration of intravenous contrast. Multiplanar CT image reconstructions and MIPs were obtained to evaluate the vascular anatomy. Carotid stenosis measurements (when applicable) are obtained utilizing NASCET criteria, using the distal internal carotid diameter as the denominator. CONTRAST:  75 cc Isovue 370 COMPARISON:  CT angiogram of the head and neck December 15, 2016 and CT HEAD January 15, 2017 at 2051 hours FINDINGS: CTA NECK AORTIC ARCH: Normal appearance of the thoracic arch, normal branch pattern. Mild calcific atherosclerosis of the aortic arch. The origins of the innominate, left Common carotid  artery and subclavian artery are widely patent. RIGHT CAROTID SYSTEM: Common carotid artery is widely patent. Normal appearance of the carotid bifurcation without hemodynamically significant stenosis by NASCET criteria, mild calcific atherosclerosis. Normal appearance of the included internal carotid artery. LEFT CAROTID SYSTEM: Common carotid artery is widely patent. Normal appearance of the carotid bifurcation without hemodynamically significant stenosis by NASCET criteria, mild calcific atherosclerosis. Normal appearance of the included internal carotid artery. VERTEBRAL ARTERIES:Codominant vertebral artery's. Normal appearance of the vertebral arteries, which appear widely patent. SKELETON: No acute osseous process though bone windows have not been submitted. Patient is edentulous and intubated, tip above the carina with debris layering trachea. Mild fluid distended esophagus with nasogastric tube. Multi level moderate to severe degenerative change of the cervical spine without acute osseous process. OTHER NECK: Soft tissues of the neck are non-acute though, not tailored for evaluation. Severe emphysema and apical bullous changes. CTA HEAD ANTERIOR CIRCULATION: Patent cervical internal carotid arteries, petrous, cavernous and supra clinoid internal carotid arteries. Widely patent anterior communicating artery. Patent anterior and middle cerebral arteries. No large vessel occlusion, hemodynamically significant stenosis, dissection, luminal irregularity, contrast extravasation or aneurysm. POSTERIOR CIRCULATION: Patent vertebral arteries,  vertebrobasilar junction and basilar artery, as well as main branch vessels. Patent posterior cerebral arteries. No large vessel occlusion, hemodynamically significant stenosis, dissection, luminal irregularity, contrast extravasation or aneurysm. VENOUS SINUSES: Major dural venous sinuses are patent though not tailored for evaluation on this angiographic examination. ANATOMIC  VARIANTS: None. DELAYED PHASE: No abnormal intracranial enhancement. MIP images reviewed. IMPRESSION: CTA NECK: Stable examination: Mild atherosclerosis without hemodynamically significant stenosis nor acute vascular process. Intubation, debris layering within the trachea. CTA HEAD: Stable examination: No emergent large vessel occlusion or severe stenosis. Electronically Signed   By: Elon Alas M.D.   On: 01/15/2017 22:23   Dg Abdomen 1 View  Result Date: 01/15/2017 CLINICAL DATA:  Evaluate OG tube placement EXAM: ABDOMEN - 1 VIEW COMPARISON:  None. FINDINGS: The OG tube terminates in the left upper quadrant of the abdomen. The side port is in the fundus. The distal tip has flipped back on itself just beneath the GE junction. IMPRESSION: The OG tube terminates in the stomach. The side port is in the fundus. The distal tip has flipped back on itself and terminates just beneath the GE junction. Electronically Signed   By: Dorise Bullion III M.D   On: 01/15/2017 21:33   Ct Head Wo Contrast  Result Date: 01/15/2017 CLINICAL DATA:  Patient found unresponsive. EXAM: CT HEAD WITHOUT CONTRAST TECHNIQUE: Contiguous axial images were obtained from the base of the skull through the vertex without intravenous contrast. COMPARISON:  December 15, 2016 FINDINGS: Brain: No subdural, epidural, or subarachnoid hemorrhage. No mass effect or midline shift. Cerebellum, brainstem, and basal cisterns are normal. Ventricles and sulci are unremarkable. No acute cortical ischemia or infarct. Vascular: No hyperdense vessel or unexpected calcification. Skull: Normal. Negative for fracture or focal lesion. Sinuses/Orbits: No acute finding. Other: None. IMPRESSION: No acute abnormalities. Electronically Signed   By: Dorise Bullion III M.D   On: 01/15/2017 21:13   Ct Angio Neck W And/or Wo Contrast  Result Date: 01/15/2017 CLINICAL DATA:  Found down by family, unresponsive and intubated. History of atrial fibrillation,  hypertension, diabetes. EXAM: CT ANGIOGRAPHY HEAD AND NECK TECHNIQUE: Multidetector CT imaging of the head and neck was performed using the standard protocol during bolus administration of intravenous contrast. Multiplanar CT image reconstructions and MIPs were obtained to evaluate the vascular anatomy. Carotid stenosis measurements (when applicable) are obtained utilizing NASCET criteria, using the distal internal carotid diameter as the denominator. CONTRAST:  75 cc Isovue 370 COMPARISON:  CT angiogram of the head and neck December 15, 2016 and CT HEAD January 15, 2017 at 2051 hours FINDINGS: CTA NECK AORTIC ARCH: Normal appearance of the thoracic arch, normal branch pattern. Mild calcific atherosclerosis of the aortic arch. The origins of the innominate, left Common carotid artery and subclavian artery are widely patent. RIGHT CAROTID SYSTEM: Common carotid artery is widely patent. Normal appearance of the carotid bifurcation without hemodynamically significant stenosis by NASCET criteria, mild calcific atherosclerosis. Normal appearance of the included internal carotid artery. LEFT CAROTID SYSTEM: Common carotid artery is widely patent. Normal appearance of the carotid bifurcation without hemodynamically significant stenosis by NASCET criteria, mild calcific atherosclerosis. Normal appearance of the included internal carotid artery. VERTEBRAL ARTERIES:Codominant vertebral artery's. Normal appearance of the vertebral arteries, which appear widely patent. SKELETON: No acute osseous process though bone windows have not been submitted. Patient is edentulous and intubated, tip above the carina with debris layering trachea. Mild fluid distended esophagus with nasogastric tube. Multi level moderate to severe degenerative change of the cervical spine  without acute osseous process. OTHER NECK: Soft tissues of the neck are non-acute though, not tailored for evaluation. Severe emphysema and apical bullous changes. CTA HEAD  ANTERIOR CIRCULATION: Patent cervical internal carotid arteries, petrous, cavernous and supra clinoid internal carotid arteries. Widely patent anterior communicating artery. Patent anterior and middle cerebral arteries. No large vessel occlusion, hemodynamically significant stenosis, dissection, luminal irregularity, contrast extravasation or aneurysm. POSTERIOR CIRCULATION: Patent vertebral arteries, vertebrobasilar junction and basilar artery, as well as main branch vessels. Patent posterior cerebral arteries. No large vessel occlusion, hemodynamically significant stenosis, dissection, luminal irregularity, contrast extravasation or aneurysm. VENOUS SINUSES: Major dural venous sinuses are patent though not tailored for evaluation on this angiographic examination. ANATOMIC VARIANTS: None. DELAYED PHASE: No abnormal intracranial enhancement. MIP images reviewed. IMPRESSION: CTA NECK: Stable examination: Mild atherosclerosis without hemodynamically significant stenosis nor acute vascular process. Intubation, debris layering within the trachea. CTA HEAD: Stable examination: No emergent large vessel occlusion or severe stenosis. Electronically Signed   By: Elon Alas M.D.   On: 01/15/2017 22:23   Dg Chest Portable 1 View  Result Date: 01/15/2017 CLINICAL DATA:  ETT placement EXAM: PORTABLE CHEST 1 VIEW COMPARISON:  Multiple chest x-rays since November 13, 2016 FINDINGS: The ETT is in good position. The distal NG tube is not well seen. Stable cardiomediastinal silhouette. Increasing opacities in the lungs, diffuse on the right more focal in the left base. IMPRESSION: Increasing opacities in the lungs, diffuse on the right and more focal in the left base may represent edema or developing multifocal infection. Recommend clinical correlation and attention on follow-up. The ETT is in good position. Electronically Signed   By: Dorise Bullion III M.D   On: 01/15/2017 21:33     STUDIES:  2-D echo from March  2018 shows a LV EF: 60% -   65%  CULTURES: Up cultures 2. Urine culture  ANTIBIOTICS: Vancomycin Ceftriaxone SIGNIFICANT EVENTS: 01/15/2017: ED with acute respiratory failure, intubated and admitted to the ICU  LINES/TUBES: Peripheral IVs Right IJ ET tube  DISCUSSION: This is a 83 year old Caucasian male presenting with acute respiratory failure secondary to pneumonia  ASSESSMENT  Acute respiratory failure. Septic shock Sepsis of unknown origin-likely pulmonary or urinary Acute encephalopathy Acute COPD exacerbation r/o Acute CVA History of Type 2 diabetes mellitus History of hypertension History of hyperlipidemia  PLAN Full vent support with current settings; wean as tolerated Chest x-ray and ABG post intubation reviewed. IV fluids and norepinephrine infusions, titrate to maintain mean arterial blood pressure greater than 65 Broad-spectrum antibiotics. Follow-up blood  And urine cultures Nebulized bronchodilators. VAP Protocol Blood glucose monitoring without any sliding scale insulin as patient was not taking any insulin at home Consider starting sliding-scale insulin if blood glucose levels are consistently greater than 200 mg/dL Hold home blood pressure medications in light of hypotension Central venous catheter placed, placement confirmed CVP monitoring every 4 hours 24 hours Monitor mental status closely   FAMILY  - Updates: No family at bedside. We'll updated when available  - Inter-disciplinary family meet or Palliative Care meeting due by:  day 7  Plan of care discussed with [facility] physician Dr. Halford Chessman and oncoming attending. Dr. Lacinda Axon S. [facility] ANP-BC Pulmonary and [facility] Pager [phone] or [phone] 01/16/2017, 1:56 AM

## 2017-01-16 NOTE — Progress Notes (Signed)
Initial Nutrition Assessment  DOCUMENTATION CODES:      INTERVENTION:     NUTRITION DIAGNOSIS:     related to   as evidenced by  .    GOAL:        MONITOR:      REASON FOR ASSESSMENT:   Ventilator    ASSESSMENT:      Patient is currently intubated on ventilator support MV: 9 L/min Temp (24hrs), Avg:97.3 F (36.3 C), Min:95.5 F (35.3 C), Max:99.3 F (37.4 C) Propofol: none   Diet Order:  Diet NPO time specified  Skin:  Reviewed, no issues  Last BM:  PTA  Height:   Ht Readings from Last 1 Encounters:  01/16/17 '5\' 8"'$  (1.727 m)    Weight:   Wt Readings from Last 1 Encounters:  01/16/17 159 lb 6.3 oz (72.3 kg)    Ideal Body Weight:  70 kg  BMI:  Body mass index is 24.24 kg/m.  Estimated Nutritional Needs:   Kcal:     Protein:     Fluid:     EDUCATION NEEDS:

## 2017-01-16 NOTE — Procedures (Signed)
Central Venous Catheter Insertion Procedure Note [name] [name] [id number] Oct 28, 1933  Procedure: Insertion of right Internal Jugular Central Venous Catheter Indications: Assessment of intravascular volume, Drug and/or fluid administration and Frequent blood sampling  Procedure Details Consent: Risks of procedure as well as the alternatives and risks of each were explained to the (patient/caregiver).  Consent for procedure obtained. and Unable to obtain consent because of emergent medical necessity. Time Out: Verified patient identification, verified procedure, site/side was marked, verified correct patient position, special equipment/implants available, medications/allergies/relevent history reviewed, required imaging and test results available.  Performed  Maximum sterile technique was used including antiseptics, cap, gloves, gown, hand hygiene, mask and sheet. Skin prep: Chlorhexidine; local anesthetic administered A antimicrobial bonded/coated triple lumen catheter was placed in the right internal jugular vein using the Seldinger technique.  Evaluation Blood flow good Complications: No apparent complications Patient did tolerate procedure well. Chest X-ray ordered to verify placement.  CXR: normal.  Procedure performed under direct supervision of Dr.Kasa. Ultrasound utilized for realtime vessel cannulation  [name] ANP-BC Pulmonary and Critical Care Medicine [facility] Pager [phone] or [phone]  01/16/2017, 5:05 AM

## 2017-01-16 NOTE — Progress Notes (Signed)
Tolerating extubation well. Denies dyspnea. Has expressed desire to be DNR to RN. We will discuss this further. He assures me that he did not take an intentional OD. He expresses a desire to establish pulmonary care in this community (recently moved from [facility]).   DC CVL and Foley cath Transfer to med-surg Advance activity snd diet Resume Advair (home med) Change nebs to PRN Counseled re: smoking cessation  I will see again 05/01  Merton Border, MD PCCM service Mobile [phone] Pager [phone] 01/16/2017 3:17 PM

## 2017-01-17 ENCOUNTER — Inpatient Hospital Stay: Payer: Medicare (Managed Care)

## 2017-01-17 LAB — CBC
HCT: 43.1 % (ref 40.0–52.0)
Hemoglobin: 14.2 g/dL (ref 13.0–18.0)
MCH: 29.7 pg (ref 26.0–34.0)
MCHC: 33 g/dL (ref 32.0–36.0)
MCV: 90 fL (ref 80.0–100.0)
PLATELETS: 201 10*3/uL (ref 150–440)
RBC: 4.78 MIL/uL (ref 4.40–5.90)
RDW: 14.4 % (ref 11.5–14.5)
WBC: 10.2 10*3/uL (ref 3.8–10.6)

## 2017-01-17 LAB — COMPREHENSIVE METABOLIC PANEL
ALBUMIN: 2.9 g/dL — AB (ref 3.5–5.0)
ALT: 11 U/L — ABNORMAL LOW (ref 17–63)
ANION GAP: 7 (ref 5–15)
AST: 10 U/L — AB (ref 15–41)
Alkaline Phosphatase: 54 U/L (ref 38–126)
BUN: 16 mg/dL (ref 6–20)
CHLORIDE: 104 mmol/L (ref 101–111)
CO2: 27 mmol/L (ref 22–32)
Calcium: 8.6 mg/dL — ABNORMAL LOW (ref 8.9–10.3)
Creatinine, Ser: 1.13 mg/dL (ref 0.61–1.24)
GFR calc Af Amer: >60 mL/min (ref >60)
GFR, EST NON AFRICAN AMERICAN: 59 mL/min — AB (ref >60)
Glucose, Bld: 80 mg/dL (ref 65–99)
POTASSIUM: 3.6 mmol/L (ref 3.5–5.1)
Sodium: 138 mmol/L (ref 135–145)
TOTAL PROTEIN: 5.6 g/dL — AB (ref 6.5–8.1)
Total Bilirubin: 0.9 mg/dL (ref 0.3–1.2)

## 2017-01-17 LAB — URINE CULTURE

## 2017-01-17 LAB — PROCALCITONIN

## 2017-01-17 MED ORDER — ESCITALOPRAM OXALATE 5 MG PO TABS: 5.0000 mg | ORAL_TABLET | Freq: Every day | ORAL | 0 refills | Status: DC

## 2017-01-17 MED ORDER — FLUTICASONE FUROATE-VILANTEROL 200-25 MCG/INH IN AEPB: 1.0000 | INHALATION_SPRAY | Freq: Every day | RESPIRATORY_TRACT | 1 refills | Status: DC

## 2017-01-17 MED ORDER — ACETAMINOPHEN 325 MG PO TABS
650.0000 mg | ORAL_TABLET | Freq: Four times a day (QID) | ORAL | Status: DC | PRN
  Administered 2017-01-17: 650 mg via ORAL
  Filled 2017-01-17: qty 2

## 2017-01-17 NOTE — Progress Notes (Signed)
PT Cancellation Note  Patient Details Name: [name] [name] MRN: [id number] DOB: 1934/05/25   Cancelled Treatment:    Reason Eval/Treat Not Completed: PT screened, no needs identified, will sign off   Kreg Shropshire, DPT 01/17/2017, 11:58 AM

## 2017-01-17 NOTE — Care Management Note (Signed)
Case Management Note  Patient Details  Name: [name] [name] MRN: [id number] Date of Birth: 07/02/1934  Subjective/Objective:  Spoke with patient regarding home health. He does not have a PCP in [place]. Encouraged him to get a PCP as soon as possible for hospital follow up. Explained why he would not qualify for home health at this time. He verbalized understanding.  He will be discharged home with niece.                  Action/Plan:   Expected Discharge Date:  01/17/17               Expected Discharge Plan:     In-House Referral:     Discharge planning Services  CM Consult  Post Acute Care Choice:    Choice offered to:     DME Arranged:    DME Agency:     HH Arranged:    HH Agency:     Status of Service:  Completed, signed off  If discussed at [employer] of Stay Meetings, dates discussed:    Additional Comments:  Jolly Mango, RN 01/17/2017, 11:48 AM

## 2017-01-17 NOTE — Care Management Important Message (Signed)
Important Message  Patient Details  Name: [name] [name] MRN: [id number] Date of Birth: [birth year]   Medicare Important Message Given:  Yes    Jolly Mango, RN 01/17/2017, 10:11 AM

## 2017-01-17 NOTE — Discharge Summary (Signed)
[facility] NAME: [name] [name]    MR#:  [id number]  DATE OF BIRTH:  May 08, 1934  DATE OF ADMISSION:  01/15/2017 ADMITTING PHYSICIAN: Saundra Shelling, MD  DATE OF DISCHARGE: 01/17/2017  PRIMARY CARE PHYSICIAN: Pcp Not In System    ADMISSION DIAGNOSIS:  Unresponsiveness [R41.89] Urinary tract infection without hematuria, site unspecified [N39.0] Community acquired pneumonia, unspecified laterality [J18.9]  DISCHARGE DIAGNOSIS:  Principal Problem:   Overdose of benzodiazepine Active Problems:   Respiratory failure ([place])   Unresponsiveness   HCAP (healthcare-associated pneumonia)   No HCAP-  R/o infection.  SECONDARY DIAGNOSIS:   Past Medical History:  Diagnosis Date  . ABSCESS 12/03/2009  . ABSCESS, FINGER 04/07/2010  . ANXIETY 11/03/2009  . ASTHMA 11/03/2009  . CHRONIC OBSTRUCTIVE PULMONARY DISEASE, ACUTE EXACERBATION 11/03/2009  . COPD 11/03/2009  . DEPRESSION 11/03/2009  . DIABETES MELLITUS, TYPE II 11/03/2009  . Jeannette DISEASE, LUMBAR 11/03/2009  . EMPHYSEMA, BULLOUS 11/03/2009  . GERD 11/03/2009  . HYPERLIPIDEMIA 11/03/2009  . HYPERTENSION 11/03/2009  . Kidney stones 01/30/12   "I've had them 7 times; always have passed them"  . PEPTIC ULCER DISEASE 11/03/2009  . Pneumonia   . RASH-NONVESICULAR 11/03/2009  . RESTLESS LEG SYNDROME 11/03/2009  . Shortness of breath    "sometimes; at any time"  . SPINAL STENOSIS, LUMBAR 11/03/2009    HOSPITAL COURSE:   * acute respi failure   Acute encephalopathy- likely toxic encephalopathy.      As per niece- he have suicidal tendency.   Urine dr screen positive for benzodiazepines.   Intubated, extubated- stable.   Psych evaluation appreciated, does not appear threat to self- and started on meds for depression.   Pt is completely alert and oriented today.   As per niece, he have social issues and was homeless for a while.  * Hyperlipidemia   Cont statin.  * hypertension   BP  stable without meds, hold lisinopril for now.  * COPD    No exacerbation, cont inhalers and nebs.  * Suspected sepsis on admission with pneumonia   But ruled out. Appears not infected, monitor.  DISCHARGE CONDITIONS:   Stable.  CONSULTS OBTAINED:  Treatment Team:  Wilhelmina Mcardle, MD Gonzella Lex, MD  DRUG ALLERGIES:  No Known Allergies  DISCHARGE MEDICATIONS:   Current Discharge Medication List    START taking these medications   Details  escitalopram (LEXAPRO) 5 MG tablet Take 1 tablet (5 mg total) by mouth daily. Qty: 30 tablet, Refills: 0    fluticasone furoate-vilanterol (BREO ELLIPTA) 200-25 MCG/INH AEPB Inhale 1 puff into the lungs daily. Qty: 60 each, Refills: 1      CONTINUE these medications which have NOT CHANGED   Details  albuterol (PROVENTIL HFA;VENTOLIN HFA) 108 (90 Base) MCG/ACT inhaler Inhale 1-2 puffs into the lungs every 6 (six) hours as needed for wheezing or shortness of breath.     aspirin EC 81 MG tablet Take 81 mg by mouth daily.    atorvastatin (LIPITOR) 40 MG tablet Take 1 tablet (40 mg total) by mouth daily. Qty: 30 tablet, Refills: 0    [employer]IE[employer] [facility] ST 325 MG CAPS Take 2 capsules by mouth every 6 (six) hours as needed. Refills: 0    meclizine (ANTIVERT) 12.5 MG tablet Take 1 tablet by mouth every 6 (six) hours as needed. Refills: 0    lisinopril (PRINIVIL,ZESTRIL) 10 MG tablet Take 10 mg by mouth daily.  STOP taking these medications     Fluticasone-Salmeterol (ADVAIR) 100-50 MCG/DOSE AEPB          DISCHARGE INSTRUCTIONS:    Follow with PMD in 1-2 weeks.  If you experience worsening of your admission symptoms, develop shortness of breath, life threatening emergency, suicidal or homicidal thoughts you must seek medical attention immediately by calling 911 or calling your MD immediately  if symptoms less severe.  You Must read complete instructions/literature along with all the possible adverse  reactions/side effects for all the Medicines you take and that have been prescribed to you. Take any new Medicines after you have completely understood and accept all the possible adverse reactions/side effects.   Please note  You were cared for by a hospitalist during your hospital stay. If you have any questions about your discharge medications or the care you received while you were in the hospital after you are discharged, you can call the unit and asked to speak with the hospitalist on call if the hospitalist that took care of you is not available. Once you are discharged, your primary care physician will handle any further medical issues. Please note that NO REFILLS for any discharge medications will be authorized once you are discharged, as it is imperative that you return to your primary care physician (or establish a relationship with a primary care physician if you do not have one) for your aftercare needs so that they can reassess your need for medications and monitor your lab values.    Today   CHIEF COMPLAINT:  No chief complaint on file.   HISTORY OF PRESENT ILLNESS:  [name] [name]  is a 83 y.o. male with a known history of COPD, type 2 diabetes mellitus, GERD, hyperlipidemia, hypertension, nephrolithiasis, peptic ulcer disease, restless leg syndrome, spinal stenosis was brought by the EMS for unresponsiveness . EMS was called by patient's niece because of decreased responsiveness. According to the family patient was confused around 7 PM in the evening and EMS was called. EMS found the patient to be in atrial fibrillation with decreased responsiveness. Upon arrival in the emergency room patient was intubated put on ventilator because he was not much responsive and very lethargic. Patient was worked up with a CT of the head and neck which did not show any acute intracranial abnormality. His chest x-ray showed bilateral pneumonia. Patients urine toxicology showed positive benzodiazepines.  Patient was intubated and put on ventilator and IV propofol drip for sedation. Hospitalist service was consulted for further care of the patient. Not much history could be obtained from the patient as he is on sedation with IV propofol drip and on ventilator.   VITAL SIGNS:  Blood pressure (!) 149/69, pulse 73, temperature 98.2 F (36.8 C), temperature source Oral, resp. rate 18, height '5\' 8"'$  (1.727 m), weight 72.3 kg (159 lb 6.3 oz), SpO2 97 %.  I/O:   Intake/Output Summary (Last 24 hours) at 01/17/17 0954 Last data filed at 01/16/17 1500  Gross per 24 hour  Intake              480 ml  Output              475 ml  Net                5 ml    PHYSICAL EXAMINATION:   GENERAL:  83 y.o.-year-old patient lying in the bed with no acute distress.  EYES: Pupils equal, round, reactive to light and accommodation. No scleral icterus. Extraocular  muscles intact.  HEENT: Head atraumatic, normocephalic. Oropharynx and nasopharynx clear.  NECK:  Supple, no jugular venous distention. No thyroid enlargement, no tenderness.  LUNGS: Normal breath sounds bilaterally, no wheezing, some crepitation. No use of accessory muscles of respiration.  CARDIOVASCULAR: S1, S2 normal. No murmurs, rubs, or gallops.  ABDOMEN: Soft, nontender, nondistended. Bowel sounds present. No organomegaly or mass.  EXTREMITIES: No pedal edema, cyanosis, or clubbing.  NEUROLOGIC: Cranial nerves II through XII are intact. Muscle strength 4/5 in all extremities. Sensation intact. Gait not checked.  PSYCHIATRIC: The patient is alert and oriented x 3.  SKIN: No obvious rash, lesion, or ulcer.   DATA REVIEW:   CBC  Recent Labs Lab 01/17/17 0529  WBC 10.2  HGB 14.2  HCT 43.1  PLT 201    Chemistries   Recent Labs Lab 01/17/17 0529  NA 138  K 3.6  CL 104  CO2 27  GLUCOSE 80  BUN 16  CREATININE 1.13  CALCIUM 8.6*  AST 10*  ALT 11*  ALKPHOS 54  BILITOT 0.9    Cardiac Enzymes  Recent Labs Lab 01/16/17 0501   TROPONINI <0.03    Microbiology Results  Results for orders placed or performed during the hospital encounter of 01/15/17  Urine culture     Status: Abnormal   Collection Time: 01/15/17  9:08 PM  Result Value Ref Range Status   Specimen Description URINE, RANDOM  Final   Special Requests NONE  Final   Culture MULTIPLE SPECIES PRESENT, SUGGEST RECOLLECTION (A)  Final   Report Status 01/17/2017 FINAL  Final  Blood culture (routine x 2)     Status: None (Preliminary result)   Collection Time: 01/15/17 11:44 PM  Result Value Ref Range Status   Specimen Description BLOOD RIGHT HAND  Final   Special Requests   Final    BOTTLES DRAWN AEROBIC AND ANAEROBIC Blood Culture adequate volume   Culture NO GROWTH 1 DAY  Final   Report Status PENDING  Incomplete  Blood culture (routine x 2)     Status: None (Preliminary result)   Collection Time: 01/15/17 11:56 PM  Result Value Ref Range Status   Specimen Description BLOOD RIGHT HAND  Final   Special Requests   Final    BOTTLES DRAWN AEROBIC AND ANAEROBIC Blood Culture results may not be optimal due to an inadequate volume of blood received in culture bottles   Culture NO GROWTH 1 DAY  Final   Report Status PENDING  Incomplete  MRSA PCR Screening     Status: None   Collection Time: 01/16/17 12:27 AM  Result Value Ref Range Status   MRSA by PCR NEGATIVE NEGATIVE Final    Comment:        The GeneXpert MRSA Assay (FDA approved for NASAL specimens only), is one component of a comprehensive MRSA colonization surveillance program. It is not intended to diagnose MRSA infection nor to guide or monitor treatment for MRSA infections.     RADIOLOGY:  Ct Angio Head W Or Wo Contrast  Result Date: 01/15/2017 CLINICAL DATA:  Found down by family, unresponsive and intubated. History of atrial fibrillation, hypertension, diabetes. EXAM: CT ANGIOGRAPHY HEAD AND NECK TECHNIQUE: Multidetector CT imaging of the head and neck was performed using the  standard protocol during bolus administration of intravenous contrast. Multiplanar CT image reconstructions and MIPs were obtained to evaluate the vascular anatomy. Carotid stenosis measurements (when applicable) are obtained utilizing NASCET criteria, using the distal internal carotid diameter as the denominator. CONTRAST:  75 cc Isovue 370 COMPARISON:  CT angiogram of the head and neck December 15, 2016 and CT HEAD January 15, 2017 at 2051 hours FINDINGS: CTA NECK AORTIC ARCH: Normal appearance of the thoracic arch, normal branch pattern. Mild calcific atherosclerosis of the aortic arch. The origins of the innominate, left Common carotid artery and subclavian artery are widely patent. RIGHT CAROTID SYSTEM: Common carotid artery is widely patent. Normal appearance of the carotid bifurcation without hemodynamically significant stenosis by NASCET criteria, mild calcific atherosclerosis. Normal appearance of the included internal carotid artery. LEFT CAROTID SYSTEM: Common carotid artery is widely patent. Normal appearance of the carotid bifurcation without hemodynamically significant stenosis by NASCET criteria, mild calcific atherosclerosis. Normal appearance of the included internal carotid artery. VERTEBRAL ARTERIES:Codominant vertebral artery's. Normal appearance of the vertebral arteries, which appear widely patent. SKELETON: No acute osseous process though bone windows have not been submitted. Patient is edentulous and intubated, tip above the carina with debris layering trachea. Mild fluid distended esophagus with nasogastric tube. Multi level moderate to severe degenerative change of the cervical spine without acute osseous process. OTHER NECK: Soft tissues of the neck are non-acute though, not tailored for evaluation. Severe emphysema and apical bullous changes. CTA HEAD ANTERIOR CIRCULATION: Patent cervical internal carotid arteries, petrous, cavernous and supra clinoid internal carotid arteries. Widely patent  anterior communicating artery. Patent anterior and middle cerebral arteries. No large vessel occlusion, hemodynamically significant stenosis, dissection, luminal irregularity, contrast extravasation or aneurysm. POSTERIOR CIRCULATION: Patent vertebral arteries, vertebrobasilar junction and basilar artery, as well as main branch vessels. Patent posterior cerebral arteries. No large vessel occlusion, hemodynamically significant stenosis, dissection, luminal irregularity, contrast extravasation or aneurysm. VENOUS SINUSES: Major dural venous sinuses are patent though not tailored for evaluation on this angiographic examination. ANATOMIC VARIANTS: None. DELAYED PHASE: No abnormal intracranial enhancement. MIP images reviewed. IMPRESSION: CTA NECK: Stable examination: Mild atherosclerosis without hemodynamically significant stenosis nor acute vascular process. Intubation, debris layering within the trachea. CTA HEAD: Stable examination: No emergent large vessel occlusion or severe stenosis. Electronically Signed   By: Elon Alas M.D.   On: 01/15/2017 22:23   Dg Chest 1 View  Result Date: 01/16/2017 CLINICAL DATA:  Central line placement. EXAM: CHEST 1 VIEW COMPARISON:  Chest radiograph January 15, 2017 FINDINGS: New RIGHT internal jugular central venous catheter distal tip projects in proximal superior vena cava. Endotracheal tube tip projects 5.4 cm above the carina. Nasogastric tube past proximal stomach, distal tip not imaged. No pneumothorax. Cardiac silhouette is mildly enlarged and unchanged. Mediastinal silhouette is nonsuspicious, calcified aortic knob. LEFT apical bullous changes in chronic interstitial prominence, pulmonary hyperinflation. Strandy densities in lung bases. No pleural effusion. Soft tissue planes included osseous structures are unchanged. IMPRESSION: New RIGHT internal jugular central venous catheter distal tip projects in proximal superior vena cava. No apparent change in remaining life  support lines. COPD with LEFT apical bullous changes and bibasilar atelectasis, there could be a component pneumonia. Electronically Signed   By: Elon Alas M.D.   On: 01/16/2017 05:29   Dg Abdomen 1 View  Result Date: 01/15/2017 CLINICAL DATA:  Evaluate OG tube placement EXAM: ABDOMEN - 1 VIEW COMPARISON:  None. FINDINGS: The OG tube terminates in the left upper quadrant of the abdomen. The side port is in the fundus. The distal tip has flipped back on itself just beneath the GE junction. IMPRESSION: The OG tube terminates in the stomach. The side port is in the fundus. The distal tip has flipped back on itself and  terminates just beneath the GE junction. Electronically Signed   By: Dorise Bullion III M.D   On: 01/15/2017 21:33   Ct Head Wo Contrast  Result Date: 01/15/2017 CLINICAL DATA:  Patient found unresponsive. EXAM: CT HEAD WITHOUT CONTRAST TECHNIQUE: Contiguous axial images were obtained from the base of the skull through the vertex without intravenous contrast. COMPARISON:  December 15, 2016 FINDINGS: Brain: No subdural, epidural, or subarachnoid hemorrhage. No mass effect or midline shift. Cerebellum, brainstem, and basal cisterns are normal. Ventricles and sulci are unremarkable. No acute cortical ischemia or infarct. Vascular: No hyperdense vessel or unexpected calcification. Skull: Normal. Negative for fracture or focal lesion. Sinuses/Orbits: No acute finding. Other: None. IMPRESSION: No acute abnormalities. Electronically Signed   By: Dorise Bullion III M.D   On: 01/15/2017 21:13   Ct Angio Neck W And/or Wo Contrast  Result Date: 01/15/2017 CLINICAL DATA:  Found down by family, unresponsive and intubated. History of atrial fibrillation, hypertension, diabetes. EXAM: CT ANGIOGRAPHY HEAD AND NECK TECHNIQUE: Multidetector CT imaging of the head and neck was performed using the standard protocol during bolus administration of intravenous contrast. Multiplanar CT image reconstructions  and MIPs were obtained to evaluate the vascular anatomy. Carotid stenosis measurements (when applicable) are obtained utilizing NASCET criteria, using the distal internal carotid diameter as the denominator. CONTRAST:  75 cc Isovue 370 COMPARISON:  CT angiogram of the head and neck December 15, 2016 and CT HEAD January 15, 2017 at 2051 hours FINDINGS: CTA NECK AORTIC ARCH: Normal appearance of the thoracic arch, normal branch pattern. Mild calcific atherosclerosis of the aortic arch. The origins of the innominate, left Common carotid artery and subclavian artery are widely patent. RIGHT CAROTID SYSTEM: Common carotid artery is widely patent. Normal appearance of the carotid bifurcation without hemodynamically significant stenosis by NASCET criteria, mild calcific atherosclerosis. Normal appearance of the included internal carotid artery. LEFT CAROTID SYSTEM: Common carotid artery is widely patent. Normal appearance of the carotid bifurcation without hemodynamically significant stenosis by NASCET criteria, mild calcific atherosclerosis. Normal appearance of the included internal carotid artery. VERTEBRAL ARTERIES:Codominant vertebral artery's. Normal appearance of the vertebral arteries, which appear widely patent. SKELETON: No acute osseous process though bone windows have not been submitted. Patient is edentulous and intubated, tip above the carina with debris layering trachea. Mild fluid distended esophagus with nasogastric tube. Multi level moderate to severe degenerative change of the cervical spine without acute osseous process. OTHER NECK: Soft tissues of the neck are non-acute though, not tailored for evaluation. Severe emphysema and apical bullous changes. CTA HEAD ANTERIOR CIRCULATION: Patent cervical internal carotid arteries, petrous, cavernous and supra clinoid internal carotid arteries. Widely patent anterior communicating artery. Patent anterior and middle cerebral arteries. No large vessel occlusion,  hemodynamically significant stenosis, dissection, luminal irregularity, contrast extravasation or aneurysm. POSTERIOR CIRCULATION: Patent vertebral arteries, vertebrobasilar junction and basilar artery, as well as main branch vessels. Patent posterior cerebral arteries. No large vessel occlusion, hemodynamically significant stenosis, dissection, luminal irregularity, contrast extravasation or aneurysm. VENOUS SINUSES: Major dural venous sinuses are patent though not tailored for evaluation on this angiographic examination. ANATOMIC VARIANTS: None. DELAYED PHASE: No abnormal intracranial enhancement. MIP images reviewed. IMPRESSION: CTA NECK: Stable examination: Mild atherosclerosis without hemodynamically significant stenosis nor acute vascular process. Intubation, debris layering within the trachea. CTA HEAD: Stable examination: No emergent large vessel occlusion or severe stenosis. Electronically Signed   By: Elon Alas M.D.   On: 01/15/2017 22:23   Dg Chest Port 1 View  Result Date: 01/17/2017  CLINICAL DATA:  Respiratory failure, history of COPD, diabetes, healthcare associated pneumonia, benzodiazepine overdose EXAM: PORTABLE CHEST 1 VIEW COMPARISON:  Portable chest x-ray of January 16, 2017 FINDINGS: The lungs remain hyperinflated. The left lung is hyperlucent with respect to the right. There is no pneumothorax or pneumomediastinum. The heart is normal in size. The pulmonary vascularity is not engorged. The interstitial markings remain coarse throughout much of the right lung and at the left lung base. The right internal jugular venous catheter is been removed as have the endotracheal and esophagogastric tubes. IMPRESSION: Bullous emphysema. Mild stable interstitial prominence bilaterally may reflect interstitial edema or pneumonia or chronic fibrotic change. Electronically Signed   By: David  Martinique M.D.   On: 01/17/2017 07:18   Dg Chest Portable 1 View  Result Date: 01/15/2017 CLINICAL DATA:  ETT  placement EXAM: PORTABLE CHEST 1 VIEW COMPARISON:  Multiple chest x-rays since November 13, 2016 FINDINGS: The ETT is in good position. The distal NG tube is not well seen. Stable cardiomediastinal silhouette. Increasing opacities in the lungs, diffuse on the right more focal in the left base. IMPRESSION: Increasing opacities in the lungs, diffuse on the right and more focal in the left base may represent edema or developing multifocal infection. Recommend clinical correlation and attention on follow-up. The ETT is in good position. Electronically Signed   By: Dorise Bullion III M.D   On: 01/15/2017 21:33    EKG:   Orders placed or performed during the hospital encounter of 01/15/17  . EKG 12-Lead  . EKG 12-Lead  . ED EKG  . ED EKG      Management plans discussed with the patient, family and they are in agreement.  CODE STATUS:     Code Status Orders        Start     Ordered   01/15/17 2242  Full code  Continuous     01/15/17 2243    Code Status History    Date Active Date Inactive Code Status Order ID Comments User Context   12/15/2016  8:54 PM 12/16/2016  9:21 PM Full Code [id number]  Dustin Flock, MD Inpatient   12/15/2016  6:40 PM 12/15/2016  8:54 PM DNR [id number]  Dustin Flock, MD ED   May 05, 202018  9:22 PM 11/02/2016  5:40 PM DNR [id number]  Loletha Grayer, MD ED   03/17/2012  5:42 AM 03/19/2012  8:14 PM Full Code [id number]  Marylou Mccoy, RN Inpatient   01/30/2012  4:56 PM 02/03/2012  6:59 PM Full Code [id number]  Murlean Iba, MD Inpatient   08/11/2011  6:03 PM 08/13/2011  6:35 PM Full Code [id number]  Loyal Gambler, RN Inpatient      TOTAL TIME TAKING CARE OF THIS PATIENT: 35 minutes.    Vaughan Basta M.D on 01/17/2017 at 9:54 AM  Between 7am to 6pm - Pager - [phone]  After 6pm go to [email or web address] - password [email or web address] [facility]  Office  [phone]  CC: Primary care physician; Pcp Not In System   Note: This dictation was  prepared with Dragon dictation along with smaller phrase technology. Any transcriptional errors that result from this process are unintentional.

## 2017-01-17 NOTE — Plan of Care (Signed)
Problem: Safety: Goal: Ability to remain free from injury will improve Outcome: Completed/Met Date Met: 01/17/17 Patient has been free from injury. RN reoriented patient to call bell system. Patient using walker to ambulate. Standby assist.  Deri Fuelling, RN

## 2017-01-17 NOTE — Clinical Social Work Note (Signed)
Clinical Social Work Assessment  Patient Details  Name: [name] [name] MRN: [id number] Date of Birth: [birth year]  Date of referral:  01/17/17               Reason for consult:  Community Resources, Family Concerns, Housing Concerns/Homelessness                Permission sought to share information with:  Case Optician, dispensing granted to share information::  Yes, Verbal Permission Granted  Name::        Agency::     Relationship::     Contact Information:     Housing/Transportation Living arrangements for the past 2 months:  No permanent address Source of Information:  Patient, Other (Comment Required) (Niece/ friend Production manager ) Patient Interpreter Needed:  None Criminal Activity/Legal Involvement Pertinent to Current Situation/Hospitalization:  No - Comment as needed Significant Relationships:  Adult Children, Other Family Members Lives with:  Other (Comment) (Niece/ friend [name] [name] ) Do you feel safe going back to the place where you live?  Yes Need for family participation in patient care:  Yes (Comment)  Care giving concerns:  Patient is currently living with his niece/ friend [name] [name] in [place], [place].    Social Worker assessment / plan:  Holiday representative ([place]) received verbal consult from MD about social concerns. Per MD patient has lived in [place] and now is living in [place] with his niece who wants to discuss alternate housing options for patient. Per MD patient is medically stable for D/C today. CSW met with patient and his niece/ friend [name] [name] at bedside. Patient was fully dressed wearing pants and a shirt and was walking independently in the room. CSW introduced self and explained role of CSW department. Patient was alert and oriented X4. [name] [name] reported that patient has been living with her in [place] for the past 6 months. Per [name] [name] patient was staying in [place] with his sons before he came to [facility]. [name] [name] reported that she is moving in the next month and patient will have no where  to go. Per [name] [name] patient can come to her house today until he gets back on his feet but he will have to move within a month. Patient reported that he does not want to go back to [place] and might go stay in [place], [place]. Patient reported that he is not going to a nursing home, assisted living or group home. Patient reported that he is not willing to give up his social security check. CSW provided patient and [name] [name] with a list of [employer] including housing options. Patient and [name] [name] reported no other needs at this time. RN aware of above. Please reconsult if future social work needs arise. CSW signing off.    Employment status:  Disabled (Comment on whether or not currently receiving Disability), Retired Nurse, adult PT Recommendations:  No Follow Up Information / Referral to community resources:  Other (Comment Required) [facility])  Patient/Family's Response to care:  Patient is agreeable to going home to [name] [name]'s house today.   Patient/Family's Understanding of and Emotional Response to Diagnosis, Current Treatment, and Prognosis:  Patient was pleasant and thanked CSW for visit.   Emotional Assessment Appearance:  Appears stated age Attitude/Demeanor/Rapport:    Affect (typically observed):  Stable, Adaptable, Guarded, Pleasant Orientation:  Oriented to Self, Oriented to Place, Oriented to  Time, Oriented to Situation Alcohol / Substance use:  Other Psych involvement (Current and /or in the community):  Yes (  Comment) (Psych is following patient while at [facility])  Discharge Needs  Concerns to be addressed:  No discharge needs identified Readmission within the last 30 days:  No Current discharge risk:  Other (Risk of homelessness) Barriers to Discharge:  No Barriers Identified   Carlie Corpus, Veronia Beets, LCSW 01/17/2017, 12:04 PM

## 2017-01-17 NOTE — Progress Notes (Signed)
Sharlynn Oliphant to be D/C'd Home per MD order.  Discussed with the patient and all questions fully answered.  VSS, Skin clean, dry and intact without evidence of skin break down, no evidence of skin tears noted. IV catheter discontinued intact. Site without signs and symptoms of complications. Dressing and pressure applied.  An After Visit Summary was printed and given to the patient. Patient received prescription.  D/c education completed with patient/family including follow up instructions, medication list, d/c activities limitations if indicated, with other d/c instructions as indicated by MD - patient able to verbalize understanding, all questions fully answered.   Patient instructed to return to ED, call 911, or call MD for any changes in condition.   Patient escorted by niece to home.  Deri Fuelling 01/17/2017 12:03 PM

## 2017-01-21 LAB — CULTURE, BLOOD (ROUTINE X 2)
CULTURE: NO GROWTH
Culture: NO GROWTH
Special Requests: ADEQUATE

## 2017-02-21 ENCOUNTER — Encounter: Payer: Self-pay | Admitting: Emergency Medicine

## 2017-02-21 ENCOUNTER — Emergency Department: Payer: Medicare (Managed Care)

## 2017-02-21 ENCOUNTER — Inpatient Hospital Stay
Admission: EM | Admit: 2017-02-21 | Discharge: 2017-02-21 | DRG: 190 | Discharge status: Against Medical Advice | Payer: Medicare (Managed Care) | Attending: Internal Medicine | Admitting: Internal Medicine

## 2017-02-21 DIAGNOSIS — G2581 Restless legs syndrome: Secondary | ICD-10-CM | POA: Diagnosis present

## 2017-02-21 DIAGNOSIS — E785 Hyperlipidemia, unspecified: Secondary | ICD-10-CM | POA: Diagnosis present

## 2017-02-21 DIAGNOSIS — K219 Gastro-esophageal reflux disease without esophagitis: Secondary | ICD-10-CM | POA: Diagnosis present

## 2017-02-21 DIAGNOSIS — Z87442 Personal history of urinary calculi: Secondary | ICD-10-CM | POA: Diagnosis not present

## 2017-02-21 DIAGNOSIS — Z8711 Personal history of peptic ulcer disease: Secondary | ICD-10-CM

## 2017-02-21 DIAGNOSIS — F17210 Nicotine dependence, cigarettes, uncomplicated: Secondary | ICD-10-CM | POA: Diagnosis present

## 2017-02-21 DIAGNOSIS — F419 Anxiety disorder, unspecified: Secondary | ICD-10-CM | POA: Diagnosis present

## 2017-02-21 DIAGNOSIS — F329 Major depressive disorder, single episode, unspecified: Secondary | ICD-10-CM | POA: Diagnosis present

## 2017-02-21 DIAGNOSIS — R0603 Acute respiratory distress: Secondary | ICD-10-CM

## 2017-02-21 DIAGNOSIS — J440 Chronic obstructive pulmonary disease with acute lower respiratory infection: Secondary | ICD-10-CM | POA: Diagnosis present

## 2017-02-21 DIAGNOSIS — Z7982 Long term (current) use of aspirin: Secondary | ICD-10-CM

## 2017-02-21 DIAGNOSIS — Z79899 Other long term (current) drug therapy: Secondary | ICD-10-CM | POA: Diagnosis not present

## 2017-02-21 DIAGNOSIS — E119 Type 2 diabetes mellitus without complications: Secondary | ICD-10-CM | POA: Diagnosis present

## 2017-02-21 DIAGNOSIS — R0602 Shortness of breath: Secondary | ICD-10-CM | POA: Diagnosis present

## 2017-02-21 DIAGNOSIS — I10 Essential (primary) hypertension: Secondary | ICD-10-CM | POA: Diagnosis present

## 2017-02-21 DIAGNOSIS — J441 Chronic obstructive pulmonary disease with (acute) exacerbation: Secondary | ICD-10-CM | POA: Diagnosis not present

## 2017-02-21 DIAGNOSIS — Z7951 Long term (current) use of inhaled steroids: Secondary | ICD-10-CM

## 2017-02-21 DIAGNOSIS — Z66 Do not resuscitate: Secondary | ICD-10-CM | POA: Diagnosis present

## 2017-02-21 DIAGNOSIS — Z8249 Family history of ischemic heart disease and other diseases of the circulatory system: Secondary | ICD-10-CM

## 2017-02-21 DIAGNOSIS — Y95 Nosocomial condition: Secondary | ICD-10-CM | POA: Diagnosis present

## 2017-02-21 DIAGNOSIS — J189 Pneumonia, unspecified organism: Secondary | ICD-10-CM | POA: Diagnosis present

## 2017-02-21 LAB — BASIC METABOLIC PANEL
Anion gap: 8 (ref 5–15)
BUN: 14 mg/dL (ref 6–20)
CHLORIDE: 104 mmol/L (ref 101–111)
CO2: 28 mmol/L (ref 22–32)
Calcium: 9.2 mg/dL (ref 8.9–10.3)
Creatinine, Ser: 0.85 mg/dL (ref 0.61–1.24)
GFR calc Af Amer: >60 mL/min (ref >60)
GFR calc non Af Amer: >60 mL/min (ref >60)
Glucose, Bld: 103 mg/dL — ABNORMAL HIGH (ref 65–99)
POTASSIUM: 3.5 mmol/L (ref 3.5–5.1)
SODIUM: 140 mmol/L (ref 135–145)

## 2017-02-21 LAB — LACTIC ACID, PLASMA: Lactic Acid, Venous: 1.5 mmol/L (ref 0.5–1.9)

## 2017-02-21 LAB — CBC
HEMATOCRIT: 46.9 % (ref 40.0–52.0)
HEMOGLOBIN: 16.1 g/dL (ref 13.0–18.0)
MCH: 30.6 pg (ref 26.0–34.0)
MCHC: 34.3 g/dL (ref 32.0–36.0)
MCV: 89.1 fL (ref 80.0–100.0)
Platelets: 259 10*3/uL (ref 150–440)
RBC: 5.27 MIL/uL (ref 4.40–5.90)
RDW: 13.9 % (ref 11.5–14.5)
WBC: 9 10*3/uL (ref 3.8–10.6)

## 2017-02-21 LAB — MRSA PCR SCREENING: MRSA by PCR: NEGATIVE

## 2017-02-21 LAB — TROPONIN I: Troponin I: <0.03 ng/mL (ref <0.03)

## 2017-02-21 MED ORDER — DEXTROSE 5 % IV SOLN
2.0000 g | Freq: Once | INTRAVENOUS | Status: AC
  Administered 2017-02-21: 2 g via INTRAVENOUS
  Filled 2017-02-21: qty 2

## 2017-02-21 MED ORDER — HYDROCOD POLST-CPM POLST ER 10-8 MG/5ML PO SUER
5.0000 mL | Freq: Once | ORAL | Status: AC
  Administered 2017-02-21: 5 mL via ORAL
  Filled 2017-02-21: qty 5

## 2017-02-21 MED ORDER — ACETAMINOPHEN 325 MG PO TABS: 650.0000 mg | ORAL_TABLET | Freq: Four times a day (QID) | ORAL | Status: DC | PRN

## 2017-02-21 MED ORDER — MECLIZINE HCL 25 MG PO TABS: 12.5000 mg | ORAL_TABLET | Freq: Three times a day (TID) | ORAL | Status: DC | PRN

## 2017-02-21 MED ORDER — ENOXAPARIN SODIUM 40 MG/0.4ML ~~LOC~~ SOLN
40.0000 mg | SUBCUTANEOUS | Status: DC
  Administered 2017-02-21: 40 mg via SUBCUTANEOUS
  Filled 2017-02-21: qty 0.4

## 2017-02-21 MED ORDER — VANCOMYCIN HCL IN DEXTROSE 1-5 GM/200ML-% IV SOLN
1000.0000 mg | Freq: Two times a day (BID) | INTRAVENOUS | Status: DC
  Filled 2017-02-21 (×2): qty 200

## 2017-02-21 MED ORDER — ONDANSETRON HCL 4 MG/2ML IJ SOLN: 4.0000 mg | Freq: Four times a day (QID) | INTRAMUSCULAR | Status: DC | PRN

## 2017-02-21 MED ORDER — SODIUM CHLORIDE 0.9% FLUSH
3.0000 mL | Freq: Two times a day (BID) | INTRAVENOUS | Status: DC
  Administered 2017-02-21: 3 mL via INTRAVENOUS

## 2017-02-21 MED ORDER — SODIUM CHLORIDE 0.9 % IV BOLUS (SEPSIS)
250.0000 mL | Freq: Once | INTRAVENOUS | Status: AC
  Administered 2017-02-21: 250 mL via INTRAVENOUS

## 2017-02-21 MED ORDER — GUAIFENESIN ER 600 MG PO TB12
600.0000 mg | ORAL_TABLET | Freq: Two times a day (BID) | ORAL | Status: DC
  Administered 2017-02-21: 600 mg via ORAL
  Filled 2017-02-21: qty 1

## 2017-02-21 MED ORDER — SODIUM CHLORIDE 0.9% FLUSH: 3.0000 mL | INTRAVENOUS | Status: DC | PRN

## 2017-02-21 MED ORDER — SODIUM CHLORIDE 0.9 % IV BOLUS (SEPSIS)
1000.0000 mL | Freq: Once | INTRAVENOUS | Status: AC
  Administered 2017-02-21: 1000 mL via INTRAVENOUS

## 2017-02-21 MED ORDER — NICOTINE 21 MG/24HR TD PT24
21.0000 mg | MEDICATED_PATCH | Freq: Every day | TRANSDERMAL | Status: DC
  Administered 2017-02-21: 21 mg via TRANSDERMAL
  Filled 2017-02-21: qty 1

## 2017-02-21 MED ORDER — ACETAMINOPHEN 650 MG RE SUPP: 650.0000 mg | Freq: Four times a day (QID) | RECTAL | Status: DC | PRN

## 2017-02-21 MED ORDER — IPRATROPIUM-ALBUTEROL 0.5-2.5 (3) MG/3ML IN SOLN
3.0000 mL | Freq: Once | RESPIRATORY_TRACT | Status: AC
  Administered 2017-02-21: 3 mL via RESPIRATORY_TRACT
  Filled 2017-02-21: qty 3

## 2017-02-21 MED ORDER — METHYLPREDNISOLONE SODIUM SUCC 125 MG IJ SOLR
60.0000 mg | Freq: Four times a day (QID) | INTRAMUSCULAR | Status: DC
  Administered 2017-02-21: 60 mg via INTRAVENOUS
  Filled 2017-02-21: qty 2

## 2017-02-21 MED ORDER — SODIUM CHLORIDE 0.9 % IV SOLN
250.0000 mL | INTRAVENOUS | Status: DC | PRN
Start: 2017-02-21 — End: 2017-02-21

## 2017-02-21 MED ORDER — ASPIRIN EC 81 MG PO TBEC
81.0000 mg | DELAYED_RELEASE_TABLET | Freq: Every day | ORAL | Status: DC
  Administered 2017-02-21: 81 mg via ORAL
  Filled 2017-02-21: qty 1

## 2017-02-21 MED ORDER — ONDANSETRON HCL 4 MG PO TABS: 4.0000 mg | ORAL_TABLET | Freq: Four times a day (QID) | ORAL | Status: DC | PRN

## 2017-02-21 MED ORDER — ATORVASTATIN CALCIUM 20 MG PO TABS
40.0000 mg | ORAL_TABLET | Freq: Every day | ORAL | Status: DC
  Administered 2017-02-21: 40 mg via ORAL
  Filled 2017-02-21: qty 2

## 2017-02-21 MED ORDER — VANCOMYCIN HCL IN DEXTROSE 1-5 GM/200ML-% IV SOLN
1000.0000 mg | Freq: Once | INTRAVENOUS | Status: AC
  Administered 2017-02-21: 1000 mg via INTRAVENOUS
  Filled 2017-02-21: qty 200

## 2017-02-21 MED ORDER — DEXTROSE 5 % IV SOLN
2.0000 g | Freq: Three times a day (TID) | INTRAVENOUS | Status: DC
  Filled 2017-02-21 (×2): qty 2

## 2017-02-21 MED ORDER — ESCITALOPRAM OXALATE 10 MG PO TABS
5.0000 mg | ORAL_TABLET | Freq: Every day | ORAL | Status: DC
  Administered 2017-02-21: 5 mg via ORAL
  Filled 2017-02-21: qty 1

## 2017-02-21 MED ORDER — IPRATROPIUM-ALBUTEROL 0.5-2.5 (3) MG/3ML IN SOLN: 3.0000 mL | Freq: Four times a day (QID) | RESPIRATORY_TRACT | Status: DC

## 2017-02-21 MED ORDER — BUDESONIDE 0.25 MG/2ML IN SUSP
0.2500 mg | Freq: Two times a day (BID) | RESPIRATORY_TRACT | Status: DC
  Administered 2017-02-21: 0.25 mg via RESPIRATORY_TRACT
  Filled 2017-02-21: qty 2

## 2017-02-21 NOTE — H&P (Signed)
[facility] [place] NAME: [name] [name]    MR#:  [id number]  DATE OF BIRTH:  1934/07/08  DATE OF ADMISSION:  02/21/2017  PRIMARY CARE PHYSICIAN: System, Pcp Not In   REQUESTING/REFERRING PHYSICIAN: Lurline Hare MD  CHIEF COMPLAINT:   Chief Complaint  Patient presents with  . Shortness of Breath    HISTORY OF PRESENT ILLNESS: [name] [name]  is a 83 y.o. male with a known history of COPD, GERD, hyperlipidemia, essential hypertension who was recently hospitalized with overdose. And discharge and discharged without oxygen presents with shortness of breath cough and wheezing. Patient reports that his breathing started getting worse yesterday was gasping for air. Patient presented to the emergency room and was noted to have a possible pneumonia and COPD exacerbation. He denies any fevers chills complains of dry cough. Also complains of wheezing complains of chest tightness but no chest pain. Denies any nausea vomiting or diarrhea or lower extremity swelling     PAST MEDICAL HISTORY:   Past Medical History:  Diagnosis Date  . ABSCESS 12/03/2009  . ABSCESS, FINGER 04/07/2010  . ANXIETY 11/03/2009  . ASTHMA 11/03/2009  . CHRONIC OBSTRUCTIVE PULMONARY DISEASE, ACUTE EXACERBATION 11/03/2009  . COPD 11/03/2009  . DEPRESSION 11/03/2009  . DIABETES MELLITUS, TYPE II 11/03/2009  . [place] DISEASE, LUMBAR 11/03/2009  . EMPHYSEMA, BULLOUS 11/03/2009  . GERD 11/03/2009  . HYPERLIPIDEMIA 11/03/2009  . HYPERTENSION 11/03/2009  . Kidney stones 01/30/12   "I've had them 7 times; always have passed them"  . PEPTIC ULCER DISEASE 11/03/2009  . Pneumonia   . RASH-NONVESICULAR 11/03/2009  . RESTLESS LEG SYNDROME 11/03/2009  . Shortness of breath    "sometimes; at any time"  . SPINAL STENOSIS, LUMBAR 11/03/2009    PAST SURGICAL HISTORY: Past Surgical History:  Procedure Laterality Date  . [place]   left  . INGUINAL HERNIA REPAIR  10/2011   left  . ROTATOR CUFF  REPAIR  2003   left  . TONSILLECTOMY  1960    SOCIAL HISTORY:  Social History  Substance Use Topics  . Smoking status: Current Every Day Smoker    Packs/day: 1.00    Years: 41.00    Types: Cigarettes  . Smokeless tobacco: Never Used     Comment: "stopped smoking 04/21/1991 then restarted in 2012"  . Alcohol use No    FAMILY HISTORY:  Family History  Problem Relation Age of Onset  . Heart disease Father   . Heart disease Mother   . Cancer Brother        lung    DRUG ALLERGIES: No Known Allergies  REVIEW OF SYSTEMS:   CONSTITUTIONAL: No fever, fatigue or weakness.  EYES: No blurred or double vision.  EARS, NOSE, AND THROAT: No tinnitus or ear pain.  RESPIRATORY:Positiveough,  positive shortness of breath,  positive wheezing or hemoptysis.  CARDIOVASCULAR: No chest pain, orthopnea, edema.  GASTROINTESTINAL: No nausea, vomiting, diarrhea or abdominal pain.  GENITOURINARY: No dysuria, hematuria.  ENDOCRINE: No polyuria, nocturia,  HEMATOLOGY: No anemia, easy bruising or bleeding SKIN: No rash or lesion. MUSCULOSKELETAL: No joint pain or arthritis.   NEUROLOGIC: No tingling, numbness, weakness.  PSYCHIATRY: No anxiety or depression.   MEDICATIONS AT HOME:  Prior to Admission medications   Medication Sig Start Date End Date Taking? Authorizing Provider  albuterol (PROVENTIL HFA;VENTOLIN HFA) 108 (90 Base) MCG/ACT inhaler Inhale 1-2 puffs into the lungs every 6 (six) hours as needed for wheezing or  shortness of breath.  06/03/13  Yes [provider]  aspirin EC 81 MG tablet Take 81 mg by mouth daily.   Yes [provider]  atorvastatin (LIPITOR) 40 MG tablet Take 1 tablet (40 mg total) by mouth daily. 12/16/16  Yes Mody, Ulice Bold, MD  [facility] 325 MG CAPS Take 2 capsules by mouth every 6 (six) hours as needed. 12/21/16  Yes [provider]  escitalopram (LEXAPRO) 5 MG tablet Take 1 tablet (5 mg total) by mouth daily. 01/18/17  Yes Vaughan Basta, MD  fluticasone furoate-vilanterol (BREO ELLIPTA) 200-25 MCG/INH AEPB Inhale 1 puff into the lungs daily. 01/18/17  Yes Vaughan Basta, MD  meclizine (ANTIVERT) 12.5 MG tablet Take 1 tablet by mouth every 6 (six) hours as needed. 12/21/16  Yes [provider]      PHYSICAL EXAMINATION:   VITAL SIGNS: Blood pressure (!) 155/92, pulse 87, temperature 97.4 F (36.3 C), temperature source Oral, resp. rate (!) 25, height 5\' 11"  (1.803 m), weight 160 lb (72.6 kg), SpO2 96 %.  GENERAL:  83 y.o.-year-old patient lying in the bed with no acute distress.  EYES: Pupils equal, round, reactive to light and accommodation. No scleral icterus. Extraocular muscles intact.  HEENT: Head atraumatic, normocephalic. Oropharynx and nasopharynx clear.  NECK:  Supple, no jugular venous distention. No thyroid enlargement, no tenderness.  LUNGS:  bilateral wheezing throughout both lung  CARDIOVASCULAR: S1, S2 normal. No murmurs, rubs, or gallops.  ABDOMEN: Soft, nontender, nondistended. Bowel sounds present. No organomegaly or mass.  EXTREMITIES: No pedal edema, cyanosis, or clubbing.  NEUROLOGIC: Cranial nerves II through XII are intact. Muscle strength 5/5 in all extremities. Sensation intact. Gait not checked.  PSYCHIATRIC: The patient is alert and oriented x 3.  SKIN: No obvious rash, lesion, or ulcer.   LABORATORY PANEL:   CBC  Recent Labs Lab 02/21/17 0605  WBC 9.0  HGB 16.1  HCT 46.9  PLT 259  MCV 89.1  MCH 30.6  MCHC 34.3  RDW 13.9   ------------------------------------------------------------------------------------------------------------------  Chemistries   Recent Labs Lab 02/21/17 0605  NA 140  K 3.5  CL 104  CO2 28  GLUCOSE 103*  BUN 14  CREATININE 0.85  CALCIUM 9.2   ------------------------------------------------------------------------------------------------------------------ estimated creatinine clearance is 71.2 mL/min (by C-G formula based  on SCr of 0.85 mg/dL). ------------------------------------------------------------------------------------------------------------------ No results for input(s): TSH, T4TOTAL, T3FREE, THYROIDAB in the last 72 hours.  Invalid input(s): FREET3   Coagulation profile No results for input(s): INR, PROTIME in the last 168 hours. ------------------------------------------------------------------------------------------------------------------- No results for input(s): DDIMER in the last 72 hours. -------------------------------------------------------------------------------------------------------------------  Cardiac Enzymes  Recent Labs Lab 02/21/17 0605  TROPONINI <0.03   ------------------------------------------------------------------------------------------------------------------ Invalid input(s): POCBNP  ---------------------------------------------------------------------------------------------------------------  Urinalysis    Component Value Date/Time   COLORURINE YELLOW (A) 01/15/2017 2108   APPEARANCEUR CLEAR (A) 01/15/2017 2108   APPEARANCEUR Hazy 05/15/2012 1605   LABSPEC 1.023 01/15/2017 2108   LABSPEC 1.020 05/15/2012 1605   PHURINE 6.0 01/15/2017 2108   GLUCOSEU 50 (A) 01/15/2017 2108   GLUCOSEU Negative 05/15/2012 1605   GLUCOSEU >=1000 11/03/2009 1019   HGBUR NEGATIVE 01/15/2017 2108   BILIRUBINUR NEGATIVE 01/15/2017 2108   BILIRUBINUR Negative 05/15/2012 1605   KETONESUR NEGATIVE 01/15/2017 2108   PROTEINUR NEGATIVE 01/15/2017 2108   UROBILINOGEN 0.2 03/17/2012 0216   NITRITE POSITIVE (A) 01/15/2017 2108   LEUKOCYTESUR NEGATIVE 01/15/2017 2108   LEUKOCYTESUR Negative 05/15/2012 1605     RADIOLOGY: Dg Chest 1 View  Result Date:  02/21/2017 CLINICAL DATA:  COPD. EXAM: CHEST 1 VIEW COMPARISON:  01/17/2017.  11/15/2016. FINDINGS: Cardiomegaly with normal pulmonary vascularity. COPD. Increased markings left lower lobe, developing infiltrate left lower  lobe cannot be excluded. Bilateral nipple shadows noted. No prominent effusion . IMPRESSION: 1. COPD. Mild developing infiltrate left lower lobe cannot be excluded. 2.  Stable cardiomegaly. Electronically Signed   By: Marcello Moores  Register   On: 02/21/2017 06:34    EKG: Orders placed or performed during the hospital encounter of 02/21/17  . EKG 12-Lead  . EKG 12-Lead  . ED EKG  . ED EKG    IMPRESSION AND PLAN: Patient is a 83 year old with COPD presenting with  shortness of breath  1. Acute on chronic COPD exasperation with pneumonia I will treat patient with IV Solu-Medrol and nebulizer therapy For his pneumonia and recent hospitalization will treat him for HCAP Obtain sputum cultures if possible I will substitute his home inhaler  For Pulmicort nebs 2. Essential hypertension currently not on any medications blood pressure is elevated. We'll use IV hydralazine when necessary  3. Hyperlipidemia unspecified continue atorvastatin  4. Depression continue Lipitor  5. Nicotine abuse- smoking cessation provided for many spent strongly recommend he stop smoking he will be started on a nicotine patch  6. Miscellaneous Lovenox for DVT prophylaxis  CODE STATUS discussed with the patient he would like to be a DO NOT RESUSCITATE please see the ACP note for detail  All the records are reviewed and case discussed with ED provider. Management plans discussed with the patient, family and they are in agreement.  CODE STATUS: Code Status History    Date Active Date Inactive Code Status Order ID Comments User Context   01/15/2017 10:43 PM 01/17/2017  3:12 PM Full Code [id number]  Saundra Shelling, MD ED   12/15/2016  8:54 PM 12/16/2016  9:21 PM Full Code [id number]  Dustin Flock, MD Inpatient   12/15/2016  6:40 PM 12/15/2016  8:54 PM DNR [id number]  Dustin Flock, MD ED   2020-07-2317  9:22 PM 11/02/2016  5:40 PM DNR [id number]  Loletha Grayer, MD ED   03/17/2012  5:42 AM 03/19/2012  8:14 PM Full Code [id number]   Marylou Mccoy, RN Inpatient   01/30/2012  4:56 PM 02/03/2012  6:59 PM Full Code [id number]  Murlean Iba, MD Inpatient   08/11/2011  6:03 PM 08/13/2011  6:35 PM Full Code [id number]  Loyal Gambler, RN Inpatient    Advance Directive Documentation     Most Recent Value  Type of Advance Directive  Out of facility DNR (pink MOST or yellow form) [pt states he is unaware of location of form]  Pre-existing out of facility DNR order (yellow form or pink MOST form)  -  "MOST" Form in Place?  -       TOTAL TIME TAKING CARE OF THIS PATIENT:27minutes.    Dustin Flock M.D on 02/21/2017 at 7:43 AM  Between 7am to 6pm - Pager - [phone]  After 6pm go to [email or web address] - password [email or web address] [facility]  Office  [phone]  CC: Primary care physician; System, Pcp Not In

## 2017-02-21 NOTE — Progress Notes (Signed)
Pharmacy Antibiotic Note  [name] [name] is a 83 y.o. male with a h/o COPD admitted on 02/21/2017 with AECOPD with pneumonia.  Pharmacy has been consulted for vancomyicn and cefepime dosing.  Plan: Ke= 0.066 h-1,  Vd= 51 L  Vancomycin 1000 mg iv q 12 hours with stacked dosing and a trough with the fifth dose. Goal trough 15-20 mcg/ml.  F/U MRSA PCR.   Cefepime 2 g iv q 8 hours.   Height: 5\' 11"  (180.3 cm) Weight: 160 lb (72.6 kg) IBW/kg (Calculated) : 75.3  Temp (24hrs), Avg:97.4 F (36.3 C), Min:97.4 F (36.3 C), Max:97.4 F (36.3 C)   Recent Labs Lab 02/21/17 0605 02/21/17 0624  WBC 9.0  --   CREATININE 0.85  --   LATICACIDVEN  --  1.5    Estimated Creatinine Clearance: 71.2 mL/min (by C-G formula based on SCr of 0.85 mg/dL).    No Known Allergies  Antimicrobials this admission: cefepime 6/5 >>  vancomycin 6/5 >>   Dose adjustments this admission:   Microbiology results: 6/5 BCx: sent 6/5 MRSA PCR: pending  Thank you for allowing pharmacy to be a part of this patient's care.  Ulice Dash D 02/21/2017 8:56 AM

## 2017-02-21 NOTE — ED Provider Notes (Addendum)
[facility] Emergency Department Provider Note   ____________________________________________   First MD Initiated Contact with Patient 02/21/17 [phone]     (approximate)  I have reviewed the triage vital signs and the nursing notes.   HISTORY  Chief Complaint Shortness of Breath    HPI [name] [name] is a 83 y.o. male brought to the ED from home via EMS with a chief complaint of shortness of breath. Patient has a history of COPD, not on home oxygen, who reports shortness of breath for the past month, worsening last night approximately 8 PM. Patient was hospitalized last month for similar, intubated. He adamantly refuses intubation or CPR on today's visit.Denies associated fever, chills, chest pain, abdominal pain, nausea, vomiting. Denies recent travel or trauma. EMS gave one DuoNeb and 125 mg Solu-Medrol en route.   Past Medical History:  Diagnosis Date  . ABSCESS 12/03/2009  . ABSCESS, FINGER 04/07/2010  . ANXIETY 11/03/2009  . ASTHMA 11/03/2009  . CHRONIC OBSTRUCTIVE PULMONARY DISEASE, ACUTE EXACERBATION 11/03/2009  . COPD 11/03/2009  . DEPRESSION 11/03/2009  . DIABETES MELLITUS, TYPE II 11/03/2009  . [place] DISEASE, LUMBAR 11/03/2009  . EMPHYSEMA, BULLOUS 11/03/2009  . GERD 11/03/2009  . HYPERLIPIDEMIA 11/03/2009  . HYPERTENSION 11/03/2009  . Kidney stones 01/30/12   "I've had them 7 times; always have passed them"  . PEPTIC ULCER DISEASE 11/03/2009  . Pneumonia   . RASH-NONVESICULAR 11/03/2009  . RESTLESS LEG SYNDROME 11/03/2009  . Shortness of breath    "sometimes; at any time"  . SPINAL STENOSIS, LUMBAR 11/03/2009    Patient Active Problem List   Diagnosis Date Noted  . Overdose of benzodiazepine 01/16/2017  . Unresponsiveness   . HCAP (healthcare-associated pneumonia)   . Respiratory failure ([place]) 01/15/2017  . CVA (cerebral vascular accident) ([place]) 12/15/2016  . Chest pain 12/22/2016  . PNA (pneumonia) 03/17/2012  . Weakness generalized 03/17/2012   . Generalized weakness 01/30/2012  . Fall at home 01/30/2012  . Physical deconditioning 01/30/2012  . Nausea vomiting and diarrhea 01/15/2012  . UTI (urinary tract infection) 01/15/2012  . Cocaine abuse 08/11/2011  . Tobacco abuse 08/11/2011  . Orthostasis 12/23/2010  . Dehydration 12/23/2010  . Abdominal pain, other specified site 12/23/2010  . Dizziness 12/23/2010  . Weight loss 12/23/2010  . Left lumbar radiculopathy 12/23/2010  . DIABETES MELLITUS, TYPE II 11/03/2009  . HYPERLIPIDEMIA 11/03/2009  . ANXIETY 11/03/2009  . DEPRESSION 11/03/2009  . RESTLESS LEG SYNDROME 11/03/2009  . HYPERTENSION 11/03/2009  . EMPHYSEMA, BULLOUS 11/03/2009  . ASTHMA 11/03/2009  . COPD 11/03/2009  . GERD 11/03/2009  . PEPTIC ULCER DISEASE 11/03/2009  . [place] DISEASE, LUMBAR 11/03/2009  . SPINAL STENOSIS, LUMBAR 11/03/2009  . NEPHROLITHIASIS, HX OF 11/03/2009    Past Surgical History:  Procedure Laterality Date  . [place]   left  . INGUINAL HERNIA REPAIR  10/2011   left  . ROTATOR CUFF REPAIR  2003   left  . TONSILLECTOMY  1960    Prior to Admission medications   Medication Sig Start Date End Date Taking? Authorizing Provider  albuterol (PROVENTIL HFA;VENTOLIN HFA) 108 (90 Base) MCG/ACT inhaler Inhale 1-2 puffs into the lungs every 6 (six) hours as needed for wheezing or shortness of breath.  06/03/13  Yes [provider]  aspirin EC 81 MG tablet Take 81 mg by mouth daily.   Yes [provider]  atorvastatin (LIPITOR) 40 MG tablet Take 1 tablet (40 mg total) by mouth daily. 12/16/16  Yes Bettey Costa, MD  [facility] 325 MG CAPS Take 2 capsules by mouth every 6 (six) hours as needed. 12/21/16  Yes [provider]  escitalopram (LEXAPRO) 5 MG tablet Take 1 tablet (5 mg total) by mouth daily. 01/18/17  Yes Vaughan Basta, MD  fluticasone furoate-vilanterol (BREO ELLIPTA) 200-25 MCG/INH AEPB Inhale 1 puff into the lungs daily.  01/18/17  Yes Vaughan Basta, MD  meclizine (ANTIVERT) 12.5 MG tablet Take 1 tablet by mouth every 6 (six) hours as needed. 12/21/16  Yes [provider]    Allergies Patient has no known allergies.  Family History  Problem Relation Age of Onset  . Heart disease Father   . Heart disease Mother   . Cancer Brother        lung    Social History Social History  Substance Use Topics  . Smoking status: Current Every Day Smoker    Packs/day: 1.00    Years: 41.00    Types: Cigarettes  . Smokeless tobacco: Never Used     Comment: "stopped smoking 04/21/1991 then restarted in 2012"  . Alcohol use No    Review of Systems  Constitutional: No fever/chills. Eyes: No visual changes. ENT: No sore throat. Cardiovascular: Denies chest pain. Respiratory: Positive for cough and shortness of breath. Gastrointestinal: No abdominal pain.  No nausea, no vomiting.  No diarrhea.  No constipation. Genitourinary: Negative for dysuria. Musculoskeletal: Negative for back pain. Skin: Negative for rash. Neurological: Negative for headaches, focal weakness or numbness.   ____________________________________________   PHYSICAL EXAM:  VITAL SIGNS: ED Triage Vitals  Enc Vitals Group     BP 02/21/17 0601 (!) 157/89     Pulse Rate 02/21/17 0601 96     Resp 02/21/17 0601 (!) 29     Temp 02/21/17 0601 97.4 F (36.3 C)     Temp Source 02/21/17 0601 Oral     SpO2 02/21/17 0601 97 %     Weight 02/21/17 0602 160 lb (72.6 kg)     Height 02/21/17 0602 5\' 11"  (1.803 m)     Head Circumference --      Peak Flow --      Pain Score --      Pain Loc --      Pain Edu? --      Excl. in [place]? --     Constitutional: Alert and oriented. Ill appearing and in moderate acute distress. Cachectic. Eyes: Conjunctivae are normal. PERRL. EOMI. Head: Atraumatic. Nose: No congestion/rhinnorhea. Mouth/Throat: Mucous membranes are moist.  Oropharynx non-erythematous. Neck: No stridor.   Cardiovascular:  Normal rate, regular rhythm. Grossly normal heart sounds.  Good peripheral circulation. Respiratory: Increased respiratory effort.  Retractions. Lungs with diffuse wheezing. Tripoding. Gastrointestinal: Soft and nontender. No distention. No abdominal bruits. No CVA tenderness. Musculoskeletal: No lower extremity tenderness nor edema.  No joint effusions. Neurologic:  Normal speech and language. No gross focal neurologic deficits are appreciated.  Skin:  Skin is warm, dry and intact. No rash noted. Psychiatric: Mood and affect are depressed. Speech and behavior are normal.  ____________________________________________   LABS (all labs ordered are listed, but only abnormal results are displayed)  Labs Reviewed  BASIC METABOLIC PANEL - Abnormal; Notable for the following:       Result Value   Glucose, Bld 103 (*)    All other components within normal limits  CBC  TROPONIN I  LACTIC ACID, PLASMA  LACTIC ACID, PLASMA   ____________________________________________  EKG  ED  ECG REPORT I, Denaya Horn J, the attending physician, personally viewed and interpreted this ECG.   Date: 02/21/2017  EKG Time: 0600  Rate: 96  Rhythm: normal EKG, normal sinus rhythm  Axis: Normal  Intervals:none  ST&T Change: Nonspecific  ____________________________________________  RADIOLOGY  Portable chest x-ray (viewed by me, interpreted per Dr. register): 1. COPD. Mild developing infiltrate left lower lobe cannot be  excluded.    2. Stable cardiomegaly.   ____________________________________________   PROCEDURES  Procedure(s) performed: None  Procedures  Critical Care performed: Yes, see critical care note(s)   CRITICAL CARE Performed by: Paulette Blanch   Total critical care time: 30 minutes  Critical care time was exclusive of separately billable procedures and treating other patients.  Critical care was necessary to treat or prevent imminent or life-threatening  deterioration.  Critical care was time spent personally by me on the following activities: development of treatment plan with patient and/or surrogate as well as nursing, discussions with consultants, evaluation of patient's response to treatment, examination of patient, obtaining history from patient or surrogate, ordering and performing treatments and interventions, ordering and review of laboratory studies, ordering and review of radiographic studies, pulse oximetry and re-evaluation of patient's condition.  ____________________________________________   INITIAL IMPRESSION / ASSESSMENT AND PLAN / ED COURSE  Pertinent labs & imaging results that were available during my care of the patient were reviewed by me and considered in my medical decision making (see chart for details).  83 year old male with COPD who presents with progressive shortness of breath, wheezing and cough. He is tachypneic with retractions and diffuse wheezing on exam. Received Solu-Medrol en route by EMS. Will administer duo nebs; consider BiPAP as needed. Anticipate hospitalization. DO NOT RESUSCITATE form completed per patient's request.  Clinical Course as of Feb 21 717  Tue Feb 21, 2017  0711 Breathing slightly improved. Wheezing remains. Will administer additional DuoNeb and discuss with hospitalist to evaluate patient in the emergency department for admission.  [JS]    Clinical Course User Index [JS] Paulette Blanch, MD     ____________________________________________   FINAL CLINICAL IMPRESSION(S) / ED DIAGNOSES  Final diagnoses:  COPD exacerbation ([place])  Respiratory distress  Shortness of breath  HCAP (healthcare-associated pneumonia)      NEW MEDICATIONS STARTED DURING THIS VISIT:  New Prescriptions   No medications on file     Note:  This document was prepared using Dragon voice recognition software and may include unintentional dictation errors.    Paulette Blanch, MD 02/21/17 Gloriajean Dell     Paulette Blanch, MD 02/21/17 [phone]

## 2017-02-21 NOTE — ED Triage Notes (Signed)
Pt arrived via ems from home. Pt has a history of COPD and become increasingly short of breath beginning last night at 2000. Pt was given 1 duo neb and 125mg  of solu -medrol upon arrival. Upon arrival pt's oxygen saturation 95% however pt's breathing is labored and wheezing is heard upon auscultation. Pt is vocal that he does not want to be intubated or resuscitated if those measures are needed. Pt states he does have a DNR form but is unsure of it's location. Charge nurse made aware and provided packet to pt.

## 2017-02-21 NOTE — Progress Notes (Signed)
Advanced care plan.  Purpose of the Encounter: CODE STATUS  Parties in Attendance: patient him self  Patient's Decision Capacity:intact  Subjective/Patient's story: Patient is a 83 year old with history of COPD recent hospitalization for possible overdose who is coming in with shortness of breath and COPD exasperation pneumonia    Objective/Medical story I discussed with the patient regarding CODE STATUS explained him what CPR is, intubation. Patient states that he does not want CPR and be intubated. He would like to have be DO NOT RESUSCITATE    Goals of care determination: CODE STATUS DO NOT RESUSCITATE    CODE STATUS: DO NOT RESUSCITATE   Time spent discussing advanced care planning: 55minutes

## 2017-02-21 NOTE — Progress Notes (Signed)
Patient on phone with niece when entering room. Patient seemed frustrated and was yelling. Patient asked me to talk to niece because she had questions. Niece asked me general questions about the patient's diagnosis and how long his hospitalization would be. She then informed me that she was moving to [place], [place] today or tomorrow and that "he would have to find another place to live and that he was our problem now". She then hung up on me. Last 250 mL bolus or NS hung and patient was quietly watching TV. Approximately 30 mins later I was called to patients room and patient stated that he was leaving and going to pull out his IV's. We calmly told him he would have to leave against medical advice and would not be discharged. Dr. Posey Pronto notifed and agreed that he would have to leave AMA if he wished to do so. Patient agreed to leave against medical advice and was educated about the options. AMA paperwork signed per policy. IV's removed per policy. Patient given scrub top to wear due to his shirt missing. Patient walked to elevator escorted by nurse planning to call taxi for a ride home. Patient got on elevator to go home.

## 2017-02-22 LAB — BLOOD CULTURE ID PANEL (REFLEXED)
Acinetobacter baumannii: NOT DETECTED
CANDIDA ALBICANS: NOT DETECTED
CANDIDA TROPICALIS: NOT DETECTED
Candida glabrata: NOT DETECTED
Candida krusei: NOT DETECTED
Candida parapsilosis: NOT DETECTED
ENTEROBACTERIACEAE SPECIES: NOT DETECTED
ENTEROCOCCUS SPECIES: NOT DETECTED
Enterobacter cloacae complex: NOT DETECTED
Escherichia coli: NOT DETECTED
HAEMOPHILUS INFLUENZAE: NOT DETECTED
KLEBSIELLA PNEUMONIAE: NOT DETECTED
Klebsiella oxytoca: NOT DETECTED
Listeria monocytogenes: NOT DETECTED
METHICILLIN RESISTANCE: NOT DETECTED
Neisseria meningitidis: NOT DETECTED
PROTEUS SPECIES: NOT DETECTED
Pseudomonas aeruginosa: NOT DETECTED
STAPHYLOCOCCUS SPECIES: DETECTED — AB
STREPTOCOCCUS SPECIES: NOT DETECTED
Serratia marcescens: NOT DETECTED
Staphylococcus aureus (BCID): NOT DETECTED
Streptococcus agalactiae: NOT DETECTED
Streptococcus pneumoniae: NOT DETECTED
Streptococcus pyogenes: NOT DETECTED

## 2017-02-22 NOTE — Discharge Summary (Signed)
[name] [name] is a 83 year old who presented with shortness of breath and cough and was admitted for IV antibiotics. However patient after being in the hospital for a few hours stated that he could not stay in the hospital he had something to do at home. I strongly advised he stay in the hospital however he refused

## 2017-02-24 LAB — CULTURE, BLOOD (ROUTINE X 2): SPECIAL REQUESTS: ADEQUATE

## 2017-02-26 LAB — CULTURE, BLOOD (ROUTINE X 2): Culture: NO GROWTH

## 2017-03-24 ENCOUNTER — Inpatient Hospital Stay (HOSPITAL_COMMUNITY)
Admission: EM | Admit: 2017-03-24 | Discharge: 2017-03-26 | DRG: 189 | Disposition: A | Discharge status: Home / Self Care | Payer: Medicare (Managed Care) | Attending: Internal Medicine | Admitting: Internal Medicine

## 2017-03-24 ENCOUNTER — Emergency Department (HOSPITAL_COMMUNITY): Payer: Medicare (Managed Care)

## 2017-03-24 ENCOUNTER — Encounter (HOSPITAL_COMMUNITY): Payer: Self-pay

## 2017-03-24 DIAGNOSIS — Z8711 Personal history of peptic ulcer disease: Secondary | ICD-10-CM

## 2017-03-24 DIAGNOSIS — J441 Chronic obstructive pulmonary disease with (acute) exacerbation: Secondary | ICD-10-CM | POA: Diagnosis present

## 2017-03-24 DIAGNOSIS — E119 Type 2 diabetes mellitus without complications: Secondary | ICD-10-CM | POA: Diagnosis not present

## 2017-03-24 DIAGNOSIS — Z7984 Long term (current) use of oral hypoglycemic drugs: Secondary | ICD-10-CM

## 2017-03-24 DIAGNOSIS — Z801 Family history of malignant neoplasm of trachea, bronchus and lung: Secondary | ICD-10-CM

## 2017-03-24 DIAGNOSIS — F1910 Other psychoactive substance abuse, uncomplicated: Secondary | ICD-10-CM | POA: Diagnosis present

## 2017-03-24 DIAGNOSIS — R0902 Hypoxemia: Secondary | ICD-10-CM | POA: Diagnosis present

## 2017-03-24 DIAGNOSIS — L8990 Pressure ulcer of unspecified site, unspecified stage: Secondary | ICD-10-CM | POA: Insufficient documentation

## 2017-03-24 DIAGNOSIS — Z8673 Personal history of transient ischemic attack (TIA), and cerebral infarction without residual deficits: Secondary | ICD-10-CM

## 2017-03-24 DIAGNOSIS — Z8249 Family history of ischemic heart disease and other diseases of the circulatory system: Secondary | ICD-10-CM

## 2017-03-24 DIAGNOSIS — Z79899 Other long term (current) drug therapy: Secondary | ICD-10-CM

## 2017-03-24 DIAGNOSIS — Z7951 Long term (current) use of inhaled steroids: Secondary | ICD-10-CM

## 2017-03-24 DIAGNOSIS — J9621 Acute and chronic respiratory failure with hypoxia: Secondary | ICD-10-CM | POA: Diagnosis not present

## 2017-03-24 DIAGNOSIS — Z9889 Other specified postprocedural states: Secondary | ICD-10-CM

## 2017-03-24 DIAGNOSIS — Z87442 Personal history of urinary calculi: Secondary | ICD-10-CM

## 2017-03-24 DIAGNOSIS — E785 Hyperlipidemia, unspecified: Secondary | ICD-10-CM | POA: Diagnosis present

## 2017-03-24 DIAGNOSIS — G2581 Restless legs syndrome: Secondary | ICD-10-CM | POA: Diagnosis present

## 2017-03-24 DIAGNOSIS — Z720 Tobacco use: Secondary | ICD-10-CM | POA: Diagnosis not present

## 2017-03-24 DIAGNOSIS — I10 Essential (primary) hypertension: Secondary | ICD-10-CM | POA: Diagnosis present

## 2017-03-24 DIAGNOSIS — K219 Gastro-esophageal reflux disease without esophagitis: Secondary | ICD-10-CM | POA: Diagnosis present

## 2017-03-24 DIAGNOSIS — Z66 Do not resuscitate: Secondary | ICD-10-CM | POA: Diagnosis present

## 2017-03-24 DIAGNOSIS — F17210 Nicotine dependence, cigarettes, uncomplicated: Secondary | ICD-10-CM | POA: Diagnosis present

## 2017-03-24 LAB — I-STAT TROPONIN, ED: Troponin i, poc: 0 ng/mL (ref 0.00–0.08)

## 2017-03-24 LAB — BASIC METABOLIC PANEL
Anion gap: 7 (ref 5–15)
BUN: 16 mg/dL (ref 6–20)
CHLORIDE: 105 mmol/L (ref 101–111)
CO2: 26 mmol/L (ref 22–32)
CREATININE: 1.11 mg/dL (ref 0.61–1.24)
Calcium: 8.4 mg/dL — ABNORMAL LOW (ref 8.9–10.3)
GFR calc Af Amer: >60 mL/min (ref >60)
GFR calc non Af Amer: >60 mL/min (ref >60)
Glucose, Bld: 148 mg/dL — ABNORMAL HIGH (ref 65–99)
POTASSIUM: 3.6 mmol/L (ref 3.5–5.1)
SODIUM: 138 mmol/L (ref 135–145)

## 2017-03-24 LAB — GLUCOSE, CAPILLARY
GLUCOSE-CAPILLARY: 190 mg/dL — AB (ref 65–99)
GLUCOSE-CAPILLARY: 182 mg/dL — AB (ref 65–99)
GLUCOSE-CAPILLARY: 192 mg/dL — AB (ref 65–99)
Glucose-Capillary: 126 mg/dL — ABNORMAL HIGH (ref 65–99)

## 2017-03-24 LAB — BRAIN NATRIURETIC PEPTIDE: B NATRIURETIC PEPTIDE 5: 24.8 pg/mL (ref 0.0–100.0)

## 2017-03-24 LAB — CBC
HCT: 40.9 % (ref 39.0–52.0)
Hemoglobin: 14 g/dL (ref 13.0–17.0)
MCH: 30.7 pg (ref 26.0–34.0)
MCHC: 34.2 g/dL (ref 30.0–36.0)
MCV: 89.7 fL (ref 78.0–100.0)
PLATELETS: 197 10*3/uL (ref 150–400)
RBC: 4.56 MIL/uL (ref 4.22–5.81)
RDW: 14.5 % (ref 11.5–15.5)
WBC: 9.8 10*3/uL (ref 4.0–10.5)

## 2017-03-24 MED ORDER — METHYLPREDNISOLONE SODIUM SUCC 40 MG IJ SOLR
40.0000 mg | Freq: Two times a day (BID) | INTRAMUSCULAR | Status: DC
  Administered 2017-03-24 – 2017-03-25 (×2): 40 mg via INTRAVENOUS
  Filled 2017-03-24 (×2): qty 1

## 2017-03-24 MED ORDER — ALBUTEROL SULFATE (2.5 MG/3ML) 0.083% IN NEBU
5.0000 mg | INHALATION_SOLUTION | Freq: Once | RESPIRATORY_TRACT | Status: AC
  Administered 2017-03-24: 5 mg via RESPIRATORY_TRACT
  Filled 2017-03-24: qty 6

## 2017-03-24 MED ORDER — LEVOFLOXACIN IN D5W 500 MG/100ML IV SOLN
500.0000 mg | Freq: Every day | INTRAVENOUS | Status: DC
  Administered 2017-03-24: 500 mg via INTRAVENOUS
  Filled 2017-03-24: qty 100

## 2017-03-24 MED ORDER — IPRATROPIUM-ALBUTEROL 0.5-2.5 (3) MG/3ML IN SOLN
3.0000 mL | Freq: Once | RESPIRATORY_TRACT | Status: AC
  Administered 2017-03-24: 3 mL via RESPIRATORY_TRACT
  Filled 2017-03-24: qty 3

## 2017-03-24 MED ORDER — LISINOPRIL 10 MG PO TABS
10.0000 mg | ORAL_TABLET | Freq: Every day | ORAL | Status: DC
  Administered 2017-03-24: 10 mg via ORAL
  Filled 2017-03-24: qty 1

## 2017-03-24 MED ORDER — ENOXAPARIN SODIUM 40 MG/0.4ML ~~LOC~~ SOLN
40.0000 mg | SUBCUTANEOUS | Status: DC
  Administered 2017-03-24 – 2017-03-26 (×3): 40 mg via SUBCUTANEOUS
  Filled 2017-03-24 (×3): qty 0.4

## 2017-03-24 MED ORDER — MAGNESIUM SULFATE 2 GM/50ML IV SOLN
2.0000 g | Freq: Once | INTRAVENOUS | Status: AC
  Administered 2017-03-24: 2 g via INTRAVENOUS
  Filled 2017-03-24: qty 50

## 2017-03-24 MED ORDER — BUDESONIDE 0.5 MG/2ML IN SUSP
0.5000 mg | Freq: Two times a day (BID) | RESPIRATORY_TRACT | Status: DC
  Administered 2017-03-24 – 2017-03-26 (×5): 0.5 mg via RESPIRATORY_TRACT
  Filled 2017-03-24 (×5): qty 2

## 2017-03-24 MED ORDER — IPRATROPIUM-ALBUTEROL 0.5-2.5 (3) MG/3ML IN SOLN: 3.0000 mL | RESPIRATORY_TRACT | Status: DC | PRN

## 2017-03-24 MED ORDER — MAGNESIUM SULFATE 50 % IJ SOLN: 2.0000 g | Freq: Once | INTRAMUSCULAR | Status: DC

## 2017-03-24 MED ORDER — ONDANSETRON HCL 4 MG/2ML IJ SOLN: 4.0000 mg | Freq: Four times a day (QID) | INTRAMUSCULAR | Status: DC | PRN

## 2017-03-24 MED ORDER — INSULIN ASPART 100 UNIT/ML ~~LOC~~ SOLN
0.0000 [IU] | Freq: Three times a day (TID) | SUBCUTANEOUS | Status: DC
  Administered 2017-03-24 – 2017-03-25 (×3): 2 [IU] via SUBCUTANEOUS
  Administered 2017-03-25 – 2017-03-26 (×2): 1 [IU] via SUBCUTANEOUS

## 2017-03-24 MED ORDER — ORAL CARE MOUTH RINSE
15.0000 mL | Freq: Two times a day (BID) | OROMUCOSAL | Status: DC
  Administered 2017-03-25 – 2017-03-26 (×3): 15 mL via OROMUCOSAL

## 2017-03-24 MED ORDER — SODIUM CHLORIDE 0.9 % IV BOLUS (SEPSIS)
500.0000 mL | Freq: Once | INTRAVENOUS | Status: AC
  Administered 2017-03-24: 500 mL via INTRAVENOUS

## 2017-03-24 MED ORDER — ATORVASTATIN CALCIUM 40 MG PO TABS
40.0000 mg | ORAL_TABLET | Freq: Every day | ORAL | Status: DC
  Administered 2017-03-24 – 2017-03-25 (×2): 40 mg via ORAL
  Filled 2017-03-24 (×2): qty 1

## 2017-03-24 MED ORDER — ACETAMINOPHEN 325 MG PO TABS: 650.0000 mg | ORAL_TABLET | Freq: Four times a day (QID) | ORAL | Status: DC | PRN

## 2017-03-24 MED ORDER — INSULIN ASPART 100 UNIT/ML ~~LOC~~ SOLN: 0.0000 [IU] | Freq: Every day | SUBCUTANEOUS | Status: DC

## 2017-03-24 MED ORDER — METHYLPREDNISOLONE SODIUM SUCC 125 MG IJ SOLR
125.0000 mg | Freq: Once | INTRAMUSCULAR | Status: AC
  Administered 2017-03-24: 125 mg via INTRAVENOUS
  Filled 2017-03-24: qty 2

## 2017-03-24 MED ORDER — ACETAMINOPHEN 650 MG RE SUPP: 650.0000 mg | Freq: Four times a day (QID) | RECTAL | Status: DC | PRN

## 2017-03-24 MED ORDER — AZITHROMYCIN 250 MG PO TABS
500.0000 mg | ORAL_TABLET | Freq: Every day | ORAL | Status: DC
  Administered 2017-03-25 – 2017-03-26 (×2): 500 mg via ORAL
  Filled 2017-03-24 (×2): qty 2

## 2017-03-24 MED ORDER — GUAIFENESIN ER 600 MG PO TB12
600.0000 mg | ORAL_TABLET | Freq: Two times a day (BID) | ORAL | Status: DC
  Administered 2017-03-24 – 2017-03-26 (×5): 600 mg via ORAL
  Filled 2017-03-24 (×5): qty 1

## 2017-03-24 MED ORDER — IPRATROPIUM-ALBUTEROL 0.5-2.5 (3) MG/3ML IN SOLN
3.0000 mL | Freq: Four times a day (QID) | RESPIRATORY_TRACT | Status: DC
  Administered 2017-03-24 – 2017-03-26 (×10): 3 mL via RESPIRATORY_TRACT
  Filled 2017-03-24 (×10): qty 3

## 2017-03-24 MED ORDER — ARFORMOTEROL TARTRATE 15 MCG/2ML IN NEBU
15.0000 ug | INHALATION_SOLUTION | Freq: Two times a day (BID) | RESPIRATORY_TRACT | Status: DC
  Administered 2017-03-24 – 2017-03-26 (×5): 15 ug via RESPIRATORY_TRACT
  Filled 2017-03-24 (×5): qty 2

## 2017-03-24 MED ORDER — CHLORHEXIDINE GLUCONATE 0.12 % MT SOLN
15.0000 mL | Freq: Two times a day (BID) | OROMUCOSAL | Status: DC
  Administered 2017-03-24 – 2017-03-26 (×5): 15 mL via OROMUCOSAL
  Filled 2017-03-24 (×4): qty 15

## 2017-03-24 MED ORDER — METHYLPREDNISOLONE SODIUM SUCC 125 MG IJ SOLR
60.0000 mg | Freq: Three times a day (TID) | INTRAMUSCULAR | Status: DC
  Administered 2017-03-24: 60 mg via INTRAVENOUS
  Filled 2017-03-24: qty 2

## 2017-03-24 MED ORDER — ONDANSETRON HCL 4 MG PO TABS: 4.0000 mg | ORAL_TABLET | Freq: Four times a day (QID) | ORAL | Status: DC | PRN

## 2017-03-24 NOTE — Progress Notes (Signed)
PROGRESS NOTE    [name] [name]  DGL:[id number] DOB: Mar 18, 1934 DOA: 03/24/2017 PCP: System, Pcp Not In    Brief Narrative:  83 year old male who presented with shortness of breath. Patient is known to have hypertension, dyslipidemia, COPD, type 2 diabetes mellitus, polysubstance abuse and GERD. Patient complained of worsening dyspnea for the last 4 weeks, associated with productive coughing and wheezing. Recent hospitalization for same symptoms in [place], apparently he received bronchodilators, antibiotics and steroids. Patient's symptoms have been refractive to the use of his home bronchodilators, continue to smoke cigarettes. On initial physical examination his oxygen saturation was 88% on room air, improved to 92% on supplemental oxygen, blood pressure 113/67, heart rate 104, respiratory 15-18, dry mucous membranes, lungs with decrease air movement bilaterally, expiratory wheezing, positive rales, no rhonchi. Heart S1-S2 present and rhythmic, no gallops or murmurs, abdomen soft and nontender, no lower extremity edema. Sodium 138, potassium 3.6, chloride 105, bicarbonate 26, glucose 148, BUN 16, creatinine 1.11, white count 9.8, [place] 14.0, hematocrit 40.9, platelets 197. Chest x-ray with significant hyperinflation, but I notice apical lobes, scarring at bases bilaterally, no pneumothorax. EKG was sinus tachycardia.   Patient admitted to the hospital working diagnosis of COPD exacerbation, complicated by acute hypoxic respiratory failure.   Assessment & Plan:   Principal Problem:   COPD exacerbation ([place]) Active Problems:   Tobacco abuse   Hypoxia   Diabetes mellitus type 2 in nonobese (HCC)   Polysubstance abuse  1. COPD exacerbation with acute hypoxic respiratory failure. Chest film personally reviewed, noted significant hyperinflation and emphysematous changes, no infiltrates, will continue bronchodilator therapy with duoneb, will reduce dose of steroids and will change antibiotic  therapy to macrolide. Will avoid levofloxacin for now. Out of bed as tolerated.   2. HTN. Systolic blood pressure 518 to 118. Will continue blood pressure monitoring and will hold on lisinopril to avoid hypotension.    3. T2DM. Will continue glucose cover and monitoring, with insulin sliding scale, will calculate requirements before starting basal insulin. Patient tolerating well po. No nausea or vomiting.   4. Tobacco and substance abuse. Smoking cessation counseling.    DVT prophylaxis: enoxaparin  Code Status: full  Family Communication:  Disposition Plan:    Consultants:     Procedures:     Antimicrobials:   Azithromycin.    Subjective: Patient with persistent dyspnea, but improved in intensity, associated with dry cough, no chest pain, no nausea or vomiting. Patient has lost 20 lbs in 5 mo, not intentional.   Objective: Vitals:   03/24/17 0700 03/24/17 0827 03/24/17 0829 03/24/17 0906  BP: 106/62 118/70    Pulse: 98 95    Resp: 16 18    Temp:  97.6 F (36.4 C)    TempSrc:  Oral    SpO2: 93% 98% 94%   Weight:    71.1 kg (156 lb 12 oz)    Intake/Output Summary (Last 24 hours) at 03/24/17 1152 Last data filed at 03/24/17 1010  Gross per 24 hour  Intake              240 ml  Output                0 ml  Net              240 ml   Filed Weights   03/24/17 0906  Weight: 71.1 kg (156 lb 12 oz)    Examination:  General exam: deconditioned E ENT: mil pallor, no icterus, oral  mucosa moist.  Respiratory system: Decreased air movement with scattered inspiratory rales, no wheezing or significant rhonchi.  Cardiovascular system: S1 & S2 heard, RRR. No JVD, murmurs, rubs, gallops or clicks. No pedal edema. Gastrointestinal system: Abdomen is nondistended, soft and nontender. No organomegaly or masses felt. Normal bowel sounds heard. Central nervous system: Alert and oriented. No focal neurological deficits. Extremities: Symmetric 5 x 5 power. Skin: No rashes,  lesions or ulcers     Data Reviewed: I have personally reviewed following labs and imaging studies  CBC:  Recent Labs Lab 03/24/17 0145  WBC 9.8  HGB 14.0  HCT 40.9  MCV 89.7  PLT 025   Basic Metabolic Panel:  Recent Labs Lab 03/24/17 0145  NA 138  K 3.6  CL 105  CO2 26  GLUCOSE 148*  BUN 16  CREATININE 1.11  CALCIUM 8.4*   GFR: Estimated Creatinine Clearance: 53.4 mL/min (by C-G formula based on SCr of 1.11 mg/dL). Liver Function Tests: No results for input(s): AST, ALT, ALKPHOS, BILITOT, PROT, ALBUMIN in the last 168 hours. No results for input(s): LIPASE, AMYLASE in the last 168 hours. No results for input(s): AMMONIA in the last 168 hours. Coagulation Profile: No results for input(s): INR, PROTIME in the last 168 hours. Cardiac Enzymes: No results for input(s): CKTOTAL, CKMB, CKMBINDEX, TROPONINI in the last 168 hours. BNP (last 3 results) No results for input(s): PROBNP in the last 8760 hours. HbA1C: No results for input(s): HGBA1C in the last 72 hours. CBG:  Recent Labs Lab 03/24/17 0843  GLUCAP 190*   Lipid Profile: No results for input(s): CHOL, HDL, LDLCALC, TRIG, CHOLHDL, LDLDIRECT in the last 72 hours. Thyroid Function Tests: No results for input(s): TSH, T4TOTAL, FREET4, T3FREE, THYROIDAB in the last 72 hours. Anemia Panel: No results for input(s): VITAMINB12, FOLATE, FERRITIN, TIBC, IRON, RETICCTPCT in the last 72 hours. Sepsis Labs: No results for input(s): PROCALCITON, LATICACIDVEN in the last 168 hours.  No results found for this or any previous visit (from the past 240 hour(s)).       Radiology Studies: Dg Chest 2 View  Result Date: 03/24/2017 CLINICAL DATA:  Shortness of breath than bilateral feet swelling for several weeks. EXAM: CHEST  2 VIEW COMPARISON:  Most recent radiographs 02/21/2017, multiple priors FINDINGS: Again seen hyperinflation and emphysema with bullous changes in the left upper lobe. Unchanged heart size and  mediastinal contours. Bibasilar atelectasis and scarring. No convincing pulmonary edema. No pleural fluid. No pneumothorax. IMPRESSION: Bullous emphysema with bibasilar atelectasis/scarring. No definite superimposed acute abnormality. Electronically Signed   By: Jeb Levering M.D.   On: 03/24/2017 02:37        Scheduled Meds: . arformoterol  15 mcg Nebulization BID  . atorvastatin  40 mg Oral q1800  . budesonide (PULMICORT) nebulizer solution  0.5 mg Nebulization BID  . enoxaparin (LOVENOX) injection  40 mg Subcutaneous Q24H  . guaiFENesin  600 mg Oral BID  . insulin aspart  0-5 Units Subcutaneous QHS  . insulin aspart  0-9 Units Subcutaneous TID WC  . ipratropium-albuterol  3 mL Nebulization QID  . lisinopril  10 mg Oral Daily  . methylPREDNISolone (SOLU-MEDROL) injection  60 mg Intravenous Q8H   Continuous Infusions: . levofloxacin (LEVAQUIN) IV 500 mg (03/24/17 0725)     LOS: 0 days      Mauricio Gerome Apley, MD [facility] Pager [phone]  If 7PM-7AM, please contact night-coverage [email or web address] Password [email or web address] 03/24/2017, 11:52 AM

## 2017-03-24 NOTE — ED Provider Notes (Signed)
[facility] DEPT Provider Note   CSN: [id number] Arrival date & time: 03/24/17  0108 By signing my name below, I, Dyke Brackett, attest that this documentation has been prepared under the direction and in the presence of Veryl Speak, MD . Electronically Signed: Dyke Brackett, Scribe. 03/24/2017. 1:45 AM.    History   Chief Complaint Chief Complaint  Patient presents with  . Shortness of Breath   HPI ROYE GUSTAFSON is a 83 y.o. male with a history of COPD, emphysema, CVA and DM type 2 who presents to the Emergency Department complaining of intermittent, moderate shortness of breath onset one month ago. He notes associated bilateral feet swelling, and productive cough,. No alleviating or modifying factors noted.  Pt states he was admitted for the same to a hospital in [place], [place] for ~1 week. Per pt, he has been treated with abx, inhaler, and prednisone with no relief. No recent sick contact. Pt has no other acute complaints or associated symptoms at this time.    The history is provided by the patient. No language interpreter was used.    Past Medical History:  Diagnosis Date  . ABSCESS 12/03/2009  . ABSCESS, FINGER 04/07/2010  . ANXIETY 11/03/2009  . ASTHMA 11/03/2009  . CHRONIC OBSTRUCTIVE PULMONARY DISEASE, ACUTE EXACERBATION 11/03/2009  . COPD 11/03/2009  . DEPRESSION 11/03/2009  . DIABETES MELLITUS, TYPE II 11/03/2009  . [place] DISEASE, LUMBAR 11/03/2009  . EMPHYSEMA, BULLOUS 11/03/2009  . GERD 11/03/2009  . HYPERLIPIDEMIA 11/03/2009  . HYPERTENSION 11/03/2009  . Kidney stones 01/30/12   "I've had them 7 times; always have passed them"  . PEPTIC ULCER DISEASE 11/03/2009  . Pneumonia   . RASH-NONVESICULAR 11/03/2009  . RESTLESS LEG SYNDROME 11/03/2009  . Shortness of breath    "sometimes; at any time"  . SPINAL STENOSIS, LUMBAR 11/03/2009    Patient Active Problem List   Diagnosis Date Noted  . Overdose of benzodiazepine 01/16/2017  . Unresponsiveness   . HCAP  (healthcare-associated pneumonia)   . Respiratory failure ([place]) 01/15/2017  . CVA (cerebral vascular accident) ([place]) 12/15/2016  . Chest pain 07-21-202018  . PNA (pneumonia) 03/17/2012  . Weakness generalized 03/17/2012  . Generalized weakness 01/30/2012  . Fall at home 01/30/2012  . Physical deconditioning 01/30/2012  . Nausea vomiting and diarrhea 01/15/2012  . UTI (urinary tract infection) 01/15/2012  . Cocaine abuse 08/11/2011  . Tobacco abuse 08/11/2011  . Orthostasis 12/23/2010  . Dehydration 12/23/2010  . Abdominal pain, other specified site 12/23/2010  . Dizziness 12/23/2010  . Weight loss 12/23/2010  . Left lumbar radiculopathy 12/23/2010  . DIABETES MELLITUS, TYPE II 11/03/2009  . HYPERLIPIDEMIA 11/03/2009  . ANXIETY 11/03/2009  . DEPRESSION 11/03/2009  . RESTLESS LEG SYNDROME 11/03/2009  . HYPERTENSION 11/03/2009  . EMPHYSEMA, BULLOUS 11/03/2009  . ASTHMA 11/03/2009  . COPD 11/03/2009  . GERD 11/03/2009  . PEPTIC ULCER DISEASE 11/03/2009  . [place] DISEASE, LUMBAR 11/03/2009  . SPINAL STENOSIS, LUMBAR 11/03/2009  . NEPHROLITHIASIS, HX OF 11/03/2009    Past Surgical History:  Procedure Laterality Date  . [place]   left  . INGUINAL HERNIA REPAIR  10/2011   left  . ROTATOR CUFF REPAIR  2003   left  . TONSILLECTOMY  1960       Home Medications    Prior to Admission medications   Medication Sig Start Date End Date Taking? Authorizing Provider  albuterol (PROVENTIL HFA;VENTOLIN HFA) 108 (90 Base) MCG/ACT inhaler Inhale 1-2 puffs into the lungs every  6 (six) hours as needed for wheezing or shortness of breath.  06/03/13   [provider]  aspirin EC 81 MG tablet Take 81 mg by mouth daily.    [provider]  atorvastatin (LIPITOR) 40 MG tablet Take 1 tablet (40 mg total) by mouth daily. 12/16/16   Bettey Costa, MD  [facility] 325 MG CAPS Take 2 capsules by mouth every 6 (six) hours as needed. 12/21/16   [provider]  escitalopram (LEXAPRO) 5 MG tablet Take 1 tablet (5 mg total) by mouth daily. 01/18/17   Vaughan Basta, MD  fluticasone furoate-vilanterol (BREO ELLIPTA) 200-25 MCG/INH AEPB Inhale 1 puff into the lungs daily. 01/18/17   Vaughan Basta, MD  meclizine (ANTIVERT) 12.5 MG tablet Take 1 tablet by mouth every 6 (six) hours as needed. 12/21/16   [provider]    Family History Family History  Problem Relation Age of Onset  . Heart disease Father   . Heart disease Mother   . Cancer Brother        lung    Social History Social History  Substance Use Topics  . Smoking status: Current Every Day Smoker    Packs/day: 1.00    Years: 41.00    Types: Cigarettes  . Smokeless tobacco: Never Used     Comment: "stopped smoking 04/21/1991 then restarted in 2012"  . Alcohol use No     Allergies   Patient has no known allergies.   Review of Systems Review of Systems All systems reviewed and are negative for acute change except as noted in the HPI.  Physical Exam Updated Vital Signs BP (!) 81/52   Pulse 91   Temp 97.7 F (36.5 C) (Oral)   Resp 10   SpO2 99%   Physical Exam  Constitutional: He is oriented to person, place, and time. He appears well-developed and well-nourished.  HENT:  Head: Normocephalic and atraumatic.  Eyes: EOM are normal.  Neck: Normal range of motion.  Cardiovascular: Normal rate, regular rhythm, normal heart sounds and intact distal pulses.   Pulmonary/Chest: Effort normal. No respiratory distress. He has wheezes. He has no rales.  There are expiratory rhonchi bilaterally   Abdominal: Soft. He exhibits no distension. There is no tenderness.  Musculoskeletal: Normal range of motion.  Pt has support socks on with minimal edema noted   Neurological: He is alert and oriented to person, place, and time.  Skin: Skin is warm and dry.  Psychiatric: He has a normal mood and affect. Judgment normal.  Nursing note and vitals  reviewed.   ED Treatments / Results  DIAGNOSTIC STUDIES:  Oxygen Saturation is 95% on RA, normal by my interpretation.    COORDINATION OF CARE:  1:44 AM Discussed treatment plan with pt at bedside and pt agreed to plan.   Labs (all labs ordered are listed, but only abnormal results are displayed) Labs Reviewed  BASIC METABOLIC PANEL - Abnormal; Notable for the following:       Result Value   Glucose, Bld 148 (*)    Calcium 8.4 (*)    All other components within normal limits  CBC  BRAIN NATRIURETIC PEPTIDE  I-STAT TROPOININ, ED    EKG ED ECG REPORT   Date: 03/24/2017  Rate: 102  Rhythm: sinus tachycardia  QRS Axis: normal  Intervals: normal  ST/T Wave abnormalities: normal  Conduction Disutrbances:none  Narrative Interpretation:   Old EKG Reviewed: unchanged  I have personally reviewed the EKG tracing  and agree with the computerized printout as noted.   Radiology Dg Chest 2 View  Result Date: 03/24/2017 CLINICAL DATA:  Shortness of breath than bilateral feet swelling for several weeks. EXAM: CHEST  2 VIEW COMPARISON:  Most recent radiographs 02/21/2017, multiple priors FINDINGS: Again seen hyperinflation and emphysema with bullous changes in the left upper lobe. Unchanged heart size and mediastinal contours. Bibasilar atelectasis and scarring. No convincing pulmonary edema. No pleural fluid. No pneumothorax. IMPRESSION: Bullous emphysema with bibasilar atelectasis/scarring. No definite superimposed acute abnormality. Electronically Signed   By: Jeb Levering M.D.   On: 03/24/2017 02:37    Procedures Procedures (including critical care time)  Medications Ordered in ED Medications  methylPREDNISolone sodium succinate (SOLU-MEDROL) 125 mg/2 mL injection 125 mg (not administered)  albuterol (PROVENTIL) (2.5 MG/3ML) 0.083% nebulizer solution 5 mg (5 mg Nebulization Given 03/24/17 0156)  ipratropium-albuterol (DUONEB) 0.5-2.5 (3) MG/3ML nebulizer solution 3 mL (3 mLs  Nebulization Given 03/24/17 0156)     Initial Impression / Assessment and Plan / ED Course  I have reviewed the triage vital signs and the nursing notes.  Pertinent labs & imaging results that were available during my care of the patient were reviewed by me and considered in my medical decision making (see chart for details).  Patient with history of COPD presenting with wheezing and shortness of breath that is worsened over the past several days, but has been present for nearly a month. This patient is from [place] and is traveling through the area. He was actually hospitalized last week for approximately 5 days in [place] for a COPD exacerbation. He is having continued wheezing, difficulty breathing. He was given 2 albuterol treatments, magnesium, steroids. This has not helped much. His workup reveals no obvious infiltrate, normal BNP, and otherwise unremarkable cardiac workup. He is not feeling much better after interventions in the ED and I feel will require admission for further treatment.  Final Clinical Impressions(s) / ED Diagnoses   Final diagnoses:  None    New Prescriptions New Prescriptions   No medications on file   I personally performed the services described in this documentation, which was scribed in my presence. The recorded information has been reviewed and is accurate.        Veryl Speak, MD 03/24/17 [phone]

## 2017-03-24 NOTE — ED Triage Notes (Signed)
Pt complains of being short of breath and bilateral feet swelling for several weeks, he states he's in town for the holiday and didn't feel good before he came Pt states he saw his doctor in [place] twice this month for the same

## 2017-03-24 NOTE — ED Notes (Signed)
Called again to give report w/o a response

## 2017-03-24 NOTE — H&P (Signed)
History and Physical    JIMEL MYLER OYD:[id number] DOB: January 10, 1934 DOA: 03/24/2017  Referring MD/NP/PA: Dr. Stark Jock PCP: System, Pcp Not In  Patient coming from: Home  Chief Complaint: Shortness of breath  HPI: [name] [name] is a 83 y.o. male with medical history significant of HTN, HLD, COPD, DM type II, polysubstance abuse, and gerd; who presents with complaints of shortness breath. Patient reports that he's had worsening shortness of breath with a intermittently productive cough and wheezing for one month now. Patient reports that is admitted into the hospital for one week in [place], [place] for similar symptoms and treated with antibiotics, inhaler, and steroids. However, he reports no relief of symptoms. He does not have a pulmonologist with whom he follows. He has been utilizing his home Advair, but it states that it's providing no relief. Patient reports that he still continues to smoke from time to time to calm his nerves.  ED Course: Patient was noted to have O2 saturations dropped as low as 88% on room air. Labs were relatively unremarkable. Chest x-ray showed bullous emphysema. Patient was given 125 mg of Solu-Medrol IV, 2 g of magnesium sulfate, and multiple breathing treatments without relief of symptoms. [facility] called to admit.  Review of Systems: Review of Systems  Constitutional: Positive for malaise/fatigue. Negative for chills and weight loss.  HENT: Negative for ear discharge and ear pain.   Eyes: Negative for photophobia and pain.  Respiratory: Positive for cough, sputum production, shortness of breath and wheezing.   Cardiovascular: Negative for chest pain and orthopnea.  Gastrointestinal: Negative for abdominal pain and diarrhea.  Genitourinary: Negative for frequency and urgency.  Musculoskeletal: Negative for back pain and falls.  Skin: Negative.  Negative for rash.  Neurological: Negative for sensory change, speech change, seizures and loss of consciousness.    Psychiatric/Behavioral: Negative for substance abuse. The patient is not nervous/anxious.     Past Medical History:  Diagnosis Date  . ABSCESS 12/03/2009  . ABSCESS, FINGER 04/07/2010  . ANXIETY 11/03/2009  . ASTHMA 11/03/2009  . CHRONIC OBSTRUCTIVE PULMONARY DISEASE, ACUTE EXACERBATION 11/03/2009  . COPD 11/03/2009  . DEPRESSION 11/03/2009  . DIABETES MELLITUS, TYPE II 11/03/2009  . [place] DISEASE, LUMBAR 11/03/2009  . EMPHYSEMA, BULLOUS 11/03/2009  . GERD 11/03/2009  . HYPERLIPIDEMIA 11/03/2009  . HYPERTENSION 11/03/2009  . Kidney stones 01/30/12   "I've had them 7 times; always have passed them"  . PEPTIC ULCER DISEASE 11/03/2009  . Pneumonia   . RASH-NONVESICULAR 11/03/2009  . RESTLESS LEG SYNDROME 11/03/2009  . Shortness of breath    "sometimes; at any time"  . SPINAL STENOSIS, LUMBAR 11/03/2009    Past Surgical History:  Procedure Laterality Date  . [place]   left  . INGUINAL HERNIA REPAIR  10/2011   left  . ROTATOR CUFF REPAIR  2003   left  . TONSILLECTOMY  1960     reports that he has been smoking Cigarettes.  He has a 41.00 pack-year smoking history. He has never used smokeless tobacco. He reports that he does not drink alcohol or use drugs.  No Known Allergies  Family History  Problem Relation Age of Onset  . Heart disease Father   . Heart disease Mother   . Cancer Brother        lung    Prior to Admission medications   Medication Sig Start Date End Date Taking? Authorizing Provider  albuterol (PROVENTIL HFA;VENTOLIN HFA) 108 (90 Base) MCG/ACT inhaler Inhale 1-2  puffs into the lungs every 6 (six) hours as needed for wheezing or shortness of breath.  06/03/13  Yes [provider]  atorvastatin (LIPITOR) 40 MG tablet Take 1 tablet (40 mg total) by mouth daily. 12/16/16  Yes Mody, Ulice Bold, MD  Fluticasone-Salmeterol (ADVAIR) 250-50 MCG/DOSE AEPB Inhale 1 puff into the lungs 2 (two) times daily. 12/05/12  Yes [provider]  lisinopril  (PRINIVIL,ZESTRIL) 10 MG tablet Take 10 mg by mouth daily.   Yes [provider]  metFORMIN (GLUCOPHAGE) 500 MG tablet Take 500 mg by mouth 2 (two) times daily with a meal.   Yes [provider]  escitalopram (LEXAPRO) 5 MG tablet Take 1 tablet (5 mg total) by mouth daily. Patient not taking: Reported on 03/24/2017 01/18/17   Vaughan Basta, MD  fluticasone furoate-vilanterol (BREO ELLIPTA) 200-25 MCG/INH AEPB Inhale 1 puff into the lungs daily. Patient not taking: Reported on 03/24/2017 01/18/17   Vaughan Basta, MD    Physical Exam:  Constitutional: Elderly male who appears to be in some mild respiratory distress  Vitals:   03/24/17 0346 03/24/17 0430 03/24/17 0446 03/24/17 0519  BP: 113/67 103/66  105/66  Pulse: (!) 104 98  92  Resp: 15 18  18   Temp:      TempSrc:      SpO2: 92% 92% 96% 92%   Eyes: PERRL, lids and conjunctivae normal ENMT: Mucous membranes are dry. Posterior pharynx clear of any exudate or lesions.Normal dentition.  Neck: normal, supple, no masses, no thyromegaly Respiratory: Decreased overall aeration with expiratory wheezes appreciated and rales  Cardiovascular: Regular rate and rhythm, no murmurs / rubs / gallops. No extremity edema. 2+ pedal pulses. No carotid bruits.   Abdomen: no tenderness, no masses palpated. No hepatosplenomegaly. Bowel sounds positive.  Musculoskeletal: no clubbing / cyanosis. No joint deformity upper and lower extremities. Good ROM, no contractures. Normal muscle tone.  Skin: no rashes, lesions, ulcers. No induration Neurologic: CN 2-12 grossly intact. Sensation intact, DTR normal. Strength 5/5 in all 4.  Psychiatric: Normal judgment and insight. Alert and oriented x 3. Normal mood.     Labs on Admission: I have personally reviewed following labs and imaging studies  CBC:  Recent Labs Lab 03/24/17 0145  WBC 9.8  HGB 14.0  HCT 40.9  MCV 89.7  PLT 542   Basic Metabolic Panel:  Recent Labs Lab  03/24/17 0145  NA 138  K 3.6  CL 105  CO2 26  GLUCOSE 148*  BUN 16  CREATININE 1.11  CALCIUM 8.4*   GFR: CrCl cannot be calculated (Unknown ideal weight.). Liver Function Tests: No results for input(s): AST, ALT, ALKPHOS, BILITOT, PROT, ALBUMIN in the last 168 hours. No results for input(s): LIPASE, AMYLASE in the last 168 hours. No results for input(s): AMMONIA in the last 168 hours. Coagulation Profile: No results for input(s): INR, PROTIME in the last 168 hours. Cardiac Enzymes: No results for input(s): CKTOTAL, CKMB, CKMBINDEX, TROPONINI in the last 168 hours. BNP (last 3 results) No results for input(s): PROBNP in the last 8760 hours. HbA1C: No results for input(s): HGBA1C in the last 72 hours. CBG: No results for input(s): GLUCAP in the last 168 hours. Lipid Profile: No results for input(s): CHOL, HDL, LDLCALC, TRIG, CHOLHDL, LDLDIRECT in the last 72 hours. Thyroid Function Tests: No results for input(s): TSH, T4TOTAL, FREET4, T3FREE, THYROIDAB in the last 72 hours. Anemia Panel: No results for input(s): VITAMINB12, FOLATE, FERRITIN, TIBC, IRON, RETICCTPCT in the last 72 hours. Urine analysis:  Component Value Date/Time   COLORURINE YELLOW (A) 01/15/2017 2108   APPEARANCEUR CLEAR (A) 01/15/2017 2108   APPEARANCEUR Hazy 05/15/2012 1605   LABSPEC 1.023 01/15/2017 2108   LABSPEC 1.020 05/15/2012 1605   PHURINE 6.0 01/15/2017 2108   GLUCOSEU 50 (A) 01/15/2017 2108   GLUCOSEU Negative 05/15/2012 1605   GLUCOSEU >=1000 11/03/2009 1019   HGBUR NEGATIVE 01/15/2017 2108   BILIRUBINUR NEGATIVE 01/15/2017 2108   BILIRUBINUR Negative 05/15/2012 [place] 01/15/2017 2108   PROTEINUR NEGATIVE 01/15/2017 2108   UROBILINOGEN 0.2 03/17/2012 0216   NITRITE POSITIVE (A) 01/15/2017 2108   LEUKOCYTESUR NEGATIVE 01/15/2017 2108   LEUKOCYTESUR Negative 05/15/2012 1605   Sepsis Labs: No results found for this or any previous visit (from the past 240 hour(s)).     Radiological Exams on Admission: Dg Chest 2 View  Result Date: 03/24/2017 CLINICAL DATA:  Shortness of breath than bilateral feet swelling for several weeks. EXAM: CHEST  2 VIEW COMPARISON:  Most recent radiographs 02/21/2017, multiple priors FINDINGS: Again seen hyperinflation and emphysema with bullous changes in the left upper lobe. Unchanged heart size and mediastinal contours. Bibasilar atelectasis and scarring. No convincing pulmonary edema. No pleural fluid. No pneumothorax. IMPRESSION: Bullous emphysema with bibasilar atelectasis/scarring. No definite superimposed acute abnormality. Electronically Signed   By: Jeb Levering M.D.   On: 03/24/2017 02:37    [facility]:[id number]  Assessment/Plan COPD/emphysema  exacerbation: acute. Patient presents with shortness of breath, cough, and wheezing. Chest x-ray showing bullous emphysema with no clear signs of infiltrate. - Admit to a MedSurg bed - Levaquin - DuoNeb's QID and prn SOB/Wheezing - Brovana and budesonide nebs  - Solu-Medrol IV 60 mg q 8hrs - mucinex  Hypoxia: O2 saturation is noted as low as 88% on arrival, but patient did not required to be placed on nasal cannula oxygen. - Continuous pulse oximetry with nasal cannula oxygen as needed for O2 saturations lower than 92% - may likely benefit from a 6 minute walk study prior to discharged  Essential hypertension - Continue lisinopril   Diabetes mellitus type 2 - Hypoglycemic protocol - Hold metformin - CBGs every before meals and daily at bedtime with sensitive SSI  History of polysubstance abuse: Question cause of current exacerbations. Patient with previous history of positive drug screen for cocaine, barbiturates ,and benzos. - Check UDS  Tobacco abuse: Patient reports continued smoking of cigarettes from time to time. - Counseled on the need for cessation of tobacco   DVT prophylaxis: lovenox Code Status: DNR  Family Communication: No family present at  bedside Disposition Plan: likely discharged home in 1-2 days  Consults called: none Admission status: Observation  Norval Morton MD [facility] Pager [phone]  If 7PM-7AM, please contact night-coverage [email or web address] Password [facility]  03/24/2017, 5:33 AM

## 2017-03-24 NOTE — ED Notes (Signed)
Called 5E x1 without response

## 2017-03-24 NOTE — Evaluation (Signed)
Physical Therapy Evaluation Patient Details Name: [name] [name] MRN: [id number] DOB: [birth year] Today's Date: 03/24/2017   History of Present Illness  [name] [name] is a 83 y.o. male with medical history significant of HTN, HLD, COPD, DM type II, polysubstance abuse, and GERD; who presents with complaints of shortness breath. Patient reports that he's had worsening shortness of breath with a intermittently productive cough and wheezing for one month now. Patient reports that is admitted into the hospital for one week in [place], [place] for similar symptoms and treated with antibiotics, inhaler, and steroids. Pt reports he received oxygen from hospital in [place] to continue traveling.  Pt has been visiting friends/family in [place], [place].  Pt lives in [place], [place].  Clinical Impression  Pt admitted with above diagnosis. Pt currently with functional limitations due to the deficits listed below (see PT Problem List).  Pt will benefit from skilled PT to increase their independence and safety with mobility to allow discharge to the venue listed below.  Pt reports receiving oxygen from hospital in [place], [place] to continue his driving trip.  Pt was not on oxygen upon entering room (pt states he took off to use restroom) and SPO2 96% on room air at rest.  Pt assisted with ambulating in hallway and unsteady however declined using RW.  SpO2 95% on room air during gait.  Pt reports mild SOB but states "that's nothing new."     Follow Up Recommendations Home health PT;Supervision - Intermittent    Equipment Recommendations  Rolling walker with 5" wheels (pt reluctant to use RW)    Recommendations for Other Services       Precautions / Restrictions Precautions Precautions: Fall Precaution Comments: recent O2 needs      Mobility  Bed Mobility Overal bed mobility: Modified Independent                Transfers Overall transfer level: Needs assistance Equipment used: None Transfers: Sit  to/from Stand Sit to Stand: Min guard Stand pivot transfers: Supervision       General transfer comment: required 3 attempts reporting weak, unsteady legs  Ambulation/Gait Ambulation/Gait assistance: Min assist Ambulation Distance (Feet): 240 Feet Assistive device: None Gait Pattern/deviations: Step-through pattern;Decreased stride length;Narrow base of support;Drifts right/left     General Gait Details: pt with unsteady gait, occasionally reaching for objects to self steady, did not want to use RW ("makes me look old"), assist for occasional steadying, SPO2 95% room air during ambulation  Stairs            Wheelchair Mobility    Modified Rankin (Stroke Patients Only)       Balance Overall balance assessment: Needs assistance         Standing balance support: No upper extremity supported Standing balance-Leahy Scale: Fair                               Pertinent Vitals/Pain Pain Assessment: No/denies pain    Home Living Family/patient expects to be discharged to:: Private residence Living Arrangements: Other relatives Available Help at Discharge: Family   Home Access: Level entry     Home Layout: One level Home Equipment: None Additional Comments: pt here visiting family    Prior Function Level of Independence: Independent         Comments: Independent with ADLs, IADLs, driving long distances.      Hand Dominance   Dominant Hand: Right  Extremity/Trunk Assessment   Upper Extremity Assessment Upper Extremity Assessment: Generalized weakness    Lower Extremity Assessment Lower Extremity Assessment: Generalized weakness       Communication   Communication: No difficulties  Cognition Arousal/Alertness: Awake/alert Behavior During Therapy: WFL for tasks assessed/performed Overall Cognitive Status: Within Functional Limits for tasks assessed                                        General Comments       Exercises     Assessment/Plan    PT Assessment Patient needs continued PT services  PT Problem List Decreased mobility;Decreased knowledge of use of DME;Decreased balance;Decreased activity tolerance       PT Treatment Interventions Gait training;DME instruction;Therapeutic activities;Therapeutic exercise;Functional mobility training;Patient/family education    PT Goals (Current goals can be found in the Care Plan section)  Acute Rehab PT Goals Patient Stated Goal: drive back to [facility] PT Goal Formulation: With patient Time For Goal Achievement: 03/31/17 Potential to Achieve Goals: Good    Frequency Min 3X/week   Barriers to discharge        Co-evaluation               AM-PAC PT "6 Clicks" Daily Activity  Outcome Measure Difficulty turning over in bed (including adjusting bedclothes, sheets and blankets)?: None Difficulty moving from lying on back to sitting on the side of the bed? : None Difficulty sitting down on and standing up from a chair with arms (e.g., wheelchair, bedside commode, etc,.)?: None Help needed moving to and from a bed to chair (including a wheelchair)?: A Little Help needed walking in hospital room?: A Little Help needed climbing 3-5 steps with a railing? : A Little 6 Click Score: 21    End of Session Equipment Utilized During Treatment: Gait belt Activity Tolerance: Patient tolerated treatment well Patient left: in bed;with call bell/phone within reach Nurse Communication: Mobility status PT Visit Diagnosis: Difficulty in walking, not elsewhere classified (R26.2)    Time: 3817-7116 PT Time Calculation (min) (ACUTE ONLY): 28 min   Charges:   PT Evaluation $PT Eval Low Complexity: 1 Procedure     PT G Codes:   PT G-Codes **NOT FOR INPATIENT CLASS** Functional Assessment Tool Used: AM-PAC 6 Clicks Basic Mobility;Clinical judgement Functional Limitation: Mobility: Walking and moving around Mobility: Walking and Moving Around Current  Status (F7903): At least 20 percent but less than 40 percent impaired, limited or restricted Mobility: Walking and Moving Around Goal Status [phone]): At least 1 percent but less than 20 percent impaired, limited or restricted    Carmelia Bake, PT, DPT 03/24/2017 Pager: [phone]   York Ram E 03/24/2017, 3:22 PM

## 2017-03-24 NOTE — Evaluation (Signed)
Occupational Therapy Evaluation Patient Details Name: [name] [name] MRN: [id number] DOB: 05-May-1934 Today's Date: 03/24/2017    History of Present Illness [name] [name] is a 83 y.o. male with medical history significant of HTN, HLD, COPD, DM type II, polysubstance abuse, and gerd; who presents with complaints of shortness breath. Patient reports that he's had worsening shortness of breath with a intermittently productive cough and wheezing for one month now. Patient reports that is admitted into the hospital for one week in [place], [place] for similar symptoms and treated with antibiotics, inhaler, and steroids. However, he reports no relief of symptoms   Clinical Impression   Pt admitted with shortness of breath. Pt currently with functional limitations due to the deficits listed below (see OT Problem List). Pt will benefit from skilled OT to increase their safety and independence with ADL and functional mobility for ADL to facilitate discharge to venue listed below.      Follow Up Recommendations  No OT follow up    Equipment Recommendations  None recommended by OT       Precautions / Restrictions Precautions Precaution Comments: oxygen      Mobility Bed Mobility Overal bed mobility: Modified Independent                Transfers Overall transfer level: Needs assistance   Transfers: Sit to/from Stand;Stand Pivot Transfers Sit to Stand: Supervision Stand pivot transfers: Supervision       General transfer comment: VC for safety and managing oxygen cord        ADL either performed or assessed with clinical judgement   ADL Overall ADL's : Needs assistance/impaired Eating/Feeding: Set up;Sitting   Grooming: Standing;Supervision/safety   Upper Body Bathing: Set up;Sitting   Lower Body Bathing: Min guard;Sit to/from stand;Cueing for safety   Upper Body Dressing : Set up;Sitting       Toilet Transfer: Min guard;Ambulation   Toileting- Clothing  Manipulation and Hygiene: Minimal assistance;Sit to/from stand   Tub/ Banker: Minimal assistance     General ADL Comments: Pt fatigues quickly and will benefit from energy conservation education      Vision Patient Visual Report: No change from baseline              Pertinent Vitals/Pain Pain Assessment: No/denies pain     Hand Dominance Right   Extremity/Trunk Assessment Upper Extremity Assessment Upper Extremity Assessment: Generalized weakness           Communication Communication Communication: No difficulties   Cognition Arousal/Alertness: Awake/alert Behavior During Therapy: WFL for tasks assessed/performed Overall Cognitive Status: Within Functional Limits for tasks assessed                                                Home Living Family/patient expects to be discharged to:: Private residence Living Arrangements: Other relatives Available Help at Discharge: Family   Home Access: Level entry     Home Layout: One level     Bathroom Shower/Tub: Teacher, early years/pre: Standard     Home Equipment: None   Additional Comments: pt here visiting family      Prior Functioning/Environment Level of Independence: Independent        Comments: Independent with ADLs, IADLs, driving long distances.         OT Problem List: Decreased strength;Decreased activity tolerance;Cardiopulmonary status limiting  activity      OT Treatment/Interventions: Self-care/ADL training;DME and/or AE instruction;Patient/family education    OT Goals(Current goals can be found in the care plan section) Acute Rehab OT Goals Patient Stated Goal: drive back to [facility] OT Goal Formulation: With patient Time For Goal Achievement: 04/07/17  OT Frequency: Min 2X/week   Barriers to D/C: Decreased caregiver support             AM-PAC PT "6 Clicks" Daily Activity     Outcome Measure Help from another person eating meals?: None Help  from another person taking care of personal grooming?: None Help from another person toileting, which includes using toliet, bedpan, or urinal?: A Little Help from another person bathing (including washing, rinsing, drying)?: A Little Help from another person to put on and taking off regular upper body clothing?: None Help from another person to put on and taking off regular lower body clothing?: A Little 6 Click Score: 21   End of Session Nurse Communication: Mobility status  Activity Tolerance: Patient tolerated treatment well Patient left: in chair;with call bell/phone within reach  OT Visit Diagnosis: Unsteadiness on feet (R26.81);History of falling (Z91.81)                Time: 6256-3893 OT Time Calculation (min): 30 min Charges:  OT General Charges $OT Visit: 1 Procedure OT Evaluation $OT Eval Moderate Complexity: 1 Procedure OT Treatments $Self Care/Home Management : 8-22 mins G-Codes:     Kari Baars, [place]  Payton Mccallum D 03/24/2017, 12:46 PM

## 2017-03-25 DIAGNOSIS — Z8249 Family history of ischemic heart disease and other diseases of the circulatory system: Secondary | ICD-10-CM | POA: Diagnosis not present

## 2017-03-25 DIAGNOSIS — J441 Chronic obstructive pulmonary disease with (acute) exacerbation: Secondary | ICD-10-CM | POA: Diagnosis present

## 2017-03-25 DIAGNOSIS — J9621 Acute and chronic respiratory failure with hypoxia: Secondary | ICD-10-CM | POA: Diagnosis present

## 2017-03-25 DIAGNOSIS — R0902 Hypoxemia: Secondary | ICD-10-CM | POA: Diagnosis not present

## 2017-03-25 DIAGNOSIS — Z9889 Other specified postprocedural states: Secondary | ICD-10-CM | POA: Diagnosis not present

## 2017-03-25 DIAGNOSIS — Z8673 Personal history of transient ischemic attack (TIA), and cerebral infarction without residual deficits: Secondary | ICD-10-CM | POA: Diagnosis not present

## 2017-03-25 DIAGNOSIS — L8990 Pressure ulcer of unspecified site, unspecified stage: Secondary | ICD-10-CM | POA: Diagnosis present

## 2017-03-25 DIAGNOSIS — Z7951 Long term (current) use of inhaled steroids: Secondary | ICD-10-CM | POA: Diagnosis not present

## 2017-03-25 DIAGNOSIS — Z801 Family history of malignant neoplasm of trachea, bronchus and lung: Secondary | ICD-10-CM | POA: Diagnosis not present

## 2017-03-25 DIAGNOSIS — E119 Type 2 diabetes mellitus without complications: Secondary | ICD-10-CM | POA: Diagnosis present

## 2017-03-25 DIAGNOSIS — Z720 Tobacco use: Secondary | ICD-10-CM | POA: Diagnosis not present

## 2017-03-25 DIAGNOSIS — I10 Essential (primary) hypertension: Secondary | ICD-10-CM | POA: Diagnosis present

## 2017-03-25 DIAGNOSIS — Z79899 Other long term (current) drug therapy: Secondary | ICD-10-CM | POA: Diagnosis not present

## 2017-03-25 DIAGNOSIS — G2581 Restless legs syndrome: Secondary | ICD-10-CM | POA: Diagnosis present

## 2017-03-25 DIAGNOSIS — Z87442 Personal history of urinary calculi: Secondary | ICD-10-CM | POA: Diagnosis not present

## 2017-03-25 DIAGNOSIS — K219 Gastro-esophageal reflux disease without esophagitis: Secondary | ICD-10-CM | POA: Diagnosis present

## 2017-03-25 DIAGNOSIS — F1910 Other psychoactive substance abuse, uncomplicated: Secondary | ICD-10-CM | POA: Diagnosis not present

## 2017-03-25 DIAGNOSIS — F17210 Nicotine dependence, cigarettes, uncomplicated: Secondary | ICD-10-CM | POA: Diagnosis present

## 2017-03-25 DIAGNOSIS — Z66 Do not resuscitate: Secondary | ICD-10-CM | POA: Diagnosis present

## 2017-03-25 DIAGNOSIS — Z7984 Long term (current) use of oral hypoglycemic drugs: Secondary | ICD-10-CM | POA: Diagnosis not present

## 2017-03-25 DIAGNOSIS — Z8711 Personal history of peptic ulcer disease: Secondary | ICD-10-CM | POA: Diagnosis not present

## 2017-03-25 DIAGNOSIS — E785 Hyperlipidemia, unspecified: Secondary | ICD-10-CM | POA: Diagnosis present

## 2017-03-25 LAB — GLUCOSE, CAPILLARY
GLUCOSE-CAPILLARY: 160 mg/dL — AB (ref 65–99)
Glucose-Capillary: 147 mg/dL — ABNORMAL HIGH (ref 65–99)
Glucose-Capillary: 91 mg/dL (ref 65–99)
Glucose-Capillary: 165 mg/dL — ABNORMAL HIGH (ref 65–99)

## 2017-03-25 LAB — BASIC METABOLIC PANEL
Anion gap: 8 (ref 5–15)
BUN: 18 mg/dL (ref 6–20)
CHLORIDE: 104 mmol/L (ref 101–111)
CO2: 26 mmol/L (ref 22–32)
CREATININE: 0.88 mg/dL (ref 0.61–1.24)
Calcium: 8.8 mg/dL — ABNORMAL LOW (ref 8.9–10.3)
GFR calc Af Amer: >60 mL/min (ref >60)
GFR calc non Af Amer: >60 mL/min (ref >60)
GLUCOSE: 146 mg/dL — AB (ref 65–99)
Potassium: 4.3 mmol/L (ref 3.5–5.1)
Sodium: 138 mmol/L (ref 135–145)

## 2017-03-25 LAB — CBC
HEMATOCRIT: 39.6 % (ref 39.0–52.0)
HEMOGLOBIN: 13.8 g/dL (ref 13.0–17.0)
MCH: 30.5 pg (ref 26.0–34.0)
MCHC: 34.8 g/dL (ref 30.0–36.0)
MCV: 87.4 fL (ref 78.0–100.0)
Platelets: 183 10*3/uL (ref 150–400)
RBC: 4.53 MIL/uL (ref 4.22–5.81)
RDW: 14.1 % (ref 11.5–15.5)
WBC: 11.2 10*3/uL — ABNORMAL HIGH (ref 4.0–10.5)

## 2017-03-25 MED ORDER — METHYLPREDNISOLONE SODIUM SUCC 40 MG IJ SOLR
20.0000 mg | Freq: Every day | INTRAMUSCULAR | Status: DC
  Administered 2017-03-26: 20 mg via INTRAVENOUS
  Filled 2017-03-25: qty 1

## 2017-03-25 NOTE — Progress Notes (Signed)
Physical Therapy Treatment Patient Details Name: [name] [name] MRN: [id number] DOB: 07/28/1934 Today's Date: 03/25/2017    History of Present Illness AVEL OGAWA is a 83 y.o. male with medical history significant of HTN, HLD, COPD, DM type II, polysubstance abuse, and GERD; who presents with complaints of shortness breath. Patient reports that he's had worsening shortness of breath with a intermittently productive cough and wheezing for one month now. Patient reports that is admitted into the hospital for one week in [place], [place] for similar symptoms and treated with antibiotics, inhaler, and steroids. Pt reports he received oxygen from hospital in [place] to continue traveling.  Pt has been visiting friends/family in [place], [place].  Pt lives in [place], [place].    PT Comments    Pt c/o general weakness on today. He does not feel ready to d/c home-encouraged pt to discuss this with MD. Pt's gait remains unsteady, especially without use of an assistive device. He was agreeable to using a walker on today.  Will continue to follow and progress activity as tolerated. O2 sat 95% on RA at rest, 92% on RA during ambulation. Pt requested [place] O2 reapplied at end of session.    Follow Up Recommendations  Home health PT;Supervision - Intermittent     Equipment Recommendations   4 wheeled rolling walker   Recommendations for Other Services       Precautions / Restrictions Precautions Precautions: Fall Precaution Comments: recent O2 needs Restrictions Weight Bearing Restrictions: No    Mobility  Bed Mobility Overal bed mobility: Modified Independent                Transfers Overall transfer level: Needs assistance   Transfers: Sit to/from Stand Sit to Stand: Min guard         General transfer comment: close guard for safety. Increased time.   Ambulation/Gait Ambulation/Gait assistance: Min assist;Min guard Ambulation Distance (Feet): 125 Feet (125'x1, 60'x1) Assistive  device: None;4-wheeled walker Gait Pattern/deviations: Step-through pattern;Decreased stride length;Staggering left;Staggering right;Drifts right/left     General Gait Details: Unsteady gait without use of DME-Walked x1 (60 feet)-Min assist. Walked x1 with use of rollator (125 feet)-Min guard assist. O2 sat 92% on RA.    Stairs            Wheelchair Mobility    Modified Rankin (Stroke Patients Only)       Balance Overall balance assessment: Needs assistance           Standing balance-Leahy Scale: Fair                              Cognition Arousal/Alertness: Awake/alert Behavior During Therapy: WFL for tasks assessed/performed Overall Cognitive Status: Within Functional Limits for tasks assessed                                        Exercises      General Comments        Pertinent Vitals/Pain Pain Assessment: No/denies pain    Home Living                      Prior Function            PT Goals (current goals can now be found in the care plan section) Progress towards PT goals: Progressing toward goals  Frequency    Min 3X/week      PT Plan      Co-evaluation              AM-PAC PT "6 Clicks" Daily Activity  Outcome Measure  Difficulty turning over in bed (including adjusting bedclothes, sheets and blankets)?: None Difficulty moving from lying on back to sitting on the side of the bed? : None Difficulty sitting down on and standing up from a chair with arms (e.g., wheelchair, bedside commode, etc,.)?: None Help needed moving to and from a bed to chair (including a wheelchair)?: A Little Help needed walking in hospital room?: A Little Help needed climbing 3-5 steps with a railing? : A Little 6 Click Score: 21    End of Session Equipment Utilized During Treatment: Gait belt Activity Tolerance: Patient limited by fatigue Patient left: in bed;with call bell/phone within reach   PT Visit  Diagnosis: Muscle weakness (generalized) (M62.81);Difficulty in walking, not elsewhere classified (R26.2)     Time: 8309-4076 PT Time Calculation (min) (ACUTE ONLY): 10 min  Charges:  $Gait Training: 8-22 mins                    G Codes:          Weston Anna, MPT Pager: [phone]

## 2017-03-25 NOTE — Care Management Note (Signed)
Case Management Note  Patient Details  Name: [name] [name] MRN: [id number] Date of Birth: 1934/05/14  Subjective/Objective:      COPD exac               Action/Plan: Discharge Planning: NCM spoke to pt and states he has his portable tanks in the car. He has neb machine at home. States he is here visiting his brother in [place]. His Oxygen is with [facility].    Expected Discharge Date:               Expected Discharge Plan:  Home/Self Care  In-House Referral:  NA  Discharge planning Services  CM Consult  Post Acute Care Choice:  NA Choice offered to:  NA  DME Arranged:  N/A DME Agency:  NA  HH Arranged:  NA HH Agency:  NA  Status of Service:  Completed, signed off  If discussed at [place] of Stay Meetings, dates discussed:    Additional Comments:  Erenest Rasher, RN 03/25/2017, 3:07 PM

## 2017-03-25 NOTE — Care Management Obs Status (Signed)
[facility] NOTIFICATION   Patient Details  Name: LATTIE CERVI MRN: [id number] Date of Birth: 08/17/1934   Medicare Observation Status Notification Given:  Yes    Erenest Rasher, RN 03/25/2017, 3:02 PM

## 2017-03-25 NOTE — Progress Notes (Signed)
Patient came to nursing station very upset stating that he wanted to sign himself out. Rn walked to pt room and discussed what was wrong. Patient states he had bad night last night and felt like he wasn't receiving the kind of care he receives at his hospital in [place]. Patient is adamant to go home. RN discussed various factors of why patient was upset and finally patient calmed down and is agreeable to care after RN explained importance of him staying and receiving his care to get better. RN reminded patient that her number is on white board and for him to call for any needs and patient is agreeable. Will c/t monitor.

## 2017-03-25 NOTE — Progress Notes (Signed)
PROGRESS NOTE    [name] [name]  OZH:[id number] DOB: 1934/05/07 DOA: 03/24/2017 PCP: System, Pcp Not In    Brief Narrative:  83 year old male who presented with shortness of breath. Patient is known to have hypertension, dyslipidemia, COPD, type 2 diabetes mellitus, polysubstance abuse and GERD. Patient complained of worsening dyspnea for the last 4 weeks, associated with productive coughing and wheezing. Recent hospitalization for same symptoms in [place], apparently he received bronchodilators, antibiotics and steroids. Patient's symptoms have been refractive to the use of his home bronchodilators, continue to smoke cigarettes. On initial physical examination his oxygen saturation was 88% on room air, improved to 92% on supplemental oxygen, blood pressure 113/67, heart rate 104, respiratory 15-18, dry mucous membranes, lungs with decrease air movement bilaterally, expiratory wheezing, positive rales, no rhonchi. Heart S1-S2 present and rhythmic, no gallops or murmurs, abdomen soft and nontender, no lower extremity edema. Sodium 138, potassium 3.6, chloride 105, bicarbonate 26, glucose 148, BUN 16, creatinine 1.11, white count 9.8, [place] 14.0, hematocrit 40.9, platelets 197. Chest x-ray with significant hyperinflation, but I notice apical lobes, scarring at bases bilaterally, no pneumothorax. EKG was sinus tachycardia.   Patient admitted to the hospital working diagnosis of COPD exacerbation, complicated by acute hypoxic respiratory failure.   Assessment & Plan:   Principal Problem:   COPD exacerbation ([place]) Active Problems:   Tobacco abuse   Hypoxia   Diabetes mellitus type 2 in nonobese (HCC)   Polysubstance abuse   Pressure injury of skin  1. COPD exacerbation with acute on chronic hypoxic respiratory failure. Mild symptomatic improvement in dyspnea, persistent generalized weakness, will reduce dose of systemic steroids,continue bronchodilator therapy with duoneb, continue macrolide  for 5 days. Will need home health. Patient on home 02.    2. HTN. Systolic blood pressure 629. Holding ace inh.  3. T2DM. Capillary glucose 182, 192, 126, 147, 91. Tolerating well po, will continue glucose cover and monitoring, with insulin sliding scale.   4. Tobacco and substance abuse. Smoking cessation.   Will plan to discharge patient in am, if continue clinical improvement.    DVT prophylaxis: enoxaparin  Code Status: full  Family Communication:  Disposition Plan:    Consultants:     Procedures:     Antimicrobials:   Azithromycin.    Subjective: Dyspnea has improved, but persistent weakness, no nausea or vomiting, no chest pain. Tolerating po well.   Objective: Vitals:   03/24/17 2002 03/24/17 2128 03/25/17 0519 03/25/17 0812  BP:  115/62 118/63   Pulse:  (!) 110 99   Resp:   18   Temp:  98 F (36.7 C) 98.1 F (36.7 C)   TempSrc:  Oral Oral   SpO2: 97% 96% 96% 97%  Weight:        Intake/Output Summary (Last 24 hours) at 03/25/17 1028 Last data filed at 03/25/17 0700  Gross per 24 hour  Intake              600 ml  Output                0 ml  Net              600 ml   Filed Weights   03/24/17 0906  Weight: 71.1 kg (156 lb 12 oz)    Examination:  General exam: deconditioned E ENT: mild pallor, no icterus, oral mucosa moist.  Respiratory system: decreased breath sounds bilaterally, with scattered rales, no wheezing or rhonchi. Respiratory effort normal. Cardiovascular system:  S1 & S2 heard, RRR. No JVD, murmurs, rubs, gallops or clicks. No pedal edema. Gastrointestinal system: Abdomen is nondistended, soft and nontender. No organomegaly or masses felt. Normal bowel sounds heard. Central nervous system: Alert and oriented. No focal neurological deficits. Extremities: Symmetric 5 x 5 power. Skin: No rashes, lesions or ulcers     Data Reviewed: I have personally reviewed following labs and imaging studies  CBC:  Recent Labs Lab  03/24/17 0145 03/25/17 0544  WBC 9.8 11.2*  HGB 14.0 13.8  HCT 40.9 39.6  MCV 89.7 87.4  PLT 197 485   Basic Metabolic Panel:  Recent Labs Lab 03/24/17 0145 03/25/17 0544  NA 138 138  K 3.6 4.3  CL 105 104  CO2 26 26  GLUCOSE 148* 146*  BUN 16 18  CREATININE 1.11 0.88  CALCIUM 8.4* 8.8*   GFR: Estimated Creatinine Clearance: 67.3 mL/min (by C-G formula based on SCr of 0.88 mg/dL). Liver Function Tests: No results for input(s): AST, ALT, ALKPHOS, BILITOT, PROT, ALBUMIN in the last 168 hours. No results for input(s): LIPASE, AMYLASE in the last 168 hours. No results for input(s): AMMONIA in the last 168 hours. Coagulation Profile: No results for input(s): INR, PROTIME in the last 168 hours. Cardiac Enzymes: No results for input(s): CKTOTAL, CKMB, CKMBINDEX, TROPONINI in the last 168 hours. BNP (last 3 results) No results for input(s): PROBNP in the last 8760 hours. HbA1C: No results for input(s): HGBA1C in the last 72 hours. CBG:  Recent Labs Lab 03/24/17 0843 03/24/17 1156 03/24/17 1655 03/24/17 2127 03/25/17 0706  GLUCAP 190* 182* 192* 126* 147*   Lipid Profile: No results for input(s): CHOL, HDL, LDLCALC, TRIG, CHOLHDL, LDLDIRECT in the last 72 hours. Thyroid Function Tests: No results for input(s): TSH, T4TOTAL, FREET4, T3FREE, THYROIDAB in the last 72 hours. Anemia Panel: No results for input(s): VITAMINB12, FOLATE, FERRITIN, TIBC, IRON, RETICCTPCT in the last 72 hours. Sepsis Labs: No results for input(s): PROCALCITON, LATICACIDVEN in the last 168 hours.  No results found for this or any previous visit (from the past 240 hour(s)).       Radiology Studies: Dg Chest 2 View  Result Date: 03/24/2017 CLINICAL DATA:  Shortness of breath than bilateral feet swelling for several weeks. EXAM: CHEST  2 VIEW COMPARISON:  Most recent radiographs 02/21/2017, multiple priors FINDINGS: Again seen hyperinflation and emphysema with bullous changes in the left  upper lobe. Unchanged heart size and mediastinal contours. Bibasilar atelectasis and scarring. No convincing pulmonary edema. No pleural fluid. No pneumothorax. IMPRESSION: Bullous emphysema with bibasilar atelectasis/scarring. No definite superimposed acute abnormality. Electronically Signed   By: Jeb Levering M.D.   On: 03/24/2017 02:37        Scheduled Meds: . arformoterol  15 mcg Nebulization BID  . atorvastatin  40 mg Oral q1800  . azithromycin  500 mg Oral Daily  . budesonide (PULMICORT) nebulizer solution  0.5 mg Nebulization BID  . chlorhexidine  15 mL Mouth Rinse BID  . enoxaparin (LOVENOX) injection  40 mg Subcutaneous Q24H  . guaiFENesin  600 mg Oral BID  . insulin aspart  0-5 Units Subcutaneous QHS  . insulin aspart  0-9 Units Subcutaneous TID WC  . ipratropium-albuterol  3 mL Nebulization QID  . mouth rinse  15 mL Mouth Rinse q12n4p  . methylPREDNISolone (SOLU-MEDROL) injection  40 mg Intravenous Q12H   Continuous Infusions:   LOS: 0 days        Dakia Schifano Gerome Apley, MD [facility] Pager [phone]  If 7PM-7AM, please contact night-coverage [email or web address] Password [email or web address] 03/25/2017, 10:28 AM

## 2017-03-26 LAB — GLUCOSE, CAPILLARY
GLUCOSE-CAPILLARY: 125 mg/dL — AB (ref 65–99)
Glucose-Capillary: 89 mg/dL (ref 65–99)

## 2017-03-26 MED ORDER — LISINOPRIL 10 MG PO TABS
10.0000 mg | ORAL_TABLET | Freq: Every day | ORAL | Status: DC
  Administered 2017-03-26: 10 mg via ORAL
  Filled 2017-03-26: qty 1

## 2017-03-26 MED ORDER — IPRATROPIUM-ALBUTEROL 0.5-2.5 (3) MG/3ML IN SOLN: 3.0000 mL | Freq: Four times a day (QID) | RESPIRATORY_TRACT | 0 refills | Status: DC | PRN

## 2017-03-26 MED ORDER — AZITHROMYCIN 500 MG PO TABS: 500.0000 mg | ORAL_TABLET | Freq: Every day | ORAL | 0 refills | Status: AC

## 2017-03-26 NOTE — Progress Notes (Signed)
Patient discharged to home. DC instructions given. No concerns voiced. Prescription given. Left unit in wheelchair pushed by this Probation officer. Pt pushed across parking lot to car. Left campus in good condition. [name] [name].

## 2017-03-26 NOTE — Discharge Summary (Signed)
Physician Discharge Summary  [name] [name] [id number] DOB: 22-Feb-1934 DOA: 03/24/2017  PCP: System, Pcp Not In  Admit date: 03/24/2017 Discharge date: 03/26/2017  Admitted From:  Home  Disposition:  Home   Recommendations for Outpatient Follow-up:  1. Follow up with PCP in 1- week 2. Patient was placed on albuterol-ipratropium nebs 3. Nebulizer machine has been prescribed 4. Azithromycin for 5 days   Home Health: No  Equipment/Devices: Home 02   Discharge Condition: stable  CODE STATUS: Full  Diet recommendation: Heart Healthy / Carb Modified    Brief/Interim Summary: 83 year old male who presented with shortness of breath. Patient is known to have hypertension, dyslipidemia, COPD, type 2 diabetes mellitus, polysubstance abuse and GERD. Patient complainedof worsening dyspnea for the last 4 weeks, associated with productive coughing and wheezing. Recent hospitalization for same symptoms in [place],apparently he received bronchodilators, antibiotics and steroids. Patient's symptoms have been refractive to the use of his home bronchodilators, continue to smoke cigarettes. On initial physical examination his oxygen saturation was 88% on room air, improved to 92% on supplemental oxygen, blood pressure 113/67, heart rate 104, respiratory 15-18, dry mucous membranes, lungs with decrease air movement bilaterally, expiratory wheezing, positive rales, no rhonchi. Heart S1-S2 present and rhythmic, no gallops or murmurs, abdomensoft and nontender, no lower extremity edema. Sodium 138, potassium 3.6, chloride 105, bicarbonate 26, glucose 148, BUN 16, creatinine 1.11, white count 9.8, Hb 14.0, hematocrit 40.9, platelets 197. Chest x-ray with significant hyperinflation, emphysematous changes at the apical lobes, scarring at bases bilaterally, no pneumothorax. EKG was sinus tachycardia.   Patient admitted to the hospital working diagnosis of COPD exacerbation, complicated by acute on chronic  hypoxic respiratory failure.  1. COPD exacerbation with acute on chronic hypoxic respiratory failure. Patient was admitted to the medical ward, oximetry monitoring and supplemental oxygen per nasal cannula. He was placed on aggressive bronchodilator therapy with DuoNeb, systemic steroids and macrolide antibiotic therapy. He responded well to the medical therapy, deemed stable to be discharged on July 8. A nebulizer machine has been prescribed so patient can use DuoNeb at home.   2. HTN. Antihypertensive agents were held during his hospitalization. Discharge systolicblood pressure 481, patient will resume 10 mg of lisinopril daily.  3. T2DM. Patient was placed on insulin sliding scale for glucose coverage and monitoring. Capillary glucose 91, 165, 160, 89. Metformin will be resumed at discharge.   4. Tobacco abuse.  Smoking cessation counseling.   Discharge Diagnoses:  Principal Problem:   COPD exacerbation ([place]) Active Problems:   Tobacco abuse   Hypoxia   Diabetes mellitus type 2 in nonobese (HCC)   Polysubstance abuse   Pressure injury of skin    Discharge Instructions   Allergies as of 03/26/2017   No Known Allergies     Medication List    STOP taking these medications   escitalopram 5 MG tablet Commonly known as:  LEXAPRO   fluticasone furoate-vilanterol 200-25 MCG/INH Aepb Commonly known as:  BREO ELLIPTA     TAKE these medications   albuterol 108 (90 Base) MCG/ACT inhaler Commonly known as:  PROVENTIL HFA;VENTOLIN HFA Inhale 1-2 puffs into the lungs every 6 (six) hours as needed for wheezing or shortness of breath.   atorvastatin 40 MG tablet Commonly known as:  LIPITOR Take 1 tablet (40 mg total) by mouth daily.   azithromycin 500 MG tablet Commonly known as:  ZITHROMAX Take 1 tablet (500 mg total) by mouth daily.   Fluticasone-Salmeterol 250-50 MCG/DOSE Aepb Commonly known as:  ADVAIR Inhale 1 puff into the lungs 2 (two) times daily.    ipratropium-albuterol 0.5-2.5 (3) MG/3ML Soln Commonly known as:  DUONEB Take 3 mLs by nebulization every 6 (six) hours as needed (as needed for shortness of breath).   lisinopril 10 MG tablet Commonly known as:  PRINIVIL,ZESTRIL Take 10 mg by mouth daily.   metFORMIN 500 MG tablet Commonly known as:  GLUCOPHAGE Take 500 mg by mouth 2 (two) times daily with a meal.            Durable Medical Equipment        Start     Ordered   03/26/17 0000  DME Nebulizer machine    Question:  Patient needs a nebulizer to treat with the following condition  Answer:  Bronchitis   03/26/17 1037      No Known Allergies  Consultations:     Procedures/Studies: Dg Chest 2 View  Result Date: 03/24/2017 CLINICAL DATA:  Shortness of breath than bilateral feet swelling for several weeks. EXAM: CHEST  2 VIEW COMPARISON:  Most recent radiographs 02/21/2017, multiple priors FINDINGS: Again seen hyperinflation and emphysema with bullous changes in the left upper lobe. Unchanged heart size and mediastinal contours. Bibasilar atelectasis and scarring. No convincing pulmonary edema. No pleural fluid. No pneumothorax. IMPRESSION: Bullous emphysema with bibasilar atelectasis/scarring. No definite superimposed acute abnormality. Electronically Signed   By: Jeb Levering M.D.   On: 03/24/2017 02:37       Subjective: Patient feeling much better, dyspnea close to baseline, no nausea or vomiting, no chest pain. Has been out of bed.   Discharge Exam: Vitals:   03/25/17 2101 03/26/17 0519  BP: (!) 145/79 140/84  Pulse: 95 90  Resp: 18 18  Temp: 97.8 F (36.6 C) 97.7 F (36.5 C)   Vitals:   03/25/17 2035 03/25/17 2101 03/26/17 0519 03/26/17 0813  BP:  (!) 145/79 140/84   Pulse:  95 90   Resp:  18 18   Temp:  97.8 F (36.6 C) 97.7 F (36.5 C)   TempSrc:  Oral Oral   SpO2: 98% 100% 98% 94%  Weight:        General: Pt is alert, awake, not in acute distress E ENT. No pallor or icterus,  oral mucosa moist.  Cardiovascular: RRR, S1/S2 +, no rubs, no gallops Respiratory: scattered inspiratory rales, no wheezing, no rhonchi Abdominal: Soft, NT, ND, bowel sounds + Extremities: no edema, no cyanosis    The results of significant diagnostics from this hospitalization (including imaging, microbiology, ancillary and laboratory) are listed below for reference.     Microbiology: No results found for this or any previous visit (from the past 240 hour(s)).   Labs: BNP (last 3 results)  Recent Labs  11/15/16 2319 03/24/17 0145  BNP 25.0 35.4   Basic Metabolic Panel:  Recent Labs Lab 03/24/17 0145 03/25/17 0544  NA 138 138  K 3.6 4.3  CL 105 104  CO2 26 26  GLUCOSE 148* 146*  BUN 16 18  CREATININE 1.11 0.88  CALCIUM 8.4* 8.8*   Liver Function Tests: No results for input(s): AST, ALT, ALKPHOS, BILITOT, PROT, ALBUMIN in the last 168 hours. No results for input(s): LIPASE, AMYLASE in the last 168 hours. No results for input(s): AMMONIA in the last 168 hours. CBC:  Recent Labs Lab 03/24/17 0145 03/25/17 0544  WBC 9.8 11.2*  HGB 14.0 13.8  HCT 40.9 39.6  MCV 89.7 87.4  PLT 197 183   Cardiac Enzymes: No  results for input(s): CKTOTAL, CKMB, CKMBINDEX, TROPONINI in the last 168 hours. BNP: Invalid input(s): POCBNP CBG:  Recent Labs Lab 03/25/17 0706 03/25/17 1149 03/25/17 1648 03/25/17 2159 03/26/17 0709  GLUCAP 147* 91 165* 160* 89   D-Dimer No results for input(s): DDIMER in the last 72 hours. Hgb A1c No results for input(s): HGBA1C in the last 72 hours. Lipid Profile No results for input(s): CHOL, HDL, LDLCALC, TRIG, CHOLHDL, LDLDIRECT in the last 72 hours. Thyroid function studies No results for input(s): TSH, T4TOTAL, T3FREE, THYROIDAB in the last 72 hours.  Invalid input(s): FREET3 Anemia work up No results for input(s): VITAMINB12, FOLATE, FERRITIN, TIBC, IRON, RETICCTPCT in the last 72 hours. Urinalysis    Component Value Date/Time    COLORURINE YELLOW (A) 01/15/2017 2108   APPEARANCEUR CLEAR (A) 01/15/2017 2108   APPEARANCEUR [place] 05/15/2012 1605   LABSPEC 1.023 01/15/2017 2108   LABSPEC 1.020 05/15/2012 1605   PHURINE 6.0 01/15/2017 2108   GLUCOSEU 50 (A) 01/15/2017 2108   GLUCOSEU Negative 05/15/2012 1605   GLUCOSEU >=1000 11/03/2009 1019   HGBUR NEGATIVE 01/15/2017 2108   BILIRUBINUR NEGATIVE 01/15/2017 2108   BILIRUBINUR Negative 05/15/2012 [place] 01/15/2017 2108   PROTEINUR NEGATIVE 01/15/2017 2108   UROBILINOGEN 0.2 03/17/2012 0216   NITRITE POSITIVE (A) 01/15/2017 2108   LEUKOCYTESUR NEGATIVE 01/15/2017 2108   LEUKOCYTESUR Negative 05/15/2012 1605   Sepsis Labs Invalid input(s): PROCALCITONIN,  WBC,  LACTICIDVEN Microbiology No results found for this or any previous visit (from the past 240 hour(s)).   Time coordinating discharge: 45 minutes  SIGNED:   Tawni Millers, MD  [facility] 03/26/2017, 10:26 AM Pager   If 7PM-7AM, please contact night-coverage [email or web address] Password [email or web address]

## 2017-07-25 ENCOUNTER — Other Ambulatory Visit: Payer: Self-pay

## 2017-07-25 ENCOUNTER — Encounter: Payer: Self-pay | Admitting: Emergency Medicine

## 2017-07-25 ENCOUNTER — Emergency Department
Admission: EM | Admit: 2017-07-25 | Discharge: 2017-07-26 | Disposition: A | Discharge status: Home / Self Care | Payer: Medicare (Managed Care) | Source: Home / Self Care

## 2017-07-25 ENCOUNTER — Emergency Department: Payer: Medicare (Managed Care)

## 2017-07-25 ENCOUNTER — Emergency Department
Admission: EM | Admit: 2017-07-25 | Discharge: 2017-07-25 | Disposition: A | Discharge status: Home / Self Care | Payer: Medicare (Managed Care) | Attending: Emergency Medicine | Admitting: Emergency Medicine

## 2017-07-25 DIAGNOSIS — I10 Essential (primary) hypertension: Secondary | ICD-10-CM | POA: Diagnosis not present

## 2017-07-25 DIAGNOSIS — J45901 Unspecified asthma with (acute) exacerbation: Secondary | ICD-10-CM | POA: Insufficient documentation

## 2017-07-25 DIAGNOSIS — Z5321 Procedure and treatment not carried out due to patient leaving prior to being seen by health care provider: Secondary | ICD-10-CM

## 2017-07-25 DIAGNOSIS — Z7984 Long term (current) use of oral hypoglycemic drugs: Secondary | ICD-10-CM | POA: Insufficient documentation

## 2017-07-25 DIAGNOSIS — R0602 Shortness of breath: Secondary | ICD-10-CM

## 2017-07-25 DIAGNOSIS — E119 Type 2 diabetes mellitus without complications: Secondary | ICD-10-CM | POA: Diagnosis not present

## 2017-07-25 DIAGNOSIS — F17210 Nicotine dependence, cigarettes, uncomplicated: Secondary | ICD-10-CM | POA: Insufficient documentation

## 2017-07-25 DIAGNOSIS — J441 Chronic obstructive pulmonary disease with (acute) exacerbation: Secondary | ICD-10-CM | POA: Diagnosis not present

## 2017-07-25 DIAGNOSIS — Z79899 Other long term (current) drug therapy: Secondary | ICD-10-CM | POA: Diagnosis not present

## 2017-07-25 LAB — COMPREHENSIVE METABOLIC PANEL
ALT: 20 U/L (ref 17–63)
AST: 24 U/L (ref 15–41)
Albumin: 3.5 g/dL (ref 3.5–5.0)
Alkaline Phosphatase: 84 U/L (ref 38–126)
Anion gap: 8 (ref 5–15)
BUN: 22 mg/dL — AB (ref 6–20)
CHLORIDE: 106 mmol/L (ref 101–111)
CO2: 24 mmol/L (ref 22–32)
CREATININE: 1.26 mg/dL — AB (ref 0.61–1.24)
Calcium: 9 mg/dL (ref 8.9–10.3)
GFR calc Af Amer: 60 mL/min — ABNORMAL LOW (ref >60)
GFR calc non Af Amer: 52 mL/min — ABNORMAL LOW (ref >60)
GLUCOSE: 213 mg/dL — AB (ref 65–99)
Potassium: 3.5 mmol/L (ref 3.5–5.1)
SODIUM: 138 mmol/L (ref 135–145)
Total Bilirubin: 1 mg/dL (ref 0.3–1.2)
Total Protein: 6.5 g/dL (ref 6.5–8.1)

## 2017-07-25 LAB — CBC
HCT: 45.2 % (ref 40.0–52.0)
HEMOGLOBIN: 15.2 g/dL (ref 13.0–18.0)
MCH: 31.2 pg (ref 26.0–34.0)
MCHC: 33.6 g/dL (ref 32.0–36.0)
MCV: 93 fL (ref 80.0–100.0)
PLATELETS: 227 10*3/uL (ref 150–440)
RBC: 4.86 MIL/uL (ref 4.40–5.90)
RDW: 14 % (ref 11.5–14.5)
WBC: 9.3 10*3/uL (ref 3.8–10.6)

## 2017-07-25 LAB — TROPONIN I: Troponin I: <0.03 ng/mL (ref <0.03)

## 2017-07-25 MED ORDER — IPRATROPIUM-ALBUTEROL 0.5-2.5 (3) MG/3ML IN SOLN
3.0000 mL | Freq: Once | RESPIRATORY_TRACT | Status: AC
  Administered 2017-07-25: 3 mL via RESPIRATORY_TRACT
  Filled 2017-07-25: qty 3

## 2017-07-25 MED ORDER — LEVOFLOXACIN 500 MG PO TABS: 500.0000 mg | ORAL_TABLET | Freq: Every day | ORAL | 0 refills | Status: AC

## 2017-07-25 MED ORDER — PREDNISONE 10 MG (21) PO TBPK: ORAL_TABLET | Freq: Every day | ORAL | 0 refills | Status: DC

## 2017-07-25 MED ORDER — METHYLPREDNISOLONE SODIUM SUCC 125 MG IJ SOLR
125.0000 mg | Freq: Once | INTRAMUSCULAR | Status: AC
  Administered 2017-07-25: 125 mg via INTRAVENOUS
  Filled 2017-07-25: qty 2

## 2017-07-25 MED ORDER — IOPAMIDOL (ISOVUE-300) INJECTION 61%
75.0000 mL | Freq: Once | INTRAVENOUS | Status: AC | PRN
  Administered 2017-07-25: 75 mL via INTRAVENOUS

## 2017-07-25 NOTE — ED Triage Notes (Addendum)
Patient ambulatory to triage with steady gait, without difficulty or distress noted; pt reports was d/c today with instructions to use O2 at 2l/min as needed as well as inhalers; st O2 was delivered today but "it wouldn't stay on"; st he called the [employer] rep and "stayed on the phone 30 minutes but the machine kept beeping"; st he called the ED and was told to came back here; pt denies any c/o at present; pt st that he is here visiting his brother in [place] and lives in [place], traveling back tonight; asked pt if they were sending out someone to check his equipment and st "they don't have anyone to come"

## 2017-07-25 NOTE — ED Notes (Signed)
Patient remained at 93% oxygen saturation on room air while at rest. Patient ambulated around room and to nurses station. Patient became slightly tachypnic and complained of feeling dizzy but able to ambulate with standby assistance only. Patient oxygen saturation dropped to 88% on room air but returned to 94% when returned to bed. Patient back in bed and reattached to monitor.  MD made aware.

## 2017-07-25 NOTE — Care Management (Signed)
Portable O2 concentrator delivered. No other RNCM needs.

## 2017-07-25 NOTE — ED Notes (Signed)
Patient taken off of oxygen to obtain room air saturation.

## 2017-07-25 NOTE — ED Notes (Addendum)
Called [employer] to have a rep assist pt with equipment; pt becomes upset during questioning of his information for the rep and st "I just need oxygen ya'll just sitting around asking questions! My doctor told me ya'll didn't know what ya'll was talking about!" attempted to reassure pt his O2 sats are WNL and his lung sounds are clear and that we are trying to assist him in getting the equipment he needs by giving the company required information so that they may access their records but pt continues to be angry and then leaves triage and ED lobby

## 2017-07-25 NOTE — ED Provider Notes (Signed)
Patient will be discharged with steroids as well as.  We have provided oxygen for him to use at home.  He is cleared for discharge.   Earleen Newport, MD 07/25/17 [phone]

## 2017-07-25 NOTE — ED Notes (Signed)
Patient ambulated to restroom with standby assistance. 

## 2017-07-25 NOTE — ED Triage Notes (Signed)
Pt to room 10 via w/c with audible wheezing; reports SHOB and mid CP, nonradiating since yesterday; prod cough clear sputum unrelieved by neb at home; st hx COPD, asthma, and bronchitis, +smoker

## 2017-07-25 NOTE — Care Management (Addendum)
This RNCM was notified by ED MD about patient needing portable Oxygen. Per RNCM history notes patient has nebulizer, supplemental O2 through [employer] (in [place]), and travels between his place in [place] and his brother's [place] home.  I have talked to [place] [phone]) [phone] and they can provide patient with another O2 tank. [place] courier will call this RNCM with ETA and I can update patient/ED staff. I met with patient and he states that his "doctor is trying to get me off of it". He states that he "does not wear it all the time".  I spoke with [employer] and the order per Dr. Lynann Bologna [phone] has prescribed continuous Oxygen at 2L/min.  I have updated Dr. Felecia Shelling office of patient's presentation to [facility] ED and [facility] ED. She will follow up with patient. [employer] however will provide patient with portable O2 concentrator today.

## 2017-07-25 NOTE — ED Notes (Signed)
Dr. Jimmye Norman provided pt with meal tray. Once pt finishes eating pt will be DC.

## 2017-07-25 NOTE — ED Notes (Signed)
Shortness of Breath started yesterday and increasing worse throughout the day and night. Used inhaler at home without relief.

## 2017-07-25 NOTE — ED Provider Notes (Signed)
[facility] Emergency Department Provider Note   First MD Initiated Contact with Patient 07/25/17 [phone]     (approximate)  I have reviewed the triage vital signs and the nursing notes.   HISTORY  Chief Complaint Shortness of Breath    HPI [name] [name] is a 83 y.o. male below list of chronic medical conditions including COPD presents to the emergency department with 1 day history of progressive dyspnea unrelieved with albuterol breathing treatments at home and prednisone which patient is currently taking.  Patient denies any fever.  Patient denies any chest pain.  Patient does admit to continued tobacco use.  Of note patient states that he is prescribed home oxygen but he "ran out 3 DAYS AGO".   Past Medical History:  Diagnosis Date  . ABSCESS 12/03/2009  . ABSCESS, FINGER 04/07/2010  . ANXIETY 11/03/2009  . ASTHMA 11/03/2009  . CHRONIC OBSTRUCTIVE PULMONARY DISEASE, ACUTE EXACERBATION 11/03/2009  . COPD 11/03/2009  . DEPRESSION 11/03/2009  . DIABETES MELLITUS, TYPE II 11/03/2009  . [place] DISEASE, LUMBAR 11/03/2009  . EMPHYSEMA, BULLOUS 11/03/2009  . GERD 11/03/2009  . HYPERLIPIDEMIA 11/03/2009  . HYPERTENSION 11/03/2009  . Kidney stones 01/30/12   "I've had them 7 times; always have passed them"  . PEPTIC ULCER DISEASE 11/03/2009  . Pneumonia   . RASH-NONVESICULAR 11/03/2009  . RESTLESS LEG SYNDROME 11/03/2009  . Shortness of breath    "sometimes; at any time"  . SPINAL STENOSIS, LUMBAR 11/03/2009    Patient Active Problem List   Diagnosis Date Noted  . COPD exacerbation ([place]) 03/24/2017  . Hypoxia 03/24/2017  . Diabetes mellitus type 2 in nonobese ([place]) 03/24/2017  . Polysubstance abuse ([place]) 03/24/2017  . Pressure injury of skin 03/24/2017  . Overdose of benzodiazepine 01/16/2017  . Unresponsiveness   . HCAP (healthcare-associated pneumonia)   . Respiratory failure ([place]) 01/15/2017  . CVA (cerebral vascular accident) ([place]) 12/15/2016  . Chest pain  07/06/202018  . PNA (pneumonia) 03/17/2012  . Weakness generalized 03/17/2012  . Generalized weakness 01/30/2012  . Fall at home 01/30/2012  . Physical deconditioning 01/30/2012  . Nausea vomiting and diarrhea 01/15/2012  . UTI (urinary tract infection) 01/15/2012  . Cocaine abuse ([place]) 08/11/2011  . Tobacco abuse 08/11/2011  . Orthostasis 12/23/2010  . Dehydration 12/23/2010  . Abdominal pain, other specified site 12/23/2010  . Dizziness 12/23/2010  . Weight loss 12/23/2010  . Left lumbar radiculopathy 12/23/2010  . DIABETES MELLITUS, TYPE II 11/03/2009  . HYPERLIPIDEMIA 11/03/2009  . ANXIETY 11/03/2009  . DEPRESSION 11/03/2009  . RESTLESS LEG SYNDROME 11/03/2009  . HYPERTENSION 11/03/2009  . EMPHYSEMA, BULLOUS 11/03/2009  . ASTHMA 11/03/2009  . COPD 11/03/2009  . GERD 11/03/2009  . PEPTIC ULCER DISEASE 11/03/2009  . [place] DISEASE, LUMBAR 11/03/2009  . SPINAL STENOSIS, LUMBAR 11/03/2009  . NEPHROLITHIASIS, HX OF 11/03/2009    Past Surgical History:  Procedure Laterality Date  . [place]   left  . INGUINAL HERNIA REPAIR  10/2011   left  . ROTATOR CUFF REPAIR  2003   left  . TONSILLECTOMY  1960    Prior to Admission medications   Medication Sig Start Date End Date Taking? Authorizing Provider  albuterol (PROVENTIL HFA;VENTOLIN HFA) 108 (90 Base) MCG/ACT inhaler Inhale 1-2 puffs into the lungs every 6 (six) hours as needed for wheezing or shortness of breath.  06/03/13   [provider]  atorvastatin (LIPITOR) 40 MG tablet Take 1 tablet (40 mg total) by mouth daily.  12/16/16   Bettey Costa, MD  Fluticasone-Salmeterol (ADVAIR) 250-50 MCG/DOSE AEPB Inhale 1 puff into the lungs 2 (two) times daily. 12/05/12   [provider]  ipratropium-albuterol (DUONEB) 0.5-2.5 (3) MG/3ML SOLN Take 3 mLs by nebulization every 6 (six) hours as needed (as needed for shortness of breath). 03/26/17   Arrien, Jimmy Picket, MD  lisinopril (PRINIVIL,ZESTRIL) 10 MG  tablet Take 10 mg by mouth daily.    [provider]  metFORMIN (GLUCOPHAGE) 500 MG tablet Take 500 mg by mouth 2 (two) times daily with a meal.    [provider]    Allergies No known drug allergies  Family History  Problem Relation Age of Onset  . Heart disease Father   . Heart disease Mother   . Cancer Brother        lung    Social History Social History   Tobacco Use  . Smoking status: Current Every Day Smoker    Packs/day: 1.00    Years: 41.00    Pack years: 41.00    Types: Cigarettes  . Smokeless tobacco: Never Used  . Tobacco comment: "stopped smoking 04/21/1991 then restarted in 2012"  Substance Use Topics  . Alcohol use: No  . Drug use: No    Review of Systems Constitutional: No fever/chills Eyes: No visual changes. ENT: No sore throat. Cardiovascular: Denies chest pain. Respiratory: Positive for dyspnea and cough Gastrointestinal: No abdominal pain.  No nausea, no vomiting.  No diarrhea.  No constipation. Genitourinary: Negative for dysuria. Musculoskeletal: Negative for neck pain.  Negative for back pain. Integumentary: Negative for rash. Neurological: Negative for headaches, focal weakness or numbness.   ____________________________________________   PHYSICAL EXAM:  VITAL SIGNS: ED Triage Vitals  Enc Vitals Group     BP 07/25/17 0610 129/68     Pulse Rate 07/25/17 0610 97     Resp 07/25/17 0610 (!) 22     Temp 07/25/17 0610 (!) 97.5 F (36.4 C)     Temp Source 07/25/17 0610 Oral     SpO2 07/25/17 0610 93 %     Weight 07/25/17 0617 76.7 kg (169 lb)     Height 07/25/17 0617 1.803 m (5\' 11" )     Head Circumference --      Peak Flow --      Pain Score 07/25/17 0616 2     Pain Loc --      Pain Edu? --      Excl. in [place]? --     Constitutional: Alert and oriented. Well appearing and in no acute distress. Eyes: Conjunctivae are normal. PERRL. EOMI. Head: Atraumatic. Mouth/Throat: Mucous membranes are moist.  Oropharynx  non-erythematous. Neck: No stridor.   Cardiovascular: Normal rate, regular rhythm. Good peripheral circulation. Grossly normal heart sounds. Respiratory: tachypnea.  No retractions.diffuse rhonchi Gastrointestinal: Soft and nontender. No distention.  Musculoskeletal: No lower extremity tenderness nor edema. No gross deformities of extremities. Neurologic:  Normal speech and language. No gross focal neurologic deficits are appreciated.  Skin:  Skin is warm, dry and intact. No rash noted. Psychiatric: Mood and affect are normal. Speech and behavior are normal.  ____________________________________________   LABS (all labs ordered are listed, but only abnormal results are displayed)  Labs Reviewed  CBC  COMPREHENSIVE METABOLIC PANEL  TROPONIN I   ____________________________________________  EKG  Time: 6:10 AM Rate 98 Rhythm: Normal sinus rhythm Axis: Normal Intervals: Normal ST segment: No ST segment elevation or depression. ____________________________________________  RADIOLOGY I, Gregor Hams, personally viewed  and evaluated these images (plain radiographs) as part of my medical decision making, as well as reviewing the written report by the radiologist.  Dg Chest Portable 1 View  Result Date: 07/25/2017 CLINICAL DATA:  Wheezing, shortness of breath, and chest pain. Smoker. EXAM: PORTABLE CHEST 1 VIEW COMPARISON:  03/28/2017 FINDINGS: Heart size is normal. Bullous emphysematous changes most prominent on the left. Volume loss in the right lung. Chronic scarring in the lung bases. No developing airspace disease or consolidation. No pneumothorax. No change in appearance since previous studies. Calcification of the aorta. IMPRESSION: Chronic changes in the lungs with bullous emphysematous change on the left and volume loss on the right. Chronic fibrosis in the lung bases. No change since previous studies. No active consolidation. Electronically Signed   By: Lucienne Capers M.D.    On: 07/25/2017 06:38    ______________ Procedures   ____________________________________________   INITIAL IMPRESSION / ASSESSMENT AND PLAN / ED COURSE  As part of my medical decision making, I reviewed the following data within the electronic MEDICAL RECORD NUMBER 83 year old male presenting with above stated history of dyspnea. Concern for possible COPD exacerbationand a such patient given 2 DuoNeb's and Solu-Medrol 125 mg. Patient's care transferred to Dr. Jimmye Norman ____________________________________________  FINAL CLINICAL IMPRESSION(S) / ED DIAGNOSES  Final diagnoses:  COPD exacerbation ([place])     MEDICATIONS GIVEN DURING THIS VISIT:  Medications  methylPREDNISolone sodium succinate (SOLU-MEDROL) 125 mg/2 mL injection 125 mg (not administered)  ipratropium-albuterol (DUONEB) 0.5-2.5 (3) MG/3ML nebulizer solution 3 mL (3 mLs Nebulization Given 07/25/17 0627)  ipratropium-albuterol (DUONEB) 0.5-2.5 (3) MG/3ML nebulizer solution 3 mL (3 mLs Nebulization Given 07/25/17 1638)       Note:  This document was prepared using Dragon voice recognition software and may include unintentional dictation errors.    Gregor Hams, MD 07/25/17 [phone]

## 2018-05-12 ENCOUNTER — Encounter: Payer: Self-pay | Admitting: Emergency Medicine

## 2018-05-12 ENCOUNTER — Emergency Department: Payer: Medicare (Managed Care)

## 2018-05-12 ENCOUNTER — Other Ambulatory Visit: Payer: Self-pay

## 2018-05-12 ENCOUNTER — Emergency Department
Admission: EM | Admit: 2018-05-12 | Discharge: 2018-05-12 | Disposition: A | Discharge status: Home / Self Care | Payer: Medicare (Managed Care) | Attending: Emergency Medicine | Admitting: Emergency Medicine

## 2018-05-12 DIAGNOSIS — R0600 Dyspnea, unspecified: Secondary | ICD-10-CM | POA: Diagnosis not present

## 2018-05-12 DIAGNOSIS — I10 Essential (primary) hypertension: Secondary | ICD-10-CM | POA: Insufficient documentation

## 2018-05-12 DIAGNOSIS — Z79899 Other long term (current) drug therapy: Secondary | ICD-10-CM | POA: Diagnosis not present

## 2018-05-12 DIAGNOSIS — J441 Chronic obstructive pulmonary disease with (acute) exacerbation: Secondary | ICD-10-CM | POA: Insufficient documentation

## 2018-05-12 DIAGNOSIS — F17210 Nicotine dependence, cigarettes, uncomplicated: Secondary | ICD-10-CM | POA: Diagnosis not present

## 2018-05-12 DIAGNOSIS — Z7982 Long term (current) use of aspirin: Secondary | ICD-10-CM | POA: Insufficient documentation

## 2018-05-12 DIAGNOSIS — R0602 Shortness of breath: Secondary | ICD-10-CM | POA: Diagnosis present

## 2018-05-12 DIAGNOSIS — E119 Type 2 diabetes mellitus without complications: Secondary | ICD-10-CM | POA: Insufficient documentation

## 2018-05-12 DIAGNOSIS — Z7984 Long term (current) use of oral hypoglycemic drugs: Secondary | ICD-10-CM | POA: Diagnosis not present

## 2018-05-12 LAB — CBC
HEMATOCRIT: 37.8 % — AB (ref 40.0–52.0)
HEMOGLOBIN: 12.8 g/dL — AB (ref 13.0–18.0)
MCH: 29.4 pg (ref 26.0–34.0)
MCHC: 33.8 g/dL (ref 32.0–36.0)
MCV: 87 fL (ref 80.0–100.0)
Platelets: 226 10*3/uL (ref 150–440)
RBC: 4.35 MIL/uL — AB (ref 4.40–5.90)
RDW: 17.1 % — AB (ref 11.5–14.5)
WBC: 15.7 10*3/uL — AB (ref 3.8–10.6)

## 2018-05-12 LAB — BASIC METABOLIC PANEL
ANION GAP: 8 (ref 5–15)
BUN: 27 mg/dL — AB (ref 8–23)
CHLORIDE: 107 mmol/L (ref 98–111)
CO2: 27 mmol/L (ref 22–32)
Calcium: 9.1 mg/dL (ref 8.9–10.3)
Creatinine, Ser: 1.03 mg/dL (ref 0.61–1.24)
GFR calc Af Amer: >60 mL/min (ref >60)
GFR calc non Af Amer: >60 mL/min (ref >60)
Glucose, Bld: 158 mg/dL — ABNORMAL HIGH (ref 70–99)
POTASSIUM: 4.6 mmol/L (ref 3.5–5.1)
SODIUM: 142 mmol/L (ref 135–145)

## 2018-05-12 LAB — TROPONIN I: Troponin I: <0.03 ng/mL (ref <0.03)

## 2018-05-12 MED ORDER — TAMSULOSIN HCL 0.4 MG PO CAPS
0.40 | ORAL_CAPSULE | ORAL | Status: DC
Start: 2018-05-11 — End: 2018-05-12

## 2018-05-12 MED ORDER — IPRATROPIUM-ALBUTEROL 0.5-2.5 (3) MG/3ML IN SOLN
3.00 | RESPIRATORY_TRACT | Status: DC
Start: 2018-05-10 — End: 2018-05-12

## 2018-05-12 MED ORDER — ATORVASTATIN CALCIUM 10 MG PO TABS
20.00 | ORAL_TABLET | ORAL | Status: DC
Start: 2018-05-11 — End: 2018-05-12

## 2018-05-12 MED ORDER — ASPIRIN EC 81 MG PO TBEC
81.00 | DELAYED_RELEASE_TABLET | ORAL | Status: DC
Start: 2018-05-11 — End: 2018-05-12

## 2018-05-12 MED ORDER — ACETAMINOPHEN 325 MG PO TABS
650.00 | ORAL_TABLET | ORAL | Status: DC
Start: ? — End: 2018-05-12

## 2018-05-12 MED ORDER — DEXTROSE 50 % IV SOLN
50.00 | INTRAVENOUS | Status: DC
Start: ? — End: 2018-05-12

## 2018-05-12 MED ORDER — INSULIN LISPRO 100 UNIT/ML ~~LOC~~ SOLN
SUBCUTANEOUS | Status: DC
Start: 2018-05-10 — End: 2018-05-12

## 2018-05-12 MED ORDER — METOPROLOL TARTRATE 25 MG PO TABS
12.50 | ORAL_TABLET | ORAL | Status: DC
Start: 2018-05-10 — End: 2018-05-12

## 2018-05-12 MED ORDER — ARFORMOTEROL TARTRATE 15 MCG/2ML IN NEBU
15.00 | INHALATION_SOLUTION | RESPIRATORY_TRACT | Status: DC
Start: 2018-05-10 — End: 2018-05-12

## 2018-05-12 MED ORDER — IPRATROPIUM-ALBUTEROL 0.5-2.5 (3) MG/3ML IN SOLN
3.0000 mL | Freq: Once | RESPIRATORY_TRACT | Status: AC
  Administered 2018-05-12: 3 mL via RESPIRATORY_TRACT
  Filled 2018-05-12: qty 3

## 2018-05-12 MED ORDER — PREDNISONE 10 MG PO TABS: 10.0000 mg | ORAL_TABLET | Freq: Every day | ORAL | 0 refills | Status: DC

## 2018-05-12 MED ORDER — ALBUTEROL SULFATE (2.5 MG/3ML) 0.083% IN NEBU
2.50 | INHALATION_SOLUTION | RESPIRATORY_TRACT | Status: DC
Start: ? — End: 2018-05-12

## 2018-05-12 MED ORDER — MIDODRINE HCL 2.5 MG PO TABS
5.00 | ORAL_TABLET | ORAL | Status: DC
Start: 2018-05-10 — End: 2018-05-12

## 2018-05-12 MED ORDER — PANTOPRAZOLE SODIUM 40 MG PO TBEC
40.00 | DELAYED_RELEASE_TABLET | ORAL | Status: DC
Start: 2018-05-11 — End: 2018-05-12

## 2018-05-12 MED ORDER — ENOXAPARIN SODIUM 40 MG/0.4ML ~~LOC~~ SOLN
40.00 | SUBCUTANEOUS | Status: DC
Start: 2018-05-11 — End: 2018-05-12

## 2018-05-12 MED ORDER — METHYLPREDNISOLONE SODIUM SUCC 125 MG IJ SOLR
125.0000 mg | Freq: Once | INTRAMUSCULAR | Status: AC
  Administered 2018-05-12: 125 mg via INTRAVENOUS
  Filled 2018-05-12: qty 2

## 2018-05-12 MED ORDER — AZITHROMYCIN 250 MG PO TABS
500.00 | ORAL_TABLET | ORAL | Status: DC
Start: 2018-05-11 — End: 2018-05-12

## 2018-05-12 NOTE — ED Notes (Signed)
Patient transported to X-ray 

## 2018-05-12 NOTE — ED Provider Notes (Signed)
[facility] Emergency Department Provider Note  Time seen: 10:14 PM  I have reviewed the triage vital signs and the nursing notes.   HISTORY  Chief Complaint Shortness of Breath and Palpitations    HPI [name] [name] is a 84 y.o. male with a past medical history of anxiety, asthma, COPD, diabetes, hypertension, hyperlipidemia, presents to the emergency department for shortness of breath.  According to the patient he smokes cigarettes when he gets upset and states he was upset this afternoon because of issues he is having with his son and his son's girlfriend.  States since his afternoon he has had worsening shortness of breath and wheeze.  States a history of COPD does not wear oxygen at baseline.  No chest pain.  No fever.  Chronic cough.  States mild swelling in his extremities over the past several days.   Past Medical History:  Diagnosis Date  . ABSCESS 12/03/2009  . ABSCESS, FINGER 04/07/2010  . ANXIETY 11/03/2009  . ASTHMA 11/03/2009  . CHRONIC OBSTRUCTIVE PULMONARY DISEASE, ACUTE EXACERBATION 11/03/2009  . COPD 11/03/2009  . DEPRESSION 11/03/2009  . DIABETES MELLITUS, TYPE II 11/03/2009  . [place] DISEASE, LUMBAR 11/03/2009  . EMPHYSEMA, BULLOUS 11/03/2009  . GERD 11/03/2009  . HYPERLIPIDEMIA 11/03/2009  . HYPERTENSION 11/03/2009  . Kidney stones 01/30/12   "I've had them 7 times; always have passed them"  . PEPTIC ULCER DISEASE 11/03/2009  . Pneumonia   . RASH-NONVESICULAR 11/03/2009  . RESTLESS LEG SYNDROME 11/03/2009  . Shortness of breath    "sometimes; at any time"  . SPINAL STENOSIS, LUMBAR 11/03/2009    Patient Active Problem List   Diagnosis Date Noted  . COPD exacerbation ([place]) 03/24/2017  . Hypoxia 03/24/2017  . Diabetes mellitus type 2 in nonobese ([place]) 03/24/2017  . Polysubstance abuse ([place]) 03/24/2017  . Pressure injury of skin 03/24/2017  . Overdose of benzodiazepine 01/16/2017  . Unresponsiveness   . HCAP (healthcare-associated pneumonia)    . Respiratory failure ([place]) 01/15/2017  . CVA (cerebral vascular accident) ([place]) 12/15/2016  . Chest pain 04-02-202018  . PNA (pneumonia) 03/17/2012  . Weakness generalized 03/17/2012  . Generalized weakness 01/30/2012  . Fall at home 01/30/2012  . Physical deconditioning 01/30/2012  . Nausea vomiting and diarrhea 01/15/2012  . UTI (urinary tract infection) 01/15/2012  . Cocaine abuse ([place]) 08/11/2011  . Tobacco abuse 08/11/2011  . Orthostasis 12/23/2010  . Dehydration 12/23/2010  . Abdominal pain, other specified site 12/23/2010  . Dizziness 12/23/2010  . Weight loss 12/23/2010  . Left lumbar radiculopathy 12/23/2010  . DIABETES MELLITUS, TYPE II 11/03/2009  . HYPERLIPIDEMIA 11/03/2009  . ANXIETY 11/03/2009  . DEPRESSION 11/03/2009  . RESTLESS LEG SYNDROME 11/03/2009  . HYPERTENSION 11/03/2009  . EMPHYSEMA, BULLOUS 11/03/2009  . ASTHMA 11/03/2009  . COPD 11/03/2009  . GERD 11/03/2009  . PEPTIC ULCER DISEASE 11/03/2009  . [place] DISEASE, LUMBAR 11/03/2009  . SPINAL STENOSIS, LUMBAR 11/03/2009  . NEPHROLITHIASIS, HX OF 11/03/2009    Past Surgical History:  Procedure Laterality Date  . [place]   left  . INGUINAL HERNIA REPAIR  10/2011   left  . ROTATOR CUFF REPAIR  2003   left  . TONSILLECTOMY  1960    Prior to Admission medications   Medication Sig Start Date End Date Taking? Authorizing Provider  albuterol (PROVENTIL HFA;VENTOLIN HFA) 108 (90 Base) MCG/ACT inhaler Inhale 1-2 puffs into the lungs every 6 (six) hours as needed for wheezing or shortness of breath.  06/03/13   [provider]  aspirin 81 MG chewable tablet Chew 1 tablet daily by mouth. 12/05/12   [provider]  atorvastatin (LIPITOR) 40 MG tablet Take 1 tablet (40 mg total) by mouth daily. 12/16/16   Bettey Costa, MD  buPROPion (WELLBUTRIN SR) 150 MG 12 hr tablet Take 150 mg 2 (two) times daily by mouth. 05/01/17   [provider]  Fluticasone-Salmeterol (ADVAIR)  250-50 MCG/DOSE AEPB Inhale 1 puff into the lungs 2 (two) times daily. 12/05/12   [provider]  ipratropium-albuterol (DUONEB) 0.5-2.5 (3) MG/3ML SOLN Take 3 mLs by nebulization every 6 (six) hours as needed (as needed for shortness of breath). 03/26/17   Arrien, Jimmy Picket, MD  losartan (COZAAR) 25 MG tablet Take 25 mg daily by mouth.    [provider]  metFORMIN (GLUCOPHAGE) 500 MG tablet Take 500 mg by mouth 2 (two) times daily with a meal.    [provider]  nitroGLYCERIN (NITROSTAT) 0.4 MG SL tablet Place 1 tablet as needed under the tongue. 04/16/17   [provider]  predniSONE (STERAPRED UNI-PAK 21 TAB) 10 MG (21) TBPK tablet Take daily by mouth. Dispense taper pack as directed 07/25/17   Earleen Newport, MD  tamsulosin (FLOMAX) 0.4 MG CAPS capsule Take 0.4 mg daily by mouth.    [provider]    No Known Allergies  Family History  Problem Relation Age of Onset  . Heart disease Father   . Heart disease Mother   . Cancer Brother        lung    Social History Social History   Tobacco Use  . Smoking status: Current Every Day Smoker    Packs/day: 1.00    Years: 41.00    Pack years: 41.00    Types: Cigarettes  . Smokeless tobacco: Never Used  . Tobacco comment: "stopped smoking 04/21/1991 then restarted in 2012"  Substance Use Topics  . Alcohol use: No  . Drug use: No    Review of Systems Constitutional: Negative for fever. Eyes: Negative for visual complaints ENT: Negative for recent illness/congestion Cardiovascular: Negative for chest pain. Respiratory: Positive for shortness of breath.  Occasional cough, dry. Gastrointestinal: Negative for abdominal pain, vomiting  Genitourinary: Negative for urinary compaints Musculoskeletal: Mild swelling in all extremities per patient. Skin: Negative for skin complaints  Neurological: Negative for headache All other ROS  negative  ____________________________________________   PHYSICAL EXAM:  VITAL SIGNS: ED Triage Vitals  Enc Vitals Group     BP 05/12/18 2147 110/63     Pulse Rate 05/12/18 2147 81     Resp 05/12/18 2147 16     Temp 05/12/18 2147 97.6 F (36.4 C)     Temp Source 05/12/18 2147 Oral     SpO2 05/12/18 2147 95 %     Weight 05/12/18 2150 168 lb (76.2 kg)     Height 05/12/18 2150 5\' 11"  (1.803 m)     Head Circumference --      Peak Flow --      Pain Score 05/12/18 2153 0     Pain Loc --      Pain Edu? --      Excl. in [place]? --     Constitutional: Alert and oriented. Well appearing and in no distress. Eyes: Normal exam ENT   Head: Normocephalic and atraumatic.   Mouth/Throat: Mucous membranes are moist. Cardiovascular: Normal rate, regular rhythm. No murmur Respiratory: Normal respiratory effort without tachypnea nor retractions.  Mild expiratory wheezes auscultated bilaterally. Gastrointestinal: Soft and nontender. No distention. Musculoskeletal: Mild pedal/ankle edema equal bilaterally. Neurologic:  Normal speech and language. No gross focal neurologic deficits Skin:  Skin is warm, dry and intact.  Psychiatric: Mood and affect are normal.   ____________________________________________    EKG  EKG reviewed and interpreted by myself shows normal sinus rhythm 83 bpm with a narrow QRS, normal axis, normal intervals, no concerning ST changes.  ____________________________________________    RADIOLOGY  Chest x-ray shows hyperinflation no active disease.  ____________________________________________   INITIAL IMPRESSION / ASSESSMENT AND PLAN / ED COURSE  Pertinent labs & imaging results that were available during my care of the patient were reviewed by me and considered in my medical decision making (see chart for details).  Patient presents to the emergency department for shortness of breath worse this afternoon.  Differential would include COPD exacerbation,  ACS, pneumonia, pneumothorax.  We will check labs including troponin, obtain a chest x-ray, treat with Solu-Medrol and DuoNeb's and continue to closely monitor.  Chest x-ray shows hyperinflation but no active disease.  Patient's labs show a mild leukocytosis otherwise largely normal/baseline.  Negative troponin.  Overall the patient appears well he did have a mild expiratory wheeze, but is improved after breathing treatments.  Patient states he is feeling somewhat better after breathing treatments.  I believe the patient could safely be discharged home with a short course of steroids and continue to use his breathing treatments at home.  I did discuss very strict return precautions which the patient is agreeable.  ____________________________________________   FINAL CLINICAL IMPRESSION(S) / ED DIAGNOSES  Dyspnea COPD exacerbation    Harvest Dark, MD 05/12/18 2253

## 2018-05-12 NOTE — ED Triage Notes (Signed)
C/O sob and palpitations x 1 day.  Patient is visiting area from [place] and states "my heart doesn't pump right".

## 2018-05-20 ENCOUNTER — Inpatient Hospital Stay
Admission: EM | Admit: 2018-05-20 | Discharge: 2018-05-24 | DRG: 192 | Disposition: A | Discharge status: Home / Self Care | Payer: Medicare (Managed Care) | Attending: Family Medicine | Admitting: Family Medicine

## 2018-05-20 ENCOUNTER — Emergency Department: Payer: Medicare (Managed Care)

## 2018-05-20 ENCOUNTER — Encounter: Payer: Self-pay | Admitting: Emergency Medicine

## 2018-05-20 ENCOUNTER — Other Ambulatory Visit: Payer: Self-pay

## 2018-05-20 DIAGNOSIS — Z8249 Family history of ischemic heart disease and other diseases of the circulatory system: Secondary | ICD-10-CM

## 2018-05-20 DIAGNOSIS — Z8711 Personal history of peptic ulcer disease: Secondary | ICD-10-CM

## 2018-05-20 DIAGNOSIS — K219 Gastro-esophageal reflux disease without esophagitis: Secondary | ICD-10-CM | POA: Diagnosis present

## 2018-05-20 DIAGNOSIS — R0602 Shortness of breath: Secondary | ICD-10-CM | POA: Diagnosis present

## 2018-05-20 DIAGNOSIS — Z7951 Long term (current) use of inhaled steroids: Secondary | ICD-10-CM

## 2018-05-20 DIAGNOSIS — J441 Chronic obstructive pulmonary disease with (acute) exacerbation: Principal | ICD-10-CM | POA: Diagnosis present

## 2018-05-20 DIAGNOSIS — I10 Essential (primary) hypertension: Secondary | ICD-10-CM | POA: Diagnosis present

## 2018-05-20 DIAGNOSIS — Z66 Do not resuscitate: Secondary | ICD-10-CM | POA: Diagnosis present

## 2018-05-20 DIAGNOSIS — Z87442 Personal history of urinary calculi: Secondary | ICD-10-CM | POA: Diagnosis not present

## 2018-05-20 DIAGNOSIS — I951 Orthostatic hypotension: Secondary | ICD-10-CM | POA: Diagnosis present

## 2018-05-20 DIAGNOSIS — Y9301 Activity, walking, marching and hiking: Secondary | ICD-10-CM | POA: Diagnosis present

## 2018-05-20 DIAGNOSIS — E785 Hyperlipidemia, unspecified: Secondary | ICD-10-CM | POA: Diagnosis present

## 2018-05-20 DIAGNOSIS — Z85038 Personal history of other malignant neoplasm of large intestine: Secondary | ICD-10-CM

## 2018-05-20 DIAGNOSIS — L304 Erythema intertrigo: Secondary | ICD-10-CM | POA: Diagnosis present

## 2018-05-20 DIAGNOSIS — W1830XA Fall on same level, unspecified, initial encounter: Secondary | ICD-10-CM | POA: Diagnosis present

## 2018-05-20 DIAGNOSIS — Z7982 Long term (current) use of aspirin: Secondary | ICD-10-CM

## 2018-05-20 DIAGNOSIS — G2581 Restless legs syndrome: Secondary | ICD-10-CM | POA: Diagnosis present

## 2018-05-20 DIAGNOSIS — M722 Plantar fascial fibromatosis: Secondary | ICD-10-CM | POA: Diagnosis present

## 2018-05-20 DIAGNOSIS — E119 Type 2 diabetes mellitus without complications: Secondary | ICD-10-CM | POA: Diagnosis present

## 2018-05-20 DIAGNOSIS — Z79899 Other long term (current) drug therapy: Secondary | ICD-10-CM | POA: Diagnosis not present

## 2018-05-20 DIAGNOSIS — R609 Edema, unspecified: Secondary | ICD-10-CM

## 2018-05-20 DIAGNOSIS — Z7984 Long term (current) use of oral hypoglycemic drugs: Secondary | ICD-10-CM

## 2018-05-20 DIAGNOSIS — F17210 Nicotine dependence, cigarettes, uncomplicated: Secondary | ICD-10-CM | POA: Diagnosis present

## 2018-05-20 HISTORY — DX: Malignant neoplasm of colon, unspecified: C18.9

## 2018-05-20 LAB — CBC
HEMATOCRIT: 41.3 % (ref 40.0–52.0)
Hemoglobin: 13.8 g/dL (ref 13.0–18.0)
MCH: 29.2 pg (ref 26.0–34.0)
MCHC: 33.3 g/dL (ref 32.0–36.0)
MCV: 87.7 fL (ref 80.0–100.0)
Platelets: 156 10*3/uL (ref 150–440)
RBC: 4.71 MIL/uL (ref 4.40–5.90)
RDW: 18.1 % — AB (ref 11.5–14.5)
WBC: 12.5 10*3/uL — ABNORMAL HIGH (ref 3.8–10.6)

## 2018-05-20 LAB — BASIC METABOLIC PANEL
ANION GAP: 10 (ref 5–15)
BUN: 18 mg/dL (ref 8–23)
CALCIUM: 8.8 mg/dL — AB (ref 8.9–10.3)
CO2: 25 mmol/L (ref 22–32)
CREATININE: 1.04 mg/dL (ref 0.61–1.24)
Chloride: 104 mmol/L (ref 98–111)
GFR calc non Af Amer: >60 mL/min (ref >60)
Glucose, Bld: 113 mg/dL — ABNORMAL HIGH (ref 70–99)
POTASSIUM: 4 mmol/L (ref 3.5–5.1)
Sodium: 139 mmol/L (ref 135–145)

## 2018-05-20 LAB — TROPONIN I: Troponin I: <0.03 ng/mL (ref <0.03)

## 2018-05-20 MED ORDER — PREDNISONE 20 MG PO TABS
40.0000 mg | ORAL_TABLET | Freq: Once | ORAL | Status: AC
  Administered 2018-05-20: 40 mg via ORAL
  Filled 2018-05-20: qty 2

## 2018-05-20 MED ORDER — PANTOPRAZOLE SODIUM 40 MG PO TBEC
40.0000 mg | DELAYED_RELEASE_TABLET | Freq: Every day | ORAL | Status: DC
  Administered 2018-05-21 – 2018-05-24 (×4): 40 mg via ORAL
  Filled 2018-05-20 (×4): qty 1

## 2018-05-20 MED ORDER — SODIUM CHLORIDE 0.9 % IV BOLUS
500.0000 mL | Freq: Once | INTRAVENOUS | Status: AC
  Administered 2018-05-20: 500 mL via INTRAVENOUS

## 2018-05-20 MED ORDER — PREDNISONE 10 MG (21) PO TBPK: ORAL_TABLET | ORAL | 0 refills | Status: DC

## 2018-05-20 MED ORDER — METHYLPREDNISOLONE SODIUM SUCC 40 MG IJ SOLR
40.0000 mg | Freq: Two times a day (BID) | INTRAMUSCULAR | Status: DC
  Administered 2018-05-21: 05:00:00 40 mg via INTRAVENOUS
  Filled 2018-05-20: qty 1

## 2018-05-20 MED ORDER — IPRATROPIUM-ALBUTEROL 0.5-2.5 (3) MG/3ML IN SOLN
3.0000 mL | Freq: Once | RESPIRATORY_TRACT | Status: AC
  Administered 2018-05-20: 3 mL via RESPIRATORY_TRACT
  Filled 2018-05-20: qty 3

## 2018-05-20 MED ORDER — AZITHROMYCIN 250 MG PO TABS
250.0000 mg | ORAL_TABLET | Freq: Every day | ORAL | Status: AC
  Administered 2018-05-21 – 2018-05-24 (×4): 250 mg via ORAL
  Filled 2018-05-20 (×4): qty 1

## 2018-05-20 MED ORDER — IPRATROPIUM-ALBUTEROL 0.5-2.5 (3) MG/3ML IN SOLN
3.0000 mL | Freq: Once | RESPIRATORY_TRACT | Status: AC
  Administered 2018-05-20: 3 mL via RESPIRATORY_TRACT
  Filled 2018-05-20: qty 6

## 2018-05-20 MED ORDER — ONDANSETRON HCL 4 MG/2ML IJ SOLN: 4.0000 mg | Freq: Four times a day (QID) | INTRAMUSCULAR | Status: DC | PRN

## 2018-05-20 MED ORDER — BUDESONIDE 0.5 MG/2ML IN SUSP
0.5000 mg | Freq: Two times a day (BID) | RESPIRATORY_TRACT | Status: DC
  Administered 2018-05-20 – 2018-05-24 (×9): 0.5 mg via RESPIRATORY_TRACT
  Filled 2018-05-20 (×10): qty 2

## 2018-05-20 MED ORDER — IPRATROPIUM-ALBUTEROL 0.5-2.5 (3) MG/3ML IN SOLN
3.0000 mL | Freq: Four times a day (QID) | RESPIRATORY_TRACT | Status: DC
  Administered 2018-05-20 – 2018-05-22 (×7): 3 mL via RESPIRATORY_TRACT
  Filled 2018-05-20 (×7): qty 3

## 2018-05-20 MED ORDER — ACETAMINOPHEN 650 MG RE SUPP: 650.0000 mg | Freq: Four times a day (QID) | RECTAL | Status: DC | PRN

## 2018-05-20 MED ORDER — NYSTATIN 100000 UNIT/GM EX POWD: Freq: Four times a day (QID) | CUTANEOUS | 0 refills | Status: DC

## 2018-05-20 MED ORDER — ONDANSETRON HCL 4 MG PO TABS: 4.0000 mg | ORAL_TABLET | Freq: Four times a day (QID) | ORAL | Status: DC | PRN

## 2018-05-20 MED ORDER — ATORVASTATIN CALCIUM 20 MG PO TABS
40.0000 mg | ORAL_TABLET | Freq: Every day | ORAL | Status: DC
  Administered 2018-05-20 – 2018-05-23 (×4): 40 mg via ORAL
  Filled 2018-05-20 (×4): qty 2

## 2018-05-20 MED ORDER — NITROGLYCERIN 0.4 MG SL SUBL: 0.4000 mg | SUBLINGUAL_TABLET | SUBLINGUAL | Status: DC | PRN

## 2018-05-20 MED ORDER — AZITHROMYCIN 500 MG PO TABS
500.0000 mg | ORAL_TABLET | Freq: Every day | ORAL | Status: AC
  Administered 2018-05-20: 500 mg via ORAL
  Filled 2018-05-20: qty 1

## 2018-05-20 MED ORDER — PNEUMOCOCCAL VAC POLYVALENT 25 MCG/0.5ML IJ INJ
0.5000 mL | INJECTION | INTRAMUSCULAR | Status: DC
  Filled 2018-05-20: qty 0.5

## 2018-05-20 MED ORDER — ASPIRIN 81 MG PO CHEW
81.0000 mg | CHEWABLE_TABLET | Freq: Every day | ORAL | Status: DC
  Administered 2018-05-21 – 2018-05-24 (×4): 81 mg via ORAL
  Filled 2018-05-20 (×4): qty 1

## 2018-05-20 MED ORDER — ALBUTEROL SULFATE HFA 108 (90 BASE) MCG/ACT IN AERS: 2.0000 | INHALATION_SPRAY | Freq: Four times a day (QID) | RESPIRATORY_TRACT | 0 refills | Status: DC | PRN

## 2018-05-20 MED ORDER — METHYLPREDNISOLONE SODIUM SUCC 125 MG IJ SOLR
125.0000 mg | Freq: Once | INTRAMUSCULAR | Status: AC
  Administered 2018-05-20: 125 mg via INTRAVENOUS
  Filled 2018-05-20: qty 2

## 2018-05-20 MED ORDER — METFORMIN HCL 500 MG PO TABS
500.0000 mg | ORAL_TABLET | Freq: Two times a day (BID) | ORAL | Status: DC
  Administered 2018-05-20 – 2018-05-24 (×8): 500 mg via ORAL
  Filled 2018-05-20 (×9): qty 1

## 2018-05-20 MED ORDER — ENOXAPARIN SODIUM 40 MG/0.4ML ~~LOC~~ SOLN
40.0000 mg | SUBCUTANEOUS | Status: DC
  Administered 2018-05-20 – 2018-05-22 (×3): 40 mg via SUBCUTANEOUS
  Filled 2018-05-20 (×4): qty 0.4

## 2018-05-20 MED ORDER — GUAIFENESIN-DM 100-10 MG/5ML PO SYRP
5.0000 mL | ORAL_SOLUTION | ORAL | Status: DC | PRN
  Administered 2018-05-20 – 2018-05-23 (×5): 5 mL via ORAL
  Filled 2018-05-20 (×7): qty 5

## 2018-05-20 MED ORDER — IPRATROPIUM-ALBUTEROL 0.5-2.5 (3) MG/3ML IN SOLN
3.0000 mL | Freq: Once | RESPIRATORY_TRACT | Status: AC
  Administered 2018-05-20: 3 mL via RESPIRATORY_TRACT

## 2018-05-20 MED ORDER — MIDODRINE HCL 5 MG PO TABS
5.0000 mg | ORAL_TABLET | Freq: Three times a day (TID) | ORAL | Status: DC
  Administered 2018-05-21 – 2018-05-24 (×10): 5 mg via ORAL
  Filled 2018-05-20 (×14): qty 1

## 2018-05-20 MED ORDER — TAMSULOSIN HCL 0.4 MG PO CAPS
0.4000 mg | ORAL_CAPSULE | Freq: Every day | ORAL | Status: DC
  Administered 2018-05-20 – 2018-05-23 (×4): 0.4 mg via ORAL
  Filled 2018-05-20 (×4): qty 1

## 2018-05-20 MED ORDER — ACETAMINOPHEN 325 MG PO TABS: 650.0000 mg | ORAL_TABLET | Freq: Four times a day (QID) | ORAL | Status: DC | PRN

## 2018-05-20 MED ORDER — SODIUM CHLORIDE 0.9 % IV BOLUS: 1000.0000 mL | Freq: Once | INTRAVENOUS | Status: DC

## 2018-05-20 NOTE — Clinical Social Work Note (Signed)
Clinical Social Work Assessment  Patient Details  Name: [name] [name] MRN: [id number] Date of Birth: Mar 05, 1934  Date of referral:  05/20/18               Reason for consult:  Housing Concerns/Homelessness                Permission sought to share information with:  Family Supports Permission granted to share information::  Yes, Verbal Permission Granted  Name::     All family memebers with the exception of [name] [name] ( no contact) [name] [name] [phone]  Agency::     Relationship::   sons  Contact Information:     Housing/Transportation Living arrangements for the past 2 months:  Mobile Home([place] [employer]) Source of Information:  Patient Patient Interpreter Needed:  None Criminal Activity/Legal Involvement Pertinent to Current Situation/Hospitalization:  No - Comment as needed Significant Relationships:  Adult Children, Other Family Members Lives with:  Self Do you feel safe going back to the place where you live? Yes- will find housing Need for family participation in patient care:  No (Coment)  Care giving concerns:  None   Facilities manager / plan:  LCSW introduced myself to patient and was able collect information. Patient was initially guarded but then was pleasant and cooperative.I completed assessment and mini competency scale from [facility], patient answered questions correctly with a positive outcome for no current issues. The patients memory is intact both long and short term. Patient stated he came down to visit son and has a plan to relocate. He came to ED because he was short of breath. He is agreeable to cab it to the rest stop where he left his car and will find a hotel then suitable housing. LCSW assessed patient for competency and completed full assessment. He has come to [place] to meet with his son and has plans to find apartment or rent room in boarding house. LCSW has provided patient with [place], [place]. Patient had a good  visit with his son [name] [name] last Thursday for a couple of hours and understands it will take time to rebuild his relationship with his son. He reports he has had issues with all kids after his wife passed but does have good relationship with [name] [name] ( son in [place]) but he is working on getting re-aquainted with his other son [name] [name]. He is not suicidal and has no thoughts of self harm or harm to others  LCSW found patient to be competent and independent and has no abuse issues regarding his son and was very upset to hear that APS was called by the nurse. LCSW explained that if he sleeps over in rest areas and not in proper housing others may feel he is in distress. He agrees to find a hotel. He received SSI totaling 1200.00 which is more than enough for housing and gas and food needs as per patient.  LCSW consulted with Gwyndolyn Saxon ED charge nurse/EDP and ED RN and explained APS- situation and that this worker will call APS and explain patients situation. LCSW called and spoke to [facility] for APS DSS and explained situation and she agreed this is not a protection issue or Elder abuse issue. They have cancelled APS report. Hospital will provide patient with cab voucher to [facility] rest stop [place].  Patient will accept resources provided and a boxed lunch and 4 pops.  Enis Slipper LCSW [phone]   Employment status:  Retired([employer] 1200.00) Insurance information:  Medicare  PT Recommendations:  No Follow Up Information / Referral to community resources:  APS (Comment Required: [place], Name & Number of worker spoken with)(Called APS and cancelled report that was called in by nurse: No elder abuse concerns or protection concerns)  Patient/Family's Response to care:  na  Patient/Family's Understanding of and Emotional Response to Diagnosis, Current Treatment, and Prognosis:  Good understanding and he does have medications                                             Emotional  Assessment Appearance:  Appears stated age Attitude/Demeanor/Rapport:  Complaining, Self-Confident Affect (typically observed):  Accepting, Adaptable Orientation:  Oriented to Self, Oriented to Situation, Oriented to Place, Oriented to  Time Alcohol / Substance use:  Not Applicable Psych involvement (Current and /or in the community):  No (Comment)  Discharge Needs  Concerns to be addressed:  Denies Needs/Concerns at this time Readmission within the last 30 days:    Current discharge risk:  None Barriers to Discharge:  No Barriers Identified   Joana Reamer, LCSW 05/20/2018, 9:04 AM

## 2018-05-20 NOTE — ED Notes (Signed)
Pt resting in bed, eyes closed, even, unlabored respirations, VSS.

## 2018-05-20 NOTE — ED Provider Notes (Signed)
[facility] Emergency Department Provider Note   ____________________________________________   First MD Initiated Contact with Patient 05/20/18 0128     (approximate)  I have reviewed the triage vital signs and the nursing notes.   HISTORY  Chief Complaint Shortness of Breath    HPI [name] [name] is a 84 y.o. male with a history of COPD who comes into the hospital today with some shortness of breath.  The patient states that he is visiting his son from [place] and was here in the emergency department last week.  He reports that he was feeling short of breath all day.  He fell going up the hill but did not hit his head.  He was wheezing bilaterally according to EMS and they gave him 1 DuoNeb and one albuterol treatment.  The patient states that he tried using his inhaler at home but it did not help.  The patient denies any chest pain.  He has had a nonproductive cough and also denies fever.   Past Medical History:  Diagnosis Date  . ABSCESS 12/03/2009  . ABSCESS, FINGER 04/07/2010  . ANXIETY 11/03/2009  . ASTHMA 11/03/2009  . CHRONIC OBSTRUCTIVE PULMONARY DISEASE, ACUTE EXACERBATION 11/03/2009  . COPD 11/03/2009  . DEPRESSION 11/03/2009  . DIABETES MELLITUS, TYPE II 11/03/2009  . [place] DISEASE, LUMBAR 11/03/2009  . EMPHYSEMA, BULLOUS 11/03/2009  . GERD 11/03/2009  . HYPERLIPIDEMIA 11/03/2009  . HYPERTENSION 11/03/2009  . Kidney stones 01/30/12   "I've had them 7 times; always have passed them"  . PEPTIC ULCER DISEASE 11/03/2009  . Pneumonia   . RASH-NONVESICULAR 11/03/2009  . RESTLESS LEG SYNDROME 11/03/2009  . Shortness of breath    "sometimes; at any time"  . SPINAL STENOSIS, LUMBAR 11/03/2009    Patient Active Problem List   Diagnosis Date Noted  . COPD exacerbation ([place]) 03/24/2017  . Hypoxia 03/24/2017  . Diabetes mellitus type 2 in nonobese ([place]) 03/24/2017  . Polysubstance abuse ([place]) 03/24/2017  . Pressure injury of skin 03/24/2017  . Overdose of  benzodiazepine 01/16/2017  . Unresponsiveness   . HCAP (healthcare-associated pneumonia)   . Respiratory failure ([place]) 01/15/2017  . CVA (cerebral vascular accident) ([place]) 12/15/2016  . Chest pain 08-Sep-202018  . PNA (pneumonia) 03/17/2012  . Weakness generalized 03/17/2012  . Generalized weakness 01/30/2012  . Fall at home 01/30/2012  . Physical deconditioning 01/30/2012  . Nausea vomiting and diarrhea 01/15/2012  . UTI (urinary tract infection) 01/15/2012  . Cocaine abuse ([place]) 08/11/2011  . Tobacco abuse 08/11/2011  . Orthostasis 12/23/2010  . Dehydration 12/23/2010  . Abdominal pain, other specified site 12/23/2010  . Dizziness 12/23/2010  . Weight loss 12/23/2010  . Left lumbar radiculopathy 12/23/2010  . DIABETES MELLITUS, TYPE II 11/03/2009  . HYPERLIPIDEMIA 11/03/2009  . ANXIETY 11/03/2009  . DEPRESSION 11/03/2009  . RESTLESS LEG SYNDROME 11/03/2009  . HYPERTENSION 11/03/2009  . EMPHYSEMA, BULLOUS 11/03/2009  . ASTHMA 11/03/2009  . COPD 11/03/2009  . GERD 11/03/2009  . PEPTIC ULCER DISEASE 11/03/2009  . [place] DISEASE, LUMBAR 11/03/2009  . SPINAL STENOSIS, LUMBAR 11/03/2009  . NEPHROLITHIASIS, HX OF 11/03/2009    Past Surgical History:  Procedure Laterality Date  . [place]   left  . INGUINAL HERNIA REPAIR  10/2011   left  . ROTATOR CUFF REPAIR  2003   left  . TONSILLECTOMY  1960    Prior to Admission medications   Medication Sig Start Date End Date Taking? Authorizing Provider  albuterol (PROVENTIL  HFA;VENTOLIN HFA) 108 (90 Base) MCG/ACT inhaler Inhale 1-2 puffs into the lungs every 6 (six) hours as needed for wheezing or shortness of breath.  06/03/13  Yes [provider]  aspirin 81 MG chewable tablet Chew 1 tablet daily by mouth. 12/05/12  Yes [provider]  atorvastatin (LIPITOR) 40 MG tablet Take 1 tablet (40 mg total) by mouth daily. 12/16/16  Yes Mody, Ulice Bold, MD  buPROPion (WELLBUTRIN SR) 150 MG 12 hr tablet Take 150  mg 2 (two) times daily by mouth. 05/01/17  Yes [provider]  Fluticasone-Salmeterol (ADVAIR) 250-50 MCG/DOSE AEPB Inhale 1 puff into the lungs 2 (two) times daily. 12/05/12  Yes [provider]  ipratropium-albuterol (DUONEB) 0.5-2.5 (3) MG/3ML SOLN Take 3 mLs by nebulization every 6 (six) hours as needed (as needed for shortness of breath). 03/26/17  Yes Arrien, Jimmy Picket, MD  metFORMIN (GLUCOPHAGE) 500 MG tablet Take 500 mg by mouth 2 (two) times daily with a meal.   Yes [provider]  midodrine (PROAMATINE) 5 MG tablet TAKE 1 TABLET BY MOUTH THREE TIMES A DAY BEFORE MEALS FOR 30 DAYS 05/11/18  Yes [provider]  pantoprazole (PROTONIX) 40 MG tablet TAKE 1 TABLET BY MOUTH EVERY DAY IN THE MORNING BEFORE BREAKFAST FOR 30 DAYS 05/11/18  Yes [provider]  predniSONE (DELTASONE) 10 MG tablet Take 1 tablet (10 mg total) by mouth daily. Day 1-3: take 4 tablets PO daily Day 4-6: take 3 tablets PO daily Day 7-9: take 2 tablets PO daily Day 10-12: take 1 tablet PO daily 05/12/18  Yes Paduchowski, Lennette Bihari, MD  tamsulosin (FLOMAX) 0.4 MG CAPS capsule Take 0.4 mg daily by mouth.   Yes [provider]  albuterol (PROVENTIL HFA;VENTOLIN HFA) 108 (90 Base) MCG/ACT inhaler Inhale 2 puffs into the lungs every 6 (six) hours as needed. 05/20/18   Loney Hering, MD  losartan (COZAAR) 25 MG tablet Take 25 mg daily by mouth.    [provider]  nitroGLYCERIN (NITROSTAT) 0.4 MG SL tablet Place 1 tablet as needed under the tongue. 04/16/17   [provider]  nystatin (MYCOSTATIN/NYSTOP) powder Apply topically 4 (four) times daily. 05/20/18   Loney Hering, MD  predniSONE (STERAPRED UNI-PAK 21 TAB) 10 MG (21) TBPK tablet Take 6 tabs on day 1 Take 5 tabs on day 2 Take 4 tabs on day 3 Take 3 tabs on day 4 Take 2 tabs on day 5 Take 1 tab on day 6 05/20/18   Loney Hering, MD    Allergies Patient has no known allergies.  Family  History  Problem Relation Age of Onset  . Heart disease Father   . Heart disease Mother   . Cancer Brother        lung    Social History Social History   Tobacco Use  . Smoking status: Current Every Day Smoker    Packs/day: 1.00    Years: 41.00    Pack years: 41.00    Types: Cigarettes  . Smokeless tobacco: Never Used  . Tobacco comment: "stopped smoking 04/21/1991 then restarted in 2012"  Substance Use Topics  . Alcohol use: No  . Drug use: No    Review of Systems  Constitutional: No fever/chills Eyes: No visual changes. ENT: No sore throat. Cardiovascular: Denies chest pain. Respiratory: Cough and shortness of breath. Gastrointestinal: No abdominal pain.  No nausea, no vomiting.  Genitourinary: Negative for dysuria. Musculoskeletal: Negative for back pain. Skin: Negative for rash. Neurological: Negative  for headaches,    ____________________________________________   PHYSICAL EXAM:  VITAL SIGNS: ED Triage Vitals  Enc Vitals Group     BP 05/20/18 0135 (!) 102/48     Pulse Rate 05/20/18 0135 80     Resp 05/20/18 0135 18     Temp 05/20/18 0135 97.7 F (36.5 C)     Temp Source 05/20/18 0135 Oral     SpO2 05/20/18 0135 95 %     Weight 05/20/18 0140 167 lb 15.9 oz (76.2 kg)     Height 05/20/18 0140 5\' 11"  (1.803 m)     Head Circumference --      Peak Flow --      Pain Score 05/20/18 0139 0     Pain Loc --      Pain Edu? --      Excl. in [place]? --     Constitutional: Alert and oriented. Well appearing and in moderate distress. Eyes: Conjunctivae are normal. PERRL. EOMI. Head: Atraumatic. Nose: No congestion/rhinnorhea. Mouth/Throat: Mucous membranes are moist.  Oropharynx non-erythematous. Neck: No cervical spine tenderness to palpation. Cardiovascular: Normal rate, regular rhythm. Grossly normal heart sounds.  Good peripheral circulation. Respiratory: Increased respiratory effort.  No retractions.  Expiratory wheezes in bilateral bases. Gastrointestinal:  Soft and nontender. No distention.  Positive bowel sounds Musculoskeletal: Trace pitting edema bilaterally Neurologic:  Normal speech and language.  Skin:  Skin is warm, dry and intact. Psychiatric: Mood and affect are normal.   ____________________________________________   LABS (all labs ordered are listed, but only abnormal results are displayed)  Labs Reviewed  CBC - Abnormal; Notable for the following components:      Result Value   WBC 12.5 (*)    RDW 18.1 (*)    All other components within normal limits  BASIC METABOLIC PANEL - Abnormal; Notable for the following components:   Glucose, Bld 113 (*)    Calcium 8.8 (*)    All other components within normal limits  TROPONIN I   ____________________________________________  EKG  ED ECG REPORT I, Loney Hering, the attending physician, personally viewed and interpreted this ECG.   Date: 05/20/2018  EKG Time: 136  Rate: 78  Rhythm: normal sinus rhythm  Axis: normal  Intervals:none  ST&T Change: none  ____________________________________________  RADIOLOGY  ED MD interpretation:  CXR: COPD with similar left bullous changes and bibasilar fibrosis, stable cardiomegaly, aortic atherosclerosis  Official radiology report(s): Dg Chest 2 View  Result Date: 05/20/2018 CLINICAL DATA:  Shortness of breath and chest tightness since yesterday. EXAM: CHEST - 2 VIEW COMPARISON:  Chest radiograph May 12, 2018 FINDINGS: Stable cardiomegaly. Tortuous calcified aorta. Severe LEFT bullous changes with chronic interstitial changes confluent in lung bases. Similar hyperinflation with flattened hemidiaphragms. No pleural effusion or focal consolidation. No pneumothorax. Osteopenia. IMPRESSION: 1. COPD with similar LEFT bullous changes and bibasilar fibrosis. 2. Stable cardiomegaly. 3.  Aortic Atherosclerosis (ICD10-I70.0). Electronically Signed   By: Elon Alas M.D.   On: 05/20/2018 02:43     ____________________________________________   PROCEDURES  Procedure(s) performed: None  Procedures  Critical Care performed: No  ____________________________________________   INITIAL IMPRESSION / ASSESSMENT AND PLAN / ED COURSE  As part of my medical decision making, I reviewed the following data within the electronic MEDICAL RECORD NUMBER Notes from prior ED visits and [place] Controlled Substance Database   This is an 84 year old male who comes into the hospital today with some shortness of breath.  The patient was seen here last week  and also had some swelling to his legs.  The patient does have some wheezing so I did give him another DuoNeb treatment.  The patient also received a dose of Solu-Medrol.  We will check some blood work on the patient to include a CBC, BMP and a troponin.  The patient's white blood cell count is 12.5 but otherwise his blood work is unremarkable.  The patient's chest x-ray showed some COPD and similar changes to previous.  The nurse states that while speaking with the patient she discovered that the patient is homeless and he is not living with his son.  He is having some difficulty caring for himself and may have some mild dementia.  His breathing was improved and he was feeling better after the evaluation.  He did have some redness and likely fungus to his toes but the patient does need placement.  We will have social work evaluate the patient to determine his appropriate placement and disposition.      ____________________________________________   FINAL CLINICAL IMPRESSION(S) / ED DIAGNOSES  Final diagnoses:  COPD exacerbation ([place])  Shortness of breath  Intertrigo     ED Discharge Orders         Ordered    predniSONE (STERAPRED UNI-PAK 21 TAB) 10 MG (21) TBPK tablet     05/20/18 0645    nystatin (MYCOSTATIN/NYSTOP) powder  4 times daily     05/20/18 0645    albuterol (PROVENTIL HFA;VENTOLIN HFA) 108 (90 Base) MCG/ACT inhaler  Every 6 hours  PRN     05/20/18 0645           Note:  This document was prepared using Dragon voice recognition software and may include unintentional dictation errors.    Loney Hering, MD 05/20/18 [phone]

## 2018-05-20 NOTE — ED Notes (Signed)
This RN spoke with Mickel Fuchs from [place] who will return my call if she supervisor thinks pt needs further involvement with DSS.

## 2018-05-20 NOTE — Progress Notes (Signed)
LCSW assessed patient for competency and completed full assessment. He has come to [place] to meet with his son and has plans to find apartment or rent room in boarding house. LCSW has provided patient with [place], [place]. Patient had a good visit with his son Jerrye Beavers last Thursday for a couple of hours and understands it will take time to rebuild his relationship with his son.   LCSW found patient to be competent and independent and has no abuse issues regarding his son and was very upset to hear that APS was called by the nurse. LCSW explained that if he sleeps over in rest areas and not in proper housing others may feel he is in distress. He agrees to find a hotel. He received SSI totaling 1200.00 which is more than enough for housing and gas and food needs as per patient.  LCSW consulted with Gwyndolyn Saxon ED charge nurse/EDP and ED RN and explained APS- situation and that this worker will call APS and explain patients situation. LCSW called and spoke to [facility] for APS DSS and explained situation and she agreed this is not a protection issue or Elder abuse issue. They have cancelled APS report.  Patient will accept resources provided and a boxed lunch and 4 pops.  [employer] LCSW [phone]

## 2018-05-20 NOTE — ED Provider Notes (Signed)
Have reevaluated the patient several times this morning, given nebulizers and steroid earlier to continue his therapy and was observing him as social work consult was also performed.  On reassessment, went and spoke with the patient he sitting up on the edge of the bed continuing to wheeze with mildly increased work of breathing.  Reports that he still feels short of breath still wheezing and is not feel comfortable that he be able to go home safely.  At this point his vital signs are normal, he is not hypoxic but he does have mild use of accessory muscles and does demonstrate ongoing end expiratory wheezing in all fields.  Will give additional nebulizer treatment, admit for further observation given the ongoing nature of his exacerbation.  Patient agreeable.   Delman Kitten, MD 05/20/18 1215

## 2018-05-20 NOTE — Progress Notes (Signed)
[name], male   DOB: 06/14/1934, 84 y.o.   MRN: [id number]  ACP note  Patient present  Diagnosis: COPD exacerbation, history of orthostatic hypotension, type 2 diabetes mellitus, history of colon cancer, tobacco abuse  CODE STATUS discussed and patient wishes to be a DNR  Plan.  COPD exacerbation treatment with Solu-Medrol and nebulizer treatments including budesonide and DuoNeb.  Time spent on ACP discussion 17 minutes Dr. Loletha Grayer

## 2018-05-20 NOTE — H&P (Signed)
[facility] [place] NAME: [name] [name]    MR#:  [id number]  DATE OF BIRTH:  [birth year]  DATE OF ADMISSION:  05/20/2018  PRIMARY CARE PHYSICIAN: Dr Dionne Bucy  REQUESTING/REFERRING PHYSICIAN: Dr Delman Kitten  CHIEF COMPLAINT:   Chief Complaint  Patient presents with  . Shortness of Breath    HISTORY OF PRESENT ILLNESS:  [name] [name]  is a 84 y.o. male with a known history of COPD presents with shortness of breath going on since yesterday.  He stated it worsened yesterday afternoon and last night.  He was eating at a [employer] and became short of breath.  He was walking and then had a fall in the grass.  Somebody helped him up and got him to his car and he called 911 for further evaluation.  In the ER they were initially watching him to go home the patient still had persistent shortness of breath and hospitalist services were contacted for further evaluation.  PAST MEDICAL HISTORY:   Past Medical History:  Diagnosis Date  . ABSCESS 12/03/2009  . ABSCESS, FINGER 04/07/2010  . ANXIETY 11/03/2009  . ASTHMA 11/03/2009  . CHRONIC OBSTRUCTIVE PULMONARY DISEASE, ACUTE EXACERBATION 11/03/2009  . Colon cancer ([place])   . COPD 11/03/2009  . DEPRESSION 11/03/2009  . DIABETES MELLITUS, TYPE II 11/03/2009  . [place] DISEASE, LUMBAR 11/03/2009  . EMPHYSEMA, BULLOUS 11/03/2009  . GERD 11/03/2009  . HYPERLIPIDEMIA 11/03/2009  . HYPERTENSION 11/03/2009  . Kidney stones 01/30/12   "I've had them 7 times; always have passed them"  . PEPTIC ULCER DISEASE 11/03/2009  . Pneumonia   . RASH-NONVESICULAR 11/03/2009  . RESTLESS LEG SYNDROME 11/03/2009  . Shortness of breath    "sometimes; at any time"  . SPINAL STENOSIS, LUMBAR 11/03/2009    PAST SURGICAL HISTORY:   Past Surgical History:  Procedure Laterality Date  . Colon cancer surgery    . [place]   left  . INGUINAL HERNIA REPAIR  10/2011   left  . ROTATOR CUFF REPAIR  2003   left  .  TONSILLECTOMY  1960    SOCIAL HISTORY:   Social History   Tobacco Use  . Smoking status: Current Every Day Smoker    Packs/day: 1.00    Years: 41.00    Pack years: 41.00    Types: Cigarettes  . Smokeless tobacco: Never Used  . Tobacco comment: "stopped smoking 04/21/1991 then restarted in 2012"  Substance Use Topics  . Alcohol use: No    FAMILY HISTORY:   Family History  Problem Relation Age of Onset  . Heart disease Father   . Heart disease Mother   . Cancer Brother        lung    DRUG ALLERGIES:  No Known Allergies  REVIEW OF SYSTEMS:  CONSTITUTIONAL: No fever, chills or sweats.  Positive for weakness.  Weight loss of 50 pounds since colon cancer diagnosis. EYES: No blurred or double vision.  Wears glasses. EARS, NOSE, AND THROAT: No tinnitus or ear pain. No sore throat. Positive runny nose. RESPIRATORY: Positive for dry cough.  Positive for shortness of breath and wheezing.  No hemoptysis.  CARDIOVASCULAR: No chest pain, orthopnea, edema.  GASTROINTESTINAL: Positive for nausea.  No vomiting, diarrhea or abdominal pain. No blood in bowel movements GENITOURINARY: No dysuria, hematuria.  ENDOCRINE: No polyuria, nocturia,  HEMATOLOGY: No anemia, easy bruising or bleeding SKIN: No rash or lesion. MUSCULOSKELETAL: Positive for joint pain NEUROLOGIC: No  tingling, numbness, weakness.  PSYCHIATRY: History of depression.   MEDICATIONS AT HOME:   Prior to Admission medications   Medication Sig Start Date End Date Taking? Authorizing Provider  albuterol (PROVENTIL HFA;VENTOLIN HFA) 108 (90 Base) MCG/ACT inhaler Inhale 1-2 puffs into the lungs every 6 (six) hours as needed for wheezing or shortness of breath.  06/03/13  Yes [provider]  aspirin 81 MG chewable tablet Chew 1 tablet daily by mouth. 12/05/12  Yes [provider]  atorvastatin (LIPITOR) 40 MG tablet Take 1 tablet (40 mg total) by mouth daily. 12/16/16  Yes Mody, Ulice Bold, MD   Fluticasone-Salmeterol (ADVAIR) 250-50 MCG/DOSE AEPB Inhale 1 puff into the lungs 2 (two) times daily. 12/05/12  Yes [provider]  ipratropium-albuterol (DUONEB) 0.5-2.5 (3) MG/3ML SOLN Take 3 mLs by nebulization every 6 (six) hours as needed (as needed for shortness of breath). 03/26/17  Yes Arrien, Jimmy Picket, MD  metFORMIN (GLUCOPHAGE) 500 MG tablet Take 500 mg by mouth 2 (two) times daily with a meal.   Yes [provider]  midodrine (PROAMATINE) 5 MG tablet TAKE 1 TABLET BY MOUTH THREE TIMES A DAY BEFORE MEALS FOR 30 DAYS 05/11/18  Yes [provider]  pantoprazole (PROTONIX) 40 MG tablet TAKE 1 TABLET BY MOUTH EVERY DAY IN THE MORNING BEFORE BREAKFAST FOR 30 DAYS 05/11/18  Yes [provider]  predniSONE (DELTASONE) 10 MG tablet Take 1 tablet (10 mg total) by mouth daily. Day 1-3: take 4 tablets PO daily Day 4-6: take 3 tablets PO daily Day 7-9: take 2 tablets PO daily Day 10-12: take 1 tablet PO daily 05/12/18  Yes Paduchowski, Lennette Bihari, MD  tamsulosin (FLOMAX) 0.4 MG CAPS capsule Take 0.4 mg daily by mouth.   Yes [provider]  albuterol (PROVENTIL HFA;VENTOLIN HFA) 108 (90 Base) MCG/ACT inhaler Inhale 2 puffs into the lungs every 6 (six) hours as needed. 05/20/18   Loney Hering, MD  nitroGLYCERIN (NITROSTAT) 0.4 MG SL tablet Place 1 tablet as needed under the tongue. 04/16/17   [provider]  nystatin (MYCOSTATIN/NYSTOP) powder Apply topically 4 (four) times daily. 05/20/18   Loney Hering, MD  predniSONE (STERAPRED UNI-PAK 21 TAB) 10 MG (21) TBPK tablet Take 6 tabs on day 1 Take 5 tabs on day 2 Take 4 tabs on day 3 Take 3 tabs on day 4 Take 2 tabs on day 5 Take 1 tab on day 6 05/20/18   Loney Hering, MD      VITAL SIGNS:  Blood pressure 116/61, pulse 87, temperature 97.7 F (36.5 C), temperature source Oral, resp. rate 18, height 5\' 11"  (1.803 m), weight 76.2 kg, SpO2 94 %.  PHYSICAL EXAMINATION:  GENERAL:  84  y.o.-year-old patient lying in the bed with no acute distress.  EYES: Pupils equal, round, reactive to light and accommodation. No scleral icterus. Extraocular muscles intact.  HEENT: Head atraumatic, normocephalic. Oropharynx and nasopharynx clear.  NECK:  Supple, no jugular venous distention. No thyroid enlargement, no tenderness.  LUNGS: Decreased breath sounds bilateral, positive wheezing throughout entire lung field, mostly expiratory.  No rales,rhonchi or crepitation. No use of accessory muscles of respiration.  CARDIOVASCULAR: S1, S2 normal. No murmurs, rubs, or gallops.  ABDOMEN: Soft, nontender, nondistended. Bowel sounds present. No organomegaly or mass.  EXTREMITIES: No pedal edema, cyanosis, or clubbing.  NEUROLOGIC: Cranial nerves II through XII are intact. Muscle strength 5/5 in all extremities. Sensation intact. Gait not checked.  PSYCHIATRIC: The patient is alert and oriented x  3.  SKIN: No rash, lesion, or ulcer.   LABORATORY PANEL:   CBC Recent Labs  Lab 05/20/18 0152  WBC 12.5*  HGB 13.8  HCT 41.3  PLT 156   ------------------------------------------------------------------------------------------------------------------  Chemistries  Recent Labs  Lab 05/20/18 0152  NA 139  K 4.0  CL 104  CO2 25  GLUCOSE 113*  BUN 18  CREATININE 1.04  CALCIUM 8.8*   ------------------------------------------------------------------------------------------------------------------  Cardiac Enzymes Recent Labs  Lab 05/20/18 0152  TROPONINI <0.03   ------------------------------------------------------------------------------------------------------------------  RADIOLOGY:  Dg Chest 2 View  Result Date: 05/20/2018 CLINICAL DATA:  Shortness of breath and chest tightness since yesterday. EXAM: CHEST - 2 VIEW COMPARISON:  Chest radiograph May 12, 2018 FINDINGS: Stable cardiomegaly. Tortuous calcified aorta. Severe LEFT bullous changes with chronic interstitial changes  confluent in lung bases. Similar hyperinflation with flattened hemidiaphragms. No pleural effusion or focal consolidation. No pneumothorax. Osteopenia. IMPRESSION: 1. COPD with similar LEFT bullous changes and bibasilar fibrosis. 2. Stable cardiomegaly. 3.  Aortic Atherosclerosis (ICD10-I70.0). Electronically Signed   By: Elon Alas M.D.   On: 05/20/2018 02:43    EKG:   Normal sinus rhythm 70 bpm  IMPRESSION AND PLAN:   1.  COPD exacerbation.  Switch Solu-Medrol to 40 mg IV twice daily starting for tomorrow.  Already received 125 mg today.  Budesonide and DuoNeb nebulizer solution.  Evaluate lungs on a daily basis.  Must stop smoking.  Empiric Zithromax. 2.  History of orthostatic hypotension and fall.  Check orthostatic vital signs. 3.  Type 2 diabetes mellitus on Glucophage 4.  History of colon cancer status post surgery.  She has 5.  Tobacco abuse.  Smoking cessation counseling done 4 minutes by me.  Must stop smoking.    All the records are reviewed and case discussed with ED provider. Management plans discussed with the patient, and he is in agreement.  CODE STATUS: DNR  TOTAL TIME TAKING CARE OF THIS PATIENT: 50 minutes, including ACP time patient needs more steroids.    Loletha Grayer M.D on 05/20/2018 at 12:38 PM  Between 7am to 6pm - Pager - [phone]  After 6pm call admission pager [phone]  [facility] Office  [phone]  CC: Primary care physician; Alphonsus Zohlandt

## 2018-05-20 NOTE — ED Triage Notes (Signed)
EMS pt to rm 26 from rest stop with report of shortness of breath. Pt here visiting his son. Pt lives in [place].

## 2018-05-21 ENCOUNTER — Inpatient Hospital Stay: Payer: Medicare (Managed Care)

## 2018-05-21 LAB — CBC
HCT: 36.1 % — ABNORMAL LOW (ref 40.0–52.0)
Hemoglobin: 12.5 g/dL — ABNORMAL LOW (ref 13.0–18.0)
MCH: 29.9 pg (ref 26.0–34.0)
MCHC: 34.6 g/dL (ref 32.0–36.0)
MCV: 86.5 fL (ref 80.0–100.0)
PLATELETS: 147 10*3/uL — AB (ref 150–440)
RBC: 4.17 MIL/uL — ABNORMAL LOW (ref 4.40–5.90)
RDW: 17.8 % — AB (ref 11.5–14.5)
WBC: 9.7 10*3/uL (ref 3.8–10.6)

## 2018-05-21 LAB — GLUCOSE, CAPILLARY
GLUCOSE-CAPILLARY: 211 mg/dL — AB (ref 70–99)
GLUCOSE-CAPILLARY: 151 mg/dL — AB (ref 70–99)
Glucose-Capillary: 203 mg/dL — ABNORMAL HIGH (ref 70–99)

## 2018-05-21 LAB — BASIC METABOLIC PANEL
Anion gap: 5 (ref 5–15)
BUN: 22 mg/dL (ref 8–23)
CALCIUM: 9 mg/dL (ref 8.9–10.3)
CO2: 30 mmol/L (ref 22–32)
CREATININE: 0.98 mg/dL (ref 0.61–1.24)
Chloride: 106 mmol/L (ref 98–111)
GFR calc Af Amer: >60 mL/min (ref >60)
Glucose, Bld: 147 mg/dL — ABNORMAL HIGH (ref 70–99)
Potassium: 3.8 mmol/L (ref 3.5–5.1)
SODIUM: 141 mmol/L (ref 135–145)

## 2018-05-21 MED ORDER — INSULIN ASPART 100 UNIT/ML ~~LOC~~ SOLN
0.0000 [IU] | Freq: Three times a day (TID) | SUBCUTANEOUS | Status: DC
  Administered 2018-05-21: 17:00:00 2 [IU] via SUBCUTANEOUS
  Administered 2018-05-21 – 2018-05-22 (×2): 3 [IU] via SUBCUTANEOUS
  Filled 2018-05-21 (×3): qty 1

## 2018-05-21 MED ORDER — INSULIN ASPART 100 UNIT/ML ~~LOC~~ SOLN
0.0000 [IU] | Freq: Every day | SUBCUTANEOUS | Status: DC
  Administered 2018-05-21 – 2018-05-22 (×2): 2 [IU] via SUBCUTANEOUS
  Filled 2018-05-21 (×2): qty 1

## 2018-05-21 MED ORDER — METHYLPREDNISOLONE SODIUM SUCC 40 MG IJ SOLR
40.0000 mg | Freq: Four times a day (QID) | INTRAMUSCULAR | Status: DC
  Administered 2018-05-21 – 2018-05-23 (×9): 40 mg via INTRAVENOUS
  Filled 2018-05-21 (×10): qty 1

## 2018-05-21 NOTE — Progress Notes (Signed)
[facility] [place] NAME: [name] [name]    MR#:  [id number]  DATE OF BIRTH:  04-23-1934  SUBJECTIVE:  CHIEF COMPLAINT:   Chief Complaint  Patient presents with  . Shortness of Breath   Still shortness of breath, dry cough and wheezing.  Right ankle and foot swelling and pain. REVIEW OF SYSTEMS:  Review of Systems  Constitutional: Negative for chills, fever and malaise/fatigue.  HENT: Negative for sore throat.   Eyes: Negative for blurred vision and double vision.  Respiratory: Positive for cough, shortness of breath and wheezing. Negative for hemoptysis, sputum production and stridor.   Cardiovascular: Negative for chest pain, palpitations, orthopnea and leg swelling.  Gastrointestinal: Negative for abdominal pain, blood in stool, diarrhea, melena, nausea and vomiting.  Genitourinary: Negative for dysuria, flank pain and hematuria.  Musculoskeletal: Positive for joint pain. Negative for back pain.  Skin: Negative for rash.  Neurological: Negative for dizziness, sensory change, focal weakness, seizures, loss of consciousness, weakness and headaches.  Endo/Heme/Allergies: Negative for polydipsia.  Psychiatric/Behavioral: Negative for depression. The patient is not nervous/anxious.     DRUG ALLERGIES:  No Known Allergies VITALS:  Blood pressure (!) 149/77, pulse 100, temperature 97.9 F (36.6 C), temperature source Oral, resp. rate 18, height 5\' 11"  (1.803 m), weight 77.3 kg, SpO2 99 %. PHYSICAL EXAMINATION:  Physical Exam  Constitutional: He is oriented to person, place, and time.  HENT:  Head: Normocephalic.  Mouth/Throat: Oropharynx is clear and moist.  Eyes: Pupils are equal, round, and reactive to light. Conjunctivae and EOM are normal. No scleral icterus.  Neck: Normal range of motion. Neck supple. No JVD present. No tracheal deviation present.  Cardiovascular: Normal rate, regular rhythm and normal heart sounds. Exam reveals  no gallop.  No murmur heard. Pulmonary/Chest: Effort normal. No respiratory distress. He has wheezes. He has no rales.  Abdominal: Soft. Bowel sounds are normal. He exhibits no distension. There is no tenderness. There is no rebound.  Musculoskeletal: Normal range of motion. He exhibits edema and tenderness.  Right ankle and foot edema and tenderness but no erythema.  Neurological: He is alert and oriented to person, place, and time. No cranial nerve deficit.  Skin: No rash noted. No erythema.  Psychiatric: He has a normal mood and affect.   LABORATORY PANEL:  Male CBC Recent Labs  Lab 05/21/18 0528  WBC 9.7  HGB 12.5*  HCT 36.1*  PLT 147*   ------------------------------------------------------------------------------------------------------------------ Chemistries  Recent Labs  Lab 05/21/18 0528  NA 141  K 3.8  CL 106  CO2 30  GLUCOSE 147*  BUN 22  CREATININE 0.98  CALCIUM 9.0   RADIOLOGY:  Dg Foot Complete Right  Result Date: 05/21/2018 CLINICAL DATA:  84 year old male with pain and swelling of the right foot for 1 week. No known injury. EXAM: RIGHT FOOT COMPLETE - 3+ VIEW COMPARISON:  None. FINDINGS: There is no evidence of fracture or dislocation. There is no evidence of arthropathy or other focal bone abnormality. Small focal ossification in the soft tissues plantar to the midfoot, perhaps within the plantar fascia. IMPRESSION: 1. No acute osseous abnormality. 2. Small ossification in the region of the plantar fascia may reflect chronic plantar fasciitis. Electronically Signed   By: Jacqulynn Cadet M.D.   On: 05/21/2018 11:06   ASSESSMENT AND PLAN:   1.  COPD exacerbation.  Continue steroids, Budesonide and DuoNeb nebulizer and Zithromax. 2.  History of orthostatic hypotension and fall.  PT evaluation  suggest outpatient PT. 3.  Type 2 diabetes mellitus, start sliding scale. 4.  History of colon cancer status post surgery. 5.  Tobacco abuse.  Smoking cessation  counseling. 6. Right ankle and foot swelling and pain.  Pain control. Xray: Small ossification in the region of the plantar fascia may reflect chronic plantar fasciitis. Podiatry consult.  All the records are reviewed and case discussed with Care Management/Social Worker. Management plans discussed with the patient, family and they are in agreement.  CODE STATUS: DNR  TOTAL TIME TAKING CARE OF THIS PATIENT: 33 minutes.   More than 50% of the time was spent in counseling/coordination of care: YES  POSSIBLE D/C IN 2 DAYS, DEPENDING ON CLINICAL CONDITION.   Demetrios Loll M.D on 05/21/2018 at 2:02 PM  Between 7am to 6pm - Pager - [phone]  After 6pm go to [email or web address] - [facility]

## 2018-05-21 NOTE — Progress Notes (Signed)
Patient states he wears 2.5liters of oxygen at home, and feels like he needs it now, patient has not been on oxygen this admission. Set up 2.5 liters after neb treatment per home regemine and patient WOB.

## 2018-05-21 NOTE — Plan of Care (Signed)

## 2018-05-21 NOTE — Progress Notes (Signed)
Physical Therapy Evaluation Patient Details Name: [name] [name] MRN: [id number] DOB: 1934-04-26 Today's Date: 05/21/2018   History of Present Illness  [name] [name]  is a 84 y.o. male with a known history of COPD presents with shortness of breath. He was eating at a [employer] and became short of breath.  He was walking and then had a fall in the grass.  Somebody helped him up and got him to his car and he called 911 for further evaluation.  In the ER they were initially watching him to go home the patient still had persistent shortness of breath and hospitalist services were contacted for further evaluation. He is now admitted for COPD exacerbation, orthostatic hypotension, and fall.   Clinical Impression  Pt admitted with above diagnosis. Pt currently with functional limitations due to the deficits listed below (see PT Problem List).  Pt is modified independent with bed mobility and transfers. He is able to complete a lap around RN station with therapist. SaO2 between 91-93% on room air HR around 110 bpm. Mildly unsteady with horizontal head turns but pt does not require external support. Pt with instability with narrow stance and attempts at single leg balance. He would benefit from OP PT for balance training. Encouraged pt to use a single point cane at discharge for added stability. Once he establishes care with doctors in [place] he may be a good candidate for pulmonary rehab secondary to his COPD. Pt will benefit from PT services to address deficits in strength, balance, and mobility in order to return to full function at home.     Follow Up Recommendations Outpatient PT    Equipment Recommendations  Other (comment)(Pt encouraged to purchase and use a single point cane)    Recommendations for Other Services       Precautions / Restrictions Precautions Precautions: Fall Restrictions Weight Bearing Restrictions: No      Mobility  Bed Mobility Overal bed mobility: Modified Independent              General bed mobility comments: Fair speed and sequencing  Transfers Overall transfer level: Modified independent Equipment used: None             General transfer comment: Good safety and stability once upright in standing without UE support  Ambulation/Gait Ambulation/Gait assistance: Min guard Gait Distance (Feet): 150 Feet Assistive device: None       General Gait Details: Pt is able to complete a lap around RN station with therapist. SaO2 between 91-93% on room air HR around 110 bpm. Mildly unsteady with horizontal head turns but pt does not require external support  Stairs            Wheelchair Mobility    Modified Rankin (Stroke Patients Only)       Balance Overall balance assessment: Needs assistance Sitting-balance support: No upper extremity supported Sitting balance-Leahy Scale: Good     Standing balance support: No upper extremity supported Standing balance-Leahy Scale: Fair Standing balance comment: Able to maintain wide stance but imbalance when placing feet together. Single leg balance is less than 2 seconds on each leg                             Pertinent Vitals/Pain Pain Assessment: No/denies pain    Home Living Family/patient expects to be discharged to:: Unsure Living Arrangements: Alone               Additional Comments: Pt  has been staying in [place] since coming up from [place] to relocate. He currently has all of his belongings in his car in the [facility] parking lot. No definitive place to stay after discharge    Prior Function Level of Independence: Independent         Comments: Independent with ADLs/IADLs. Drives. Reports falls from syncope     Hand Dominance   Dominant Hand: Right    Extremity/Trunk Assessment   Upper Extremity Assessment Upper Extremity Assessment: Overall WFL for tasks assessed    Lower Extremity Assessment Lower Extremity Assessment: Overall WFL for tasks assessed        Communication   Communication: No difficulties  Cognition Arousal/Alertness: Awake/alert Behavior During Therapy: WFL for tasks assessed/performed Overall Cognitive Status: Within Functional Limits for tasks assessed                                        General Comments      Exercises     Assessment/Plan    PT Assessment Patient needs continued PT services  PT Problem List Decreased strength;Decreased activity tolerance;Decreased balance       PT Treatment Interventions DME instruction;Gait training;Stair training;Functional mobility training;Therapeutic activities;Balance training;Therapeutic exercise;Neuromuscular re-education;Patient/family education    PT Goals (Current goals can be found in the Care Plan section)  Acute Rehab PT Goals Patient Stated Goal: Return to prior function PT Goal Formulation: With patient Time For Goal Achievement: 06/04/18 Potential to Achieve Goals: Good    Frequency Min 2X/week   Barriers to discharge Decreased caregiver support Living in motels currently    Co-evaluation               AM-PAC PT "6 Clicks" Daily Activity  Outcome Measure Difficulty turning over in bed (including adjusting bedclothes, sheets and blankets)?: None Difficulty moving from lying on back to sitting on the side of the bed? : None Difficulty sitting down on and standing up from a chair with arms (e.g., wheelchair, bedside commode, etc,.)?: A Little Help needed moving to and from a bed to chair (including a wheelchair)?: None Help needed walking in hospital room?: A Little Help needed climbing 3-5 steps with a railing? : A Little 6 Click Score: 21    End of Session Equipment Utilized During Treatment: Gait belt Activity Tolerance: Patient tolerated treatment well Patient left: in bed;with call bell/phone within reach;with bed alarm set   PT Visit Diagnosis: Unsteadiness on feet (R26.81);Muscle weakness (generalized)  (M62.81);Difficulty in walking, not elsewhere classified (R26.2)    Time: 1055-1110 PT Time Calculation (min) (ACUTE ONLY): 15 min   Charges:   PT Evaluation $PT Eval Low Complexity: 1 Low          Arayna Illescas D Zaheer Wageman PT, DPT, GCS   Candence Sease 05/21/2018, 1:00 PM

## 2018-05-21 NOTE — Consult Note (Signed)
ORTHOPAEDIC CONSULTATION  REQUESTING PHYSICIAN: Demetrios Loll, MD  Chief Complaint: Bilateral foot pain.  He said recent swelling of both feet.  He states the right side is little more swollen than the left at this point.  He had x-rays but these were negative.  He states with ambulation there is sore.  He denies a history of gout.  HPI: [name] [name] is a 84 y.o. male who complains of as above  Past Medical History:  Diagnosis Date  . ABSCESS 12/03/2009  . ABSCESS, FINGER 04/07/2010  . ANXIETY 11/03/2009  . ASTHMA 11/03/2009  . CHRONIC OBSTRUCTIVE PULMONARY DISEASE, ACUTE EXACERBATION 11/03/2009  . Colon cancer ([place])   . COPD 11/03/2009  . DEPRESSION 11/03/2009  . DIABETES MELLITUS, TYPE II 11/03/2009  . [place] DISEASE, LUMBAR 11/03/2009  . EMPHYSEMA, BULLOUS 11/03/2009  . GERD 11/03/2009  . HYPERLIPIDEMIA 11/03/2009  . HYPERTENSION 11/03/2009  . Kidney stones 01/30/12   "I've had them 7 times; always have passed them"  . PEPTIC ULCER DISEASE 11/03/2009  . Pneumonia   . RASH-NONVESICULAR 11/03/2009  . RESTLESS LEG SYNDROME 11/03/2009  . Shortness of breath    "sometimes; at any time"  . SPINAL STENOSIS, LUMBAR 11/03/2009   Past Surgical History:  Procedure Laterality Date  . Colon cancer surgery    . [place]   left  . INGUINAL HERNIA REPAIR  10/2011   left  . ROTATOR CUFF REPAIR  2003   left  . TONSILLECTOMY  1960   Social History   Socioeconomic History  . Marital status: Widowed    Spouse name: Not on file  . Number of children: 2  . Years of education: Not on file  . Highest education level: Not on file  Occupational History    Employer: RETIRED  Social Needs  . Financial resource strain: Not on file  . Food insecurity:    Worry: Not on file    Inability: Not on file  . Transportation needs:    Medical: Not on file    Non-medical: Not on file  Tobacco Use  . Smoking status: Current Every Day Smoker    Packs/day: 1.00    Years: 41.00    Pack years:  41.00    Types: Cigarettes  . Smokeless tobacco: Never Used  . Tobacco comment: "stopped smoking 04/21/1991 then restarted in 2012"  Substance and Sexual Activity  . Alcohol use: No  . Drug use: No  . Sexual activity: Not Currently    Birth control/protection: None  Lifestyle  . Physical activity:    Days per week: Not on file    Minutes per session: Not on file  . Stress: Not on file  Relationships  . Social connections:    Talks on phone: Not on file    Gets together: Not on file    Attends religious service: Not on file    Active member of club or organization: Not on file    Attends meetings of clubs or organizations: Not on file    Relationship status: Not on file  Other Topics Concern  . Not on file  Social History Narrative  . Not on file   Family History  Problem Relation Age of Onset  . Heart disease Father   . Heart disease Mother   . Cancer Brother        lung   No Known Allergies Prior to Admission medications   Medication Sig Start Date End Date Taking? Authorizing Provider  albuterol (PROVENTIL HFA;VENTOLIN HFA) 108 (90 Base) MCG/ACT inhaler Inhale 1-2 puffs into the lungs every 6 (six) hours as needed for wheezing or shortness of breath.  06/03/13  Yes [provider]  aspirin 81 MG chewable tablet Chew 1 tablet daily by mouth. 12/05/12  Yes [provider]  atorvastatin (LIPITOR) 40 MG tablet Take 1 tablet (40 mg total) by mouth daily. 12/16/16  Yes Mody, Ulice Bold, MD  Fluticasone-Salmeterol (ADVAIR) 250-50 MCG/DOSE AEPB Inhale 1 puff into the lungs 2 (two) times daily. 12/05/12  Yes [provider]  ipratropium-albuterol (DUONEB) 0.5-2.5 (3) MG/3ML SOLN Take 3 mLs by nebulization every 6 (six) hours as needed (as needed for shortness of breath). 03/26/17  Yes Arrien, Jimmy Picket, MD  metFORMIN (GLUCOPHAGE) 500 MG tablet Take 500 mg by mouth 2 (two) times daily with a meal.   Yes [provider]  midodrine (PROAMATINE) 5 MG  tablet TAKE 1 TABLET BY MOUTH THREE TIMES A DAY BEFORE MEALS FOR 30 DAYS 05/11/18  Yes [provider]  pantoprazole (PROTONIX) 40 MG tablet TAKE 1 TABLET BY MOUTH EVERY DAY IN THE MORNING BEFORE BREAKFAST FOR 30 DAYS 05/11/18  Yes [provider]  predniSONE (DELTASONE) 10 MG tablet Take 1 tablet (10 mg total) by mouth daily. Day 1-3: take 4 tablets PO daily Day 4-6: take 3 tablets PO daily Day 7-9: take 2 tablets PO daily Day 10-12: take 1 tablet PO daily 05/12/18  Yes Paduchowski, Lennette Bihari, MD  tamsulosin (FLOMAX) 0.4 MG CAPS capsule Take 0.4 mg daily by mouth.   Yes [provider]  albuterol (PROVENTIL HFA;VENTOLIN HFA) 108 (90 Base) MCG/ACT inhaler Inhale 2 puffs into the lungs every 6 (six) hours as needed. 05/20/18   Loney Hering, MD  nitroGLYCERIN (NITROSTAT) 0.4 MG SL tablet Place 1 tablet as needed under the tongue. 04/16/17   [provider]  nystatin (MYCOSTATIN/NYSTOP) powder Apply topically 4 (four) times daily. 05/20/18   Loney Hering, MD  predniSONE (STERAPRED UNI-PAK 21 TAB) 10 MG (21) TBPK tablet Take 6 tabs on day 1 Take 5 tabs on day 2 Take 4 tabs on day 3 Take 3 tabs on day 4 Take 2 tabs on day 5 Take 1 tab on day 6 05/20/18   Loney Hering, MD   Dg Chest 2 View  Result Date: 05/20/2018 CLINICAL DATA:  Shortness of breath and chest tightness since yesterday. EXAM: CHEST - 2 VIEW COMPARISON:  Chest radiograph May 12, 2018 FINDINGS: Stable cardiomegaly. Tortuous calcified aorta. Severe LEFT bullous changes with chronic interstitial changes confluent in lung bases. Similar hyperinflation with flattened hemidiaphragms. No pleural effusion or focal consolidation. No pneumothorax. Osteopenia. IMPRESSION: 1. COPD with similar LEFT bullous changes and bibasilar fibrosis. 2. Stable cardiomegaly. 3.  Aortic Atherosclerosis (ICD10-I70.0). Electronically Signed   By: Elon Alas M.D.   On: 05/20/2018 02:43   Dg Foot Complete  Right  Result Date: 05/21/2018 CLINICAL DATA:  84 year old male with pain and swelling of the right foot for 1 week. No known injury. EXAM: RIGHT FOOT COMPLETE - 3+ VIEW COMPARISON:  None. FINDINGS: There is no evidence of fracture or dislocation. There is no evidence of arthropathy or other focal bone abnormality. Small focal ossification in the soft tissues plantar to the midfoot, perhaps within the plantar fascia. IMPRESSION: 1. No acute osseous abnormality. 2. Small ossification in the region of the plantar fascia may reflect chronic plantar fasciitis. Electronically Signed   By: Jacqulynn Cadet M.D.   On:  05/21/2018 11:06    Positive ROS: All other systems have been reviewed and were otherwise negative with the exception of those mentioned in the HPI and as above.  12 point ROS was performed.  Physical Exam: General: Alert and oriented.  No apparent distress.  Vascular:  Left foot:Dorsalis Pedis:  present Posterior Tibial:  present  Right foot: Dorsalis Pedis:  present Posterior Tibial:  present  Neuro:intact  Derm: Mild tinea pedis bilaterally.  Onychomycosis noted.  No paronychia.  He has hyperkeratotic lesion sub-fifth MTPJ's bilaterally that are very painful form.  Ortho/MS: Full range of motion the ankle subtalar metatarsal metatarsophalangeal joints.  He does have some mild pitting edema to the lateral aspect of the ankle and foot.  No focal pain with range of motion.  No pain with metatarsal or ankle joint range of motion.  Assessment: Diffuse right foot and ankle edema. Complaint of foot pain  Plan: At this point he does have some mild edema to the right lower extremity but compression hose could be beneficial for this.  He had hyperkeratotic lesions that were quite painful for him with palpation.  I was able to just superficially debride this with a 15 blade.  At this point no acute issues.  This does not appear to be gout.  There is no signs of fracture.  Range of motion is  within normal limits.  He can follow-up outpatient clinic for diffuse bilateral foot pain.    Elesa Hacker, DPM Cell [phone]   05/21/2018 7:49 PM

## 2018-05-22 LAB — GLUCOSE, CAPILLARY
GLUCOSE-CAPILLARY: 140 mg/dL — AB (ref 70–99)
Glucose-Capillary: 209 mg/dL — ABNORMAL HIGH (ref 70–99)
Glucose-Capillary: 168 mg/dL — ABNORMAL HIGH (ref 70–99)
Glucose-Capillary: 207 mg/dL — ABNORMAL HIGH (ref 70–99)

## 2018-05-22 MED ORDER — TOLNAFTATE 1 % EX POWD: Freq: Two times a day (BID) | CUTANEOUS | Status: DC

## 2018-05-22 MED ORDER — IPRATROPIUM-ALBUTEROL 0.5-2.5 (3) MG/3ML IN SOLN
3.0000 mL | RESPIRATORY_TRACT | Status: DC | PRN
  Administered 2018-05-23: 04:00:00 3 mL via RESPIRATORY_TRACT
  Filled 2018-05-22: qty 3

## 2018-05-22 MED ORDER — MICONAZOLE NITRATE 2 % EX CREA
TOPICAL_CREAM | Freq: Two times a day (BID) | CUTANEOUS | Status: DC
  Administered 2018-05-22 – 2018-05-24 (×3): via TOPICAL
  Filled 2018-05-22: qty 14

## 2018-05-22 MED ORDER — INSULIN ASPART 100 UNIT/ML ~~LOC~~ SOLN
0.0000 [IU] | Freq: Three times a day (TID) | SUBCUTANEOUS | Status: DC
  Administered 2018-05-22: 2 [IU] via SUBCUTANEOUS
  Administered 2018-05-22: 3 [IU] via SUBCUTANEOUS
  Administered 2018-05-23: 2 [IU] via SUBCUTANEOUS
  Administered 2018-05-23: 12:00:00 5 [IU] via SUBCUTANEOUS
  Administered 2018-05-23: 09:00:00 3 [IU] via SUBCUTANEOUS
  Administered 2018-05-24: 12:00:00 2 [IU] via SUBCUTANEOUS
  Filled 2018-05-22 (×6): qty 1

## 2018-05-22 MED ORDER — IPRATROPIUM-ALBUTEROL 0.5-2.5 (3) MG/3ML IN SOLN
3.0000 mL | Freq: Two times a day (BID) | RESPIRATORY_TRACT | Status: DC
  Administered 2018-05-22 – 2018-05-24 (×4): 3 mL via RESPIRATORY_TRACT
  Filled 2018-05-22 (×4): qty 3

## 2018-05-22 NOTE — Care Management (Addendum)
Case discussed with Dr. Bridgett Larsson and he doesn't feel patient needs any PT follow up at this time. Patient from [place]. He drove himself here per MD. Please advise if RNCM can assist any further.

## 2018-05-22 NOTE — Progress Notes (Signed)
Physical Therapy Treatment Patient Details Name: [name] [name] MRN: [id number] DOB: January 02, 1934 Today's Date: 05/22/2018    History of Present Illness [name] [name]  is a 84 y.o. male with a known history of COPD presents with shortness of breath. He was eating at a [employer] and became short of breath.  He was walking and then had a fall in the grass.  Somebody helped him up and got him to his car and he called 911 for further evaluation.  In the ER they were initially watching him to go home the patient still had persistent shortness of breath and hospitalist services were contacted for further evaluation. He is now admitted for COPD exacerbation, orthostatic hypotension, and fall.     PT Comments    Pt exiting bathroom on his own upon entering room.  Pt with bare feet.  He had one LOB backwards which was recovered with min a x 1.  Stated he does this "often"  Pt given gripper socks for safety.  Stated he has been walking to and from bathroom on his own since admission.  Pt with sats mid 90's on room air during session.  Stated he wears O2 at home at night and "When I need it during the day."  Pt open to trying [facility] today.  Ambulated x 2 laps around small nursing unit.  While gait is improved with SPC, balance remains impaired.  After short seated rest, he completed 2 more laps with walker.    Pt agreed that his gait was improved with rolling walker and agreed that having one upon discharge would be beneficial.  Educated on purchasing SPC when overall strength and balance was improved but at this time Rolling walker was a safer option.     Follow Up Recommendations  Outpatient PT     Equipment Recommendations  Rolling walker with 5" wheels;Cane    Recommendations for Other Services       Precautions / Restrictions Precautions Precautions: Fall Restrictions Weight Bearing Restrictions: No    Mobility  Bed Mobility Overal bed mobility: Modified Independent                 Transfers Overall transfer level: Modified independent Equipment used: Rolling walker (2 wheeled);Straight cane                Ambulation/Gait Ambulation/Gait assistance: Min guard;Min assist Gait Distance (Feet): 150 Feet Assistive device: Rolling walker (2 wheeled);Straight cane Gait Pattern/deviations: Step-through pattern;Leaning posteriorly   Gait velocity interpretation: 1.31 - 2.62 ft/sec, indicative of limited community ambulator General Gait Details: 2 laps with SPC and 2 laps with RW.  Balance significantly improved with RW vc SPC and pt agreed that RW would be beneficial upon discharge.   Stairs             Wheelchair Mobility    Modified Rankin (Stroke Patients Only)       Balance Overall balance assessment: Needs assistance Sitting-balance support: No upper extremity supported Sitting balance-Leahy Scale: Good     Standing balance support: No upper extremity supported Standing balance-Leahy Scale: Poor Standing balance comment: generally unsteady today in standing with one LOB noted that was recovered with min a x 1 upon exiting bathroom on his own.                            Cognition Arousal/Alertness: Awake/alert Behavior During Therapy: WFL for tasks assessed/performed Overall Cognitive Status: Within Functional Limits for tasks  assessed                                        Exercises      General Comments        Pertinent Vitals/Pain Pain Assessment: No/denies pain    Home Living                      Prior Function            PT Goals (current goals can now be found in the care plan section) Progress towards PT goals: Progressing toward goals    Frequency    Min 2X/week      PT Plan Current plan remains appropriate    Co-evaluation              AM-PAC PT "6 Clicks" Daily Activity  Outcome Measure  Difficulty turning over in bed (including adjusting bedclothes,  sheets and blankets)?: None Difficulty moving from lying on back to sitting on the side of the bed? : None Difficulty sitting down on and standing up from a chair with arms (e.g., wheelchair, bedside commode, etc,.)?: None Help needed moving to and from a bed to chair (including a wheelchair)?: None Help needed walking in hospital room?: A Little Help needed climbing 3-5 steps with a railing? : A Little 6 Click Score: 22    End of Session Equipment Utilized During Treatment: Gait belt Activity Tolerance: Patient tolerated treatment well Patient left: in bed;with call bell/phone within reach         Time: 1152-1213 PT Time Calculation (min) (ACUTE ONLY): 21 min  Charges:  $Gait Training: 8-22 mins                     Chesley Noon, PTA 05/22/18, 12:31 PM

## 2018-05-22 NOTE — Progress Notes (Signed)
[facility] [place] [place] NAME: [name] [name]    MR#:  [id number]  DATE OF BIRTH:  Mar 02, 1934  SUBJECTIVE:  CHIEF COMPLAINT:   Chief Complaint  Patient presents with  . Shortness of Breath   Better shortness of breath, dry cough and wheezing.  Right ankle and foot swelling and pain. On O2 [place] 2L (prn at home) REVIEW OF SYSTEMS:  Review of Systems  Constitutional: Negative for chills, fever and malaise/fatigue.  HENT: Negative for sore throat.   Eyes: Negative for blurred vision and double vision.  Respiratory: Positive for cough, shortness of breath and wheezing. Negative for hemoptysis, sputum production and stridor.   Cardiovascular: Negative for chest pain, palpitations, orthopnea and leg swelling.  Gastrointestinal: Negative for abdominal pain, blood in stool, diarrhea, melena, nausea and vomiting.  Genitourinary: Negative for dysuria, flank pain and hematuria.  Musculoskeletal: Positive for joint pain. Negative for back pain.  Skin: Negative for rash.  Neurological: Negative for dizziness, sensory change, focal weakness, seizures, loss of consciousness, weakness and headaches.  Endo/Heme/Allergies: Negative for polydipsia.  Psychiatric/Behavioral: Negative for depression. The patient is not nervous/anxious.     DRUG ALLERGIES:  No Known Allergies VITALS:  Blood pressure (!) 151/80, pulse (!) 105, temperature (!) 97.5 F (36.4 C), temperature source Oral, resp. rate 20, height 5\' 11"  (1.803 m), weight 77.3 kg, SpO2 97 %. PHYSICAL EXAMINATION:  Physical Exam  Constitutional: He is oriented to person, place, and time.  HENT:  Head: Normocephalic.  Mouth/Throat: Oropharynx is clear and moist.  Eyes: Pupils are equal, round, and reactive to light. Conjunctivae and EOM are normal. No scleral icterus.  Neck: Normal range of motion. Neck supple. No JVD present. No tracheal deviation present.  Cardiovascular: Normal rate, regular rhythm and  normal heart sounds. Exam reveals no gallop.  No murmur heard. Pulmonary/Chest: Effort normal. No respiratory distress. He has wheezes. He has no rales.  Abdominal: Soft. Bowel sounds are normal. He exhibits no distension. There is no tenderness. There is no rebound.  Musculoskeletal: Normal range of motion. He exhibits edema and tenderness.  Right ankle and foot edema and tenderness but no erythema.  Neurological: He is alert and oriented to person, place, and time. No cranial nerve deficit.  Skin: No rash noted. No erythema.  Psychiatric: He has a normal mood and affect.   LABORATORY PANEL:  Male CBC Recent Labs  Lab 05/21/18 0528  WBC 9.7  HGB 12.5*  HCT 36.1*  PLT 147*   ------------------------------------------------------------------------------------------------------------------ Chemistries  Recent Labs  Lab 05/21/18 0528  NA 141  K 3.8  CL 106  CO2 30  GLUCOSE 147*  BUN 22  CREATININE 0.98  CALCIUM 9.0   RADIOLOGY:  No results found. ASSESSMENT AND PLAN:   1.  COPD exacerbation.  Continue steroids, Budesonide and DuoNeb nebulizer and Zithromax.  2.  History of orthostatic hypotension and fall.  PT evaluation suggest outpatient PT.  3.  Type 2 diabetes mellitus, on sliding scale. 4.  History of colon cancer status post surgery. 5.  Tobacco abuse.  Smoking cessation counseling. 6. Right ankle and foot swelling and pain.  Pain control. Xray: Small ossification in the region of the plantar fascia may reflect chronic plantar fasciitis. He can follow-up outpatient clinic for diffuse bilateral foot pain per Dr. Vickki Muff.  All the records are reviewed and case discussed with Care Management/Social Worker. Management plans discussed with the patient, family and they are in agreement.  CODE STATUS:  DNR  TOTAL TIME TAKING CARE OF THIS PATIENT: 33 minutes.   More than 50% of the time was spent in counseling/coordination of care: YES  POSSIBLE D/C IN 1-2 DAYS,  DEPENDING ON CLINICAL CONDITION.   Demetrios Loll M.D on 05/22/2018 at 11:57 AM  Between 7am to 6pm - Pager - [phone]  After 6pm go to [email or web address] - [facility]

## 2018-05-22 NOTE — Plan of Care (Signed)

## 2018-05-23 LAB — GLUCOSE, CAPILLARY
GLUCOSE-CAPILLARY: 199 mg/dL — AB (ref 70–99)
GLUCOSE-CAPILLARY: 136 mg/dL — AB (ref 70–99)
Glucose-Capillary: 216 mg/dL — ABNORMAL HIGH (ref 70–99)
Glucose-Capillary: 134 mg/dL — ABNORMAL HIGH (ref 70–99)

## 2018-05-23 MED ORDER — PREDNISONE 50 MG PO TABS
50.0000 mg | ORAL_TABLET | Freq: Every day | ORAL | Status: DC
  Administered 2018-05-24: 10:00:00 50 mg via ORAL
  Filled 2018-05-23: qty 1

## 2018-05-23 NOTE — Progress Notes (Signed)
[name] [name] at [facility] NAME: [name] [name]    MR#:  [id number]  DATE OF BIRTH:  [birth year]  SUBJECTIVE:  CHIEF COMPLAINT:   Chief Complaint  Patient presents with  . Shortness of Breath  Patient does not feel like going home today, patient states that he does not feel up to, patient states that he has weakness/fatigue, unable to ambulate well, dizzy when up  REVIEW OF SYSTEMS:  CONSTITUTIONAL: No fever, fatigue or weakness.  EYES: No blurred or double vision.  EARS, NOSE, AND THROAT: No tinnitus or ear pain.  RESPIRATORY: No cough, shortness of breath, wheezing or hemoptysis.  CARDIOVASCULAR: No chest pain, orthopnea, edema.  GASTROINTESTINAL: No nausea, vomiting, diarrhea or abdominal pain.  GENITOURINARY: No dysuria, hematuria.  ENDOCRINE: No polyuria, nocturia,  HEMATOLOGY: No anemia, easy bruising or bleeding SKIN: No rash or lesion. MUSCULOSKELETAL: No joint pain or arthritis.   NEUROLOGIC: No tingling, numbness, weakness.  PSYCHIATRY: No anxiety or depression.   ROS  DRUG ALLERGIES:  No Known Allergies  VITALS:  Blood pressure (!) 156/92, pulse 99, temperature 98 F (36.7 C), temperature source Oral, resp. rate 18, height 5\' 11"  (1.803 m), weight 77.3 kg, SpO2 94 %.  PHYSICAL EXAMINATION:  GENERAL:  84 y.o.-year-old patient lying in the bed with no acute distress.  EYES: Pupils equal, round, reactive to light and accommodation. No scleral icterus. Extraocular muscles intact.  HEENT: Head atraumatic, normocephalic. Oropharynx and nasopharynx clear.  NECK:  Supple, no jugular venous distention. No thyroid enlargement, no tenderness.  LUNGS: Normal breath sounds bilaterally, no wheezing, rales,rhonchi or crepitation. No use of accessory muscles of respiration.  CARDIOVASCULAR: S1, S2 normal. No murmurs, rubs, or gallops.  ABDOMEN: Soft, nontender, nondistended. Bowel sounds present. No organomegaly or mass.  EXTREMITIES: No pedal  edema, cyanosis, or clubbing.  NEUROLOGIC: Cranial nerves II through XII are intact. Muscle strength 5/5 in all extremities. Sensation intact. Gait not checked.  PSYCHIATRIC: The patient is alert and oriented x 3.  SKIN: No obvious rash, lesion, or ulcer.   Physical Exam LABORATORY PANEL:   CBC Recent Labs  Lab 05/21/18 0528  WBC 9.7  HGB 12.5*  HCT 36.1*  PLT 147*   ------------------------------------------------------------------------------------------------------------------  Chemistries  Recent Labs  Lab 05/21/18 0528  NA 141  K 3.8  CL 106  CO2 30  GLUCOSE 147*  BUN 22  CREATININE 0.98  CALCIUM 9.0   ------------------------------------------------------------------------------------------------------------------  Cardiac Enzymes Recent Labs  Lab 05/20/18 0152  TROPONINI <0.03   ------------------------------------------------------------------------------------------------------------------  RADIOLOGY:  No results found.  ASSESSMENT AND PLAN:    1. COPD exacerbation Resolving Discontinue IV Solu-Medrol, start prednisone taper, continue inhaled corticosteroids, breathing treatments, p.o. azithromycin  2. History of orthostatic hypotension w/fall Physical therapy input appreciated-rolling walker with 5 inch wheels, outpatient PT, check orthostatics daily, ambulate with assistance.  3. Type 2 diabetes mellitus Stable on current regiment  4.History of colon cancer s/p surgery Stable  5. Tobacco abuse Stable Continue smoking cessation counseling  6. Right ankle/foot swelling/pain Resolved Xray: Small ossification in the region of the plantar fascia may reflect chronic plantar fasciitis. Will f/u with Dr. Blima Dessert for further evaluation/care.    All the records are reviewed and case discussed with Care Management/Social Workerr. Management plans discussed with the patient, family and they are in agreement.  CODE STATUS:  dnr  TOTAL TIME TAKING CARE OF THIS PATIENT: 40 minutes.     POSSIBLE D/C IN 1 DAYS, DEPENDING ON CLINICAL CONDITION.  Avel Peace Salary M.D on 05/23/2018   Between 7am to 6pm - Pager - [phone]  After 6pm go to [email or web address] - password [email or web address]  Office  [phone]  CC: Primary care physician; System, Pcp Not In  Note: This dictation was prepared with Dragon dictation along with smaller phrase technology. Any transcriptional errors that result from this process are unintentional.

## 2018-05-23 NOTE — Plan of Care (Signed)

## 2018-05-23 NOTE — Care Management Important Message (Signed)
Important Message  Patient Details  Name: [name] [name] MRN: [id number] Date of Birth: 1933/10/26   Medicare Important Message Given:  Yes    [name] 05/23/2018, 10:59 AM

## 2018-05-23 NOTE — Progress Notes (Signed)
PT Cancellation Note  Patient Details Name: [name] [name] MRN: [id number] DOB: 04/30/1934   Cancelled Treatment:    Reason Eval/Treat Not Completed: Patient declined, no reason specified. Treatment attempted, pt refuses at this time, noting he has walked already. Pt wishing to rest in bed. Re attempt at a later time/date, as the schedule allows.    Larae Grooms, PTA 05/23/2018, 1:20 PM

## 2018-05-24 LAB — GLUCOSE, CAPILLARY
GLUCOSE-CAPILLARY: 126 mg/dL — AB (ref 70–99)
Glucose-Capillary: 105 mg/dL — ABNORMAL HIGH (ref 70–99)

## 2018-05-24 MED ORDER — ALBUTEROL SULFATE HFA 108 (90 BASE) MCG/ACT IN AERS: 2.0000 | INHALATION_SPRAY | Freq: Four times a day (QID) | RESPIRATORY_TRACT | 6 refills | Status: DC | PRN

## 2018-05-24 MED ORDER — PREDNISONE 50 MG PO TABS: ORAL_TABLET | ORAL | 0 refills | Status: DC

## 2018-05-24 NOTE — Progress Notes (Signed)
Physical Therapy Treatment Patient Details Name: [name] [name] MRN: [id number] DOB: [birth year] Today's Date: 05/24/2018    History of Present Illness [name] [name]  is a 84 y.o. male with a known history of COPD presents with shortness of breath. He was eating at a [employer] and became short of breath.  He was walking and then had a fall in the grass.  Somebody helped him up and got him to his car and he called 911 for further evaluation.  In the ER they were initially watching him to go home the patient still had persistent shortness of breath and hospitalist services were contacted for further evaluation. He is now admitted for COPD exacerbation, orthostatic hypotension, and fall.     PT Comments    Pt in bed, reports general malaise "I don't feel good."  Pt voices frustration over health decline.  "Yesterday I was walking around falling all over the place"  Orthostatic BP's taken and documented in flow sheets.  Overall generally steady.  Primary concern was HR 118 prior to gait that dropped to 56 after 2 laps around small nursing station with one seated rest break as he reported feeling "fuzzy" but at that time, HR was stable in 110's.  Gait with walker remains with post sway at times and pt should have +1 assist for safety.  Discussed with primary RN Ok Edwards.  Re-enforced with pt to have +1 assist for safety.  Pt reports he plans to go to hotel upon discharge as he stated his son's house is "full."  Pt would benefit from +1 assist with mobility as he remains a higher fall risk at this time.    Follow Up Recommendations  Outpatient PT;Supervision for mobility/OOB     Equipment Recommendations  Rolling walker with 5" wheels;Cane    Recommendations for Other Services       Precautions / Restrictions Precautions Precautions: Fall Restrictions Weight Bearing Restrictions: No    Mobility  Bed Mobility Overal bed mobility: Modified Independent                Transfers Overall  transfer level: Modified independent                  Ambulation/Gait Ambulation/Gait assistance: Min guard Gait Distance (Feet): 150 Feet Assistive device: Rolling walker (2 wheeled)     Gait velocity interpretation: 1.31 - 2.62 ft/sec, indicative of limited community ambulator General Gait Details: 2 laps with 1 seated rest due to feeling "fuzzy" encouraged by Probation officer.  unsteady at tmes  - post sway - but able to self correct with walker.   Stairs             Wheelchair Mobility    Modified Rankin (Stroke Patients Only)       Balance Overall balance assessment: Needs assistance Sitting-balance support: No upper extremity supported Sitting balance-Leahy Scale: Good     Standing balance support: No upper extremity supported Standing balance-Leahy Scale: Poor Standing balance comment: needs RW for balance                            Cognition Arousal/Alertness: Awake/alert Behavior During Therapy: WFL for tasks assessed/performed Overall Cognitive Status: Within Functional Limits for tasks assessed                                 General Comments: voiced frustrations over health  Exercises      General Comments        Pertinent Vitals/Pain Pain Assessment: No/denies pain    Home Living                      Prior Function            PT Goals (current goals can now be found in the care plan section) Progress towards PT goals: Progressing toward goals    Frequency    Min 2X/week      PT Plan Current plan remains appropriate    Co-evaluation              AM-PAC PT "6 Clicks" Daily Activity  Outcome Measure  Difficulty turning over in bed (including adjusting bedclothes, sheets and blankets)?: None Difficulty moving from lying on back to sitting on the side of the bed? : None Difficulty sitting down on and standing up from a chair with arms (e.g., wheelchair, bedside commode, etc,.)?:  None Help needed moving to and from a bed to chair (including a wheelchair)?: None Help needed walking in hospital room?: A Little Help needed climbing 3-5 steps with a railing? : A Little 6 Click Score: 22    End of Session Equipment Utilized During Treatment: Gait belt Activity Tolerance: Patient limited by fatigue;Other (comment) Patient left: in bed;with call bell/phone within reach Nurse Communication: Mobility status;Other (comment)       Time: 1901-2224 PT Time Calculation (min) (ACUTE ONLY): 27 min  Charges:  $Gait Training: 8-22 mins $Therapeutic Activity: 8-22 mins                    Chesley Noon, PTA 05/24/18, 10:10 AM

## 2018-05-24 NOTE — Clinical Social Work Note (Signed)
CSW consulted again for homelessness. CSW met with patient. He states that he can not stay with his son because he has too many people staying with him. CSW offered to call the shelter for patient and he refused stating " I will just go stay in [place]." Patient states that he has friends in [place] and he will drive himself there. Patient states that his car is in the parking lot and he can drive himself. CSW asked if patient needs anything else. Patient states he does not need anything else and wants to know when he is leaving. CSW explained that nurse will inform him when he can leave. CSW notified RN of above conversation. CSW signing off. Please reconsult if further needs arise.   [place], [place]

## 2018-05-24 NOTE — Discharge Summary (Signed)
[facility] NAME: [name] [name]    MR#:  [id number]  DATE OF BIRTH:  April 07, 1934  DATE OF ADMISSION:  05/20/2018 ADMITTING PHYSICIAN: Loletha Grayer, MD  DATE OF DISCHARGE: No discharge date for patient encounter.  PRIMARY CARE PHYSICIAN: System, Pcp Not In    ADMISSION DIAGNOSIS:  Shortness of breath [R06.02] Intertrigo [L30.4] COPD exacerbation (HCC) [J44.1]  DISCHARGE DIAGNOSIS:  Active Problems:   COPD exacerbation (HCC)   SECONDARY DIAGNOSIS:   Past Medical History:  Diagnosis Date  . ABSCESS 12/03/2009  . ABSCESS, FINGER 04/07/2010  . ANXIETY 11/03/2009  . ASTHMA 11/03/2009  . CHRONIC OBSTRUCTIVE PULMONARY DISEASE, ACUTE EXACERBATION 11/03/2009  . Colon cancer ([place])   . COPD 11/03/2009  . DEPRESSION 11/03/2009  . DIABETES MELLITUS, TYPE II 11/03/2009  . [place] DISEASE, LUMBAR 11/03/2009  . EMPHYSEMA, BULLOUS 11/03/2009  . GERD 11/03/2009  . HYPERLIPIDEMIA 11/03/2009  . HYPERTENSION 11/03/2009  . Kidney stones 01/30/12   "I've had them 7 times; always have passed them"  . PEPTIC ULCER DISEASE 11/03/2009  . Pneumonia   . RASH-NONVESICULAR 11/03/2009  . RESTLESS LEG SYNDROME 11/03/2009  . Shortness of breath    "sometimes; at any time"  . SPINAL STENOSIS, LUMBAR 11/03/2009    HOSPITAL COURSE:  1. COPD exacerbation Resolved Treated with IV Solu-Medrol, started on prednisone taper, BTs prn, empiric azithromycin, and pt did well  2. History of orthostatic hypotension w/fall Physical therapy did see patient while in house-for rolling walker status post discharge, patient ambulating with physical therapy without difficulty  3. Type 2 diabetes mellitus Stable on current regiment  4.History of colon cancer s/p surgery Stable  5. Tobacco abuse Stable Continue smoking cessation counseling  6. Right ankle/foot swelling/pain Resolved Xray: Small ossification in the region of the plantar fascia may reflect  chronic plantar fasciitis. Will f/u with Dr. Blima Dessert for further evaluation/care.  Will be discharged later today, case management assisting with disposition  DISCHARGE CONDITIONS:   stable  CONSULTS OBTAINED:    DRUG ALLERGIES:  No Known Allergies  DISCHARGE MEDICATIONS:   Allergies as of 05/24/2018   No Known Allergies     Medication List    TAKE these medications   albuterol 108 (90 Base) MCG/ACT inhaler Commonly known as:  PROVENTIL HFA;VENTOLIN HFA Inhale 1-2 puffs into the lungs every 6 (six) hours as needed for wheezing or shortness of breath. What changed:  Another medication with the same name was added. Make sure you understand how and when to take each.   albuterol 108 (90 Base) MCG/ACT inhaler Commonly known as:  PROVENTIL HFA;VENTOLIN HFA Inhale 2 puffs into the lungs every 6 (six) hours as needed. What changed:  You were already taking a medication with the same name, and this prescription was added. Make sure you understand how and when to take each.   aspirin 81 MG chewable tablet Chew 1 tablet daily by mouth.   atorvastatin 40 MG tablet Commonly known as:  LIPITOR Take 1 tablet (40 mg total) by mouth daily.   Fluticasone-Salmeterol 250-50 MCG/DOSE Aepb Commonly known as:  ADVAIR Inhale 1 puff into the lungs 2 (two) times daily.   ipratropium-albuterol 0.5-2.5 (3) MG/3ML Soln Commonly known as:  DUONEB Take 3 mLs by nebulization every 6 (six) hours as needed (as needed for shortness of breath).   metFORMIN 500 MG tablet Commonly known as:  GLUCOPHAGE Take 500 mg by mouth 2 (two) times daily with a meal.  midodrine 5 MG tablet Commonly known as:  PROAMATINE TAKE 1 TABLET BY MOUTH THREE TIMES A DAY BEFORE MEALS FOR 30 DAYS   nitroGLYCERIN 0.4 MG SL tablet Commonly known as:  NITROSTAT Place 1 tablet as needed under the tongue.   nystatin powder Commonly known as:  MYCOSTATIN/NYSTOP Apply topically 4 (four) times daily.    pantoprazole 40 MG tablet Commonly known as:  PROTONIX TAKE 1 TABLET BY MOUTH EVERY DAY IN THE MORNING BEFORE BREAKFAST FOR 30 DAYS   predniSONE 50 MG tablet Commonly known as:  DELTASONE 1 daily Start taking on:  05/25/2018 What changed:    medication strength  how much to take  how to take this  when to take this  additional instructions   tamsulosin 0.4 MG Caps capsule Commonly known as:  FLOMAX Take 0.4 mg daily by mouth.        DISCHARGE INSTRUCTIONS:   If you experience worsening of your admission symptoms, develop shortness of breath, life threatening emergency, suicidal or homicidal thoughts you must seek medical attention immediately by calling 911 or calling your MD immediately  if symptoms less severe.  You Must read complete instructions/literature along with all the possible adverse reactions/side effects for all the Medicines you take and that have been prescribed to you. Take any new Medicines after you have completely understood and accept all the possible adverse reactions/side effects.   Please note  You were cared for by a hospitalist during your hospital stay. If you have any questions about your discharge medications or the care you received while you were in the hospital after you are discharged, you can call the unit and asked to speak with the hospitalist on call if the hospitalist that took care of you is not available. Once you are discharged, your primary care physician will handle any further medical issues. Please note that NO REFILLS for any discharge medications will be authorized once you are discharged, as it is imperative that you return to your primary care physician (or establish a relationship with a primary care physician if you do not have one) for your aftercare needs so that they can reassess your need for medications and monitor your lab values.    Today   CHIEF COMPLAINT:   Chief Complaint  Patient presents with  . Shortness of  Breath    HISTORY OF PRESENT ILLNESS:  84 y.o. male with a known history of COPD presents with shortness of breath going on since yesterday.  He stated it worsened yesterday afternoon and last night.  He was eating at a [employer] and became short of breath.  He was walking and then had a fall in the grass.  Somebody helped him up and got him to his car and he called 911 for further evaluation.  In the ER they were initially watching him to go home the patient still had persistent shortness of breath and hospitalist services were contacted for further evaluation.  VITAL SIGNS:  Blood pressure 130/76, pulse 96, temperature 98 F (36.7 C), temperature source Oral, resp. rate 18, height 5\' 11"  (1.803 m), weight 77.3 kg, SpO2 93 %.  I/O:    Intake/Output Summary (Last 24 hours) at 05/24/2018 1328 Last data filed at 05/23/2018 1400 Gross per 24 hour  Intake 240 ml  Output -  Net 240 ml    PHYSICAL EXAMINATION:  GENERAL:  84 y.o.-year-old patient lying in the bed with no acute distress.  EYES: Pupils equal, round, reactive to light and  accommodation. No scleral icterus. Extraocular muscles intact.  HEENT: Head atraumatic, normocephalic. Oropharynx and nasopharynx clear.  NECK:  Supple, no jugular venous distention. No thyroid enlargement, no tenderness.  LUNGS: Normal breath sounds bilaterally, no wheezing, rales,rhonchi or crepitation. No use of accessory muscles of respiration.  CARDIOVASCULAR: S1, S2 normal. No murmurs, rubs, or gallops.  ABDOMEN: Soft, non-tender, non-distended. Bowel sounds present. No organomegaly or mass.  EXTREMITIES: No pedal edema, cyanosis, or clubbing.  NEUROLOGIC: Cranial nerves II through XII are intact. Muscle strength 5/5 in all extremities. Sensation intact. Gait not checked.  PSYCHIATRIC: The patient is alert and oriented x 3.  SKIN: No obvious rash, lesion, or ulcer.   DATA REVIEW:   CBC Recent Labs  Lab 05/21/18 0528  WBC 9.7  HGB 12.5*  HCT 36.1*   PLT 147*    Chemistries  Recent Labs  Lab 05/21/18 0528  NA 141  K 3.8  CL 106  CO2 30  GLUCOSE 147*  BUN 22  CREATININE 0.98  CALCIUM 9.0    Cardiac Enzymes Recent Labs  Lab 05/20/18 0152  TROPONINI <0.03    Microbiology Results  Results for orders placed or performed during the hospital encounter of 02/21/17  Blood culture (routine x 2)     Status: Abnormal   Collection Time: 02/21/17  8:05 AM  Result Value Ref Range Status   Specimen Description BLOOD L HAND  Final   Special Requests   Final    BOTTLES DRAWN AEROBIC AND ANAEROBIC Blood Culture adequate volume   Culture  Setup Time   Final    GRAM POSITIVE COCCI ANAEROBIC BOTTLE ONLY CRITICAL RESULT CALLED TO, READ BACK BY AND VERIFIED WITH: CHRISTINE KATSOUDAS 02/22/17 0737 SGD    Culture (A)  Final    STAPHYLOCOCCUS SPECIES (COAGULASE NEGATIVE) THE SIGNIFICANCE OF ISOLATING THIS ORGANISM FROM A SINGLE SET OF BLOOD CULTURES WHEN MULTIPLE SETS ARE DRAWN IS UNCERTAIN. PLEASE NOTIFY THE MICROBIOLOGY DEPARTMENT WITHIN ONE WEEK IF SPECIATION AND SENSITIVITIES ARE REQUIRED. Performed at [facility], [place] [address] [place], [place] [address]    Report Status 02/24/2017 FINAL  Final  Blood culture (routine x 2)     Status: None   Collection Time: 02/21/17  8:05 AM  Result Value Ref Range Status   Specimen Description BLOOD R ARM  Final   Special Requests   Final    BOTTLES DRAWN AEROBIC AND ANAEROBIC Blood Culture results may not be optimal due to an excessive volume of blood received in culture bottles   Culture NO GROWTH 5 DAYS  Final   Report Status 02/26/2017 FINAL  Final  Blood Culture ID Panel (Reflexed)     Status: Abnormal   Collection Time: 02/21/17  8:05 AM  Result Value Ref Range Status   Enterococcus species NOT DETECTED NOT DETECTED Final   Listeria monocytogenes NOT DETECTED NOT DETECTED Final   Staphylococcus species DETECTED (A) NOT DETECTED Final    Comment: Methicillin (oxacillin)  susceptible coagulase negative staphylococcus. Possible blood culture contaminant (unless isolated from more than one blood culture draw or clinical case suggests pathogenicity). No antibiotic treatment is indicated for blood  culture contaminants. CRITICAL RESULT CALLED TO, READ BACK BY AND VERIFIED WITH: CHRISTINE KATSOUDAS 02/22/17 0737 SGD    Staphylococcus aureus NOT DETECTED NOT DETECTED Final   Methicillin resistance NOT DETECTED NOT DETECTED Final   Streptococcus species NOT DETECTED NOT DETECTED Final   Streptococcus agalactiae NOT DETECTED NOT DETECTED Final   Streptococcus pneumoniae NOT DETECTED  NOT DETECTED Final   Streptococcus pyogenes NOT DETECTED NOT DETECTED Final   Acinetobacter baumannii NOT DETECTED NOT DETECTED Final   Enterobacteriaceae species NOT DETECTED NOT DETECTED Final   Enterobacter cloacae complex NOT DETECTED NOT DETECTED Final   Escherichia coli NOT DETECTED NOT DETECTED Final   Klebsiella oxytoca NOT DETECTED NOT DETECTED Final   Klebsiella pneumoniae NOT DETECTED NOT DETECTED Final   Proteus species NOT DETECTED NOT DETECTED Final   Serratia marcescens NOT DETECTED NOT DETECTED Final   Haemophilus influenzae NOT DETECTED NOT DETECTED Final   Neisseria meningitidis NOT DETECTED NOT DETECTED Final   Pseudomonas aeruginosa NOT DETECTED NOT DETECTED Final   Candida albicans NOT DETECTED NOT DETECTED Final   Candida glabrata NOT DETECTED NOT DETECTED Final   Candida krusei NOT DETECTED NOT DETECTED Final   Candida parapsilosis NOT DETECTED NOT DETECTED Final   Candida tropicalis NOT DETECTED NOT DETECTED Final  MRSA PCR Screening     Status: None   Collection Time: 02/21/17 10:07 AM  Result Value Ref Range Status   MRSA by PCR NEGATIVE NEGATIVE Final    Comment:        The GeneXpert MRSA Assay (FDA approved for NASAL specimens only), is one component of a comprehensive MRSA colonization surveillance program. It is not intended to diagnose  MRSA infection nor to guide or monitor treatment for MRSA infections.     RADIOLOGY:  No results found.  EKG:   Orders placed or performed during the hospital encounter of 05/20/18  . EKG 12-Lead  . EKG 12-Lead  . EKG      Management plans discussed with the patient, family and they are in agreement.  CODE STATUS:     Code Status Orders  (From admission, onward)         Start     Ordered   05/20/18 1232  Do not attempt resuscitation (DNR)  Continuous    Question Answer Comment  In the event of cardiac or respiratory ARREST Do not call a "code blue"   In the event of cardiac or respiratory ARREST Do not perform Intubation, CPR, defibrillation or ACLS   In the event of cardiac or respiratory ARREST Use medication by any route, position, wound care, and other measures to relive pain and suffering. May use oxygen, suction and manual treatment of airway obstruction as needed for comfort.   Comments nurse may pronounce      05/20/18 1232        Code Status History    Date Active Date Inactive Code Status Order ID Comments User Context   03/24/2017 0552 03/26/2017 1600 DNR [id number]  Norval Morton, MD ED   02/21/2017 0908 02/21/2017 1510 DNR [id number]  Dustin Flock, MD Inpatient   01/15/2017 2243 01/17/2017 1512 Full Code [id number]  Saundra Shelling, MD ED   12/15/2016 2054 12/16/2016 2121 Full Code [id number]  Dustin Flock, MD Inpatient   12/15/2016 1840 12/15/2016 2054 DNR [id number]  Dustin Flock, MD ED   November 07, 202018 2122 11/02/2016 1740 DNR [id number]  Loletha Grayer, MD ED   03/17/2012 0542 03/19/2012 2014 Full Code [id number]  Capes, Julieta Gutting, RN Inpatient   01/30/2012 1656 02/03/2012 1859 Full Code [id number]  Murlean Iba, MD Inpatient   08/11/2011 1803 08/13/2011 1835 Full Code [id number]  Loyal Gambler, RN Inpatient      TOTAL TIME TAKING CARE OF THIS PATIENT: 45 minutes.    Avel Peace Deriona Altemose M.D on 05/24/2018 at 1:28 PM  Between 7am to  6pm - Pager -  [phone]  After 6pm go to [email or web address] - password [email or web address] [facility]  Office  [phone]  CC: Primary care physician; System, Pcp Not In   Note: This dictation was prepared with Dragon dictation along with smaller phrase technology. Any transcriptional errors that result from this process are unintentional.

## 2018-05-24 NOTE — Progress Notes (Signed)
Discharge instructions given and went over with patient at bedside. Prescriptions given and reviewed. All questions answered. Patient discharged via wheelchair by volunteer services. See SW note. Madlyn Frankel, RN

## 2018-06-06 ENCOUNTER — Other Ambulatory Visit: Payer: Self-pay

## 2018-06-06 ENCOUNTER — Encounter (HOSPITAL_COMMUNITY): Payer: Self-pay

## 2018-06-06 ENCOUNTER — Emergency Department (HOSPITAL_COMMUNITY)
Admission: EM | Admit: 2018-06-06 | Discharge: 2018-06-07 | Disposition: A | Discharge status: Home / Self Care | Payer: Medicare (Managed Care) | Attending: Emergency Medicine | Admitting: Emergency Medicine

## 2018-06-06 ENCOUNTER — Emergency Department (HOSPITAL_COMMUNITY): Payer: Medicare (Managed Care)

## 2018-06-06 DIAGNOSIS — F17210 Nicotine dependence, cigarettes, uncomplicated: Secondary | ICD-10-CM | POA: Diagnosis not present

## 2018-06-06 DIAGNOSIS — Z85038 Personal history of other malignant neoplasm of large intestine: Secondary | ICD-10-CM | POA: Insufficient documentation

## 2018-06-06 DIAGNOSIS — I10 Essential (primary) hypertension: Secondary | ICD-10-CM | POA: Diagnosis not present

## 2018-06-06 DIAGNOSIS — E119 Type 2 diabetes mellitus without complications: Secondary | ICD-10-CM | POA: Diagnosis not present

## 2018-06-06 DIAGNOSIS — Z7982 Long term (current) use of aspirin: Secondary | ICD-10-CM | POA: Diagnosis not present

## 2018-06-06 DIAGNOSIS — R0602 Shortness of breath: Secondary | ICD-10-CM | POA: Diagnosis present

## 2018-06-06 DIAGNOSIS — J441 Chronic obstructive pulmonary disease with (acute) exacerbation: Secondary | ICD-10-CM

## 2018-06-06 DIAGNOSIS — Z79899 Other long term (current) drug therapy: Secondary | ICD-10-CM | POA: Diagnosis not present

## 2018-06-06 DIAGNOSIS — Z7984 Long term (current) use of oral hypoglycemic drugs: Secondary | ICD-10-CM | POA: Insufficient documentation

## 2018-06-06 LAB — BASIC METABOLIC PANEL
ANION GAP: 9 (ref 5–15)
BUN: 18 mg/dL (ref 8–23)
CO2: 28 mmol/L (ref 22–32)
Calcium: 8.9 mg/dL (ref 8.9–10.3)
Chloride: 107 mmol/L (ref 98–111)
Creatinine, Ser: 0.95 mg/dL (ref 0.61–1.24)
GFR calc Af Amer: >60 mL/min (ref >60)
GLUCOSE: 103 mg/dL — AB (ref 70–99)
POTASSIUM: 3.7 mmol/L (ref 3.5–5.1)
Sodium: 144 mmol/L (ref 135–145)

## 2018-06-06 LAB — I-STAT TROPONIN, ED: TROPONIN I, POC: 0 ng/mL (ref 0.00–0.08)

## 2018-06-06 LAB — CBC
HCT: 41.7 % (ref 39.0–52.0)
Hemoglobin: 13.4 g/dL (ref 13.0–17.0)
MCH: 28.9 pg (ref 26.0–34.0)
MCHC: 32.1 g/dL (ref 30.0–36.0)
MCV: 90.1 fL (ref 78.0–100.0)
PLATELETS: 183 10*3/uL (ref 150–400)
RBC: 4.63 MIL/uL (ref 4.22–5.81)
RDW: 17 % — AB (ref 11.5–15.5)
WBC: 9.1 10*3/uL (ref 4.0–10.5)

## 2018-06-06 MED ORDER — PREDNISONE 20 MG PO TABS
40.0000 mg | ORAL_TABLET | Freq: Once | ORAL | Status: AC
  Administered 2018-06-06: 40 mg via ORAL
  Filled 2018-06-06: qty 2

## 2018-06-06 MED ORDER — IPRATROPIUM-ALBUTEROL 0.5-2.5 (3) MG/3ML IN SOLN
3.0000 mL | Freq: Once | RESPIRATORY_TRACT | Status: AC
  Administered 2018-06-06: 3 mL via RESPIRATORY_TRACT
  Filled 2018-06-06: qty 3

## 2018-06-06 MED ORDER — DOXYCYCLINE HYCLATE 100 MG PO TABS
100.0000 mg | ORAL_TABLET | Freq: Once | ORAL | Status: AC
  Administered 2018-06-06: 100 mg via ORAL
  Filled 2018-06-06: qty 1

## 2018-06-06 NOTE — Progress Notes (Signed)
CSW met with pt and offered and pt accepted resources for the [facility], and was counseled on the [facility]'s ability to meet pt nursing needs/med needs and assist pt with possible placement through the [facility] program, pt was given the name of the [employer] fast streamer.  CSW also provided pt with contact info/address for the [employer] for free breakfast and lunch as well as for the [employer] and the [employer] and was counseled on how to seek admisson.  Pt stated he hasd no other needs at this time and asked if he would be D/C'd.  CSW stated he would find out and update the EDP.  Please reconsult if future social work needs arise.  CSW signing off, as social work intervention is no longer needed.  Alphonse Guild. Dorathy Stallone, LCSW, LCAS, CSI Clinical Social Worker Ph: [phone]

## 2018-06-06 NOTE — ED Triage Notes (Signed)
Pt states that he has been weak for several days. Pt c/o chest pain and shortness of breath, as well as edema on his right side of his body.

## 2018-06-06 NOTE — ED Provider Notes (Signed)
[place] DEPT Provider Note   CSN: [id number] Arrival date & time: 06/06/18  1908     History   Chief Complaint Chief Complaint  Patient presents with  . Weakness    HPI [name] [name] is a 84 y.o. male.  The history is provided by the patient. No language interpreter was used.  Weakness    [name] [name] is a 84 y.o. male who presents to the Emergency Department complaining of sob. He presents to the emergency department complaining of shortness of breath and cough. He reports 2 to 3 days of cough with shortness of breath and dyspnea on exertion. He denies any hemoptysis. He does endorse some central chest pain that is worse with breathing and coughing for the last few days. He also endorses bilateral lower extremity edema that is been present for the last few days. He denies any fevers, abdominal pain, nausea, vomiting, dysuria. He has a history of COPD and recently moved to the area from [facility]. He is not currently established with a PCP and is living in his car and motels. He was admitted to the hospital a week ago and discharged home. He states that he has been compliant with his medications. He does endorse feeling upset lately over his poor health and has been smoking cigarettes for the last two days. Past Medical History:  Diagnosis Date  . ABSCESS 12/03/2009  . ABSCESS, FINGER 04/07/2010  . ANXIETY 11/03/2009  . ASTHMA 11/03/2009  . CHRONIC OBSTRUCTIVE PULMONARY DISEASE, ACUTE EXACERBATION 11/03/2009  . Colon cancer ([place])   . COPD 11/03/2009  . DEPRESSION 11/03/2009  . DIABETES MELLITUS, TYPE II 11/03/2009  . [place] DISEASE, LUMBAR 11/03/2009  . EMPHYSEMA, BULLOUS 11/03/2009  . GERD 11/03/2009  . HYPERLIPIDEMIA 11/03/2009  . HYPERTENSION 11/03/2009  . Kidney stones 01/30/12   "I've had them 7 times; always have passed them"  . PEPTIC ULCER DISEASE 11/03/2009  . Pneumonia   . RASH-NONVESICULAR 11/03/2009  . RESTLESS LEG SYNDROME 11/03/2009  .  Shortness of breath    "sometimes; at any time"  . SPINAL STENOSIS, LUMBAR 11/03/2009    Patient Active Problem List   Diagnosis Date Noted  . COPD exacerbation ([place]) 03/24/2017  . Hypoxia 03/24/2017  . Diabetes mellitus type 2 in nonobese ([place]) 03/24/2017  . Polysubstance abuse ([place]) 03/24/2017  . Pressure injury of skin 03/24/2017  . Overdose of benzodiazepine 01/16/2017  . Unresponsiveness   . HCAP (healthcare-associated pneumonia)   . Respiratory failure ([place]) 01/15/2017  . CVA (cerebral vascular accident) ([place]) 12/15/2016  . Chest pain 04-24-202018  . PNA (pneumonia) 03/17/2012  . Weakness generalized 03/17/2012  . Generalized weakness 01/30/2012  . Fall at home 01/30/2012  . Physical deconditioning 01/30/2012  . Nausea vomiting and diarrhea 01/15/2012  . UTI (urinary tract infection) 01/15/2012  . Cocaine abuse ([place]) 08/11/2011  . Tobacco abuse 08/11/2011  . Orthostasis 12/23/2010  . Dehydration 12/23/2010  . Abdominal pain, other specified site 12/23/2010  . Dizziness 12/23/2010  . Weight loss 12/23/2010  . Left lumbar radiculopathy 12/23/2010  . DIABETES MELLITUS, TYPE II 11/03/2009  . HYPERLIPIDEMIA 11/03/2009  . ANXIETY 11/03/2009  . DEPRESSION 11/03/2009  . RESTLESS LEG SYNDROME 11/03/2009  . HYPERTENSION 11/03/2009  . EMPHYSEMA, BULLOUS 11/03/2009  . ASTHMA 11/03/2009  . COPD 11/03/2009  . GERD 11/03/2009  . PEPTIC ULCER DISEASE 11/03/2009  . [place] DISEASE, LUMBAR 11/03/2009  . SPINAL STENOSIS, LUMBAR 11/03/2009  . NEPHROLITHIASIS, HX OF 11/03/2009    Past  Surgical History:  Procedure Laterality Date  . Colon cancer surgery    . [place]   left  . INGUINAL HERNIA REPAIR  10/2011   left  . ROTATOR CUFF REPAIR  2003   left  . TONSILLECTOMY  1960        Home Medications    Prior to Admission medications   Medication Sig Start Date End Date Taking? Authorizing Provider  albuterol (PROVENTIL HFA;VENTOLIN HFA) 108 (90 Base)  MCG/ACT inhaler Inhale 2 puffs into the lungs every 6 (six) hours as needed. 05/24/18  Yes Salary, Avel Peace, MD  aspirin 81 MG chewable tablet Chew 1 tablet daily by mouth. 12/05/12  Yes [provider]  atorvastatin (LIPITOR) 40 MG tablet Take 1 tablet (40 mg total) by mouth daily. 12/16/16  Yes Mody, Ulice Bold, MD  guaiFENesin-dextromethorphan (ROBITUSSIN DM) 100-10 MG/5ML syrup Take 5 mLs by mouth 2 (two) times daily as needed for cough. And congestion 06/01/18 06/11/18 Yes [provider]  ipratropium-albuterol (DUONEB) 0.5-2.5 (3) MG/3ML SOLN Take 3 mLs by nebulization every 6 (six) hours as needed (as needed for shortness of breath). 03/26/17  Yes Arrien, Jimmy Picket, MD  metFORMIN (GLUCOPHAGE) 500 MG tablet Take 500 mg by mouth 2 (two) times daily with a meal.   Yes [provider]  metoprolol tartrate (LOPRESSOR) 25 MG tablet Take 25 mg by mouth 2 (two) times daily. 12/18/17 06/10/18 Yes [provider]  midodrine (PROAMATINE) 5 MG tablet Take 5 mg by mouth 3 (three) times daily with meals.  05/11/18  Yes [provider]  nystatin (MYCOSTATIN/NYSTOP) powder Apply topically 4 (four) times daily. 05/20/18  Yes Loney Hering, MD  tamsulosin (FLOMAX) 0.4 MG CAPS capsule Take 0.4 mg daily by mouth.   Yes [provider]  doxycycline (VIBRAMYCIN) 100 MG capsule Take 1 capsule (100 mg total) by mouth 2 (two) times daily. 06/07/18   Quintella Reichert, MD  Fluticasone-Salmeterol (ADVAIR) 250-50 MCG/DOSE AEPB Inhale 1 puff into the lungs 2 (two) times daily. 06/07/18   Quintella Reichert, MD  nitroGLYCERIN (NITROSTAT) 0.4 MG SL tablet Place 1 tablet as needed under the tongue. 04/16/17   [provider]  predniSONE (DELTASONE) 10 MG tablet Take 4 tablets (40 mg total) by mouth daily. 06/07/18   Quintella Reichert, MD    Family History Family History  Problem Relation Age of Onset  . Heart disease Father   . Heart disease Mother   . Cancer Brother         lung    Social History Social History   Tobacco Use  . Smoking status: Current Every Day Smoker    Packs/day: 1.00    Years: 41.00    Pack years: 41.00    Types: Cigarettes  . Smokeless tobacco: Never Used  . Tobacco comment: "stopped smoking 04/21/1991 then restarted in 2012"  Substance Use Topics  . Alcohol use: No  . Drug use: No     Allergies   Patient has no known allergies.   Review of Systems Review of Systems  Neurological: Positive for weakness.  All other systems reviewed and are negative.    Physical Exam Updated Vital Signs BP 117/67   Pulse 88   Temp 97.7 F (36.5 C) (Oral)   Resp 16   Ht '5\' 11"'  (1.803 m)   Wt 77.3 kg   SpO2 (!) 86%   BMI 23.77 kg/m   Physical Exam  Constitutional: He is oriented to person, place, and  time. He appears well-developed and well-nourished.  HENT:  Head: Normocephalic and atraumatic.  Cardiovascular: Normal rate and regular rhythm.  No murmur heard. Pulmonary/Chest: Effort normal. No respiratory distress.  And expiratory wheezes bilaterally. No accessory muscle use.  Abdominal: Soft. There is no tenderness. There is no rebound and no guarding.  Musculoskeletal: He exhibits no tenderness.  One plus pitting edema to bilateral lower extremities  Neurological: He is alert and oriented to person, place, and time.  Skin: Skin is warm and dry.  Psychiatric: He has a normal mood and affect. His behavior is normal.  Nursing note and vitals reviewed.    ED Treatments / Results  Labs (all labs ordered are listed, but only abnormal results are displayed) Labs Reviewed  BASIC METABOLIC PANEL - Abnormal; Notable for the following components:      Result Value   Glucose, Bld 103 (*)    All other components within normal limits  CBC - Abnormal; Notable for the following components:   RDW 17.0 (*)    All other components within normal limits  URINALYSIS, ROUTINE W REFLEX MICROSCOPIC - Abnormal; Notable for the following  components:   Leukocytes, UA SMALL (*)    Bacteria, UA RARE (*)    All other components within normal limits  I-STAT TROPONIN, ED    EKG EKG Interpretation  Date/Time:  Wednesday June 06 2018 19:20:46 EDT Ventricular Rate:  76 PR Interval:    QRS Duration: 93 QT Interval:  385 QTC Calculation: 433 R Axis:   73 Text Interpretation:  Sinus rhythm No significant change since last tracing Confirmed by Quintella Reichert [phone]) on 06/06/2018 8:48:09 PM   Radiology Dg Chest 2 View  Result Date: 06/06/2018 CLINICAL DATA:  Shortness of breath EXAM: CHEST - 2 VIEW COMPARISON:  CT 05/29/2018, radiograph 05/29/2018, 05/20/2018, 05/12/2018 FINDINGS: Hyperinflation with marked left greater than right emphysematous disease. Chronic interstitial opacity at the bases. More confluent opacity in the left lung base. No pleural effusion. No pneumothorax. IMPRESSION: Marked emphysematous disease with chronic interstitial changes. Suspected acute focal airspace disease at the left base which may reflect a pneumonia. Electronically Signed   By: Donavan Foil M.D.   On: 06/06/2018 21:59    Procedures Procedures (including critical care time)  Medications Ordered in ED Medications  ipratropium-albuterol (DUONEB) 0.5-2.5 (3) MG/3ML nebulizer solution 3 mL (3 mLs Nebulization Given 06/06/18 2134)  predniSONE (DELTASONE) tablet 40 mg (40 mg Oral Given 06/06/18 2134)  doxycycline (VIBRA-TABS) tablet 100 mg (100 mg Oral Given 06/06/18 2252)     Initial Impression / Assessment and Plan / ED Course  I have reviewed the triage vital signs and the nursing notes.  Pertinent labs & imaging results that were available during my care of the patient were reviewed by me and considered in my medical decision making (see chart for details).    Patient here for evaluation of shortness of breath, history of COPD. He is non-toxic appearing on examination with no respiratory distress. No concerning features for hyper  karmic respiratory failure. Patient initially states that he is only been one hospital in the last week and that was [facility]. On record review it appears that he had a recent admission to [facility] as well where he had an extensive workup with D dimer, CTA and echocardiogram. When discussing these findings with the patient he does admit to going to [facility] and states that his pain symptoms have been ongoing  for greater than a month.   Patient with no respiratory distress at rest in setting 96 to 98% on room air. He does have hypoxia on ambulation but this is without supplemental oxygen. He states that he has oxygen available with an oxygen concentrator in his vehicle. Chest x-ray with questionable pneumonia, in setting of recent hospitalization will treat with antibiotics. Discussed with patient home care for COPD, discontinuing tobacco use. Discussed using his inhalers. He states he has in his inhalers available except for Advair, which will be refilled. He met with social work with outpatient resources provided. He recently was offered rehab and declined and he declines again today. Plan to discharge home with outpatient follow-up. Final Clinical Impressions(s) / ED Diagnoses   Final diagnoses:  COPD exacerbation [facility])    ED Discharge Orders         Ordered    Fluticasone-Salmeterol (ADVAIR) 250-50 MCG/DOSE AEPB  2 times daily     06/07/18 0011    predniSONE (DELTASONE) 10 MG tablet  Daily     06/07/18 0011    doxycycline (VIBRAMYCIN) 100 MG capsule  2 times daily     06/07/18 0011           Quintella Reichert, MD 06/07/18 0040

## 2018-06-06 NOTE — Progress Notes (Signed)
Consult request has been received. CSW attempting to follow up at present time.  CSW reviewed chart and notes ED CSW's note from [facility] ED CSW on 05/20/18:  "LCSW assessed patient for competency and completed full assessment. He has come to [place] to meet with his son and has plans to find apartment or rent room in boarding house. LCSW has provided patient with [place], [place]. Patient had a good visit with his son Jerrye Beavers last Thursday for a couple of hours and understands it will take time to rebuild his relationship with his son.   LCSW found patient to be competent and independent and has no abuse issues regarding his son and was very upset to hear that APS was called by the nurse. LCSW explained that if he sleeps over in rest areas and not in proper housing others may feel he is in distress. He agrees to find a hotel. He received SSI totaling 1200.00 which is more than enough for housing and gas and food needs as per patient.  LCSW consulted with Gwyndolyn Saxon ED charge nurse/EDP and ED RN and explained APS- situation and that this worker will call APS and explain patients situation. LCSW called and spoke to [facility] for APS DSS and explained situation and she agreed this is not a protection issue or Elder abuse issue. They have cancelled APS report."  CSW will continue to follow for D/C needs.  Alphonse Guild. Ronell Duffus, LCSW, LCAS, CSI Clinical Social Worker Ph: [phone]     '

## 2018-06-06 NOTE — ED Notes (Signed)
Pt unable to urinate at this time.  

## 2018-06-06 NOTE — ED Notes (Signed)
Pt refusing to ambulate at this time

## 2018-06-07 LAB — URINALYSIS, ROUTINE W REFLEX MICROSCOPIC
BILIRUBIN URINE: NEGATIVE
GLUCOSE, UA: NEGATIVE mg/dL
HGB URINE DIPSTICK: NEGATIVE
KETONES UR: NEGATIVE mg/dL
NITRITE: NEGATIVE
PROTEIN: NEGATIVE mg/dL
Specific Gravity, Urine: 1.015 (ref 1.005–1.030)
pH: 6 (ref 5.0–8.0)

## 2018-06-07 MED ORDER — DOXYCYCLINE HYCLATE 100 MG PO CAPS: 100.0000 mg | ORAL_CAPSULE | Freq: Two times a day (BID) | ORAL | 0 refills | Status: DC

## 2018-06-07 MED ORDER — PREDNISONE 10 MG PO TABS: 40.0000 mg | ORAL_TABLET | Freq: Every day | ORAL | 0 refills | Status: DC

## 2018-06-07 MED ORDER — FLUTICASONE-SALMETEROL 250-50 MCG/DOSE IN AEPB: 1.0000 | INHALATION_SPRAY | Freq: Two times a day (BID) | RESPIRATORY_TRACT | 0 refills | Status: DC

## 2018-06-07 NOTE — ED Notes (Signed)
MD aware of gait and oxygen saturation drop

## 2018-06-07 NOTE — Discharge Instructions (Addendum)
Call to follow up with your family doctor.  Get rechecked immediately if you have any new or concerning symptoms.

## 2018-06-07 NOTE — ED Notes (Signed)
Pt ambulated to the restroom with this nurse and tech [place], pt had increased respirations and oxygen saturation decreased to 84% pt had an unsteady gait.

## 2018-06-17 ENCOUNTER — Inpatient Hospital Stay
Admission: EM | Admit: 2018-06-17 | Discharge: 2018-06-20 | DRG: 291 | Disposition: A | Discharge status: Home / Self Care | Payer: Medicare (Managed Care) | Attending: Internal Medicine | Admitting: Internal Medicine

## 2018-06-17 ENCOUNTER — Other Ambulatory Visit: Payer: Self-pay

## 2018-06-17 ENCOUNTER — Emergency Department: Payer: Medicare (Managed Care)

## 2018-06-17 ENCOUNTER — Encounter: Payer: Self-pay | Admitting: Emergency Medicine

## 2018-06-17 DIAGNOSIS — Z590 Homelessness: Secondary | ICD-10-CM | POA: Diagnosis not present

## 2018-06-17 DIAGNOSIS — Z7984 Long term (current) use of oral hypoglycemic drugs: Secondary | ICD-10-CM

## 2018-06-17 DIAGNOSIS — J9621 Acute and chronic respiratory failure with hypoxia: Secondary | ICD-10-CM | POA: Diagnosis present

## 2018-06-17 DIAGNOSIS — I951 Orthostatic hypotension: Secondary | ICD-10-CM | POA: Diagnosis present

## 2018-06-17 DIAGNOSIS — R000 Tachycardia, unspecified: Secondary | ICD-10-CM | POA: Diagnosis present

## 2018-06-17 DIAGNOSIS — I5033 Acute on chronic diastolic (congestive) heart failure: Secondary | ICD-10-CM | POA: Diagnosis present

## 2018-06-17 DIAGNOSIS — E119 Type 2 diabetes mellitus without complications: Secondary | ICD-10-CM | POA: Diagnosis present

## 2018-06-17 DIAGNOSIS — I110 Hypertensive heart disease with heart failure: Secondary | ICD-10-CM | POA: Diagnosis present

## 2018-06-17 DIAGNOSIS — Z7951 Long term (current) use of inhaled steroids: Secondary | ICD-10-CM | POA: Diagnosis not present

## 2018-06-17 DIAGNOSIS — N400 Enlarged prostate without lower urinary tract symptoms: Secondary | ICD-10-CM | POA: Diagnosis present

## 2018-06-17 DIAGNOSIS — Z515 Encounter for palliative care: Secondary | ICD-10-CM | POA: Diagnosis not present

## 2018-06-17 DIAGNOSIS — F17210 Nicotine dependence, cigarettes, uncomplicated: Secondary | ICD-10-CM | POA: Diagnosis present

## 2018-06-17 DIAGNOSIS — R269 Unspecified abnormalities of gait and mobility: Secondary | ICD-10-CM

## 2018-06-17 DIAGNOSIS — Z9981 Dependence on supplemental oxygen: Secondary | ICD-10-CM

## 2018-06-17 DIAGNOSIS — I509 Heart failure, unspecified: Secondary | ICD-10-CM | POA: Diagnosis not present

## 2018-06-17 DIAGNOSIS — Z66 Do not resuscitate: Secondary | ICD-10-CM

## 2018-06-17 DIAGNOSIS — R609 Edema, unspecified: Secondary | ICD-10-CM

## 2018-06-17 DIAGNOSIS — Z7982 Long term (current) use of aspirin: Secondary | ICD-10-CM | POA: Diagnosis not present

## 2018-06-17 DIAGNOSIS — K219 Gastro-esophageal reflux disease without esophagitis: Secondary | ICD-10-CM | POA: Diagnosis present

## 2018-06-17 DIAGNOSIS — E785 Hyperlipidemia, unspecified: Secondary | ICD-10-CM | POA: Diagnosis present

## 2018-06-17 DIAGNOSIS — J441 Chronic obstructive pulmonary disease with (acute) exacerbation: Secondary | ICD-10-CM | POA: Diagnosis not present

## 2018-06-17 DIAGNOSIS — R55 Syncope and collapse: Secondary | ICD-10-CM

## 2018-06-17 DIAGNOSIS — Z79899 Other long term (current) drug therapy: Secondary | ICD-10-CM | POA: Diagnosis not present

## 2018-06-17 DIAGNOSIS — I5030 Unspecified diastolic (congestive) heart failure: Secondary | ICD-10-CM | POA: Diagnosis not present

## 2018-06-17 LAB — GLUCOSE, CAPILLARY: Glucose-Capillary: 151 mg/dL — ABNORMAL HIGH (ref 70–99)

## 2018-06-17 LAB — COMPREHENSIVE METABOLIC PANEL
ALBUMIN: 3.2 g/dL — AB (ref 3.5–5.0)
ALT: 14 U/L (ref 0–44)
ANION GAP: 7 (ref 5–15)
AST: 16 U/L (ref 15–41)
Alkaline Phosphatase: 50 U/L (ref 38–126)
BUN: 19 mg/dL (ref 8–23)
CO2: 28 mmol/L (ref 22–32)
Calcium: 8.8 mg/dL — ABNORMAL LOW (ref 8.9–10.3)
Chloride: 106 mmol/L (ref 98–111)
Creatinine, Ser: 0.96 mg/dL (ref 0.61–1.24)
GFR calc Af Amer: >60 mL/min (ref >60)
GFR calc non Af Amer: >60 mL/min (ref >60)
GLUCOSE: 102 mg/dL — AB (ref 70–99)
POTASSIUM: 3.6 mmol/L (ref 3.5–5.1)
SODIUM: 141 mmol/L (ref 135–145)
TOTAL PROTEIN: 5.4 g/dL — AB (ref 6.5–8.1)
Total Bilirubin: 1.1 mg/dL (ref 0.3–1.2)

## 2018-06-17 LAB — CBC WITH DIFFERENTIAL/PLATELET
BASOS ABS: 0 10*3/uL (ref 0–0.1)
Basophils Relative: 0 %
EOS PCT: 1 %
Eosinophils Absolute: 0.1 10*3/uL (ref 0–0.7)
HEMATOCRIT: 34.4 % — AB (ref 40.0–52.0)
Hemoglobin: 12.2 g/dL — ABNORMAL LOW (ref 13.0–18.0)
LYMPHS ABS: 1.1 10*3/uL (ref 1.0–3.6)
Lymphocytes Relative: 12 %
MCH: 30.9 pg (ref 26.0–34.0)
MCHC: 35.6 g/dL (ref 32.0–36.0)
MCV: 86.9 fL (ref 80.0–100.0)
MONO ABS: 0.4 10*3/uL (ref 0.2–1.0)
MONOS PCT: 5 %
NEUTROS ABS: 7.8 10*3/uL — AB (ref 1.4–6.5)
Neutrophils Relative %: 82 %
PLATELETS: 199 10*3/uL (ref 150–440)
RBC: 3.95 MIL/uL — ABNORMAL LOW (ref 4.40–5.90)
RDW: 17.7 % — AB (ref 11.5–14.5)
WBC: 9.5 10*3/uL (ref 3.8–10.6)

## 2018-06-17 LAB — LACTIC ACID, PLASMA
Lactic Acid, Venous: 2.1 mmol/L (ref 0.5–1.9)
Lactic Acid, Venous: 1.4 mmol/L (ref 0.5–1.9)

## 2018-06-17 LAB — TROPONIN I

## 2018-06-17 LAB — CBC
HCT: 37.2 % — ABNORMAL LOW (ref 40.0–52.0)
Hemoglobin: 12.6 g/dL — ABNORMAL LOW (ref 13.0–18.0)
MCH: 30 pg (ref 26.0–34.0)
MCHC: 33.8 g/dL (ref 32.0–36.0)
MCV: 88.8 fL (ref 80.0–100.0)
PLATELETS: 196 10*3/uL (ref 150–440)
RBC: 4.19 MIL/uL — ABNORMAL LOW (ref 4.40–5.90)
RDW: 18.3 % — ABNORMAL HIGH (ref 11.5–14.5)
WBC: 8.6 10*3/uL (ref 3.8–10.6)

## 2018-06-17 LAB — BRAIN NATRIURETIC PEPTIDE: B Natriuretic Peptide: 65 pg/mL (ref 0.0–100.0)

## 2018-06-17 LAB — CREATININE, SERUM
CREATININE: 1.03 mg/dL (ref 0.61–1.24)
GFR calc Af Amer: >60 mL/min (ref >60)
GFR calc non Af Amer: >60 mL/min (ref >60)

## 2018-06-17 MED ORDER — ALBUTEROL SULFATE (2.5 MG/3ML) 0.083% IN NEBU
2.5000 mg | INHALATION_SOLUTION | Freq: Once | RESPIRATORY_TRACT | Status: AC
  Administered 2018-06-17: 2.5 mg via RESPIRATORY_TRACT
  Filled 2018-06-17: qty 3

## 2018-06-17 MED ORDER — MIDODRINE HCL 5 MG PO TABS
5.0000 mg | ORAL_TABLET | Freq: Three times a day (TID) | ORAL | Status: DC
  Administered 2018-06-18 – 2018-06-20 (×3): 5 mg via ORAL
  Filled 2018-06-17 (×9): qty 1

## 2018-06-17 MED ORDER — ONDANSETRON HCL 4 MG PO TABS: 4.0000 mg | ORAL_TABLET | Freq: Four times a day (QID) | ORAL | Status: DC | PRN

## 2018-06-17 MED ORDER — INSULIN ASPART 100 UNIT/ML ~~LOC~~ SOLN
0.0000 [IU] | Freq: Three times a day (TID) | SUBCUTANEOUS | Status: DC
  Administered 2018-06-18: 13:00:00 1 [IU] via SUBCUTANEOUS
  Administered 2018-06-18: 3 [IU] via SUBCUTANEOUS
  Administered 2018-06-19: 1 [IU] via SUBCUTANEOUS
  Administered 2018-06-19: 18:00:00 3 [IU] via SUBCUTANEOUS
  Administered 2018-06-20: 13:00:00 2 [IU] via SUBCUTANEOUS
  Filled 2018-06-17 (×5): qty 1

## 2018-06-17 MED ORDER — NICOTINE 14 MG/24HR TD PT24
14.0000 mg | MEDICATED_PATCH | Freq: Every day | TRANSDERMAL | Status: DC
  Administered 2018-06-18 – 2018-06-20 (×3): 14 mg via TRANSDERMAL
  Filled 2018-06-17 (×3): qty 1

## 2018-06-17 MED ORDER — ASPIRIN 81 MG PO CHEW
81.0000 mg | CHEWABLE_TABLET | Freq: Every day | ORAL | Status: DC
  Administered 2018-06-18 – 2018-06-20 (×3): 81 mg via ORAL
  Filled 2018-06-17 (×3): qty 1

## 2018-06-17 MED ORDER — ENOXAPARIN SODIUM 40 MG/0.4ML ~~LOC~~ SOLN
40.0000 mg | SUBCUTANEOUS | Status: DC
  Administered 2018-06-17 – 2018-06-19 (×3): 40 mg via SUBCUTANEOUS
  Filled 2018-06-17 (×3): qty 0.4

## 2018-06-17 MED ORDER — IPRATROPIUM-ALBUTEROL 0.5-2.5 (3) MG/3ML IN SOLN
3.0000 mL | Freq: Four times a day (QID) | RESPIRATORY_TRACT | Status: DC | PRN
  Administered 2018-06-18 – 2018-06-19 (×3): 3 mL via RESPIRATORY_TRACT
  Filled 2018-06-17 (×3): qty 3

## 2018-06-17 MED ORDER — TAMSULOSIN HCL 0.4 MG PO CAPS
0.4000 mg | ORAL_CAPSULE | Freq: Every day | ORAL | Status: DC
  Administered 2018-06-18 – 2018-06-20 (×3): 0.4 mg via ORAL
  Filled 2018-06-17 (×3): qty 1

## 2018-06-17 MED ORDER — INSULIN ASPART 100 UNIT/ML ~~LOC~~ SOLN: 0.0000 [IU] | Freq: Every day | SUBCUTANEOUS | Status: DC

## 2018-06-17 MED ORDER — ATORVASTATIN CALCIUM 20 MG PO TABS
40.0000 mg | ORAL_TABLET | Freq: Every day | ORAL | Status: DC
  Administered 2018-06-18 – 2018-06-20 (×3): 40 mg via ORAL
  Filled 2018-06-17 (×3): qty 2

## 2018-06-17 MED ORDER — ONDANSETRON HCL 4 MG/2ML IJ SOLN: 4.0000 mg | Freq: Four times a day (QID) | INTRAMUSCULAR | Status: DC | PRN

## 2018-06-17 MED ORDER — ACETAMINOPHEN 650 MG RE SUPP: 650.0000 mg | Freq: Four times a day (QID) | RECTAL | Status: DC | PRN

## 2018-06-17 MED ORDER — SODIUM CHLORIDE 0.9 % IV BOLUS
500.0000 mL | Freq: Once | INTRAVENOUS | Status: AC
  Administered 2018-06-17: 500 mL via INTRAVENOUS

## 2018-06-17 MED ORDER — FUROSEMIDE 10 MG/ML IJ SOLN: 40.0000 mg | Freq: Every day | INTRAMUSCULAR | Status: DC

## 2018-06-17 MED ORDER — ACETAMINOPHEN 325 MG PO TABS: 650.0000 mg | ORAL_TABLET | Freq: Four times a day (QID) | ORAL | Status: DC | PRN

## 2018-06-17 MED ORDER — METHYLPREDNISOLONE SODIUM SUCC 125 MG IJ SOLR
60.0000 mg | Freq: Every day | INTRAMUSCULAR | Status: DC
  Administered 2018-06-18 – 2018-06-20 (×3): 60 mg via INTRAVENOUS
  Filled 2018-06-17 (×3): qty 2

## 2018-06-17 MED ORDER — ALBUTEROL SULFATE (2.5 MG/3ML) 0.083% IN NEBU
2.5000 mg | INHALATION_SOLUTION | RESPIRATORY_TRACT | Status: DC | PRN
  Administered 2018-06-18: 2.5 mg via RESPIRATORY_TRACT
  Filled 2018-06-17: qty 3

## 2018-06-17 MED ORDER — POLYETHYLENE GLYCOL 3350 17 G PO PACK: 17.0000 g | PACK | Freq: Every day | ORAL | Status: DC | PRN

## 2018-06-17 MED ORDER — MOMETASONE FURO-FORMOTEROL FUM 200-5 MCG/ACT IN AERO
2.0000 | INHALATION_SPRAY | Freq: Two times a day (BID) | RESPIRATORY_TRACT | Status: DC
  Administered 2018-06-18 – 2018-06-20 (×5): 2 via RESPIRATORY_TRACT
  Filled 2018-06-17: qty 8.8

## 2018-06-17 NOTE — ED Notes (Signed)
Report called to [place]

## 2018-06-17 NOTE — ED Triage Notes (Signed)
Pt was at rest stop and ems witnessed a near syncopal episode. Pt states he is from [place] and per records has been up here and he states he lives in [place] mostly. Alert and oriented.

## 2018-06-17 NOTE — Progress Notes (Signed)
LCSW and several other LCSW different hospitals have met with patient. He is competent and sleeps in motels and hotels and has 1800.00 per month SSI . In the last few visits APS was called but this man is COMPETENT. He has been given several community resources in both [facility] and [place].He prefers to stay in rest areas in his car. He is still working on his relationship with his son. In reviewing patient files and previous notes he is again here for the same reason. His SSI will be present on Monday the 30th. Patient once medically clear  May remain choosing to live his life the way he wants.   NO SW needs   [employer] LCSW [phone]

## 2018-06-17 NOTE — H&P (Signed)
[facility] [place] NAME: Carver Murakami    MR#:  [id number]  DATE OF BIRTH:  November 01, 1933  DATE OF ADMISSION:  06/17/2018  PRIMARY CARE PHYSICIAN: System, Pcp Not In   REQUESTING/REFERRING PHYSICIAN: Arta Silence, MD  CHIEF COMPLAINT:   Chief Complaint  Patient presents with   Weakness     HISTORY OF PRESENT ILLNESS:  Ledford Goodson  is a 84 y.o. male with a known history of HTN, T2DM, HLD, COPD, depression, lumbar spinal stenosis, and anxiety who presented to the ED with weakness that started today. He states he was at a rest area and was laying on a bench because he was too weak to get up. A paramedic found him and recommended that he come to the ED. He endorses shortness of breath and worsening lower extremity edema over the last 6 months. Shortness of breath is worse with exertion. He endorses dry cough. Denies fevers and chills. Denies orthopnea. He uses 2.5L O2 chronically at home.  In the ED, he required 4L O2 for shortness of breath. Labs were significant only for mildly elevated lactic acid to 2.1. CXR showed COPD with extensive upper lobe bullous disease. Given his weakness and shortness of breath with exertion, hospitalists were called for admission.  PAST MEDICAL HISTORY:   Past Medical History:  Diagnosis Date  . ABSCESS 12/03/2009  . ABSCESS, FINGER 04/07/2010  . ANXIETY 11/03/2009  . ASTHMA 11/03/2009  . CHRONIC OBSTRUCTIVE PULMONARY DISEASE, ACUTE EXACERBATION 11/03/2009  . Colon cancer ([place])   . COPD 11/03/2009  . DEPRESSION 11/03/2009  . DIABETES MELLITUS, TYPE II 11/03/2009  . [place] DISEASE, LUMBAR 11/03/2009  . EMPHYSEMA, BULLOUS 11/03/2009  . GERD 11/03/2009  . HYPERLIPIDEMIA 11/03/2009  . HYPERTENSION 11/03/2009  . Kidney stones 01/30/12   "I've had them 7 times; always have passed them"  . PEPTIC ULCER DISEASE 11/03/2009  . Pneumonia   . RASH-NONVESICULAR 11/03/2009  . RESTLESS LEG SYNDROME 11/03/2009  . Shortness of  breath    "sometimes; at any time"  . SPINAL STENOSIS, LUMBAR 11/03/2009    PAST SURGICAL HISTORY:   Past Surgical History:  Procedure Laterality Date  . Colon cancer surgery    . [place]   left  . INGUINAL HERNIA REPAIR  10/2011   left  . ROTATOR CUFF REPAIR  2003   left  . TONSILLECTOMY  1960    SOCIAL HISTORY:   Social History   Tobacco Use  . Smoking status: Current Every Day Smoker    Packs/day: 1.00    Years: 41.00    Pack years: 41.00    Types: Cigarettes  . Smokeless tobacco: Never Used  . Tobacco comment: "stopped smoking 04/21/1991 then restarted in 2012"  Substance Use Topics  . Alcohol use: No    FAMILY HISTORY:   Family History  Problem Relation Age of Onset  . Heart disease Father   . Heart disease Mother   . Cancer Brother        lung    DRUG ALLERGIES:  No Known Allergies  REVIEW OF SYSTEMS:   Review of Systems  Constitutional: Positive for malaise/fatigue. Negative for chills and fever.  HENT: Negative for congestion and sore throat.   Eyes: Negative for blurred vision and double vision.  Respiratory: Positive for cough and shortness of breath. Negative for sputum production.   Cardiovascular: Positive for leg swelling. Negative for chest pain, palpitations and orthopnea.  Gastrointestinal: Negative  for abdominal pain, nausea and vomiting.  Genitourinary: Negative for dysuria and frequency.  Musculoskeletal: Negative for back pain and neck pain.  Neurological: Positive for weakness. Negative for dizziness, focal weakness and headaches.  Psychiatric/Behavioral: Negative for depression. The patient is not nervous/anxious.     MEDICATIONS AT HOME:   Prior to Admission medications   Medication Sig Start Date End Date Taking? Authorizing Provider  albuterol (PROVENTIL HFA;VENTOLIN HFA) 108 (90 Base) MCG/ACT inhaler Inhale 2 puffs into the lungs every 6 (six) hours as needed. 05/24/18   Salary, Holly Bodily D, MD  aspirin 81 MG  chewable tablet Chew 1 tablet daily by mouth. 12/05/12   [provider]  atorvastatin (LIPITOR) 40 MG tablet Take 1 tablet (40 mg total) by mouth daily. 12/16/16   Bettey Costa, MD  doxycycline (VIBRAMYCIN) 100 MG capsule Take 1 capsule (100 mg total) by mouth 2 (two) times daily. 06/07/18   Quintella Reichert, MD  Fluticasone-Salmeterol (ADVAIR) 250-50 MCG/DOSE AEPB Inhale 1 puff into the lungs 2 (two) times daily. 06/07/18   Quintella Reichert, MD  ipratropium-albuterol (DUONEB) 0.5-2.5 (3) MG/3ML SOLN Take 3 mLs by nebulization every 6 (six) hours as needed (as needed for shortness of breath). 03/26/17   Arrien, Jimmy Picket, MD  metFORMIN (GLUCOPHAGE) 500 MG tablet Take 500 mg by mouth 2 (two) times daily with a meal.    [provider]  midodrine (PROAMATINE) 5 MG tablet Take 5 mg by mouth 3 (three) times daily with meals.  05/11/18   [provider]  nitroGLYCERIN (NITROSTAT) 0.4 MG SL tablet Place 1 tablet as needed under the tongue. 04/16/17   [provider]  nystatin (MYCOSTATIN/NYSTOP) powder Apply topically 4 (four) times daily. 05/20/18   Loney Hering, MD  predniSONE (DELTASONE) 10 MG tablet Take 4 tablets (40 mg total) by mouth daily. 06/07/18   Quintella Reichert, MD  tamsulosin (FLOMAX) 0.4 MG CAPS capsule Take 0.4 mg daily by mouth.    [provider]      VITAL SIGNS:  Blood pressure 121/78, pulse 77, temperature 97.8 F (36.6 C), temperature source Oral, resp. rate (!) 27, height 5\' 11"  (1.803 m), weight 76.2 kg, SpO2 99 %.  PHYSICAL EXAMINATION:  Physical Exam  GENERAL:  84 y.o.-year-old patient lying in the bed with no acute distress.  EYES: Pupils equal, round, reactive to light and accommodation. No scleral icterus. Extraocular muscles intact.  HEENT: Head atraumatic, normocephalic. Oropharynx and nasopharynx clear. Moist mucous membranes. NECK:  Supple, no jugular venous distention. No thyroid enlargement, no tenderness.  LUNGS:  +diffuse expiratory wheezing present. No use of accessory muscles of respiration. [place] in place. CARDIOVASCULAR: S1, S2 normal. No murmurs, rubs, or gallops.  ABDOMEN: Soft, nontender, nondistended. Bowel sounds present. No organomegaly or mass.  EXTREMITIES: 2+ pitting edema to the knees bilaterally, no cyanosis or clubbing.  NEUROLOGIC: Cranial nerves II through XII are intact. +global weakness. Sensation intact. Gait not checked.  PSYCHIATRIC: The patient is alert and oriented x 3.  SKIN: No obvious rash, lesion, or ulcer.   LABORATORY PANEL:   CBC Recent Labs  Lab 06/17/18 1602  WBC 9.5  HGB 12.2*  HCT 34.4*  PLT 199   ------------------------------------------------------------------------------------------------------------------  Chemistries  Recent Labs  Lab 06/17/18 1602  NA 141  K 3.6  CL 106  CO2 28  GLUCOSE 102*  BUN 19  CREATININE 0.96  CALCIUM 8.8*  AST 16  ALT 14  ALKPHOS 50  BILITOT 1.1   ------------------------------------------------------------------------------------------------------------------  Cardiac Enzymes  Recent Labs  Lab 06/17/18 1602  TROPONINI <0.03   ------------------------------------------------------------------------------------------------------------------  RADIOLOGY:  Ct Head Wo Contrast  Result Date: 06/17/2018 CLINICAL DATA:  Ataxia EXAM: CT HEAD WITHOUT CONTRAST TECHNIQUE: Contiguous axial images were obtained from the base of the skull through the vertex without intravenous contrast. COMPARISON:  CT brain 01/15/2018 FINDINGS: Brain: No acute territorial infarction, hemorrhage or intracranial mass. Mild atrophy. Stable ventricle size. Vascular: No hyperdense vessels. Scattered calcifications at the carotid siphon Skull: Normal. Negative for fracture or focal lesion. Sinuses/Orbits: No acute finding. Other: None IMPRESSION: No CT evidence for acute intracranial abnormality.  Mild atrophy Electronically Signed   By: Donavan Foil M.D.   On: 06/17/2018 20:10   Dg Chest Portable 1 View  Result Date: 06/17/2018 CLINICAL DATA:  Shortness of breath, near syncopal episode EXAM: PORTABLE CHEST 1 VIEW COMPARISON:  Chest x-ray 06/16/2018 CT chest 05/29/2018 FINDINGS: Extensive left upper lobe bullous disease. Right apical bullous disease. Bilateral chronic interstitial lung disease with bibasilar atelectasis. Left basilar airspace disease which may reflect atelectasis versus pneumonia. No other areas of focal consolidation, pleural effusion or pneumothorax. Stable cardiomediastinal silhouette. No acute osseous abnormality. IMPRESSION: 1. COPD with extensive left upper lobe bullous disease and to lesser extent right apical bullous disease. Left basilar airspace disease which may reflect atelectasis versus pneumonia. Electronically Signed   By: Kathreen Devoid   On: 06/17/2018 16:28      IMPRESSION AND PLAN:   Acute on chronic hypoxic respiratory failure- likely secondary to COPD exacerbation + CHF exacerbation. Requiring 4L O2 in the ED (on 2.5L O2 at home). - wean O2 to home 2.5L as able - palliative care consult for goals of care  Acute exacerbation of COPD- CXR findings consistent with bullous COPD and patient has diffuse wheezing on exam. - solumedrol IV, can potentially transition to prednisone tomorrow. - continue home inhalers - duonebs and albuterol prn - check procalcitonin to rule out infection  Acute exacerbation of chronic diastolic heart failure- last ECHO 12/16/2016 with normal EF and G1DD. Patient with lower extremity edema to the knees bilaterally. - lasix 40mg  IV daily - repeat ECHO ordered - discussed importance of low salt diet - apply ted hose  History of orthostasis/hypotension- BPs soft in the ED - continue midodrine - check orthostatics in the morning  Generalized weakness/deconditioning- likely related to end stage COPD - PT/OT consult  Type 2 diabetes mellitus- stable - sensitive SSI  while receiving steroids  BPH- stable - continue flomax  Tobacco use- smokes 1/2 ppd - tobacco cessation counseling performed by me in the ED - nicotine patch prn  Homelessness- lives half of the time in motels and the other half in his car at rest stops. States he is looking for a place to live. - Seen by CSW in the ED- no SW needs were identified.  All the records are reviewed and case discussed with ED provider. Management plans discussed with the patient, family and they are in agreement.  CODE STATUS: DNR  TOTAL TIME TAKING CARE OF THIS PATIENT: 45 minutes.    Berna Spare Tyishia Aune M.D on 06/17/2018 at 8:55 PM  Between 7am to 6pm - Pager - [phone]  After 6pm go to [email or web address] - Proofreader  [facility]  Office  [phone]  CC: Primary care physician; System, Pcp Not In   Note: This dictation was prepared with Dragon dictation along with smaller phrase technology. Any transcriptional errors that result from this process are unintentional.

## 2018-06-17 NOTE — ED Provider Notes (Signed)
[facility] Emergency Department Provider Note ____________________________________________   First MD Initiated Contact with Patient 06/17/18 1543     (approximate)  I have reviewed the triage vital signs and the nursing notes.   HISTORY  Chief Complaint Near Syncope    HPI [name] [name] is a 84 y.o. male with PMH as noted below including COPD who presents with near syncope, gradual onset today, persistent course, and associated with increased shortness of breath.  Patient also reports swelling to bilateral ankles over the last 2 to 3 days, now with swelling in his arms as well.  He states he went to an outside hospital in [facility] yesterday and states "they did nothing for me."  Past Medical History:  Diagnosis Date  . ABSCESS 12/03/2009  . ABSCESS, FINGER 04/07/2010  . ANXIETY 11/03/2009  . ASTHMA 11/03/2009  . CHRONIC OBSTRUCTIVE PULMONARY DISEASE, ACUTE EXACERBATION 11/03/2009  . Colon cancer ([place])   . COPD 11/03/2009  . DEPRESSION 11/03/2009  . DIABETES MELLITUS, TYPE II 11/03/2009  . [place] DISEASE, LUMBAR 11/03/2009  . EMPHYSEMA, BULLOUS 11/03/2009  . GERD 11/03/2009  . HYPERLIPIDEMIA 11/03/2009  . HYPERTENSION 11/03/2009  . Kidney stones 01/30/12   "I've had them 7 times; always have passed them"  . PEPTIC ULCER DISEASE 11/03/2009  . Pneumonia   . RASH-NONVESICULAR 11/03/2009  . RESTLESS LEG SYNDROME 11/03/2009  . Shortness of breath    "sometimes; at any time"  . SPINAL STENOSIS, LUMBAR 11/03/2009    Patient Active Problem List   Diagnosis Date Noted  . COPD exacerbation ([place]) 03/24/2017  . Hypoxia 03/24/2017  . Diabetes mellitus type 2 in nonobese ([place]) 03/24/2017  . Polysubstance abuse ([place]) 03/24/2017  . Pressure injury of skin 03/24/2017  . Overdose of benzodiazepine 01/16/2017  . Unresponsiveness   . HCAP (healthcare-associated pneumonia)   . Respiratory failure ([place]) 01/15/2017  . CVA (cerebral vascular accident) ([place]) 12/15/2016   . Chest pain 12-22-202018  . PNA (pneumonia) 03/17/2012  . Weakness generalized 03/17/2012  . Generalized weakness 01/30/2012  . Fall at home 01/30/2012  . Physical deconditioning 01/30/2012  . Nausea vomiting and diarrhea 01/15/2012  . UTI (urinary tract infection) 01/15/2012  . Cocaine abuse ([place]) 08/11/2011  . Tobacco abuse 08/11/2011  . Orthostasis 12/23/2010  . Dehydration 12/23/2010  . Abdominal pain, other specified site 12/23/2010  . Dizziness 12/23/2010  . Weight loss 12/23/2010  . Left lumbar radiculopathy 12/23/2010  . DIABETES MELLITUS, TYPE II 11/03/2009  . HYPERLIPIDEMIA 11/03/2009  . ANXIETY 11/03/2009  . DEPRESSION 11/03/2009  . RESTLESS LEG SYNDROME 11/03/2009  . HYPERTENSION 11/03/2009  . EMPHYSEMA, BULLOUS 11/03/2009  . ASTHMA 11/03/2009  . COPD 11/03/2009  . GERD 11/03/2009  . PEPTIC ULCER DISEASE 11/03/2009  . [place] DISEASE, LUMBAR 11/03/2009  . SPINAL STENOSIS, LUMBAR 11/03/2009  . NEPHROLITHIASIS, HX OF 11/03/2009    Past Surgical History:  Procedure Laterality Date  . Colon cancer surgery    . [place]   left  . INGUINAL HERNIA REPAIR  10/2011   left  . ROTATOR CUFF REPAIR  2003   left  . TONSILLECTOMY  1960    Prior to Admission medications   Medication Sig Start Date End Date Taking? Authorizing Provider  albuterol (PROVENTIL HFA;VENTOLIN HFA) 108 (90 Base) MCG/ACT inhaler Inhale 2 puffs into the lungs every 6 (six) hours as needed. 05/24/18   Salary, Holly Bodily D, MD  aspirin 81 MG chewable tablet Chew 1 tablet daily by mouth. 12/05/12  [provider]  atorvastatin (LIPITOR) 40 MG tablet Take 1 tablet (40 mg total) by mouth daily. 12/16/16   Bettey Costa, MD  doxycycline (VIBRAMYCIN) 100 MG capsule Take 1 capsule (100 mg total) by mouth 2 (two) times daily. 06/07/18   Quintella Reichert, MD  Fluticasone-Salmeterol (ADVAIR) 250-50 MCG/DOSE AEPB Inhale 1 puff into the lungs 2 (two) times daily. 06/07/18   Quintella Reichert, MD    ipratropium-albuterol (DUONEB) 0.5-2.5 (3) MG/3ML SOLN Take 3 mLs by nebulization every 6 (six) hours as needed (as needed for shortness of breath). 03/26/17   Arrien, Jimmy Picket, MD  metFORMIN (GLUCOPHAGE) 500 MG tablet Take 500 mg by mouth 2 (two) times daily with a meal.    [provider]  midodrine (PROAMATINE) 5 MG tablet Take 5 mg by mouth 3 (three) times daily with meals.  05/11/18   [provider]  nitroGLYCERIN (NITROSTAT) 0.4 MG SL tablet Place 1 tablet as needed under the tongue. 04/16/17   [provider]  nystatin (MYCOSTATIN/NYSTOP) powder Apply topically 4 (four) times daily. 05/20/18   Loney Hering, MD  predniSONE (DELTASONE) 10 MG tablet Take 4 tablets (40 mg total) by mouth daily. 06/07/18   Quintella Reichert, MD  tamsulosin (FLOMAX) 0.4 MG CAPS capsule Take 0.4 mg daily by mouth.    [provider]    Allergies Patient has no known allergies.  Family History  Problem Relation Age of Onset  . Heart disease Father   . Heart disease Mother   . Cancer Brother        lung    Social History Social History   Tobacco Use  . Smoking status: Current Every Day Smoker    Packs/day: 1.00    Years: 41.00    Pack years: 41.00    Types: Cigarettes  . Smokeless tobacco: Never Used  . Tobacco comment: "stopped smoking 04/21/1991 then restarted in 2012"  Substance Use Topics  . Alcohol use: No  . Drug use: No    Review of Systems  Constitutional: No fever. Eyes: No redness. ENT: No sore throat. Cardiovascular: Denies chest pain. Respiratory: Positive for shortness of breath. Gastrointestinal: No vomiting or diarrhea.  Genitourinary: Negative for dysuria.  Musculoskeletal: Negative for back pain.  Positive for swelling. Skin: Negative for rash. Neurological: Negative for headaches, focal weakness or numbness.   ____________________________________________   PHYSICAL EXAM:  VITAL SIGNS: ED Triage Vitals  Enc Vitals Group      BP      Pulse      Resp      Temp      Temp src      SpO2      Weight      Height      Head Circumference      Peak Flow      Pain Score      Pain Loc      Pain Edu?      Excl. in [place]?     Constitutional: Alert and oriented.  Slightly weak appearing but in no acute distress. Eyes: Conjunctivae are normal.  Head: Atraumatic. Nose: No congestion/rhinnorhea. Mouth/Throat: Mucous membranes are somewhat dry.   Neck: Normal range of motion.  Cardiovascular: Normal rate, regular rhythm. Grossly normal heart sounds.  Good peripheral circulation. Respiratory: Normal respiratory effort.  No retractions.  Faint wheezing bilaterally. Gastrointestinal: Soft and nontender. No distention.  Genitourinary: No flank tenderness. Musculoskeletal: 1+ bilateral lower extremity edema up to level of ankles.  Extremities warm  and well perfused.  Neurologic:  Normal speech and language. No gross focal neurologic deficits are appreciated.  Skin:  Skin is warm and dry. No rash noted. Psychiatric: Mood and affect are normal. Speech and behavior are normal.  ____________________________________________   LABS (all labs ordered are listed, but only abnormal results are displayed)  Labs Reviewed  COMPREHENSIVE METABOLIC PANEL - Abnormal; Notable for the following components:      Result Value   Glucose, Bld 102 (*)    Calcium 8.8 (*)    Total Protein 5.4 (*)    Albumin 3.2 (*)    All other components within normal limits  CBC WITH DIFFERENTIAL/PLATELET - Abnormal; Notable for the following components:   RBC 3.95 (*)    Hemoglobin 12.2 (*)    HCT 34.4 (*)    RDW 17.7 (*)    Neutro Abs 7.8 (*)    All other components within normal limits  LACTIC ACID, PLASMA - Abnormal; Notable for the following components:   Lactic Acid, Venous 2.1 (*)    All other components within normal limits  LACTIC ACID, PLASMA  TROPONIN I  BRAIN NATRIURETIC PEPTIDE  URINALYSIS, COMPLETE (UACMP) WITH MICROSCOPIC    ____________________________________________  EKG  ED ECG REPORT I, Arta Silence, the attending physician, personally viewed and interpreted this ECG.  Date: 06/17/2018 EKG Time: 1547 Rate: 82 Rhythm: normal sinus rhythm QRS Axis: normal Intervals: normal ST/T Wave abnormalities: normal Narrative Interpretation: no evidence of acute ischemia  ____________________________________________  RADIOLOGY  CXR: No focal infiltrate  ____________________________________________   PROCEDURES  Procedure(s) performed: No  Procedures  Critical Care performed: No ____________________________________________   INITIAL IMPRESSION / ASSESSMENT AND PLAN / ED COURSE  Pertinent labs & imaging results that were available during my care of the patient were reviewed by me and considered in my medical decision making (see chart for details).  84 year old male with PMH as noted above including history of COPD (but no documented history of CHF) presents with near syncope as well as bilateral lower and now upper extremity swelling over the last few days.  He also reports shortness of breath which is mostly chronic.  The patient states he went to an outside hospital [facility] yesterday and had some work-up but nothing was done for him.  I reviewed the past medical records in Epic; the patient is actually had several ED visits in our system over the last month, and one admission here from 9/1 to 05/24/2018.  He was seen at [facility] 11 days ago, and at [facility] 2 weeks ago.  The primary complaint during these visits is been shortness of breath.  I also see that the patient had an echo in 2018 which showed normal EF.  On exam, the vital signs are normal.  The patient's O2 saturation is in the mid 90s on room air.  He does have some peripheral edema especially around the ankles and dorsal feet and possibly a small amount in the arms although he actually looks slightly dehydrated.  He  has some wheezing bilaterally.  Patient states that he lives in [place] and has not been able to get himself back down there, although he has been in the area for at least the last month.  In terms of the acute presentation, differential includes new onset CHF, peripheral edema, pneumonia or other infectious cause, dehydration, electrolyte abnormality, or less likely cardiac etiology.  We will obtain chest x-ray and lab work-up.  I will also attempt to assess  the patient's social situation to determine safe disposition.  ----------------------------------------- 9:10 PM on 06/17/2018 -----------------------------------------  The patient's workup was largely negative.  After hydration we checked orthostatics; he did not have significant change in vital signs but was very unsteady on his feet.  Although he presented with slight edema, his overall picture is more consistent with hypovolemia.  At this time, the patient is not appropriate for discharge especially given that he is living in a motel and has no one with him.  I will add on a CT to rule out hydrocephalus, and he should probably get an echo to rule out CHF given the new edema.  I discussed the case with Dr. Brett Albino who agreed to accept the admission.   ____________________________________________   FINAL CLINICAL IMPRESSION(S) / ED DIAGNOSES  Final diagnoses:  Near syncope  Peripheral edema  Gait disturbance      NEW MEDICATIONS STARTED DURING THIS VISIT:  New Prescriptions   No medications on file     Note:  This document was prepared using Dragon voice recognition software and may include unintentional dictation errors.    Arta Silence, MD 06/17/18 2112

## 2018-06-17 NOTE — ED Notes (Signed)
Pt requesting coffee. Advised too late, so he requested milk and graham crackers and received them.

## 2018-06-17 NOTE — Progress Notes (Signed)
Family Meeting Note  Advance Directive:no  Today a meeting took place with the Patient.  Patient is able to participate.  The following clinical team members were present during this meeting:MD  The following were discussed:Patient's diagnosis: , Patient's progosis: Unable to determine and Goals for treatment: DNR  Additional follow-up to be provided: palliative consult placed  Time spent during discussion:20 minutes  Evette Doffing, MD

## 2018-06-17 NOTE — ED Notes (Signed)
Pt did not want me to call his son and let him know he is here. He states the last time a nurse called the son yelled at him and the nurse. States he came here a month ago.

## 2018-06-18 ENCOUNTER — Inpatient Hospital Stay (HOSPITAL_COMMUNITY)
Admit: 2018-06-18 | Discharge: 2018-06-18 | Disposition: A | Discharge status: Home / Self Care | Payer: Medicare (Managed Care) | Attending: Internal Medicine | Admitting: Internal Medicine

## 2018-06-18 DIAGNOSIS — I5030 Unspecified diastolic (congestive) heart failure: Secondary | ICD-10-CM

## 2018-06-18 LAB — CBC
HCT: 37 % — ABNORMAL LOW (ref 40.0–52.0)
HEMOGLOBIN: 12.5 g/dL — AB (ref 13.0–18.0)
MCH: 29.6 pg (ref 26.0–34.0)
MCHC: 33.7 g/dL (ref 32.0–36.0)
MCV: 87.8 fL (ref 80.0–100.0)
Platelets: 200 10*3/uL (ref 150–440)
RBC: 4.22 MIL/uL — ABNORMAL LOW (ref 4.40–5.90)
RDW: 17.8 % — AB (ref 11.5–14.5)
WBC: 7.2 10*3/uL (ref 3.8–10.6)

## 2018-06-18 LAB — GLUCOSE, CAPILLARY
Glucose-Capillary: 79 mg/dL (ref 70–99)
Glucose-Capillary: 129 mg/dL — ABNORMAL HIGH (ref 70–99)
Glucose-Capillary: 217 mg/dL — ABNORMAL HIGH (ref 70–99)
Glucose-Capillary: 196 mg/dL — ABNORMAL HIGH (ref 70–99)

## 2018-06-18 LAB — BASIC METABOLIC PANEL
Anion gap: 5 (ref 5–15)
BUN: 18 mg/dL (ref 8–23)
CALCIUM: 8.6 mg/dL — AB (ref 8.9–10.3)
CO2: 30 mmol/L (ref 22–32)
Chloride: 106 mmol/L (ref 98–111)
Creatinine, Ser: 0.96 mg/dL (ref 0.61–1.24)
GFR calc Af Amer: >60 mL/min (ref >60)
GLUCOSE: 120 mg/dL — AB (ref 70–99)
Potassium: 3.5 mmol/L (ref 3.5–5.1)
SODIUM: 141 mmol/L (ref 135–145)

## 2018-06-18 LAB — ECHOCARDIOGRAM COMPLETE
HEIGHTINCHES: 71 in
WEIGHTICAEL: 2772.8 [oz_av]

## 2018-06-18 LAB — PROCALCITONIN

## 2018-06-18 NOTE — Progress Notes (Signed)
Physical Therapy Evaluation Patient Details Name: [name] [name] MRN: [id number] DOB: [birth year] Today's Date: 06/18/2018   History of Present Illness  Pt is a 84 y.o. male with a known history of HTN, T2DM, HLD, COPD, depression, lumbar spinal stenosis, and anxiety who presented to the ED with weakness that started 06-17-18. He states he was at a rest area and was laying on a bench because he was too weak to get up. A paramedic found him and recommended that he come to the ED. He endorses shortness of breath and worsening lower extremity edema over the last 6 months. Shortness of breath is worse with exertion. He endorses dry cough. Denies fevers and chills. Denies orthopnea. He is from [place] but stated he has no intention on returning to [facility].  Clinical Impression  Pt is a pleasant 84 year old male who was admitted for COPD axacerbation. Per pt has had one fall in the last 6 months, walking across the yard, no injury reported. Pt performs bed mobility independently, transfers with supervision, and ambulation with CGA. Pt experienced orthostatic sx with short distance amb, seated after sx arose during amb BP was 163/82, once rested pt stood again BP measured 120/71. Prior to amb pt denied any dizziness sx. Pt demonstrates deficits with balance, mobility and dizziness. Pt is not at his current baseline and would benefit from skilled PT to address above deficits and promote optimal return to PLOF.      Follow Up Recommendations Outpatient PT    Equipment Recommendations  Rolling walker with 5" wheels    Recommendations for Other Services       Precautions / Restrictions Precautions Precautions: Fall Precaution Comments: Pt SOB even with short distances and refuses to use BSC or urinal---assisted him sitted in recliner to bathroom door and walked with FWW with close CGA.  Info relayed to [place] with rec to keep chair and be alarm on. Restrictions Weight Bearing Restrictions: No      Mobility  Bed Mobility Overal bed mobility: Independent             General bed mobility comments: Pt demonstrates safe bed mobility.  Transfers Overall transfer level: Needs assistance Equipment used: Rolling walker (2 wheeled) Transfers: Sit to/from Stand Sit to Stand: Supervision         General transfer comment: Pt requires 1-2 tries to initiate anterior translation. In standing patient reports no dizziness. No apparent LOB.  Ambulation/Gait Ambulation/Gait assistance: Min guard Gait Distance (Feet): 60 Feet Assistive device: Rolling walker (2 wheeled) Gait Pattern/deviations: Step-through pattern Gait velocity: decreased   General Gait Details: Pt ambulates with normal reciprocal pattern. Denies dizziness until approx 60 feet. Pt sat in chair BP read 163/82. When sx resolved pt stood BP dropped to 120/71. D/t orthostatic sx pt asked to sit down and wheeled back in to his room.  Stairs            Wheelchair Mobility    Modified Rankin (Stroke Patients Only)       Balance Overall balance assessment: Needs assistance Sitting-balance support: No upper extremity supported;Feet supported Sitting balance-Leahy Scale: Good Sitting balance - Comments: No apparent LOB with mod weight shift   Standing balance support: Bilateral upper extremity supported Standing balance-Leahy Scale: Fair Standing balance comment: Pt used RW for UE support, no apparent LOB.                             Pertinent  Vitals/Pain Pain Assessment: No/denies pain    Home Living Family/patient expects to be discharged to:: Unsure                 Additional Comments: Pt has been staying in motels since coming up from [place] to relocate. He currently has all of his belongings in his car in the [facility] parking lot. No definitive place to stay after discharge    Prior Function Level of Independence: Independent         Comments: Independent with ADLs/IADLs. Drives. Reports  several falls from syncope     Hand Dominance   Dominant Hand: Right    Extremity/Trunk Assessment   Upper Extremity Assessment Upper Extremity Assessment: Overall WFL for tasks assessed    Lower Extremity Assessment Lower Extremity Assessment: Defer to PT evaluation       Communication   Communication: No difficulties  Cognition Arousal/Alertness: Awake/alert Behavior During Therapy: Anxious;Impulsive Overall Cognitive Status: Within Functional Limits for tasks assessed                                 General Comments: Follows commands consistantly      General Comments      Exercises Other Exercises Other Exercises: Supine BLE ankle pumps, SLRs, hip ABD/ADD, seated LAQs. All ther-ex performed x10 reps with VC for technique.   Assessment/Plan    PT Assessment Patient needs continued PT services  PT Problem List Decreased activity tolerance;Decreased balance;Decreased mobility       PT Treatment Interventions DME instruction;Gait training;Stair training;Functional mobility training;Therapeutic activities;Therapeutic exercise;Balance training;Neuromuscular re-education;Patient/family education    PT Goals (Current goals can be found in the Care Plan section)  Acute Rehab PT Goals Patient Stated Goal: to get out of here soon PT Goal Formulation: With patient Time For Goal Achievement: 06/25/18 Potential to Achieve Goals: Fair    Frequency Min 2X/week   Barriers to discharge   Pt is homeless and has decreased access to continued care.    Co-evaluation               AM-PAC PT "6 Clicks" Daily Activity  Outcome Measure Difficulty turning over in bed (including adjusting bedclothes, sheets and blankets)?: None Difficulty moving from lying on back to sitting on the side of the bed? : None Difficulty sitting down on and standing up from a chair with arms (e.g., wheelchair, bedside commode, etc,.)?: A Little Help needed moving to and from a  bed to chair (including a wheelchair)?: A Little Help needed walking in hospital room?: A Little Help needed climbing 3-5 steps with a railing? : A Little 6 Click Score: 20    End of Session Equipment Utilized During Treatment: Gait belt Activity Tolerance: Patient tolerated treatment well;Treatment limited secondary to medical complications (Comment)(Pt has orthostatic sx with amb.) Patient left: in chair;with call bell/phone within reach;with chair alarm set Nurse Communication: Mobility status;Other (comment)(Orthostatic sx with amb.) PT Visit Diagnosis: Unsteadiness on feet (R26.81);Other abnormalities of gait and mobility (R26.89);History of falling (Z91.81);Dizziness and giddiness (R42)    Time: 1030-1056 PT Time Calculation (min) (ACUTE ONLY): 26 min   Charges:              Algis Downs, SPT  Algis Downs 06/18/2018, 12:01 PM

## 2018-06-18 NOTE — Progress Notes (Signed)
*  PRELIMINARY RESULTS* Echocardiogram 2D Echocardiogram has been performed.  [name] [name] 06/18/2018, 10:08 AM

## 2018-06-18 NOTE — Evaluation (Signed)
Occupational Therapy Evaluation Patient Details Name: [name] [name] MRN: [id number] DOB: 1934/02/19 Today's Date: 06/18/2018    History of Present Illness Pt is a 84 y.o. male with a known history of HTN, T2DM, HLD, COPD, depression, lumbar spinal stenosis, and anxiety who presented to the ED with weakness that started 06-17-18. He states he was at a rest area and was laying on a bench because he was too weak to get up. A paramedic found him and recommended that he come to the ED. He endorses shortness of breath and worsening lower extremity edema over the last 6 months. Shortness of breath is worse with exertion. He endorses dry cough. Denies fevers and chills. Denies orthopnea. He is from [place] but stated he has no intention on returning to [facility].   Clinical Impression   Pt is 84 year old who presents to [facility] hospital with SOB--see above.  He has a history of COPD and was in [facility] hospital 2-3 weeks ago for SOB.  Pt currently requires CGA to min assist for ADLs due to SOB, decreased endurance for functional tasks and at risk for falls.  His O2 sats on room air were 89% at beginning of session but he had just finished with PT and increased to 94% after resting and going to the bathroom to urinate and have BM.  He was swaying while standing at sink with increased SOB and talked to NSG and CNA about having chair and bed alarms on with supervision to CGA with FWW and not to ambulate from chair to bathroom but to push him in reclincer to bathroom door and walk with CGA with FWW.  Pt would benefit from skilled OT services to increase independence in ADLs, education in energy conservation techniques, pursed lip breathing and recommendations for home modifications to increase safety and prevent falls (once DC plan is identified).      Follow Up Recommendations  No OT follow up    Equipment Recommendations  Other (comment)(elastic shoe laces, reacher and sock aid)    Recommendations for Other Services        Precautions / Restrictions Precautions Precautions: Fall Precaution Comments: Pt SOB even with short distances and refuses to use BSC or urinal---assisted him sitted in recliner to bathroom door and walked with FWW with close CGA.  Info relayed to [place] with rec to keep chair and be alarm on. Restrictions Weight Bearing Restrictions: No      Mobility Bed Mobility Overal bed mobility: (P) Independent             General bed mobility comments: (P) Pt demonstrates safe bed mobility.  Transfers Overall transfer level: (P) Needs assistance Equipment used: (P) Rolling walker (2 wheeled) Transfers: (P) Sit to/from Stand Sit to Stand: (P) Supervision         General transfer comment: (P) Pt requires 1-2 tries to initiate anterior translation. In standing patient reports no dizziness. No apparent LOB.    Balance Overall balance assessment: (P) Needs assistance                                         ADL either performed or assessed with clinical judgement   ADL Overall ADL's : Needs assistance/impaired Eating/Feeding: Independent;Set up   Grooming: Wash/dry hands;Wash/dry face;Oral care;Set up;Brushing hair;Independent           Upper Body Dressing : Independent;Set up   Lower  Body Dressing: Set up;Minimal assistance;Sit to/from stand Lower Body Dressing Details (indicate cue type and reason): SOB with leaning forward to remove socks and rec use of elastic shoe laces, reacher and sock aid as needed to help conserve energy. Toilet Transfer: Min guard;Set up;Ambulation;Regular Toilet;Grab bars Toilet Transfer Details (indicate cue type and reason): Pt swaying while standing at toilet and sats were 89% prior to ambulation but had just finished with PT.  Sats increased to 94% after sitting after tolieting.         Functional mobility during ADLs: Supervision/safety General ADL Comments: Pt is at risk for falls due to SOB and decreased balance when standing  and ambulating for short distances---refuses to use BSC and rec he be pushed in recliner to bathroom door and then ambulate to bathroom with FWW and have bed and chair alarms on--updated RN Rodman Key and CNA Levada Dy     Vision Baseline Vision/History: Wears glasses Wears Glasses: At all times Patient Visual Report: No change from baseline       Perception     Praxis      Pertinent Vitals/Pain Pain Assessment: No/denies pain     Hand Dominance Right   Extremity/Trunk Assessment Upper Extremity Assessment Upper Extremity Assessment: Overall WFL for tasks assessed   Lower Extremity Assessment Lower Extremity Assessment: Defer to PT evaluation       Communication Communication Communication: No difficulties   Cognition Arousal/Alertness: Awake/alert Behavior During Therapy: Anxious;Impulsive Overall Cognitive Status: Within Functional Limits for tasks assessed                                 General Comments: (P) Follows commands consistantly   General Comments       Exercises     Shoulder Instructions      Home Living Family/patient expects to be discharged to:: Unsure                                 Additional Comments: Pt has been staying in motels since coming up from [place] to relocate. He currently has all of his belongings in his car in the [facility] parking lot. No definitive place to stay after discharge      Prior Functioning/Environment Level of Independence: Independent        Comments: Independent with ADLs/IADLs. Drives. Reports several falls from syncope        OT Problem List: Cardiopulmonary status limiting activity;Decreased activity tolerance;Impaired balance (sitting and/or standing);Decreased safety awareness      OT Treatment/Interventions: Self-care/ADL training;Patient/family education;Energy conservation;DME and/or AE instruction    OT Goals(Current goals can be found in the care plan section) Acute Rehab OT  Goals Patient Stated Goal: to get out of here soon OT Goal Formulation: With patient Time For Goal Achievement: 07/02/18 Potential to Achieve Goals: Good ADL Goals Pt Will Perform Lower Body Dressing: with set-up;with supervision;with adaptive equipment;sit to/from stand Pt Will Transfer to Toilet: with set-up;with supervision;regular height toilet;grab bars;stand pivot transfer Pt/caregiver will Perform Home Exercise Program: Independently;With written HEP provided  OT Frequency: Min 1X/week   Barriers to D/C:    was living out of his car and plans to relocate here to [facility] and not return to [employer] PT "6 Clicks" Daily Activity  Outcome Measure Help from another person eating meals?: None Help from another person taking care of personal grooming?: None Help from another person toileting, which includes using toliet, bedpan, or urinal?: A Little Help from another person bathing (including washing, rinsing, drying)?: A Little Help from another person to put on and taking off regular upper body clothing?: None Help from another person to put on and taking off regular lower body clothing?: A Little 6 Click Score: 21   End of Session Equipment Utilized During Treatment: Gait belt Nurse Communication: Precautions(rec chair and bed alarms due to risk of falling)  Activity Tolerance: Patient tolerated treatment well Patient left: in chair;with call bell/phone within reach;with chair alarm set;Other (comment)(Respiratory therapist present for breathing treatment )  OT Visit Diagnosis: History of falling (Z91.81);Unsteadiness on feet (R26.81)                Time: 1100-1139 OT Time Calculation (min): 39 min Charges:  OT General Charges $OT Visit: 1 Visit OT Evaluation $OT Eval Low Complexity: 1 Low OT Treatments $Self Care/Home Management : 23-37 mins  Chrys Racer, OTR/L ascom [phone] 06/18/18, 12:01 PM

## 2018-06-18 NOTE — Progress Notes (Signed)
PT Cancellation Note  Patient Details Name: [name] [name] MRN: [id number] DOB: Oct 15, 1933   Cancelled Treatment:     Pt unavailable for evaluation d/t ECHO in progress. Will attempt at later time/date when pt is available.  Algis Downs, SPT  Algis Downs 06/18/2018, 9:24 AM

## 2018-06-18 NOTE — Progress Notes (Signed)
[facility] [place] NAME: [name] [name]    MR#:  [id number]  DATE OF BIRTH:  1933/10/23  SUBJECTIVE:  CHIEF COMPLAINT:   Chief Complaint  Patient presents with  . Near Syncope   -complains of ankle swelling.  However blood pressure dropped with standing  REVIEW OF SYSTEMS:  Review of Systems  Constitutional: Negative for chills, fever and malaise/fatigue.  HENT: Negative for congestion, ear discharge, hearing loss and nosebleeds.   Respiratory: Positive for shortness of breath. Negative for cough and wheezing.   Cardiovascular: Positive for leg swelling. Negative for chest pain and palpitations.  Gastrointestinal: Negative for abdominal pain, constipation, diarrhea, nausea and vomiting.  Genitourinary: Negative for dysuria.  Musculoskeletal: Negative for myalgias.  Neurological: Negative for dizziness, seizures, weakness and headaches.  Psychiatric/Behavioral: Negative for depression.    DRUG ALLERGIES:  No Known Allergies  VITALS:  Blood pressure 137/76, pulse 91, temperature 98.2 F (36.8 C), temperature source Oral, resp. rate 18, height 5\' 11"  (1.803 m), weight 78.6 kg, SpO2 94 %.  PHYSICAL EXAMINATION:  Physical Exam  GENERAL:  84 y.o.-year-old patient lying in the bed with no acute distress.  EYES: Pupils equal, round, reactive to light and accommodation. No scleral icterus. Extraocular muscles intact.  HEENT: Head atraumatic, normocephalic. Oropharynx and nasopharynx clear.  NECK:  Supple, no jugular venous distention. No thyroid enlargement, no tenderness.  LUNGS: Decreased air movement bilaterally, no wheezing, rales,rhonchi or crepitation. No use of accessory muscles of respiration.  Decreased bibasilar breath sounds CARDIOVASCULAR: S1, S2 normal. No murmurs, rubs, or gallops.  ABDOMEN: Soft, nontender, nondistended. Bowel sounds present. No organomegaly or mass.  EXTREMITIES: No cyanosis, or clubbing.  1+ ankle  edema NEUROLOGIC: Cranial nerves II through XII are intact. Muscle strength 5/5 in all extremities. Sensation intact. Gait not checked.  PSYCHIATRIC: The patient is alert and oriented x 3.  SKIN: No obvious rash, lesion, or ulcer.    LABORATORY PANEL:   CBC Recent Labs  Lab 06/18/18 0346  WBC 7.2  HGB 12.5*  HCT 37.0*  PLT 200   ------------------------------------------------------------------------------------------------------------------  Chemistries  Recent Labs  Lab 06/17/18 1602  06/18/18 0346  NA 141  --  141  K 3.6  --  3.5  CL 106  --  106  CO2 28  --  30  GLUCOSE 102*  --  120*  BUN 19  --  18  CREATININE 0.96   < > 0.96  CALCIUM 8.8*  --  8.6*  AST 16  --   --   ALT 14  --   --   ALKPHOS 50  --   --   BILITOT 1.1  --   --    < > = values in this interval not displayed.   ------------------------------------------------------------------------------------------------------------------  Cardiac Enzymes Recent Labs  Lab 06/17/18 1602  TROPONINI <0.03   ------------------------------------------------------------------------------------------------------------------  RADIOLOGY:  Ct Head Wo Contrast  Result Date: 06/17/2018 CLINICAL DATA:  Ataxia EXAM: CT HEAD WITHOUT CONTRAST TECHNIQUE: Contiguous axial images were obtained from the base of the skull through the vertex without intravenous contrast. COMPARISON:  CT brain 01/15/2018 FINDINGS: Brain: No acute territorial infarction, hemorrhage or intracranial mass. Mild atrophy. Stable ventricle size. Vascular: No hyperdense vessels. Scattered calcifications at the carotid siphon Skull: Normal. Negative for fracture or focal lesion. Sinuses/Orbits: No acute finding. Other: None IMPRESSION: No CT evidence for acute intracranial abnormality.  Mild atrophy Electronically Signed   By: Madie Reno.D.  On: 06/17/2018 20:10   Dg Chest Portable 1 View  Result Date: 06/17/2018 CLINICAL DATA:  Shortness of  breath, near syncopal episode EXAM: PORTABLE CHEST 1 VIEW COMPARISON:  Chest x-ray 06/16/2018 CT chest 05/29/2018 FINDINGS: Extensive left upper lobe bullous disease. Right apical bullous disease. Bilateral chronic interstitial lung disease with bibasilar atelectasis. Left basilar airspace disease which may reflect atelectasis versus pneumonia. No other areas of focal consolidation, pleural effusion or pneumothorax. Stable cardiomediastinal silhouette. No acute osseous abnormality. IMPRESSION: 1. COPD with extensive left upper lobe bullous disease and to lesser extent right apical bullous disease. Left basilar airspace disease which may reflect atelectasis versus pneumonia. Electronically Signed   By: Kathreen Devoid   On: 06/17/2018 16:28    EKG:   Orders placed or performed during the hospital encounter of 06/17/18  . EKG 12-Lead  . EKG 12-Lead    ASSESSMENT AND PLAN:   84 year old male with past medical history significant for hypertension, hyperlipidemia, lumbar spinal stenosis, COPD, ongoing smoking who is homeless at the current time presents to hospital secondary to difficulty breathing  1.  Acute on chronic COPD exacerbation- -admit, IV steroids, currently requiring 2 L oxygen.  Wean as tolerated.  Prior history of home oxygen use, but has been homeless lately -Palliative care consultation for goals of care -Nebs and inhalers -Chest x-ray with no infection.  Not on any antibiotics.  2.  Ankle edema-known history of CHF.  Echocardiogram showing LVH and EF of 02% and mild diastolic dysfunction -Due to orthostatic hypotension, discontinue Lasix  3 dizziness-secondary to orthostatic hypotension.  Discontinue Lasix and monitor, if does not improve.  Will do IV fluids then. -On Midodrine  4.  Tobacco use disorder-strongly counseled.  Placed on nicotine patch  5.  DVT prophylaxis-Lovenox  PT consulted  All the records are reviewed and case discussed with Care Management/Social  Workerr. Management plans discussed with the patient, family and they are in agreement.  CODE STATUS: DNR  TOTAL TIME TAKING CARE OF THIS PATIENT: 37 minutes.   POSSIBLE D/C IN 1-2 DAYS, DEPENDING ON CLINICAL CONDITION.   Blondie Riggsbee M.D on 06/18/2018 at 3:30 PM  Between 7am to 6pm - Pager - [phone]  After 6pm go to [email or web address] - password [email or web address] [facility]  Office  [phone]  CC: Primary care physician; System, Pcp Not In

## 2018-06-18 NOTE — Clinical Social Work Note (Signed)
CSW consulted for homelessness. CSW met with patient to discuss discharge plan. Patient states that he lives in his car and stays at rest stops in the area. Patient reports that he has a son but they are estranged. Patient reports that he does not want to go to the shelter and already knows about the resources available for him. CSW did leave patient with a copy of resources for [employer]. CSW signing off. Please reconsult if further needs arise.   [place], [place]

## 2018-06-19 ENCOUNTER — Inpatient Hospital Stay: Payer: Medicare (Managed Care)

## 2018-06-19 DIAGNOSIS — Z515 Encounter for palliative care: Secondary | ICD-10-CM

## 2018-06-19 DIAGNOSIS — J441 Chronic obstructive pulmonary disease with (acute) exacerbation: Secondary | ICD-10-CM

## 2018-06-19 DIAGNOSIS — Z66 Do not resuscitate: Secondary | ICD-10-CM

## 2018-06-19 DIAGNOSIS — I509 Heart failure, unspecified: Secondary | ICD-10-CM

## 2018-06-19 LAB — GLUCOSE, CAPILLARY
GLUCOSE-CAPILLARY: 113 mg/dL — AB (ref 70–99)
GLUCOSE-CAPILLARY: 134 mg/dL — AB (ref 70–99)
Glucose-Capillary: 211 mg/dL — ABNORMAL HIGH (ref 70–99)
Glucose-Capillary: 152 mg/dL — ABNORMAL HIGH (ref 70–99)

## 2018-06-19 MED ORDER — SODIUM CHLORIDE 0.9 % IV SOLN
INTRAVENOUS | Status: AC
  Administered 2018-06-19: 12:00:00 via INTRAVENOUS

## 2018-06-19 NOTE — Progress Notes (Signed)
[facility] NAME: [name] [name]    MR#:  [id number]  DATE OF BIRTH:  [birth year]  SUBJECTIVE:  CHIEF COMPLAINT:   Chief Complaint  Patient presents with  . Near Syncope   -Remains on 2 L oxygen.  Has an oxygen concentrator in his car all the time. -Dizziness persists.  Positive for orthostatic hypotension  REVIEW OF SYSTEMS:  Review of Systems  Constitutional: Negative for chills, fever and malaise/fatigue.  HENT: Negative for congestion, ear discharge, hearing loss and nosebleeds.   Respiratory: Positive for shortness of breath. Negative for cough and wheezing.   Cardiovascular: Positive for leg swelling. Negative for chest pain and palpitations.  Gastrointestinal: Negative for abdominal pain, constipation, diarrhea, nausea and vomiting.  Genitourinary: Negative for dysuria.  Musculoskeletal: Negative for myalgias.  Neurological: Negative for dizziness, seizures, weakness and headaches.  Psychiatric/Behavioral: Negative for depression.    DRUG ALLERGIES:  No Known Allergies  VITALS:  Blood pressure 107/73, pulse (!) 108, temperature 98.2 F (36.8 C), temperature source Oral, resp. rate 20, height 5\' 11"  (1.803 m), weight 78.6 kg, SpO2 98 %.  PHYSICAL EXAMINATION:  Physical Exam  GENERAL:  84 y.o.-year-old patient lying in the bed with no acute distress.  EYES: Pupils equal, round, reactive to light and accommodation. No scleral icterus. Extraocular muscles intact.  HEENT: Head atraumatic, normocephalic. Oropharynx and nasopharynx clear.  NECK:  Supple, no jugular venous distention. No thyroid enlargement, no tenderness.  LUNGS: Decreased air movement bilaterally, no wheezing, rales,rhonchi or crepitation. No use of accessory muscles of respiration.  Decreased bibasilar breath sounds CARDIOVASCULAR: S1, S2 normal. No murmurs, rubs, or gallops.  ABDOMEN: Soft, nontender, nondistended. Bowel sounds present. No organomegaly  or mass.  EXTREMITIES: No cyanosis, or clubbing.  1+ ankle edema NEUROLOGIC: Cranial nerves II through XII are intact. Muscle strength 5/5 in all extremities. Sensation intact. Gait not checked.  PSYCHIATRIC: The patient is alert and oriented x 3.  SKIN: No obvious rash, lesion, or ulcer.    LABORATORY PANEL:   CBC Recent Labs  Lab 06/18/18 0346  WBC 7.2  HGB 12.5*  HCT 37.0*  PLT 200   ------------------------------------------------------------------------------------------------------------------  Chemistries  Recent Labs  Lab 06/17/18 1602  06/18/18 0346  NA 141  --  141  K 3.6  --  3.5  CL 106  --  106  CO2 28  --  30  GLUCOSE 102*  --  120*  BUN 19  --  18  CREATININE 0.96   < > 0.96  CALCIUM 8.8*  --  8.6*  AST 16  --   --   ALT 14  --   --   ALKPHOS 50  --   --   BILITOT 1.1  --   --    < > = values in this interval not displayed.   ------------------------------------------------------------------------------------------------------------------  Cardiac Enzymes Recent Labs  Lab 06/17/18 1602  TROPONINI <0.03   ------------------------------------------------------------------------------------------------------------------  RADIOLOGY:  Dg Chest 2 View  Result Date: 06/19/2018 CLINICAL DATA:  CHF EXAM: CHEST - 2 VIEW COMPARISON:  06/17/2018 FINDINGS: There is hyperinflation of the lungs compatible with COPD. No acute confluent airspace opacities or effusions. No acute bony abnormality. Heart is normal size. IMPRESSION: Severe emphysema.  No active disease. Electronically Signed   By: Rolm Baptise M.D.   On: 06/19/2018 08:14   Ct Head Wo Contrast  Result Date: 06/17/2018 CLINICAL DATA:  Ataxia EXAM: CT HEAD WITHOUT CONTRAST TECHNIQUE: Contiguous  axial images were obtained from the base of the skull through the vertex without intravenous contrast. COMPARISON:  CT brain 01/15/2018 FINDINGS: Brain: No acute territorial infarction, hemorrhage or intracranial  mass. Mild atrophy. Stable ventricle size. Vascular: No hyperdense vessels. Scattered calcifications at the carotid siphon Skull: Normal. Negative for fracture or focal lesion. Sinuses/Orbits: No acute finding. Other: None IMPRESSION: No CT evidence for acute intracranial abnormality.  Mild atrophy Electronically Signed   By: Donavan Foil M.D.   On: 06/17/2018 20:10   Dg Chest Portable 1 View  Result Date: 06/17/2018 CLINICAL DATA:  Shortness of breath, near syncopal episode EXAM: PORTABLE CHEST 1 VIEW COMPARISON:  Chest x-ray 06/16/2018 CT chest 05/29/2018 FINDINGS: Extensive left upper lobe bullous disease. Right apical bullous disease. Bilateral chronic interstitial lung disease with bibasilar atelectasis. Left basilar airspace disease which may reflect atelectasis versus pneumonia. No other areas of focal consolidation, pleural effusion or pneumothorax. Stable cardiomediastinal silhouette. No acute osseous abnormality. IMPRESSION: 1. COPD with extensive left upper lobe bullous disease and to lesser extent right apical bullous disease. Left basilar airspace disease which may reflect atelectasis versus pneumonia. Electronically Signed   By: Kathreen Devoid   On: 06/17/2018 16:28    EKG:   Orders placed or performed during the hospital encounter of 06/17/18  . EKG 12-Lead  . EKG 12-Lead    ASSESSMENT AND PLAN:   84 year old male with past medical history significant for hypertension, hyperlipidemia, lumbar spinal stenosis, COPD, ongoing smoking who is homeless at the current time presents to hospital secondary to difficulty breathing  1.  Acute on chronic COPD exacerbation- -continue IV steroids, currently requiring 2 L oxygen.  Wean as tolerated.   -Patient was placed on home oxygen, however currently using a portable oxygen concentrator as he has been traveling. -Appreciate Palliative care consultation for goals of care-outpatient  palliative follow-up recommended -Nebs and inhalers -Chest  x-ray with no infection.  Not on any antibiotics.  2.  Ankle edema-known history of CHF.  Echocardiogram showing LVH and EF of 80% and mild diastolic dysfunction -Due to orthostatic hypotension, discontinued Lasix  3 dizziness-secondary to orthostatic hypotension.  -Chest x-ray with no pulmonary edema.  Will give gentle hydration for 1 day and monitor.  -On Midodrine  4.  Tobacco use disorder-strongly counseled.  on nicotine patch  5.  DVT prophylaxis-Lovenox  PT consulted-outpatient PT at discharge    All the records are reviewed and case discussed with Care Management/Social Workerr. Management plans discussed with the patient, family and they are in agreement.  CODE STATUS: DNR  TOTAL TIME TAKING CARE OF THIS PATIENT: 35 minutes.   POSSIBLE D/C IN 1-2 DAYS, DEPENDING ON CLINICAL CONDITION.   Elida Harbin M.D on 06/19/2018 at 1:14 PM  Between 7am to 6pm - Pager - [phone]  After 6pm go to [email or web address] - password [email or web address] [facility]  Office  [phone]  CC: Primary care physician; System, Pcp Not In

## 2018-06-19 NOTE — Progress Notes (Signed)
Physical Therapy Treatment Patient Details Name: [name] [name] MRN: [id number] DOB: October 01, 1933 Today's Date: 06/19/2018    History of Present Illness Pt is a 84 y.o. male with a known history of HTN, T2DM, HLD, COPD, depression, lumbar spinal stenosis, and anxiety who presented to the ED with weakness that started 06-17-18. He states he was at a rest area and was laying on a bench because he was too weak to get up. A paramedic found him and recommended that he come to the ED. He endorses shortness of breath and worsening lower extremity edema over the last 6 months. Shortness of breath is worse with exertion. He endorses dry cough. Denies fevers and chills. Denies orthopnea. He is from [place] but stated he has no intention on returning to [facility].    PT Comments    Patient up walking in room when PT arrived.  He demonstrated some gait abnormalities indicative of fall risk and was slow to rise from bedside when standing but did not require physical assistance.  PT introduced seated core strengthening exercises and educated pt concerning benefit of there ex for balance and fall prevention.  Pt was able to perform all exercises without difficulty or report of pain.   Pt slightly impulsive today and emotionally labile.  PT redirected pt and assisted him with breathing exercises, prompting him to try to stay calm due to slightly elevated HR.  Pt will continue to benefit from skilled PT with focus on balance and fall prevention as well as HEP for conditioning.    Follow Up Recommendations  Outpatient PT     Equipment Recommendations  None recommended by PT    Recommendations for Other Services       Precautions / Restrictions Precautions Precautions: Fall Restrictions Weight Bearing Restrictions: No    Mobility  Bed Mobility Overal bed mobility: Independent             General bed mobility comments: No difficulty with bed mobility.  Transfers Overall transfer level: Needs  assistance Equipment used: Rolling walker (2 wheeled) Transfers: Sit to/from Stand Sit to Stand: Supervision         General transfer comment: Slow with rising from bed at standard height, supervision provided for safety and to monitor vitals.  Ambulation/Gait Ambulation/Gait assistance: Supervision Gait Distance (Feet): 40 Feet Assistive device: Rolling walker (2 wheeled) Gait Pattern/deviations: Step-through pattern   Gait velocity interpretation: 1.31 - 2.62 ft/sec, indicative of limited community ambulator General Gait Details: Pt up and walking in room when PT entered.  Able to demonstrate fair foot clearance and step length.  No AD required.   Stairs             Wheelchair Mobility    Modified Rankin (Stroke Patients Only)       Balance Overall balance assessment: Independent                                          Cognition Arousal/Alertness: Awake/alert Behavior During Therapy: Anxious;Impulsive;Restless Overall Cognitive Status: Within Functional Limits for tasks assessed                                 General Comments: Pt appears to be very restless today, speaking of his wife's passing, politics and his health.  Slightly impulsive and needed redirection but really just wanted  somebody to talk to.      Exercises Other Exercises Other Exercises: Seated leanbacks x10, leanbacks with marching x20, hip adduction with pillow squeeze x10. Other Exercises: Education provided concerning importance of core strengthening and frequent exercise for balance and fall prevention.  Pt expressed understanding.  x4 min.    General Comments        Pertinent Vitals/Pain Pain Assessment: No/denies pain    Home Living                      Prior Function            PT Goals (current goals can now be found in the care plan section) Acute Rehab PT Goals Patient Stated Goal: To be discharged soon. PT Goal Formulation:  With patient Time For Goal Achievement: 07/03/18 Potential to Achieve Goals: Good Progress towards PT goals: Progressing toward goals    Frequency    Min 2X/week      PT Plan Current plan remains appropriate    Co-evaluation              AM-PAC PT "6 Clicks" Daily Activity  Outcome Measure  Difficulty turning over in bed (including adjusting bedclothes, sheets and blankets)?: None Difficulty moving from lying on back to sitting on the side of the bed? : None Difficulty sitting down on and standing up from a chair with arms (e.g., wheelchair, bedside commode, etc,.)?: A Little Help needed moving to and from a bed to chair (including a wheelchair)?: None Help needed walking in hospital room?: None Help needed climbing 3-5 steps with a railing? : None 6 Click Score: 23    End of Session Equipment Utilized During Treatment: Oxygen Activity Tolerance: Patient tolerated treatment well Patient left: in bed Nurse Communication: Mobility status PT Visit Diagnosis: Muscle weakness (generalized) (M62.81)     Time: 8184-0375 PT Time Calculation (min) (ACUTE ONLY): 30 min  Charges:  $Therapeutic Exercise: 8-22 mins $Therapeutic Activity: 8-22 mins                     Roxanne Gates, PT, DPT    Roxanne Gates 06/19/2018, 4:10 PM

## 2018-06-19 NOTE — Consult Note (Signed)
Consultation Note Date: 06/19/2018   Patient Name: [name] [name]  DOB: 1934/04/03  MRN: [id number]  Age / Sex: 84 y.o., male  PCP: System, Pcp Not In Referring Physician: Gladstone Lighter, MD  Reason for Consultation: Establishing goals of care and Psychosocial/spiritual support  HPI/Patient Profile: 84 y.o. male   admitted on 06/17/2018 with   known history of HTN, T2DM, HLD, COPD, depression, lumbar spinal stenosis, and anxiety who presented to the ED with weakness that started today. He states he was at a rest area and was laying on a bench because he was too weak to get up. A paramedic found him and recommended that he come to the ED. He endorsed shortness of breath and worsening lower extremity edema, Shortness of breath  worse with exertion and  dry cough.   In the ED, he required 4L O2 for shortness of breath. Labs were significant only for mildly elevated lactic acid to 2.1. CXR showed COPD with extensive upper lobe bullous disease.   Apparently the patient is in a difficult situation as far as social support and housing goes.  According to notes he spends some time in motel rooms and some time in his car leaving him actually homeless.  Admitted for treatment and stabilization.  Clinical Assessment and Goals of Care:   This NP Wadie Lessen reviewed medical records, received report from team, assessed the patient and then meet at the patient's bedside  to discuss diagnosis, prognosis, GOC, disposition and options.  Concept of  Palliative Care was discussed  A detailed discussion was had today regarding advanced directives.  Concepts specific to code status, artifical feeding and hydration, continued IV antibiotics and rehospitalization was had.  The difference between a aggressive medical intervention path  and a palliative comfort care path for this patient at this time was had.  Values and goals of  care important to patient and family were attempted to be elicited.  Questions and concerns addressed.   Patient  encouraged to call with questions or concerns.    PMT will continue to support holistically.     No documented healthcare power of attorney or advanced directive at this time.  Patient is encouraged to take advantage of service here in the hospital in assistance with these documents , he declines at this time  Anchor:  DNR  Although there is no documented advanced directive the patient tells me his main goal for himself is  to allow for a natural death.     Psycho-social/Spiritual:   Desire for further Chaplaincy support:yes Emotional support offered.  Created space and opportunity for patient to share his current medical and social situation.  He tells me that he drove here from [place] several months ago in hopes of finding a place to live quickly.  However the patient has had a difficult time finding housing "within his budget".  He does have his son/lives local who he specifically requests not to contact  Discussed the reality that ultimately the patient is responsible for his overall wellness.  Prognosis:   Unable to determine  Discharge Planning: To Be Determined      Primary Diagnoses: Present on Admission: . COPD exacerbation ([place])   I have reviewed the medical record, interviewed the patient and family, and examined the patient. The following aspects are pertinent.  Past Medical History:  Diagnosis Date  . ABSCESS 12/03/2009  . ABSCESS, FINGER 04/07/2010  . ANXIETY 11/03/2009  . ASTHMA 11/03/2009  . CHRONIC OBSTRUCTIVE PULMONARY DISEASE, ACUTE EXACERBATION 11/03/2009  . Colon cancer ([place])   . COPD 11/03/2009  . DEPRESSION 11/03/2009  . DIABETES MELLITUS, TYPE II 11/03/2009  . [place] DISEASE, LUMBAR 11/03/2009  . EMPHYSEMA, BULLOUS 11/03/2009  . GERD 11/03/2009  . HYPERLIPIDEMIA 11/03/2009  .  HYPERTENSION 11/03/2009  . Kidney stones 01/30/12   "I've had them 7 times; always have passed them"  . PEPTIC ULCER DISEASE 11/03/2009  . Pneumonia   . RASH-NONVESICULAR 11/03/2009  . RESTLESS LEG SYNDROME 11/03/2009  . Shortness of breath    "sometimes; at any time"  . SPINAL STENOSIS, LUMBAR 11/03/2009   Social History   Socioeconomic History  . Marital status: Widowed    Spouse name: Not on file  . Number of children: 2  . Years of education: Not on file  . Highest education level: Not on file  Occupational History    Employer: RETIRED  Social Needs  . Financial resource strain: Patient refused  . Food insecurity:    Worry: Patient refused    Inability: Patient refused  . Transportation needs:    Medical: Patient refused    Non-medical: Patient refused  Tobacco Use  . Smoking status: Current Every Day Smoker    Packs/day: 1.00    Years: 41.00    Pack years: 41.00    Types: Cigarettes  . Smokeless tobacco: Never Used  . Tobacco comment: "stopped smoking 04/21/1991 then restarted in 2012"  Substance and Sexual Activity  . Alcohol use: No  . Drug use: No  . Sexual activity: Not Currently    Birth control/protection: None  Lifestyle  . Physical activity:    Days per week: Patient refused    Minutes per session: Patient refused  . Stress: Patient refused  Relationships  . Social connections:    Talks on phone: Patient refused    Gets together: Patient refused    Attends religious service: Patient refused    Active member of club or organization: Patient refused    Attends meetings of clubs or organizations: Patient refused    Relationship status: Patient refused  Other Topics Concern  . Not on file  Social History Narrative  . Not on file   Family History  Problem Relation Age of Onset  . Heart disease Father   . Heart disease Mother   . Cancer Brother        lung   Scheduled Meds: . aspirin  81 mg Oral Daily  . atorvastatin  40 mg Oral Daily  . enoxaparin  (LOVENOX) injection  40 mg Subcutaneous Q24H  . insulin aspart  0-5 Units Subcutaneous QHS  . insulin aspart  0-9 Units Subcutaneous TID WC  . methylPREDNISolone (SOLU-MEDROL) injection  60 mg Intravenous Daily  . midodrine  5 mg Oral TID WC  . mometasone-formoterol  2 puff Inhalation BID  . nicotine  14 mg Transdermal Daily  . tamsulosin  0.4 mg Oral Daily   Continuous Infusions: PRN Meds:.acetaminophen **OR** acetaminophen,  ipratropium-albuterol, ondansetron **OR** ondansetron (ZOFRAN) IV, polyethylene glycol Medications Prior to Admission:  Prior to Admission medications   Medication Sig Start Date End Date Taking? Authorizing Provider  albuterol (PROVENTIL HFA;VENTOLIN HFA) 108 (90 Base) MCG/ACT inhaler Inhale 2 puffs into the lungs every 6 (six) hours as needed. 05/24/18  Yes Salary, Avel Peace, MD  aspirin 81 MG chewable tablet Chew 1 tablet daily by mouth. 12/05/12  Yes [provider]  atorvastatin (LIPITOR) 40 MG tablet Take 1 tablet (40 mg total) by mouth daily. 12/16/16  Yes Mody, Ulice Bold, MD  Fluticasone-Salmeterol (ADVAIR) 250-50 MCG/DOSE AEPB Inhale 1 puff into the lungs 2 (two) times daily. 06/07/18  Yes Quintella Reichert, MD  metFORMIN (GLUCOPHAGE) 500 MG tablet Take 500 mg by mouth 2 (two) times daily with a meal.   Yes [provider]  tamsulosin (FLOMAX) 0.4 MG CAPS capsule Take 0.4 mg daily by mouth.   Yes [provider]  doxycycline (VIBRAMYCIN) 100 MG capsule Take 1 capsule (100 mg total) by mouth 2 (two) times daily. Patient not taking: Reported on 06/17/2018 06/07/18   Quintella Reichert, MD  ipratropium-albuterol (DUONEB) 0.5-2.5 (3) MG/3ML SOLN Take 3 mLs by nebulization every 6 (six) hours as needed (as needed for shortness of breath). 03/26/17   Arrien, Jimmy Picket, MD  midodrine (PROAMATINE) 5 MG tablet Take 5 mg by mouth 3 (three) times daily with meals.  05/11/18   [provider]  nitroGLYCERIN (NITROSTAT) 0.4 MG SL tablet Place 1 tablet  as needed under the tongue. 04/16/17   [provider]  nystatin (MYCOSTATIN/NYSTOP) powder Apply topically 4 (four) times daily. Patient not taking: Reported on 06/17/2018 05/20/18   Loney Hering, MD  predniSONE (DELTASONE) 10 MG tablet Take 4 tablets (40 mg total) by mouth daily. Patient not taking: Reported on 06/17/2018 06/07/18   Quintella Reichert, MD   No Known Allergies Review of Systems  Respiratory: Positive for shortness of breath.   Neurological: Positive for weakness.    Physical Exam  Constitutional: He is oriented to person, place, and time. He appears well-developed.  Pulmonary/Chest: Effort normal and breath sounds normal.  Abdominal: Soft.  Musculoskeletal: He exhibits edema.  Neurological: He is alert and oriented to person, place, and time.  Skin: Skin is warm and dry.    Vital Signs: BP 122/69 (BP Location: Left Arm)   Pulse 91   Temp (!) 97.5 F (36.4 C) (Oral)   Resp 17   Ht 5\' 11"  (1.803 m)   Wt 78.6 kg   SpO2 92%   BMI 24.17 kg/m  Pain Scale: 0-10   Pain Score: 0-No pain   SpO2: SpO2: 92 % O2 Device:SpO2: 92 % O2 Flow Rate: .O2 Flow Rate (L/min): 2 L/min  IO: Intake/output summary:   Intake/Output Summary (Last 24 hours) at 06/19/2018 0747 Last data filed at 06/18/2018 1700 Gross per 24 hour  Intake 960 ml  Output -  Net 960 ml    LBM: Last BM Date: 06/18/18 Baseline Weight: Weight: 76.2 kg Most recent weight: Weight: 78.6 kg     Palliative Assessment/Data:  80%   Discussed with Dr Tressia Miners  Time In: 0900 Time Out: 1015 Time Total: 75 minutes Greater than 50%  of this time was spent counseling and coordinating care related to the above assessment and plan.  Signed by: Wadie Lessen, NP   Please contact Palliative Medicine Team phone at [phone] for questions and concerns.  For individual provider: See Shea Evans

## 2018-06-20 LAB — BASIC METABOLIC PANEL
ANION GAP: 9 (ref 5–15)
BUN: 23 mg/dL (ref 8–23)
CALCIUM: 8.9 mg/dL (ref 8.9–10.3)
CO2: 26 mmol/L (ref 22–32)
Chloride: 105 mmol/L (ref 98–111)
Creatinine, Ser: 0.81 mg/dL (ref 0.61–1.24)
GLUCOSE: 166 mg/dL — AB (ref 70–99)
POTASSIUM: 4.1 mmol/L (ref 3.5–5.1)
SODIUM: 140 mmol/L (ref 135–145)

## 2018-06-20 LAB — GLUCOSE, CAPILLARY
GLUCOSE-CAPILLARY: 81 mg/dL (ref 70–99)
GLUCOSE-CAPILLARY: 187 mg/dL — AB (ref 70–99)

## 2018-06-20 MED ORDER — METOPROLOL TARTRATE 50 MG PO TABS
50.0000 mg | ORAL_TABLET | Freq: Two times a day (BID) | ORAL | Status: DC
  Administered 2018-06-20 (×2): 50 mg via ORAL
  Filled 2018-06-20 (×2): qty 1

## 2018-06-20 MED ORDER — METOPROLOL TARTRATE 50 MG PO TABS: 50.0000 mg | ORAL_TABLET | Freq: Two times a day (BID) | ORAL | 2 refills | Status: DC

## 2018-06-20 MED ORDER — FUROSEMIDE 20 MG PO TABS: 20.0000 mg | ORAL_TABLET | Freq: Every day | ORAL | 0 refills | Status: DC | PRN

## 2018-06-20 MED ORDER — PREDNISONE 50 MG PO TABS: 50.0000 mg | ORAL_TABLET | Freq: Every day | ORAL | 0 refills | Status: AC

## 2018-06-20 NOTE — Care Management Important Message (Signed)
Important Message  Patient Details  Name: [name] [name] MRN: [id number] Date of Birth: 05-17-1934   Medicare Important Message Given:  Yes    [name] 06/20/2018, 11:42 AM

## 2018-06-20 NOTE — Discharge Summary (Signed)
[facility] NAME: [name] [name]    MR#:  [id number]  DATE OF BIRTH:  10/10/1933  DATE OF ADMISSION:  06/17/2018   ADMITTING PHYSICIAN: Sela Hua, MD  DATE OF DISCHARGE:  06/20/18  PRIMARY CARE PHYSICIAN: System, Pcp Not In   ADMISSION DIAGNOSIS:   Gait disturbance [R26.9] Peripheral edema [R60.9] Near syncope [R55]  DISCHARGE DIAGNOSIS:   Active Problems:   COPD exacerbation (HCC)   Congestive heart failure ([place])   Palliative care by specialist   DNR (do not resuscitate)   SECONDARY DIAGNOSIS:   Past Medical History:  Diagnosis Date  . ABSCESS 12/03/2009  . ABSCESS, FINGER 04/07/2010  . ANXIETY 11/03/2009  . ASTHMA 11/03/2009  . CHRONIC OBSTRUCTIVE PULMONARY DISEASE, ACUTE EXACERBATION 11/03/2009  . Colon cancer ([place])   . COPD 11/03/2009  . DEPRESSION 11/03/2009  . DIABETES MELLITUS, TYPE II 11/03/2009  . [place] DISEASE, LUMBAR 11/03/2009  . EMPHYSEMA, BULLOUS 11/03/2009  . GERD 11/03/2009  . HYPERLIPIDEMIA 11/03/2009  . HYPERTENSION 11/03/2009  . Kidney stones 01/30/12   "I've had them 7 times; always have passed them"  . PEPTIC ULCER DISEASE 11/03/2009  . Pneumonia   . RASH-NONVESICULAR 11/03/2009  . RESTLESS LEG SYNDROME 11/03/2009  . Shortness of breath    "sometimes; at any time"  . SPINAL STENOSIS, LUMBAR 11/03/2009    HOSPITAL COURSE:   84 year old male with past medical history significant for hypertension, hyperlipidemia, lumbar spinal stenosis, COPD, ongoing smoking who is homeless at the current time presents to hospital secondary to difficulty breathing  1.  Acute on chronic COPD exacerbation- -received IV steroids, and requiring 2 L oxygen.   -Off oxygen this morning  -Patient using a portable oxygen concentrator as outpatient as he has been traveling. -Appreciate Palliative care consultation for goals of care-outpatient  palliative follow-up recommended -Nebs and inhalers -Chest x-ray with no  infection.  Not on any antibiotics. -Discharged on oral steroids  2.  Ankle edema-known history of CHF.  Echocardiogram showing LVH and EF of 89% and mild diastolic dysfunction -Due to orthostatic hypotension, discontinued Lasix-received IV fluids and hypotension resolved. -Lasix as needed  3 dizziness-secondary to orthostatic hypotension.  -Chest x-ray with no pulmonary edema.   -On Midodrine-takes it at home as well. -Improved dizziness after fluids  4.  Tobacco use disorder-strongly counseled  5.    Sinus tachycardia-patient had an episode of sinus tach/atrial arrhythmia last night on telemetry monitor.  He was asymptomatic.  Oral metoprolol resolved and heart rate is back to normal -Will be discharged on oral metoprolol.  PT consulted-outpatient PT at discharge  DISCHARGE CONDITIONS:   Guarded  CONSULTS OBTAINED:   None  DRUG ALLERGIES:   No Known Allergies DISCHARGE MEDICATIONS:   Allergies as of 06/20/2018   No Known Allergies     Medication List    STOP taking these medications   doxycycline 100 MG capsule Commonly known as:  VIBRAMYCIN   nitroGLYCERIN 0.4 MG SL tablet Commonly known as:  NITROSTAT   nystatin powder Commonly known as:  MYCOSTATIN/NYSTOP     TAKE these medications   albuterol 108 (90 Base) MCG/ACT inhaler Commonly known as:  PROVENTIL HFA;VENTOLIN HFA Inhale 2 puffs into the lungs every 6 (six) hours as needed.   aspirin 81 MG chewable tablet Chew 1 tablet daily by mouth.   atorvastatin 40 MG tablet Commonly known as:  LIPITOR Take 1 tablet (40 mg total) by mouth daily.   Fluticasone-Salmeterol  250-50 MCG/DOSE Aepb Commonly known as:  ADVAIR Inhale 1 puff into the lungs 2 (two) times daily.   furosemide 20 MG tablet Commonly known as:  LASIX Take 1 tablet (20 mg total) by mouth daily as needed for fluid or edema.   ipratropium-albuterol 0.5-2.5 (3) MG/3ML Soln Commonly known as:  DUONEB Take 3 mLs by nebulization every  6 (six) hours as needed (as needed for shortness of breath).   metFORMIN 500 MG tablet Commonly known as:  GLUCOPHAGE Take 500 mg by mouth 2 (two) times daily with a meal.   metoprolol tartrate 50 MG tablet Commonly known as:  LOPRESSOR Take 1 tablet (50 mg total) by mouth 2 (two) times daily.   midodrine 5 MG tablet Commonly known as:  PROAMATINE Take 5 mg by mouth 3 (three) times daily with meals.   predniSONE 50 MG tablet Commonly known as:  DELTASONE Take 1 tablet (50 mg total) by mouth daily for 5 days. What changed:    medication strength  how much to take   tamsulosin 0.4 MG Caps capsule Commonly known as:  FLOMAX Take 0.4 mg daily by mouth.        DISCHARGE INSTRUCTIONS:   1. PCP f/u in 1-2 weeks  DIET:   Cardiac diet  ACTIVITY:   Activity as tolerated  OXYGEN:   Home Oxygen: No.  Oxygen Delivery: room air  DISCHARGE LOCATION:   home   If you experience worsening of your admission symptoms, develop shortness of breath, life threatening emergency, suicidal or homicidal thoughts you must seek medical attention immediately by calling 911 or calling your MD immediately  if symptoms less severe.  You Must read complete instructions/literature along with all the possible adverse reactions/side effects for all the Medicines you take and that have been prescribed to you. Take any new Medicines after you have completely understood and accpet all the possible adverse reactions/side effects.   Please note  You were cared for by a hospitalist during your hospital stay. If you have any questions about your discharge medications or the care you received while you were in the hospital after you are discharged, you can call the unit and asked to speak with the hospitalist on call if the hospitalist that took care of you is not available. Once you are discharged, your primary care physician will handle any further medical issues. Please note that NO REFILLS for any  discharge medications will be authorized once you are discharged, as it is imperative that you return to your primary care physician (or establish a relationship with a primary care physician if you do not have one) for your aftercare needs so that they can reassess your need for medications and monitor your lab values.    On the day of Discharge:  VITAL SIGNS:   Blood pressure (!) 144/74, pulse 73, temperature 97.9 F (36.6 C), temperature source Oral, resp. rate 19, height 5\' 11"  (1.803 m), weight 78.6 kg, SpO2 95 %.  PHYSICAL EXAMINATION:    GENERAL:  84 y.o.-year-old patient lying in the bed with no acute distress.  EYES: Pupils equal, round, reactive to light and accommodation. No scleral icterus. Extraocular muscles intact.  HEENT: Head atraumatic, normocephalic. Oropharynx and nasopharynx clear.  NECK:  Supple, no jugular venous distention. No thyroid enlargement, no tenderness.  LUNGS: Decreased air movement bilaterally, no wheezing, rales,rhonchi or crepitation. No use of accessory muscles of respiration.  Decreased bibasilar breath sounds CARDIOVASCULAR: S1, S2 normal. No murmurs, rubs, or gallops.  ABDOMEN: Soft, nontender, nondistended. Bowel sounds present. No organomegaly or mass.  EXTREMITIES: No cyanosis, or clubbing.  1+ ankle edema NEUROLOGIC: Cranial nerves II through XII are intact. Muscle strength 5/5 in all extremities. Sensation intact. Gait not checked.  PSYCHIATRIC: The patient is alert and oriented x 3.  SKIN: No obvious rash, lesion, or ulcer.   DATA REVIEW:   CBC Recent Labs  Lab 06/18/18 0346  WBC 7.2  HGB 12.5*  HCT 37.0*  PLT 200    Chemistries  Recent Labs  Lab 06/17/18 1602  06/20/18 0326  NA 141   < > 140  K 3.6   < > 4.1  CL 106   < > 105  CO2 28   < > 26  GLUCOSE 102*   < > 166*  BUN 19   < > 23  CREATININE 0.96   < > 0.81  CALCIUM 8.8*   < > 8.9  AST 16  --   --   ALT 14  --   --   ALKPHOS 50  --   --   BILITOT 1.1  --   --     < > = values in this interval not displayed.     Microbiology Results  Results for orders placed or performed during the hospital encounter of 02/21/17  Blood culture (routine x 2)     Status: Abnormal   Collection Time: 02/21/17  8:05 AM  Result Value Ref Range Status   Specimen Description BLOOD L HAND  Final   Special Requests   Final    BOTTLES DRAWN AEROBIC AND ANAEROBIC Blood Culture adequate volume   Culture  Setup Time   Final    GRAM POSITIVE COCCI ANAEROBIC BOTTLE ONLY CRITICAL RESULT CALLED TO, READ BACK BY AND VERIFIED WITH: CHRISTINE KATSOUDAS 02/22/17 0737 SGD    Culture (A)  Final    STAPHYLOCOCCUS SPECIES (COAGULASE NEGATIVE) THE SIGNIFICANCE OF ISOLATING THIS ORGANISM FROM A SINGLE SET OF BLOOD CULTURES WHEN MULTIPLE SETS ARE DRAWN IS UNCERTAIN. PLEASE NOTIFY THE MICROBIOLOGY DEPARTMENT WITHIN ONE WEEK IF SPECIATION AND SENSITIVITIES ARE REQUIRED. Performed at [facility], [place] [address], [place], [place] [address]    Report Status 02/24/2017 FINAL  Final  Blood culture (routine x 2)     Status: None   Collection Time: 02/21/17  8:05 AM  Result Value Ref Range Status   Specimen Description BLOOD R ARM  Final   Special Requests   Final    BOTTLES DRAWN AEROBIC AND ANAEROBIC Blood Culture results may not be optimal due to an excessive volume of blood received in culture bottles   Culture NO GROWTH 5 DAYS  Final   Report Status 02/26/2017 FINAL  Final  Blood Culture ID Panel (Reflexed)     Status: Abnormal   Collection Time: 02/21/17  8:05 AM  Result Value Ref Range Status   Enterococcus species NOT DETECTED NOT DETECTED Final   Listeria monocytogenes NOT DETECTED NOT DETECTED Final   Staphylococcus species DETECTED (A) NOT DETECTED Final    Comment: Methicillin (oxacillin) susceptible coagulase negative staphylococcus. Possible blood culture contaminant (unless isolated from more than one blood culture draw or clinical case suggests pathogenicity). No  antibiotic treatment is indicated for blood  culture contaminants. CRITICAL RESULT CALLED TO, READ BACK BY AND VERIFIED WITH: CHRISTINE KATSOUDAS 02/22/17 0737 SGD    Staphylococcus aureus NOT DETECTED NOT DETECTED Final   Methicillin resistance NOT DETECTED NOT DETECTED Final   Streptococcus species  NOT DETECTED NOT DETECTED Final   Streptococcus agalactiae NOT DETECTED NOT DETECTED Final   Streptococcus pneumoniae NOT DETECTED NOT DETECTED Final   Streptococcus pyogenes NOT DETECTED NOT DETECTED Final   Acinetobacter baumannii NOT DETECTED NOT DETECTED Final   Enterobacteriaceae species NOT DETECTED NOT DETECTED Final   Enterobacter cloacae complex NOT DETECTED NOT DETECTED Final   Escherichia coli NOT DETECTED NOT DETECTED Final   Klebsiella oxytoca NOT DETECTED NOT DETECTED Final   Klebsiella pneumoniae NOT DETECTED NOT DETECTED Final   Proteus species NOT DETECTED NOT DETECTED Final   Serratia marcescens NOT DETECTED NOT DETECTED Final   Haemophilus influenzae NOT DETECTED NOT DETECTED Final   Neisseria meningitidis NOT DETECTED NOT DETECTED Final   Pseudomonas aeruginosa NOT DETECTED NOT DETECTED Final   Candida albicans NOT DETECTED NOT DETECTED Final   Candida glabrata NOT DETECTED NOT DETECTED Final   Candida krusei NOT DETECTED NOT DETECTED Final   Candida parapsilosis NOT DETECTED NOT DETECTED Final   Candida tropicalis NOT DETECTED NOT DETECTED Final  MRSA PCR Screening     Status: None   Collection Time: 02/21/17 10:07 AM  Result Value Ref Range Status   MRSA by PCR NEGATIVE NEGATIVE Final    Comment:        The GeneXpert MRSA Assay (FDA approved for NASAL specimens only), is one component of a comprehensive MRSA colonization surveillance program. It is not intended to diagnose MRSA infection nor to guide or monitor treatment for MRSA infections.     RADIOLOGY:  No results found.   Management plans discussed with the patient, family and they are in  agreement.  CODE STATUS:     Code Status Orders  (From admission, onward)         Start     Ordered   06/17/18 2236  Do not attempt resuscitation (DNR)  Continuous    Question Answer Comment  In the event of cardiac or respiratory ARREST Do not call a "code blue"   In the event of cardiac or respiratory ARREST Do not perform Intubation, CPR, defibrillation or ACLS   In the event of cardiac or respiratory ARREST Use medication by any route, position, wound care, and other measures to relive pain and suffering. May use oxygen, suction and manual treatment of airway obstruction as needed for comfort.   Comments nurse may pronounce      06/17/18 2235        Code Status History    Date Active Date Inactive Code Status Order ID Comments User Context   05/20/2018 1232 05/24/2018 1803 DNR [id number]  Loletha Grayer, MD ED   03/24/2017 0552 03/26/2017 1600 DNR [id number]  Norval Morton, MD ED   02/21/2017 0908 02/21/2017 1510 DNR [id number]  Dustin Flock, MD Inpatient   01/15/2017 2243 01/17/2017 1512 Full Code [id number]  Saundra Shelling, MD ED   12/15/2016 2054 12/16/2016 2121 Full Code [id number]  Dustin Flock, MD Inpatient   12/15/2016 1840 12/15/2016 2054 DNR [id number]  Dustin Flock, MD ED   Aug 23, 202018 2122 11/02/2016 1740 DNR [id number]  Loletha Grayer, MD ED   03/17/2012 0542 03/19/2012 2014 Full Code [id number]  Capes, Julieta Gutting, RN Inpatient   01/30/2012 1656 02/03/2012 1859 Full Code [id number]  Murlean Iba, MD Inpatient   08/11/2011 1803 08/13/2011 1835 Full Code [id number]  Loyal Gambler, RN Inpatient      TOTAL TIME TAKING CARE OF THIS PATIENT: 38 minutes.    Gladstone Lighter M.D on 06/20/2018 at 10:25 AM  Between 7am to 6pm - Pager - [phone]  After 6pm go to [email or web address] - Proofreader  [facility]  Office  [phone]  CC: Primary care physician; System, Pcp Not In   Note: This dictation was prepared with Dragon dictation along  with smaller phrase technology. Any transcriptional errors that result from this process are unintentional.

## 2018-06-20 NOTE — Progress Notes (Signed)
Patient has o2 concentrator hooked up in his car

## 2018-06-20 NOTE — Progress Notes (Signed)
Received Md order to discharge patient to home, reviewed home meds , prescriptions,  discharge instructions and follow up appointments with patient and patient verbalized understanding

## 2018-07-25 ENCOUNTER — Inpatient Hospital Stay
Admission: EM | Admit: 2018-07-25 | Discharge: 2018-07-29 | DRG: 190 | Disposition: A | Discharge status: Home / Self Care | Payer: Medicare PPO | Attending: Family Medicine | Admitting: Family Medicine

## 2018-07-25 ENCOUNTER — Other Ambulatory Visit: Payer: Self-pay

## 2018-07-25 ENCOUNTER — Encounter: Payer: Self-pay | Admitting: Emergency Medicine

## 2018-07-25 ENCOUNTER — Emergency Department: Payer: Medicare PPO

## 2018-07-25 DIAGNOSIS — F419 Anxiety disorder, unspecified: Secondary | ICD-10-CM | POA: Diagnosis present

## 2018-07-25 DIAGNOSIS — Z7984 Long term (current) use of oral hypoglycemic drugs: Secondary | ICD-10-CM

## 2018-07-25 DIAGNOSIS — Z590 Homelessness: Secondary | ICD-10-CM

## 2018-07-25 DIAGNOSIS — F17210 Nicotine dependence, cigarettes, uncomplicated: Secondary | ICD-10-CM | POA: Diagnosis present

## 2018-07-25 DIAGNOSIS — Z7982 Long term (current) use of aspirin: Secondary | ICD-10-CM

## 2018-07-25 DIAGNOSIS — E785 Hyperlipidemia, unspecified: Secondary | ICD-10-CM | POA: Diagnosis present

## 2018-07-25 DIAGNOSIS — J441 Chronic obstructive pulmonary disease with (acute) exacerbation: Secondary | ICD-10-CM | POA: Diagnosis not present

## 2018-07-25 DIAGNOSIS — F329 Major depressive disorder, single episode, unspecified: Secondary | ICD-10-CM | POA: Diagnosis present

## 2018-07-25 DIAGNOSIS — Z66 Do not resuscitate: Secondary | ICD-10-CM | POA: Diagnosis present

## 2018-07-25 DIAGNOSIS — J9621 Acute and chronic respiratory failure with hypoxia: Secondary | ICD-10-CM | POA: Diagnosis not present

## 2018-07-25 DIAGNOSIS — Z85038 Personal history of other malignant neoplasm of large intestine: Secondary | ICD-10-CM

## 2018-07-25 DIAGNOSIS — I10 Essential (primary) hypertension: Secondary | ICD-10-CM | POA: Diagnosis present

## 2018-07-25 DIAGNOSIS — Z7951 Long term (current) use of inhaled steroids: Secondary | ICD-10-CM

## 2018-07-25 DIAGNOSIS — Z79899 Other long term (current) drug therapy: Secondary | ICD-10-CM

## 2018-07-25 DIAGNOSIS — E119 Type 2 diabetes mellitus without complications: Secondary | ICD-10-CM | POA: Diagnosis present

## 2018-07-25 LAB — CBC
HEMATOCRIT: 46.6 % (ref 39.0–52.0)
Hemoglobin: 14.2 g/dL (ref 13.0–17.0)
MCH: 29.2 pg (ref 26.0–34.0)
MCHC: 30.5 g/dL (ref 30.0–36.0)
MCV: 95.7 fL (ref 80.0–100.0)
NRBC: 0 % (ref 0.0–0.2)
Platelets: 219 10*3/uL (ref 150–400)
RBC: 4.87 MIL/uL (ref 4.22–5.81)
RDW: 16.1 % — AB (ref 11.5–15.5)
WBC: 10.4 10*3/uL (ref 4.0–10.5)

## 2018-07-25 LAB — BASIC METABOLIC PANEL
ANION GAP: 12 (ref 5–15)
BUN: 17 mg/dL (ref 8–23)
CHLORIDE: 105 mmol/L (ref 98–111)
CO2: 27 mmol/L (ref 22–32)
CREATININE: 0.84 mg/dL (ref 0.61–1.24)
Calcium: 9.5 mg/dL (ref 8.9–10.3)
GFR calc non Af Amer: >60 mL/min (ref >60)
Glucose, Bld: 127 mg/dL — ABNORMAL HIGH (ref 70–99)
POTASSIUM: 4 mmol/L (ref 3.5–5.1)
SODIUM: 144 mmol/L (ref 135–145)

## 2018-07-25 LAB — BRAIN NATRIURETIC PEPTIDE: B NATRIURETIC PEPTIDE 5: 71 pg/mL (ref 0.0–100.0)

## 2018-07-25 LAB — TROPONIN I: Troponin I: <0.03 ng/mL (ref <0.03)

## 2018-07-25 MED ORDER — ALBUTEROL SULFATE (2.5 MG/3ML) 0.083% IN NEBU
2.5000 mg | INHALATION_SOLUTION | Freq: Once | RESPIRATORY_TRACT | Status: AC
  Administered 2018-07-25: 2.5 mg via RESPIRATORY_TRACT
  Filled 2018-07-25: qty 3

## 2018-07-25 MED ORDER — METHYLPREDNISOLONE SODIUM SUCC 125 MG IJ SOLR
125.0000 mg | INTRAMUSCULAR | Status: AC
  Administered 2018-07-25: 125 mg via INTRAVENOUS
  Filled 2018-07-25: qty 2

## 2018-07-25 MED ORDER — ALBUTEROL SULFATE (2.5 MG/3ML) 0.083% IN NEBU
5.0000 mg | INHALATION_SOLUTION | Freq: Once | RESPIRATORY_TRACT | Status: AC
  Administered 2018-07-25: 5 mg via RESPIRATORY_TRACT
  Filled 2018-07-25: qty 6

## 2018-07-25 MED ORDER — SODIUM CHLORIDE 0.9 % IV SOLN
500.0000 mg | Freq: Once | INTRAVENOUS | Status: AC
  Administered 2018-07-25: 500 mg via INTRAVENOUS
  Filled 2018-07-25: qty 500

## 2018-07-25 MED ORDER — IPRATROPIUM-ALBUTEROL 0.5-2.5 (3) MG/3ML IN SOLN
3.0000 mL | Freq: Once | RESPIRATORY_TRACT | Status: AC
  Administered 2018-07-25: 3 mL via RESPIRATORY_TRACT
  Filled 2018-07-25: qty 3

## 2018-07-25 NOTE — ED Notes (Signed)
Patient placed on 3LNC due to oxygen saturations 80% while sleeping.

## 2018-07-25 NOTE — ED Notes (Signed)
Pt c/o pain and burning at infusion site. No redness, swelling, or other indications infiltration noted. IV rate decreased to 199mL/hr for pt comfort.

## 2018-07-25 NOTE — ED Triage Notes (Signed)
Pt in via POV with complaints of increasing shortness of breath and productive cough over the last two days.  Vitals WDL at this time.

## 2018-07-25 NOTE — ED Provider Notes (Signed)
[facility] Emergency Department Provider Note   ____________________________________________   First MD Initiated Contact with Patient 07/25/18 2059     (approximate)  I have reviewed the triage vital signs and the nursing notes.   HISTORY  Chief Complaint Shortness of Breath    HPI [name] [name] is a 84 y.o. male presents for evaluation of shortness of breath and wheezing  Patient reports for about 2 to 3 days now he said dry slightly productive cough at times.  Wheezing.  Does not use any breathing treatments at home.  No fevers or chills.  Reports increased shortness of breath with coughing and wheezing, especially worse when he walks more than 10 feet.  No chest pain.  No abdominal pain.  No leg swelling.  Reports similar symptoms about a month ago.  He also tells me upfront that he does not wish to be resuscitated, if his breathing were to stop or his heart stopped he said he does not wish to be resuscitated.  Reports he also spoke recently with his physician in [place] who told him he did not have long to go because of his severe COPD and lung problems.   Past Medical History:  Diagnosis Date  . ABSCESS 12/03/2009  . ABSCESS, FINGER 04/07/2010  . ANXIETY 11/03/2009  . ASTHMA 11/03/2009  . CHRONIC OBSTRUCTIVE PULMONARY DISEASE, ACUTE EXACERBATION 11/03/2009  . Colon cancer ([place])   . COPD 11/03/2009  . DEPRESSION 11/03/2009  . DIABETES MELLITUS, TYPE II 11/03/2009  . [place] DISEASE, LUMBAR 11/03/2009  . EMPHYSEMA, BULLOUS 11/03/2009  . GERD 11/03/2009  . HYPERLIPIDEMIA 11/03/2009  . HYPERTENSION 11/03/2009  . Kidney stones 01/30/12   "I've had them 7 times; always have passed them"  . PEPTIC ULCER DISEASE 11/03/2009  . Pneumonia   . RASH-NONVESICULAR 11/03/2009  . RESTLESS LEG SYNDROME 11/03/2009  . Shortness of breath    "sometimes; at any time"  . SPINAL STENOSIS, LUMBAR 11/03/2009    Patient Active Problem List   Diagnosis Date Noted  . Congestive  heart failure ([place])   . Palliative care by specialist   . DNR (do not resuscitate)   . COPD exacerbation ([place]) 03/24/2017  . Hypoxia 03/24/2017  . Diabetes mellitus type 2 in nonobese ([place]) 03/24/2017  . Polysubstance abuse ([place]) 03/24/2017  . Pressure injury of skin 03/24/2017  . Overdose of benzodiazepine 01/16/2017  . Unresponsiveness   . HCAP (healthcare-associated pneumonia)   . Respiratory failure ([place]) 01/15/2017  . CVA (cerebral vascular accident) ([place]) 12/15/2016  . Chest pain 05-19-202018  . PNA (pneumonia) 03/17/2012  . Weakness generalized 03/17/2012  . Generalized weakness 01/30/2012  . Fall at home 01/30/2012  . Physical deconditioning 01/30/2012  . Nausea vomiting and diarrhea 01/15/2012  . UTI (urinary tract infection) 01/15/2012  . Cocaine abuse ([place]) 08/11/2011  . Tobacco abuse 08/11/2011  . Orthostasis 12/23/2010  . Dehydration 12/23/2010  . Abdominal pain, other specified site 12/23/2010  . Dizziness 12/23/2010  . Weight loss 12/23/2010  . Left lumbar radiculopathy 12/23/2010  . DIABETES MELLITUS, TYPE II 11/03/2009  . HYPERLIPIDEMIA 11/03/2009  . ANXIETY 11/03/2009  . DEPRESSION 11/03/2009  . RESTLESS LEG SYNDROME 11/03/2009  . HYPERTENSION 11/03/2009  . EMPHYSEMA, BULLOUS 11/03/2009  . ASTHMA 11/03/2009  . COPD 11/03/2009  . GERD 11/03/2009  . PEPTIC ULCER DISEASE 11/03/2009  . [place] DISEASE, LUMBAR 11/03/2009  . SPINAL STENOSIS, LUMBAR 11/03/2009  . NEPHROLITHIASIS, HX OF 11/03/2009    Past Surgical History:  Procedure Laterality Date  .  Colon cancer surgery    . [place]   left  . INGUINAL HERNIA REPAIR  10/2011   left  . ROTATOR CUFF REPAIR  2003   left  . TONSILLECTOMY  1960    Prior to Admission medications   Medication Sig Start Date End Date Taking? Authorizing Provider  albuterol (PROVENTIL HFA;VENTOLIN HFA) 108 (90 Base) MCG/ACT inhaler Inhale 2 puffs into the lungs every 6 (six) hours as needed. 05/24/18   Salary,  Holly Bodily D, MD  aspirin 81 MG chewable tablet Chew 1 tablet daily by mouth. 12/05/12   [provider]  atorvastatin (LIPITOR) 40 MG tablet Take 1 tablet (40 mg total) by mouth daily. 12/16/16   Bettey Costa, MD  Fluticasone-Salmeterol (ADVAIR) 250-50 MCG/DOSE AEPB Inhale 1 puff into the lungs 2 (two) times daily. 06/07/18   Quintella Reichert, MD  furosemide (LASIX) 20 MG tablet Take 1 tablet (20 mg total) by mouth daily as needed for fluid or edema. 06/20/18 07/20/18  Gladstone Lighter, MD  ipratropium-albuterol (DUONEB) 0.5-2.5 (3) MG/3ML SOLN Take 3 mLs by nebulization every 6 (six) hours as needed (as needed for shortness of breath). 03/26/17   Arrien, Jimmy Picket, MD  metFORMIN (GLUCOPHAGE) 500 MG tablet Take 500 mg by mouth 2 (two) times daily with a meal.    [provider]  metoprolol tartrate (LOPRESSOR) 50 MG tablet Take 1 tablet (50 mg total) by mouth 2 (two) times daily. 06/20/18   Gladstone Lighter, MD  midodrine (PROAMATINE) 5 MG tablet Take 5 mg by mouth 3 (three) times daily with meals.  05/11/18   [provider]  tamsulosin (FLOMAX) 0.4 MG CAPS capsule Take 0.4 mg daily by mouth.    [provider]    Allergies Patient has no known allergies.  Family History  Problem Relation Age of Onset  . Heart disease Father   . Heart disease Mother   . Cancer Brother        lung    Social History Social History   Tobacco Use  . Smoking status: Current Every Day Smoker    Packs/day: 1.00    Years: 41.00    Pack years: 41.00    Types: Cigarettes  . Smokeless tobacco: Never Used  . Tobacco comment: "stopped smoking 04/21/1991 then restarted in 2012"  Substance Use Topics  . Alcohol use: No  . Drug use: No    Review of Systems Constitutional: No fever/chills Eyes: No visual changes. ENT: No sore throat. Cardiovascular: Denies chest pain. Respiratory: See HPI gastrointestinal: No abdominal pain.   Genitourinary: Negative for  dysuria. Musculoskeletal: Negative for back pain. Skin: Negative for rash. Neurological: Negative for headaches.  Reports feels weak all over if he tries walking.  Denies any focal weakness.    ____________________________________________   PHYSICAL EXAM:  VITAL SIGNS: ED Triage Vitals [07/25/18 2020]  Enc Vitals Group     BP (!) 136/51     Pulse Rate 80     Resp (!) 24     Temp 98.1 F (36.7 C)     Temp Source Oral     SpO2 98 %     Weight 170 lb (77.1 kg)     Height 5\' 10"  (1.778 m)     Head Circumference      Peak Flow      Pain Score 0     Pain Loc      Pain Edu?      Excl. in [place]?  Constitutional: Alert and oriented.  Moderately ill-appearing, moderate increased work of breathing.  Sitting somewhat upright with mild use of accessory muscles. Eyes: Conjunctivae are normal. Head: Atraumatic. Nose: No congestion/rhinnorhea. Mouth/Throat: Mucous membranes are moist. Neck: No stridor.  Cardiovascular: Normal rate, regular rhythm. Grossly normal heart sounds.  Good peripheral circulation. Respiratory: There is mild tachypnea, use of accessory muscles.  Speaks in short phrases.  Diffuse end expiratory wheezing is present throughout without noted focal crackles. Gastrointestinal: Soft and nontender. No distention. Musculoskeletal: No lower extremity tenderness nor edema. Neurologic:  Normal speech and language. No gross focal neurologic deficits are appreciated.  Skin:  Skin is warm, dry and intact. No rash noted. Psychiatric: Mood and affect are normal. Speech and behavior are normal.  ____________________________________________   LABS (all labs ordered are listed, but only abnormal results are displayed)  Labs Reviewed  BASIC METABOLIC PANEL - Abnormal; Notable for the following components:      Result Value   Glucose, Bld 127 (*)    All other components within normal limits  CBC - Abnormal; Notable for the following components:   RDW 16.1 (*)    All other  components within normal limits  TROPONIN I  BRAIN NATRIURETIC PEPTIDE   ____________________________________________  EKG  ED ECG REPORT I, Delman Kitten, the attending physician, personally viewed and interpreted this ECG.  Date: 07/25/2018 EKG Time: 2020 Rate: 85 Rhythm: normal sinus rhythm QRS Axis: normal Intervals: normal ST/T Wave abnormalities: normal Narrative Interpretation: no evidence of acute ischemia  ____________________________________________  RADIOLOGY  Dg Chest 2 View  Result Date: 07/25/2018 CLINICAL DATA:  Increasing shortness of breath and productive cough over the last 2 days. EXAM: CHEST - 2 VIEW COMPARISON:  06/26/2018 FINDINGS: Mild cardiac enlargement. No vascular congestion. Prominent emphysematous changes in the lungs with scattered fibrosis. No airspace disease or consolidation. No blunting of costophrenic angles. No pneumothorax. Mediastinal contours appear intact. Degenerative changes in the spine. IMPRESSION: Severe emphysematous changes and chronic bronchitic changes in lungs. No evidence of active pulmonary disease. Electronically Signed   By: Lucienne Capers M.D.   On: 07/25/2018 21:07   Chest x-ray personally viewed by me, no pneumothorax or infiltrate denoted.  Severe structural lung disease ____________________________________________   PROCEDURES  Procedure(s) performed: None  Procedures  Critical Care performed: Yes, see critical care note(s)  CRITICAL CARE Performed by: Delman Kitten   Total critical care time: 37 minutes  Critical care time was exclusive of separately billable procedures and treating other patients.  Critical care was necessary to treat or prevent imminent or life-threatening deterioration.  Critical care was time spent personally by me on the following activities: development of treatment plan with patient and/or surrogate as well as nursing, discussions with consultants, evaluation of patient's response to  treatment, examination of patient, obtaining history from patient or surrogate, ordering and performing treatments and interventions, ordering and review of laboratory studies, ordering and review of radiographic studies, pulse oximetry and re-evaluation of patient's condition.  Patient with increased work of breathing requiring multiple nebulizer treatments, repeat evaluations and ongoing care including multiple doses of albuterol, IV steroid, IV antibiotic. ____________________________________________   INITIAL IMPRESSION / ASSESSMENT AND PLAN / ED COURSE  Pertinent labs & imaging results that were available during my care of the patient were reviewed by me and considered in my medical decision making (see chart for details).   Patient presents with evidence of increased work of breathing, accessory muscle use and diffuse end expiratory wheezing.  The patient's  clinical examination appears to be consistent with a COPD exacerbation.  His underlying cough and structural lung disease would suggest he is at elevated risk for pneumonia.  Because of the patient's increased work of breathing, we will treat with aggressive nebulizers, IV steroid, and I will initiate azithromycin therapy for concerns of COPD exacerbation.  Lab work is all pending at this time.  EKG reassuring, denies chest pain.  Clinical Course as of Jul 25 2341  Wed Jul 25, 2018  2217 Patient reexamined, his breathing is more comfortable but he continues to have diffuse end expiratory wheezing.  I have ordered additional albuterol this time.  He reports he is feeling better, his respirations have improved but he continues to use mild use of accessory muscles   [MQ]    Clinical Course User Index [MQ] Delman Kitten, MD    ----------------------------------------- 11:43 PM on 07/25/2018 -----------------------------------------  Patient resting more comfortably, but still notable and expiratory wheezing.  Respiratory rate is  improved but currently on 3 L nasal cannula with resting.  He did desat into the 80s with rest.  Because of the patient's ongoing wheezing, mild ongoing work of breathing will admit for further care and work-up.  Patient agreeable with plan.  Appears prolonged COPD exacerbation ____________________________________________   FINAL CLINICAL IMPRESSION(S) / ED DIAGNOSES  Final diagnoses:  COPD exacerbation ([place])        Note:  This document was prepared using Dragon voice recognition software and may include unintentional dictation errors       Delman Kitten, MD 07/25/18 2344

## 2018-07-25 NOTE — ED Notes (Signed)
Patient reports living in his car since August. When questioned about living situation, patient would not specify. Patient did state he takes his medications and has them in his vehicle.

## 2018-07-26 DIAGNOSIS — I10 Essential (primary) hypertension: Secondary | ICD-10-CM | POA: Diagnosis present

## 2018-07-26 DIAGNOSIS — F17210 Nicotine dependence, cigarettes, uncomplicated: Secondary | ICD-10-CM | POA: Diagnosis present

## 2018-07-26 DIAGNOSIS — J441 Chronic obstructive pulmonary disease with (acute) exacerbation: Secondary | ICD-10-CM | POA: Diagnosis present

## 2018-07-26 DIAGNOSIS — Z66 Do not resuscitate: Secondary | ICD-10-CM | POA: Diagnosis present

## 2018-07-26 DIAGNOSIS — Z7984 Long term (current) use of oral hypoglycemic drugs: Secondary | ICD-10-CM | POA: Diagnosis not present

## 2018-07-26 DIAGNOSIS — E119 Type 2 diabetes mellitus without complications: Secondary | ICD-10-CM | POA: Diagnosis present

## 2018-07-26 DIAGNOSIS — J9621 Acute and chronic respiratory failure with hypoxia: Secondary | ICD-10-CM | POA: Diagnosis not present

## 2018-07-26 DIAGNOSIS — Z85038 Personal history of other malignant neoplasm of large intestine: Secondary | ICD-10-CM | POA: Diagnosis not present

## 2018-07-26 DIAGNOSIS — F329 Major depressive disorder, single episode, unspecified: Secondary | ICD-10-CM | POA: Diagnosis present

## 2018-07-26 DIAGNOSIS — Z7982 Long term (current) use of aspirin: Secondary | ICD-10-CM | POA: Diagnosis not present

## 2018-07-26 DIAGNOSIS — E785 Hyperlipidemia, unspecified: Secondary | ICD-10-CM | POA: Diagnosis present

## 2018-07-26 DIAGNOSIS — Z590 Homelessness: Secondary | ICD-10-CM | POA: Diagnosis not present

## 2018-07-26 DIAGNOSIS — F419 Anxiety disorder, unspecified: Secondary | ICD-10-CM | POA: Diagnosis present

## 2018-07-26 DIAGNOSIS — Z008 Encounter for other general examination: Secondary | ICD-10-CM | POA: Diagnosis not present

## 2018-07-26 DIAGNOSIS — Z79899 Other long term (current) drug therapy: Secondary | ICD-10-CM | POA: Diagnosis not present

## 2018-07-26 DIAGNOSIS — Z7951 Long term (current) use of inhaled steroids: Secondary | ICD-10-CM | POA: Diagnosis not present

## 2018-07-26 LAB — BASIC METABOLIC PANEL
ANION GAP: 9 (ref 5–15)
BUN: 21 mg/dL (ref 8–23)
CALCIUM: 8.8 mg/dL — AB (ref 8.9–10.3)
CHLORIDE: 105 mmol/L (ref 98–111)
CO2: 29 mmol/L (ref 22–32)
Creatinine, Ser: 0.87 mg/dL (ref 0.61–1.24)
GFR calc Af Amer: >60 mL/min (ref >60)
GFR calc non Af Amer: >60 mL/min (ref >60)
GLUCOSE: 191 mg/dL — AB (ref 70–99)
Potassium: 4.1 mmol/L (ref 3.5–5.1)
Sodium: 143 mmol/L (ref 135–145)

## 2018-07-26 LAB — CBC
HEMATOCRIT: 39.2 % (ref 39.0–52.0)
Hemoglobin: 12.5 g/dL — ABNORMAL LOW (ref 13.0–17.0)
MCH: 29.3 pg (ref 26.0–34.0)
MCHC: 31.9 g/dL (ref 30.0–36.0)
MCV: 91.8 fL (ref 80.0–100.0)
NRBC: 0 % (ref 0.0–0.2)
PLATELETS: 212 10*3/uL (ref 150–400)
RBC: 4.27 MIL/uL (ref 4.22–5.81)
RDW: 16.1 % — ABNORMAL HIGH (ref 11.5–15.5)
WBC: 9.4 10*3/uL (ref 4.0–10.5)

## 2018-07-26 LAB — GLUCOSE, CAPILLARY
GLUCOSE-CAPILLARY: 262 mg/dL — AB (ref 70–99)
GLUCOSE-CAPILLARY: 181 mg/dL — AB (ref 70–99)
GLUCOSE-CAPILLARY: 186 mg/dL — AB (ref 70–99)
Glucose-Capillary: 171 mg/dL — ABNORMAL HIGH (ref 70–99)
Glucose-Capillary: 166 mg/dL — ABNORMAL HIGH (ref 70–99)

## 2018-07-26 LAB — HEMOGLOBIN A1C
HEMOGLOBIN A1C: 7.1 % — AB (ref 4.8–5.6)
MEAN PLASMA GLUCOSE: 157.07 mg/dL

## 2018-07-26 MED ORDER — INSULIN ASPART 100 UNIT/ML ~~LOC~~ SOLN
0.0000 [IU] | Freq: Three times a day (TID) | SUBCUTANEOUS | Status: DC
  Administered 2018-07-26: 13:00:00 5 [IU] via SUBCUTANEOUS
  Administered 2018-07-26 (×2): 2 [IU] via SUBCUTANEOUS
  Administered 2018-07-27: 3 [IU] via SUBCUTANEOUS
  Administered 2018-07-27 (×2): 2 [IU] via SUBCUTANEOUS
  Administered 2018-07-28: 3 [IU] via SUBCUTANEOUS
  Administered 2018-07-28: 17:00:00 2 [IU] via SUBCUTANEOUS
  Administered 2018-07-28 – 2018-07-29 (×2): 3 [IU] via SUBCUTANEOUS
  Administered 2018-07-29: 5 [IU] via SUBCUTANEOUS
  Filled 2018-07-26 (×11): qty 1

## 2018-07-26 MED ORDER — BISACODYL 5 MG PO TBEC: 5.0000 mg | DELAYED_RELEASE_TABLET | Freq: Every day | ORAL | Status: DC | PRN

## 2018-07-26 MED ORDER — ATORVASTATIN CALCIUM 20 MG PO TABS
40.0000 mg | ORAL_TABLET | Freq: Every day | ORAL | Status: DC
  Administered 2018-07-26 – 2018-07-28 (×3): 40 mg via ORAL
  Filled 2018-07-26 (×3): qty 2

## 2018-07-26 MED ORDER — SODIUM CHLORIDE 0.9 % IV SOLN
1.0000 g | INTRAVENOUS | Status: DC
  Administered 2018-07-26 – 2018-07-28 (×3): 1 g via INTRAVENOUS
  Filled 2018-07-26: qty 1
  Filled 2018-07-26: qty 10
  Filled 2018-07-26 (×2): qty 1

## 2018-07-26 MED ORDER — DOCUSATE SODIUM 100 MG PO CAPS
100.0000 mg | ORAL_CAPSULE | Freq: Two times a day (BID) | ORAL | Status: DC
  Administered 2018-07-26 – 2018-07-29 (×6): 100 mg via ORAL
  Filled 2018-07-26 (×7): qty 1

## 2018-07-26 MED ORDER — ONDANSETRON HCL 4 MG/2ML IJ SOLN: 4.0000 mg | Freq: Four times a day (QID) | INTRAMUSCULAR | Status: DC | PRN

## 2018-07-26 MED ORDER — TRAZODONE HCL 50 MG PO TABS
25.0000 mg | ORAL_TABLET | Freq: Every evening | ORAL | Status: DC | PRN
  Administered 2018-07-26 – 2018-07-27 (×2): 25 mg via ORAL
  Filled 2018-07-26 (×2): qty 1

## 2018-07-26 MED ORDER — FLUTICASONE FUROATE-VILANTEROL 200-25 MCG/INH IN AEPB
1.0000 | INHALATION_SPRAY | Freq: Every day | RESPIRATORY_TRACT | Status: DC
  Administered 2018-07-26 – 2018-07-29 (×4): 1 via RESPIRATORY_TRACT
  Filled 2018-07-26: qty 28

## 2018-07-26 MED ORDER — METHYLPREDNISOLONE SODIUM SUCC 125 MG IJ SOLR
80.0000 mg | Freq: Two times a day (BID) | INTRAMUSCULAR | Status: DC
  Administered 2018-07-26 – 2018-07-29 (×7): 80 mg via INTRAVENOUS
  Filled 2018-07-26 (×7): qty 2

## 2018-07-26 MED ORDER — ORAL CARE MOUTH RINSE
15.0000 mL | Freq: Two times a day (BID) | OROMUCOSAL | Status: DC
  Administered 2018-07-26 – 2018-07-29 (×5): 15 mL via OROMUCOSAL

## 2018-07-26 MED ORDER — SODIUM CHLORIDE 0.9 % IV SOLN
500.0000 mg | INTRAVENOUS | Status: DC
  Administered 2018-07-26: 22:00:00 500 mg via INTRAVENOUS
  Filled 2018-07-26 (×2): qty 500

## 2018-07-26 MED ORDER — HEPARIN SODIUM (PORCINE) 5000 UNIT/ML IJ SOLN
5000.0000 [IU] | Freq: Three times a day (TID) | INTRAMUSCULAR | Status: DC
  Administered 2018-07-26 – 2018-07-29 (×10): 5000 [IU] via SUBCUTANEOUS
  Filled 2018-07-26 (×10): qty 1

## 2018-07-26 MED ORDER — ACETAMINOPHEN 325 MG PO TABS: 650.0000 mg | ORAL_TABLET | Freq: Four times a day (QID) | ORAL | Status: DC | PRN

## 2018-07-26 MED ORDER — TAMSULOSIN HCL 0.4 MG PO CAPS
0.4000 mg | ORAL_CAPSULE | Freq: Every day | ORAL | Status: DC
  Administered 2018-07-26 – 2018-07-29 (×4): 0.4 mg via ORAL
  Filled 2018-07-26 (×9): qty 1

## 2018-07-26 MED ORDER — METOPROLOL TARTRATE 50 MG PO TABS
50.0000 mg | ORAL_TABLET | Freq: Two times a day (BID) | ORAL | Status: DC
  Administered 2018-07-26 – 2018-07-29 (×7): 50 mg via ORAL
  Filled 2018-07-26 (×7): qty 1

## 2018-07-26 MED ORDER — HYDROCODONE-ACETAMINOPHEN 5-325 MG PO TABS: 1.0000 | ORAL_TABLET | ORAL | Status: DC | PRN

## 2018-07-26 MED ORDER — IPRATROPIUM-ALBUTEROL 0.5-2.5 (3) MG/3ML IN SOLN
3.0000 mL | Freq: Four times a day (QID) | RESPIRATORY_TRACT | Status: DC
  Administered 2018-07-26 – 2018-07-29 (×14): 3 mL via RESPIRATORY_TRACT
  Filled 2018-07-26 (×14): qty 3

## 2018-07-26 MED ORDER — ASPIRIN 81 MG PO CHEW
81.0000 mg | CHEWABLE_TABLET | Freq: Every day | ORAL | Status: DC
  Administered 2018-07-26 – 2018-07-29 (×4): 81 mg via ORAL
  Filled 2018-07-26 (×4): qty 1

## 2018-07-26 MED ORDER — ACETAMINOPHEN 650 MG RE SUPP: 650.0000 mg | Freq: Four times a day (QID) | RECTAL | Status: DC | PRN

## 2018-07-26 MED ORDER — MIDODRINE HCL 5 MG PO TABS
5.0000 mg | ORAL_TABLET | Freq: Three times a day (TID) | ORAL | Status: DC
  Administered 2018-07-26 – 2018-07-29 (×11): 5 mg via ORAL
  Filled 2018-07-26 (×12): qty 1

## 2018-07-26 MED ORDER — ALBUTEROL SULFATE (2.5 MG/3ML) 0.083% IN NEBU: 2.5000 mg | INHALATION_SOLUTION | Freq: Four times a day (QID) | RESPIRATORY_TRACT | Status: DC | PRN

## 2018-07-26 MED ORDER — INSULIN ASPART 100 UNIT/ML ~~LOC~~ SOLN
0.0000 [IU] | Freq: Every day | SUBCUTANEOUS | Status: DC
  Administered 2018-07-27 – 2018-07-28 (×2): 2 [IU] via SUBCUTANEOUS
  Filled 2018-07-26 (×2): qty 1

## 2018-07-26 MED ORDER — ONDANSETRON HCL 4 MG PO TABS: 4.0000 mg | ORAL_TABLET | Freq: Four times a day (QID) | ORAL | Status: DC | PRN

## 2018-07-26 MED ORDER — FUROSEMIDE 40 MG PO TABS
20.0000 mg | ORAL_TABLET | Freq: Every day | ORAL | Status: DC | PRN
  Administered 2018-07-27: 20 mg via ORAL
  Filled 2018-07-26: qty 1

## 2018-07-26 NOTE — Clinical Social Work Note (Signed)
Clinical Social Work Assessment  Patient Details  Name: [name] [name] MRN: [id number] Date of Birth: 06-10-1934  Date of referral:  07/26/18               Reason for consult:  Housing Concerns/Homelessness                Permission sought to share information with:  Case Freight forwarder, Chartered certified accountant granted to share information::  Yes, Verbal Permission Granted  Name::        Agency::     Relationship::     Contact Information:     Housing/Transportation Living arrangements for the past 2 months:  Homeless Source of Information:  Patient Patient Interpreter Needed:  None Criminal Activity/Legal Involvement Pertinent to Current Situation/Hospitalization:  No - Comment as needed Significant Relationships:  Adult Children Lives with:  Self Do you feel safe going back to the place where you live?  Yes Need for family participation in patient care:  No ([place])  Care giving concerns:  Patient is homeless.    Social Worker assessment / plan:  CSW consulted for homelessness. CSW met with patient to discuss housing. Patient has been here multiple times in the past and has refused placement every time. CSW introduced self and explained role. Patient states that he has had 2 previous social workers "go behind his back and call his son after he asked them not to". Patient states that he does not social workers and does not trust this [name] [name]. CSW explained that his son would not be contacted by CSW unless patient is unable to make decisions. Patient reports that he lives in his car and has no plans of changing his current lifestyle. Per patient he has been living in his car since August of 2019. He moved here from [place] in August and lived with his son briefly but patient states he is unable to stay with his son any longer. At that time, patient began living in his car and he continues to do so. CSW offered shelter for patient but he states that he has been to shelters in the  past and does not want to go again. CSW did provide resources for patient. He has oxygen through [place] and is able to get that on his own. CSW signing off. Please re consult if further needs arise.   Employment status:  Retired Nurse, adult PT Recommendations:  Not assessed at this time Information / Referral to community resources:     Patient/Family's Response to care:  Patient thanked CSW for assistance   Patient/Family's Understanding of and Emotional Response to Diagnosis, Current Treatment, and Prognosis:  Patient states he understands his current diagnoses and treatment   Emotional Assessment Appearance:  Appears stated age Attitude/Demeanor/Rapport:  Guarded Affect (typically observed):  Agitated Orientation:  Oriented to Self, Oriented to Place, Oriented to  Time, Oriented to Situation Alcohol / Substance use:  Not Applicable Psych involvement (Current and /or in the community):  No (Comment)  Discharge Needs  Concerns to be addressed:  Discharge Planning Concerns Readmission within the last 30 days:  No Current discharge risk:  Homeless Barriers to Discharge:  Continued Medical Work up   [employer], [place] 07/26/2018, 12:09 PM

## 2018-07-26 NOTE — Plan of Care (Signed)
  Problem: Education: Goal: Knowledge of General Education information will improve Description Including pain rating scale, medication(s)/side effects and non-pharmacologic comfort measures Outcome: Progressing   Problem: Activity: Goal: Risk for activity intolerance will decrease Outcome: Progressing   Problem: Nutrition: Goal: Adequate nutrition will be maintained Outcome: Progressing   Problem: Coping: Goal: Level of anxiety will decrease Outcome: Progressing   Problem: Pain Managment: Goal: General experience of comfort will improve Outcome: Progressing   Problem: Safety: Goal: Ability to remain free from injury will improve Outcome: Progressing   Problem: Education: Goal: Knowledge of disease or condition will improve Outcome: Progressing Goal: Knowledge of the prescribed therapeutic regimen will improve Outcome: Progressing Goal: Individualized Educational Video(s) Outcome: Progressing   Problem: Respiratory: Goal: Ability to maintain a clear airway will improve Outcome: Progressing

## 2018-07-26 NOTE — Progress Notes (Signed)
Inpatient Diabetes Program Recommendations  AACE/ADA: New Consensus Statement on Inpatient Glycemic Control (2015)  Target Ranges:  Prepandial:   less than 140 mg/dL      Peak postprandial:   less than 180 mg/dL (1-2 hours)      Critically ill patients:  140 - 180 mg/dL   Lab Results  Component Value Date   GLUCAP 262 (H) 07/26/2018   HGBA1C 5.8 (H) 12/16/2016    Review of Glycemic Control Results for [name] [name], [name] [name] (MRN [id number]) as of 07/26/2018 13:15  Ref. Range 06/20/2018 08:00 06/20/2018 12:02 07/26/2018 02:01 07/26/2018 07:26 07/26/2018 11:32  Glucose-Capillary Latest Ref Range: 70 - 99 mg/dL 81 187 (H) 171 (H) 166 (H) 262 (H)   Diabetes history: DM 2  Outpatient Diabetes medications: Metformin 500 mg bid Current orders for Inpatient glycemic control: Novolog sensitive tid with meals and HS, Solumedrol 80 mg IV q 12 hours Inpatient Diabetes Program Recommendations:   If appropriate, while on IV steroids, consider increasing Novolog correction to moderate tid with meals.  Also please check A1C (last A1C was in 3/18).  Thanks,  Adah Perl, RN, BC-ADM Inpatient Diabetes Coordinator Pager [phone] (8a-5p)

## 2018-07-26 NOTE — H&P (Signed)
[facility] [place] NAME: [name] [name]    MR#:  [id number]  DATE OF BIRTH:  [birth year]  DATE OF ADMISSION:  07/25/2018  PRIMARY CARE PHYSICIAN: Patient, No Pcp Per   REQUESTING/REFERRING PHYSICIAN:   CHIEF COMPLAINT:   Chief Complaint  Patient presents with  . Shortness of Breath    HISTORY OF PRESENT ILLNESS: [name] [name]  is a 84 y.o. male with a known history of tobacco abuse, COPD, diabetes type 2, hypertension, hyperlipidemia and other comorbidities. Patient presented to emergency room for shortness of breath, reductive cough and wheezing going on for the past 2 to 3 days, gradually getting worse.  His symptoms are worse with exertion.  Patient denies fevers or chills; no chest pain, no palpitations, no nausea, vomiting, diarrhea, bleeding. Blood test done emergency room, including CBC and CMP are grossly unremarkable. Chest x-ray shows severe emphysematous changes and chronic bronchitic changes in lungs. No evidence of active pulmonary disease.  She is admitted for further evaluation and treatment.  PAST MEDICAL HISTORY:   Past Medical History:  Diagnosis Date  . ABSCESS 12/03/2009  . ABSCESS, FINGER 04/07/2010  . ANXIETY 11/03/2009  . ASTHMA 11/03/2009  . CHRONIC OBSTRUCTIVE PULMONARY DISEASE, ACUTE EXACERBATION 11/03/2009  . Colon cancer ([place])   . COPD 11/03/2009  . DEPRESSION 11/03/2009  . DIABETES MELLITUS, TYPE II 11/03/2009  . [place] DISEASE, LUMBAR 11/03/2009  . EMPHYSEMA, BULLOUS 11/03/2009  . GERD 11/03/2009  . HYPERLIPIDEMIA 11/03/2009  . HYPERTENSION 11/03/2009  . Kidney stones 01/30/12   "I've had them 7 times; always have passed them"  . PEPTIC ULCER DISEASE 11/03/2009  . Pneumonia   . RASH-NONVESICULAR 11/03/2009  . RESTLESS LEG SYNDROME 11/03/2009  . Shortness of breath    "sometimes; at any time"  . SPINAL STENOSIS, LUMBAR 11/03/2009    PAST SURGICAL HISTORY:  Past Surgical History:  Procedure Laterality Date  .  Colon cancer surgery    . [place]   left  . INGUINAL HERNIA REPAIR  10/2011   left  . ROTATOR CUFF REPAIR  2003   left  . TONSILLECTOMY  1960    SOCIAL HISTORY:  Social History   Tobacco Use  . Smoking status: Current Every Day Smoker    Packs/day: 1.00    Years: 41.00    Pack years: 41.00    Types: Cigarettes  . Smokeless tobacco: Never Used  . Tobacco comment: "stopped smoking 04/21/1991 then restarted in 2012"  Substance Use Topics  . Alcohol use: No    FAMILY HISTORY:  Family History  Problem Relation Age of Onset  . Heart disease Father   . Heart disease Mother   . Cancer Brother        lung    DRUG ALLERGIES: No Known Allergies  REVIEW OF SYSTEMS:   CONSTITUTIONAL: No fever, but positive for fatigue generalized weakness.  EYES: No changes in vision.  EARS, NOSE, AND THROAT: No tinnitus or ear pain.  RESPIRATORY: [place] for productive cough, shortness of breath, wheezing; no hemoptysis.  CARDIOVASCULAR: No chest pain, orthopnea, edema.  GASTROINTESTINAL: No nausea, vomiting, diarrhea or abdominal pain.  GENITOURINARY: No dysuria, hematuria.  ENDOCRINE: No polyuria, nocturia. HEMATOLOGY: No bleeding. SKIN: No rash or lesion. MUSCULOSKELETAL: No joint pain at this time.   NEUROLOGIC: No focal weakness.  PSYCHIATRY: No anxiety or depression.   MEDICATIONS AT HOME:  Prior to Admission medications   Medication Sig Start Date End Date  Taking? Authorizing Provider  albuterol (PROVENTIL HFA;VENTOLIN HFA) 108 (90 Base) MCG/ACT inhaler Inhale 2 puffs into the lungs every 6 (six) hours as needed. 05/24/18   Salary, Holly Bodily D, MD  aspirin 81 MG chewable tablet Chew 1 tablet daily by mouth. 12/05/12   [provider]  atorvastatin (LIPITOR) 40 MG tablet Take 1 tablet (40 mg total) by mouth daily. 12/16/16   Bettey Costa, MD  Fluticasone-Salmeterol (ADVAIR) 250-50 MCG/DOSE AEPB Inhale 1 puff into the lungs 2 (two) times daily. 06/07/18   Quintella Reichert, MD  furosemide (LASIX) 20 MG tablet Take 1 tablet (20 mg total) by mouth daily as needed for fluid or edema. 06/20/18 07/20/18  Gladstone Lighter, MD  ipratropium-albuterol (DUONEB) 0.5-2.5 (3) MG/3ML SOLN Take 3 mLs by nebulization every 6 (six) hours as needed (as needed for shortness of breath). 03/26/17   Arrien, Jimmy Picket, MD  metFORMIN (GLUCOPHAGE) 500 MG tablet Take 500 mg by mouth 2 (two) times daily with a meal.    [provider]  metoprolol tartrate (LOPRESSOR) 50 MG tablet Take 1 tablet (50 mg total) by mouth 2 (two) times daily. 06/20/18   Gladstone Lighter, MD  midodrine (PROAMATINE) 5 MG tablet Take 5 mg by mouth 3 (three) times daily with meals.  05/11/18   [provider]  tamsulosin (FLOMAX) 0.4 MG CAPS capsule Take 0.4 mg daily by mouth.    [provider]      PHYSICAL EXAMINATION:   VITAL SIGNS: Blood pressure 123/69, pulse 81, temperature 98.1 F (36.7 C), temperature source Oral, resp. rate 16, height 5\' 10"  (1.778 m), weight 77.1 kg, SpO2 100 %.  GENERAL:  84 y.o.-year-old patient lying in the bed with mild respiratory distress, at rest.  EYES: Pupils equal, round, reactive to light and accommodation. No scleral icterus. Extraocular muscles intact.  HEENT: Head atraumatic, normocephalic. Oropharynx and nasopharynx clear.  NECK:  Supple, no jugular venous distention. No thyroid enlargement, no tenderness.  LUNGS: Reduced breath sounds and wheezing noted bilaterally.  Mild use of accessory muscles of respiration is noted.  CARDIOVASCULAR: S1, S2 normal. No S3/S4.  ABDOMEN: Soft, nontender, nondistended. Bowel sounds present. No organomegaly or mass.  EXTREMITIES: No pedal edema, cyanosis, or clubbing.  NEUROLOGIC: No focal weakness. PSYCHIATRIC: The patient is alert and oriented x 3.  SKIN: No obvious rash, lesion, or ulcer.   LABORATORY PANEL:   CBC Recent Labs  Lab 07/25/18 2131  WBC 10.4  HGB 14.2  HCT 46.6  PLT 219   MCV 95.7  MCH 29.2  MCHC 30.5  RDW 16.1*   ------------------------------------------------------------------------------------------------------------------  Chemistries  Recent Labs  Lab 07/25/18 2131  NA 144  K 4.0  CL 105  CO2 27  GLUCOSE 127*  BUN 17  CREATININE 0.84  CALCIUM 9.5   ------------------------------------------------------------------------------------------------------------------ estimated creatinine clearance is 70 mL/min (by C-G formula based on SCr of 0.84 mg/dL). ------------------------------------------------------------------------------------------------------------------ No results for input(s): TSH, T4TOTAL, T3FREE, THYROIDAB in the last 72 hours.  Invalid input(s): FREET3   Coagulation profile No results for input(s): INR, PROTIME in the last 168 hours. ------------------------------------------------------------------------------------------------------------------- No results for input(s): DDIMER in the last 72 hours. -------------------------------------------------------------------------------------------------------------------  Cardiac Enzymes Recent Labs  Lab 07/25/18 2131  TROPONINI <0.03   ------------------------------------------------------------------------------------------------------------------ Invalid input(s): POCBNP  ---------------------------------------------------------------------------------------------------------------  Urinalysis    Component Value Date/Time   COLORURINE YELLOW 06/06/2018 [place] 06/06/2018 2352   APPEARANCEUR Hazy 05/15/2012 1605   LABSPEC 1.015 06/06/2018 2352   LABSPEC 1.020 05/15/2012 1605  PHURINE 6.0 06/06/2018 2352   GLUCOSEU NEGATIVE 06/06/2018 2352   GLUCOSEU Negative 05/15/2012 1605   GLUCOSEU >=1000 11/03/2009 1019   HGBUR NEGATIVE 06/06/2018 2352   BILIRUBINUR NEGATIVE 06/06/2018 2352   BILIRUBINUR Negative 05/15/2012 1605   KETONESUR NEGATIVE  06/06/2018 2352   PROTEINUR NEGATIVE 06/06/2018 2352   UROBILINOGEN 0.2 03/17/2012 0216   NITRITE NEGATIVE 06/06/2018 2352   LEUKOCYTESUR SMALL (A) 06/06/2018 2352   LEUKOCYTESUR Negative 05/15/2012 1605     RADIOLOGY: Dg Chest 2 View  Result Date: 07/25/2018 CLINICAL DATA:  Increasing shortness of breath and productive cough over the last 2 days. EXAM: CHEST - 2 VIEW COMPARISON:  06/26/2018 FINDINGS: Mild cardiac enlargement. No vascular congestion. Prominent emphysematous changes in the lungs with scattered fibrosis. No airspace disease or consolidation. No blunting of costophrenic angles. No pneumothorax. Mediastinal contours appear intact. Degenerative changes in the spine. IMPRESSION: Severe emphysematous changes and chronic bronchitic changes in lungs. No evidence of active pulmonary disease. Electronically Signed   By: Lucienne Capers M.D.   On: 07/25/2018 21:07    EKG: Orders placed or performed during the hospital encounter of 07/25/18  . EKG 12-Lead  . EKG 12-Lead  . ED EKG within 10 minutes  . ED EKG within 10 minutes    IMPRESSION AND PLAN:  1.  Acute COPD exacerbation.  We will start treatment with IV antibiotics, IV steroids, nebulizer treatments and oxygen as needed. 2.  Tobacco abuse.  Smoking cessation was discussed with patient in detail. 3.  Diabetes type 2.  Will monitor blood sugars before meals and at bedtime and use insulin treatment during the hospital stay. 4.  Hypertension, stable, resume home medications. 5.  Hyperlipidemia, on statin.  All the records are reviewed and case discussed with ED provider. Management plans discussed with the patient, family and they are in agreement.  CODE STATUS: DNR Code Status History    Date Active Date Inactive Code Status Order ID Comments User Context   06/17/2018 2235 06/20/2018 2038 DNR [id number]  Sela Hua, MD Inpatient   05/20/2018 1232 05/24/2018 1803 DNR [id number]  Loletha Grayer, MD ED   03/24/2017 0552  03/26/2017 1600 DNR [id number]  Norval Morton, MD ED   02/21/2017 0908 02/21/2017 1510 DNR [id number]  Dustin Flock, MD Inpatient   01/15/2017 2243 01/17/2017 1512 Full Code [id number]  Saundra Shelling, MD ED   12/15/2016 2054 12/16/2016 2121 Full Code [id number]  Dustin Flock, MD Inpatient   12/15/2016 1840 12/15/2016 2054 DNR [id number]  Dustin Flock, MD ED   28-Nov-202018 2122 11/02/2016 1740 DNR [id number]  Loletha Grayer, MD ED   03/17/2012 0542 03/19/2012 2014 Full Code [id number]  Marylou Mccoy, RN Inpatient   01/30/2012 1656 02/03/2012 1859 Full Code [id number]  Murlean Iba, MD Inpatient   08/11/2011 1803 08/13/2011 1835 Full Code [id number]  Loyal Gambler, RN Inpatient    Questions for Most Recent Historical Code Status (Order [id number])    Question Answer Comment   In the event of cardiac or respiratory ARREST Do not call a "code blue"    In the event of cardiac or respiratory ARREST Do not perform Intubation, CPR, defibrillation or ACLS    In the event of cardiac or respiratory ARREST Use medication by any route, position, wound care, and other measures to relive pain and suffering. May use oxygen, suction and manual treatment of airway obstruction as needed for comfort.    Comments nurse may pronounce  TOTAL TIME TAKING CARE OF THIS PATIENT: 50 minutes.    Amelia Jo M.D on 07/26/2018 at 12:17 AM  Between 7am to 6pm - Pager - [phone]  After 6pm go to [email or web address] - password [email or web address] [facility] [place] [facility]  [phone]  CC: Primary care physician; Patient, No Pcp Per

## 2018-07-27 LAB — GLUCOSE, CAPILLARY
GLUCOSE-CAPILLARY: 165 mg/dL — AB (ref 70–99)
GLUCOSE-CAPILLARY: 201 mg/dL — AB (ref 70–99)
GLUCOSE-CAPILLARY: 181 mg/dL — AB (ref 70–99)
GLUCOSE-CAPILLARY: 238 mg/dL — AB (ref 70–99)

## 2018-07-27 MED ORDER — AZITHROMYCIN 250 MG PO TABS
250.0000 mg | ORAL_TABLET | Freq: Once | ORAL | Status: AC
  Administered 2018-07-27: 18:00:00 250 mg via ORAL
  Filled 2018-07-27: qty 1

## 2018-07-27 MED ORDER — FUROSEMIDE 20 MG PO TABS
20.0000 mg | ORAL_TABLET | Freq: Every day | ORAL | Status: DC
  Administered 2018-07-28 – 2018-07-29 (×2): 20 mg via ORAL
  Filled 2018-07-27 (×2): qty 1

## 2018-07-27 MED ORDER — BUDESONIDE 0.25 MG/2ML IN SUSP
0.2500 mg | Freq: Two times a day (BID) | RESPIRATORY_TRACT | Status: DC
  Administered 2018-07-27 – 2018-07-29 (×4): 0.25 mg via RESPIRATORY_TRACT
  Filled 2018-07-27 (×4): qty 2

## 2018-07-27 MED ORDER — GUAIFENESIN-DM 100-10 MG/5ML PO SYRP
5.0000 mL | ORAL_SOLUTION | ORAL | Status: DC | PRN
  Administered 2018-07-27 – 2018-07-28 (×3): 5 mL via ORAL
  Filled 2018-07-27 (×4): qty 5

## 2018-07-27 NOTE — Progress Notes (Signed)
[facility] at [place] NAME: [name] [name]    MR#:  [id number]  DATE OF BIRTH:  December 18, 1933  SUBJECTIVE:  CHIEF COMPLAINT:   Chief Complaint  Patient presents with  . Shortness of Breath  Came with SOB. COPD exacerbation. He was able to walk half a mile before a few days.  REVIEW OF SYSTEMS:  CONSTITUTIONAL: No fever, fatigue or weakness.  EYES: No blurred or double vision.  EARS, NOSE, AND THROAT: No tinnitus or ear pain.  RESPIRATORY: have cough, shortness of breath, wheezing ,no hemoptysis.  CARDIOVASCULAR: No chest pain, orthopnea, edema.  GASTROINTESTINAL: No nausea, vomiting, diarrhea or abdominal pain.  GENITOURINARY: No dysuria, hematuria.  ENDOCRINE: No polyuria, nocturia,  HEMATOLOGY: No anemia, easy bruising or bleeding SKIN: No rash or lesion. MUSCULOSKELETAL: No joint pain or arthritis.   NEUROLOGIC: No tingling, numbness, weakness.  PSYCHIATRY: No anxiety or depression.   ROS  DRUG ALLERGIES:  No Known Allergies  VITALS:  Blood pressure 113/65, pulse 72, temperature 97.9 F (36.6 C), temperature source Oral, resp. rate 17, height 5\' 10"  (1.778 m), weight 77.1 kg, SpO2 95 %.  PHYSICAL EXAMINATION:  GENERAL:  84 y.o.-year-old patient lying in the bed with no acute distress.  EYES: Pupils equal, round, reactive to light and accommodation. No scleral icterus. Extraocular muscles intact.  HEENT: Head atraumatic, normocephalic. Oropharynx and nasopharynx clear.  NECK:  Supple, no jugular venous distention. No thyroid enlargement, no tenderness.  LUNGS: Normal breath sounds bilaterally, severe wheezing, rales,rhonchi or crepitation. No use of accessory muscles of respiration.  CARDIOVASCULAR: S1, S2 normal. No murmurs, rubs, or gallops.  ABDOMEN: Soft, nontender, nondistended. Bowel sounds present. No organomegaly or mass.  EXTREMITIES: No pedal edema, cyanosis, or clubbing.  NEUROLOGIC: Cranial nerves II through XII are intact.  Muscle strength 4/5 in all extremities. Sensation intact. Gait not checked.  PSYCHIATRIC: The patient is alert and oriented x 3.  SKIN: No obvious rash, lesion, or ulcer.   Physical Exam LABORATORY PANEL:   CBC Recent Labs  Lab 07/26/18 0429  WBC 9.4  HGB 12.5*  HCT 39.2  PLT 212   ------------------------------------------------------------------------------------------------------------------  Chemistries  Recent Labs  Lab 07/26/18 0429  NA 143  K 4.1  CL 105  CO2 29  GLUCOSE 191*  BUN 21  CREATININE 0.87  CALCIUM 8.8*   ------------------------------------------------------------------------------------------------------------------  Cardiac Enzymes Recent Labs  Lab 07/25/18 2131  TROPONINI <0.03   ------------------------------------------------------------------------------------------------------------------  RADIOLOGY:  Dg Chest 2 View  Result Date: 07/25/2018 CLINICAL DATA:  Increasing shortness of breath and productive cough over the last 2 days. EXAM: CHEST - 2 VIEW COMPARISON:  06/26/2018 FINDINGS: Mild cardiac enlargement. No vascular congestion. Prominent emphysematous changes in the lungs with scattered fibrosis. No airspace disease or consolidation. No blunting of costophrenic angles. No pneumothorax. Mediastinal contours appear intact. Degenerative changes in the spine. IMPRESSION: Severe emphysematous changes and chronic bronchitic changes in lungs. No evidence of active pulmonary disease. Electronically Signed   By: Lucienne Capers M.D.   On: 07/25/2018 21:07    ASSESSMENT AND PLAN:   Active Problems:   COPD with acute exacerbation ([place])  1.  Acute COPD exacerbation.    IV antibiotics, IV steroids, nebulizer treatments and oxygen as needed. 2.  Tobacco abuse.  Smoking cessation was discussed with patient in detail. Time spent 4 min. 3.  Diabetes type 2.  Will monitor blood sugars before meals and at bedtime and use insulin treatment during the  hospital stay. 4.  Hypertension, stable, resume home medications. 5.  Hyperlipidemia, on statin.   All the records are reviewed and case discussed with Care Management/Social Workerr. Management plans discussed with the patient, family and they are in agreement.  CODE STATUS: DNR  TOTAL TIME TAKING CARE OF THIS PATIENT: 35 minutes.     POSSIBLE D/C IN 1-2 DAYS, DEPENDING ON CLINICAL CONDITION.   Vaughan Basta M.D on 07/27/2018   Between 7am to 6pm - Pager - [phone]  After 6pm go to [email or web address] - password [email or web address] [place] [facility]  Office  [phone]  CC: Primary care physician; Patient, No Pcp Per  Note: This dictation was prepared with Dragon dictation along with smaller phrase technology. Any transcriptional errors that result from this process are unintentional.

## 2018-07-27 NOTE — Progress Notes (Signed)
[name] [name]: [name] [name]    MR#:  [id number]  DATE OF BIRTH:  May 28, 1934  SUBJECTIVE:  CHIEF COMPLAINT:   Chief Complaint  Patient presents with  . Shortness of Breath  Came with SOB. COPD exacerbation. He was able to walk half a mile before a few days. Was hypoxic and SOB on walking to bathroom. REVIEW OF SYSTEMS:  CONSTITUTIONAL: [name] fever, fatigue or weakness.  EYES: [name] blurred or double vision.  EARS, NOSE, AND THROAT: [name] tinnitus or ear pain.  RESPIRATORY: have cough, shortness of breath, wheezing ,[name] hemoptysis.  CARDIOVASCULAR: [name] chest pain, orthopnea, edema.  GASTROINTESTINAL: [name] nausea, vomiting, diarrhea or abdominal pain.  GENITOURINARY: [name] dysuria, hematuria.  ENDOCRINE: [name] polyuria, nocturia,  HEMATOLOGY: [name] anemia, easy bruising or bleeding SKIN: [name] rash or lesion. MUSCULOSKELETAL: [name] joint pain or arthritis.   NEUROLOGIC: [name] tingling, numbness, weakness.  PSYCHIATRY: [name] anxiety or depression.   ROS  DRUG ALLERGIES:  [name] Known Allergies  VITALS:  Blood pressure 113/65, pulse 72, temperature 97.9 F (36.6 C), temperature source Oral, resp. rate 17, height 5\' 10"  (1.778 m), weight 77.1 kg, SpO2 95 %.  PHYSICAL EXAMINATION:  GENERAL:  84 y.o.-year-old patient lying in the bed with [name] acute distress.  EYES: Pupils equal, round, reactive to light and accommodation. [name] scleral icterus. Extraocular muscles intact.  HEENT: Head atraumatic, normocephalic. Oropharynx and nasopharynx clear.  NECK:  Supple, [name] jugular venous distention. [name] thyroid enlargement, [name] tenderness.  LUNGS: Normal breath sounds bilaterally, severe wheezing, rales,rhonchi or crepitation. [name] use of accessory muscles of respiration.  CARDIOVASCULAR: S1, S2 normal. [name] murmurs, rubs, or gallops.  ABDOMEN: Soft, nontender, nondistended. Bowel sounds present. [name] organomegaly or mass.  EXTREMITIES: [name] pedal edema, cyanosis, or clubbing.   NEUROLOGIC: Cranial nerves II through XII are intact. Muscle strength 4/5 in all extremities. Sensation intact. Gait not checked.  PSYCHIATRIC: The patient is alert and oriented x 3.  SKIN: [name] obvious rash, lesion, or ulcer.   Physical Exam LABORATORY PANEL:   CBC Recent Labs  Lab 07/26/18 0429  WBC 9.4  HGB 12.5*  HCT 39.2  PLT 212   ------------------------------------------------------------------------------------------------------------------  Chemistries  Recent Labs  Lab 07/26/18 0429  NA 143  K 4.1  CL 105  CO2 29  GLUCOSE 191*  BUN 21  CREATININE 0.87  CALCIUM 8.8*   ------------------------------------------------------------------------------------------------------------------  Cardiac Enzymes Recent Labs  Lab 07/25/18 2131  TROPONINI <0.03   ------------------------------------------------------------------------------------------------------------------  RADIOLOGY:  Dg Chest 2 View  Result Date: 07/25/2018 CLINICAL DATA:  Increasing shortness of breath and productive cough over the last 2 days. EXAM: CHEST - 2 VIEW COMPARISON:  06/26/2018 FINDINGS: Mild cardiac enlargement. [name] vascular congestion. Prominent emphysematous changes in the lungs with scattered fibrosis. [name] airspace disease or consolidation. [name] blunting of costophrenic angles. [name] pneumothorax. Mediastinal contours appear intact. Degenerative changes in the spine. IMPRESSION: Severe emphysematous changes and chronic bronchitic changes in lungs. [name] evidence of active pulmonary disease. Electronically Signed   By: [name] [name] M.D.   On: 07/25/2018 21:07    ASSESSMENT AND PLAN:   Active Problems:   COPD with acute exacerbation ([place])  1.  Acute COPD exacerbation.    IV antibiotics, IV steroids, nebulizer treatments and oxygen as needed.  Added incentive spirometer and Budesonide nebs. 2.  Tobacco abuse.  Smoking cessation was discussed with patient in detail. Time spent 4 min. 3.   Diabetes type 2.  Will monitor blood sugars  before meals and at bedtime and use insulin treatment during the hospital stay. 4.  Hypertension, stable, resume home medications. 5.  Hyperlipidemia, on statin.   All the records are reviewed and case discussed with Care Management/Social Workerr. Management plans discussed with the patient, family and they are in agreement.  CODE STATUS: DNR  TOTAL TIME TAKING CARE OF THIS PATIENT: 35 minutes.    POSSIBLE D/C IN 1-2 DAYS, DEPENDING ON CLINICAL CONDITION.   [name] [name] M.D on 07/27/2018   Between 7am to 6pm - Pager - [phone]  After 6pm go to [email or web address] - password [email or web address] [place] [facility]  Office  [phone]  CC: Primary care physician; Patient, [name] [name]  Note: This dictation was prepared with Dragon dictation along with smaller phrase technology. Any transcriptional errors that result from this process are unintentional.

## 2018-07-28 DIAGNOSIS — Z008 Encounter for other general examination: Secondary | ICD-10-CM

## 2018-07-28 DIAGNOSIS — J441 Chronic obstructive pulmonary disease with (acute) exacerbation: Principal | ICD-10-CM

## 2018-07-28 LAB — GLUCOSE, CAPILLARY
GLUCOSE-CAPILLARY: 203 mg/dL — AB (ref 70–99)
GLUCOSE-CAPILLARY: 180 mg/dL — AB (ref 70–99)
Glucose-Capillary: 210 mg/dL — ABNORMAL HIGH (ref 70–99)
Glucose-Capillary: 203 mg/dL — ABNORMAL HIGH (ref 70–99)

## 2018-07-28 MED ORDER — GUAIFENESIN ER 600 MG PO TB12
600.0000 mg | ORAL_TABLET | Freq: Two times a day (BID) | ORAL | Status: DC | PRN
  Administered 2018-07-28: 23:00:00 1200 mg via ORAL
  Filled 2018-07-28: qty 2

## 2018-07-28 NOTE — Progress Notes (Signed)
After ambulating in the hall with one assist his oxygen was 91 percent. Collier Bullock RN

## 2018-07-28 NOTE — Consult Note (Signed)
[facility] Psychiatry Consult   Reason for Consult:  Depression Referring Physician:  Dr.Salary Patient Identification: [name] [name] MRN:  [id number] Principal Diagnosis: <principal problem not specified> Diagnosis:   Patient Active Problem List   Diagnosis Date Noted  . COPD with acute exacerbation ([place]) [J44.1] 07/26/2018  . Congestive heart failure ([place]) [I50.9]   . Palliative care by specialist [Z51.5]   . DNR (do not resuscitate) [Z66]   . COPD exacerbation ([place]) [J44.1] 03/24/2017  . Hypoxia [R09.02] 03/24/2017  . Diabetes mellitus type 2 in nonobese ([place]) [E11.9] 03/24/2017  . Polysubstance abuse ([place]) [F19.10] 03/24/2017  . Pressure injury of skin [L89.90] 03/24/2017  . Overdose of benzodiazepine [T42.4X1A] 01/16/2017  . Unresponsiveness [R41.89]   . HCAP (healthcare-associated pneumonia) [J18.9]   . Respiratory failure ([place]) [J96.90] 01/15/2017  . CVA (cerebral vascular accident) ([place]) [I63.9] 12/15/2016  . Chest pain [R07.9] 06-05-2017  . PNA (pneumonia) [J18.9] 03/17/2012  . Weakness generalized [R53.1] 03/17/2012  . Generalized weakness [R53.1] 01/30/2012  . Fall at home [W19.Merril Abbe, E26.834] 01/30/2012  . Physical deconditioning [R53.81] 01/30/2012  . Nausea vomiting and diarrhea [R11.2, R19.7] 01/15/2012  . UTI (urinary tract infection) [N39.0] 01/15/2012  . Cocaine abuse ([place]) [F14.10] 08/11/2011  . Tobacco abuse [Z72.0] 08/11/2011  . Orthostasis [I95.1] 12/23/2010  . Dehydration [E86.0] 12/23/2010  . Abdominal pain, other specified site [R10.9] 12/23/2010  . Dizziness [R42] 12/23/2010  . Weight loss [R63.4] 12/23/2010  . Left lumbar radiculopathy [M54.16] 12/23/2010  . DIABETES MELLITUS, TYPE II [E11.9] 11/03/2009  . HYPERLIPIDEMIA [E78.5] 11/03/2009  . ANXIETY [F41.1] 11/03/2009  . DEPRESSION [F32.9] 11/03/2009  . RESTLESS LEG SYNDROME [G25.81] 11/03/2009  . HYPERTENSION [I10] 11/03/2009  . 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 ASTHMA [J45.909]  11/03/2009  . COPD [J44.9] 11/03/2009  . GERD [K21.9] 11/03/2009  . PEPTIC ULCER DISEASE [K27.9] 11/03/2009  . DISC DISEASE, LUMBAR [M51.37] 11/03/2009  . SPINAL STENOSIS, LUMBAR [M48.061] 11/03/2009  . NEPHROLITHIASIS, HX OF [Z87.442] 11/03/2009    Total Time spent with patient: 30 minutes  Subjective:   [name] [name] is a 84 y.o. male patient admitted with shortness of breath.  HPI: Patient is an 84 year old white male who was admitted with shortness of breath due to COPD exacerbation.  Chart reviewed and patient was seen in his room for a consult for depression.  Patient is pleasant and cooperative.  He stated that he was not depressed and that he was not having any intention to hurt himself.  States that he did have some depression in the past but it had gotten better much recently.  Reports that he is currently living in his car.  States that he cannot afford to have a home.  He does have adult children but reports that their homes are full with their own families. He currently denies any depressive symptoms.  He was very matter-of-fact talking about him living in the car.  He did not elaborate on the circumstances leading to this.  States that he bought a new car and is paying for that as well as the insurance took up quite a bit of his income. Patient was alert and oriented to himself, place and situation.  His speech was normal in rate and volume.  He appeared to have fair insight into his condition.  He was very matter-of-fact about living in his car due to financial issues.  He denied any suicidal or homicidal thoughts.  He did not have any psychotic symptoms.  Denies drinking alcohol or use of  other substances.  Past Psychiatric History: Reports history of depression in the past.  Risk to Self:  denies Risk to Others:  none Prior Inpatient Therapy:  denies Prior Outpatient Therapy:  denies  Past Medical History:  Past Medical History:  Diagnosis Date  . ABSCESS 12/03/2009  .  ABSCESS, FINGER 04/07/2010  . ANXIETY 11/03/2009  . ASTHMA 11/03/2009  . CHRONIC OBSTRUCTIVE PULMONARY DISEASE, ACUTE EXACERBATION 11/03/2009  . Colon cancer ([place])   . COPD 11/03/2009  . DEPRESSION 11/03/2009  . DIABETES MELLITUS, TYPE II 11/03/2009  . [place] DISEASE, LUMBAR 11/03/2009  . EMPHYSEMA, BULLOUS 11/03/2009  . GERD 11/03/2009  . HYPERLIPIDEMIA 11/03/2009  . HYPERTENSION 11/03/2009  . Kidney stones 01/30/12   "I've had them 7 times; always have passed them"  . PEPTIC ULCER DISEASE 11/03/2009  . Pneumonia   . RASH-NONVESICULAR 11/03/2009  . RESTLESS LEG SYNDROME 11/03/2009  . Shortness of breath    "sometimes; at any time"  . SPINAL STENOSIS, LUMBAR 11/03/2009    Past Surgical History:  Procedure Laterality Date  . Colon cancer surgery    . [place]   left  . INGUINAL HERNIA REPAIR  10/2011   left  . ROTATOR CUFF REPAIR  2003   left  . TONSILLECTOMY  1960   Family History:  Family History  Problem Relation Age of Onset  . Heart disease Father   . Heart disease Mother   . Cancer Brother        lung   Family Psychiatric  History: Denies Social History: Patient lost his wife 10 years ago. Social History   Substance and Sexual Activity  Alcohol Use No     Social History   Substance and Sexual Activity  Drug Use No    Social History   Socioeconomic History  . Marital status: Widowed    Spouse name: Not on file  . Number of children: 2  . Years of education: Not on file  . Highest education level: Not on file  Occupational History    Employer: RETIRED  Social Needs  . Financial resource strain: Patient refused  . Food insecurity:    Worry: Patient refused    Inability: Patient refused  . Transportation needs:    Medical: Patient refused    Non-medical: Patient refused  Tobacco Use  . Smoking status: Current Every Day Smoker    Packs/day: 1.00    Years: 41.00    Pack years: 41.00    Types: Cigarettes  . Smokeless tobacco: Never Used  .  Tobacco comment: "stopped smoking 04/21/1991 then restarted in 2012"  Substance and Sexual Activity  . Alcohol use: No  . Drug use: No  . Sexual activity: Not Currently    Birth control/protection: None  Lifestyle  . Physical activity:    Days per week: Patient refused    Minutes per session: Patient refused  . Stress: Patient refused  Relationships  . Social connections:    Talks on phone: Patient refused    Gets together: Patient refused    Attends religious service: Patient refused    Active member of club or organization: Patient refused    Attends meetings of clubs or organizations: Patient refused    Relationship status: Patient refused  Other Topics Concern  . Not on file  Social History Narrative  . Not on file   Additional Social History:    Allergies:  No Known Allergies  Labs:  Results for orders placed or performed during  the hospital encounter of 07/25/18 (from the past 48 hour(s))  Glucose, capillary     Status: Abnormal   Collection Time: 07/26/18  4:36 PM  Result Value Ref Range   Glucose-Capillary 181 (H) 70 - 99 mg/dL  Glucose, capillary     Status: Abnormal   Collection Time: 07/26/18  8:03 PM  Result Value Ref Range   Glucose-Capillary 186 (H) 70 - 99 mg/dL  Glucose, capillary     Status: Abnormal   Collection Time: 07/27/18  7:34 AM  Result Value Ref Range   Glucose-Capillary 165 (H) 70 - 99 mg/dL  Glucose, capillary     Status: Abnormal   Collection Time: 07/27/18 12:00 PM  Result Value Ref Range   Glucose-Capillary 201 (H) 70 - 99 mg/dL  Glucose, capillary     Status: Abnormal   Collection Time: 07/27/18  5:10 PM  Result Value Ref Range   Glucose-Capillary 181 (H) 70 - 99 mg/dL  Glucose, capillary     Status: Abnormal   Collection Time: 07/27/18  8:46 PM  Result Value Ref Range   Glucose-Capillary 238 (H) 70 - 99 mg/dL  Glucose, capillary     Status: Abnormal   Collection Time: 07/28/18  7:27 AM  Result Value Ref Range   Glucose-Capillary  210 (H) 70 - 99 mg/dL  Glucose, capillary     Status: Abnormal   Collection Time: 07/28/18 11:49 AM  Result Value Ref Range   Glucose-Capillary 203 (H) 70 - 99 mg/dL    Current Facility-Administered Medications  Medication Dose Route Frequency Provider Last Rate Last Dose  . acetaminophen (TYLENOL) tablet 650 mg  650 mg Oral Q6H PRN Amelia Jo, MD       Or  . acetaminophen (TYLENOL) suppository 650 mg  650 mg Rectal Q6H PRN Amelia Jo, MD      . albuterol (PROVENTIL) (2.5 MG/3ML) 0.083% nebulizer solution 2.5 mg  2.5 mg Nebulization Q6H PRN Vaughan Basta, MD      . aspirin chewable tablet 81 mg  81 mg Oral Daily Amelia Jo, MD   81 mg at 07/28/18 7846  . atorvastatin (LIPITOR) tablet 40 mg  40 mg Oral Daily Amelia Jo, MD   40 mg at 07/27/18 1737  . bisacodyl (DULCOLAX) EC tablet 5 mg  5 mg Oral Daily PRN Amelia Jo, MD      . budesonide (PULMICORT) nebulizer solution 0.25 mg  0.25 mg Nebulization BID Vaughan Basta, MD   0.25 mg at 07/28/18 0723  . cefTRIAXone (ROCEPHIN) 1 g in sodium chloride 0.9 % 100 mL IVPB  1 g Intravenous Q24H Amelia Jo, MD 200 mL/hr at 07/27/18 2353 1 g at 07/27/18 2353  . docusate sodium (COLACE) capsule 100 mg  100 mg Oral BID Amelia Jo, MD   100 mg at 07/28/18 9629  . fluticasone furoate-vilanterol (BREO ELLIPTA) 200-25 MCG/INH 1 puff  1 puff Inhalation Daily Amelia Jo, MD   1 puff at 07/28/18 0820  . furosemide (LASIX) tablet 20 mg  20 mg Oral Daily Vaughan Basta, MD   20 mg at 07/28/18 5284  . guaiFENesin-dextromethorphan (ROBITUSSIN DM) 100-10 MG/5ML syrup 5 mL  5 mL Oral Q4H PRN Vaughan Basta, MD   5 mL at 07/28/18 1238  . heparin injection 5,000 Units  5,000 Units Subcutaneous Q8H Amelia Jo, MD   5,000 Units at 07/28/18 0527  . HYDROcodone-acetaminophen (NORCO/VICODIN) 5-325 MG per tablet 1-2 tablet  1-2 tablet Oral Q4H PRN Amelia Jo, MD      .  insulin aspart (novoLOG) injection 0-5 Units   0-5 Units Subcutaneous QHS Amelia Jo, MD   2 Units at 07/27/18 2146  . insulin aspart (novoLOG) injection 0-9 Units  0-9 Units Subcutaneous TID WC Amelia Jo, MD   3 Units at 07/28/18 1230  . ipratropium-albuterol (DUONEB) 0.5-2.5 (3) MG/3ML nebulizer solution 3 mL  3 mL Nebulization Q6H Amelia Jo, MD   3 mL at 07/28/18 1323  . MEDLINE mouth rinse  15 mL Mouth Rinse BID Amelia Jo, MD   15 mL at 07/28/18 0820  . methylPREDNISolone sodium succinate (SOLU-MEDROL) 125 mg/2 mL injection 80 mg  80 mg Intravenous Q12H Amelia Jo, MD   80 mg at 07/28/18 0230  . metoprolol tartrate (LOPRESSOR) tablet 50 mg  50 mg Oral BID Amelia Jo, MD   50 mg at 07/28/18 0823  . midodrine (PROAMATINE) tablet 5 mg  5 mg Oral TID WC Amelia Jo, MD   5 mg at 07/28/18 1232  . ondansetron (ZOFRAN) tablet 4 mg  4 mg Oral Q6H PRN Amelia Jo, MD       Or  . ondansetron [facility]) injection 4 mg  4 mg Intravenous Q6H PRN Amelia Jo, MD      . tamsulosin [facility]) capsule 0.4 mg  0.4 mg Oral Daily Amelia Jo, MD   0.4 mg at 07/27/18 0840  . traZODone (DESYREL) tablet 25 mg  25 mg Oral QHS PRN Amelia Jo, MD   25 mg at 07/27/18 2145    Musculoskeletal: Strength & Muscle Tone: decreased Gait & Station: unsteady Patient leans: Front  Psychiatric Specialty Exam: Physical Exam  ROS  Blood pressure (!) 143/77, pulse 67, temperature (!) 97.5 F (36.4 C), temperature source Oral, resp. rate 20, height 5\' 10"  (1.778 m), weight 78 kg, SpO2 94 %.Body mass index is 24.67 kg/m.  General Appearance: Disheveled  Eye Contact:  Fair  Speech:  Clear and Coherent  Volume:  Normal  Mood:  Euthymic  Affect:  Flat  Thought Process:  Coherent  Orientation:  Full (Time, Place, and Person)  Thought Content:  WDL  Suicidal Thoughts:  No  Homicidal Thoughts:  No  Memory:  Immediate;   Fair Recent;   Fair Remote;   Fair  Judgement:  Fair  Insight:  Fair  Psychomotor Activity:  Decreased  Concentration:   Concentration: Fair and Attention Span: Fair  Recall:  [employer] of Knowledge:  Fair  Language:  Fair  Akathisia:  No  Handed:  Right  AIMS (if indicated):     Assets:  Communication Skills Desire for Improvement  ADL's:  Intact  Cognition:  WNL  Sleep:   fair     Treatment Plan Summary: Patient is an 84 year old male who presented with the shortness of breath secondary to his COPD and with a previous history of some depression. Assessment-  Patient denies symptoms of depression and his main problem is currently living in a car leading to his health  decompensating. At this time he is not a risk to himself.    Patient is in need of assistance with housing.  Recommend that the social worker and case manager work with the patient to obtain him necessary help for housing and food.  Will sign off for now, please consult Psychiatry if needed.     Disposition: No evidence of imminent risk to self or others at present.   Supportive therapy provided about ongoing stressors.  Elvin So, MD 07/28/2018 1:37 PM

## 2018-07-28 NOTE — Progress Notes (Signed)
[place] [facility] [place] NAME: [name] [name]    MR#:  [id number]  DATE OF BIRTH:  [birth year]  SUBJECTIVE:  CHIEF COMPLAINT:   Chief Complaint  Patient presents with  . Shortness of Breath  Patient without complaint, no events overnight per nursing staff  REVIEW OF SYSTEMS:  CONSTITUTIONAL: No fever, fatigue or weakness.  EYES: No blurred or double vision.  EARS, NOSE, AND THROAT: No tinnitus or ear pain.  RESPIRATORY: No cough, shortness of breath, wheezing or hemoptysis.  CARDIOVASCULAR: No chest pain, orthopnea, edema.  GASTROINTESTINAL: No nausea, vomiting, diarrhea or abdominal pain.  GENITOURINARY: No dysuria, hematuria.  ENDOCRINE: No polyuria, nocturia,  HEMATOLOGY: No anemia, easy bruising or bleeding SKIN: No rash or lesion. MUSCULOSKELETAL: No joint pain or arthritis.   NEUROLOGIC: No tingling, numbness, weakness.  PSYCHIATRY: No anxiety or depression.   ROS  DRUG ALLERGIES:  No Known Allergies  VITALS:  Blood pressure (!) 141/72, pulse 70, temperature (!) 97.5 F (36.4 C), temperature source Oral, resp. rate 18, height 5\' 10"  (1.778 m), weight 78 kg, SpO2 98 %.  PHYSICAL EXAMINATION:  GENERAL:  84 y.o.-year-old patient lying in the bed with no acute distress.  EYES: Pupils equal, round, reactive to light and accommodation. No scleral icterus. Extraocular muscles intact.  HEENT: Head atraumatic, normocephalic. Oropharynx and nasopharynx clear.  NECK:  Supple, no jugular venous distention. No thyroid enlargement, no tenderness.  LUNGS: Normal breath sounds bilaterally, no wheezing, rales,rhonchi or crepitation. No use of accessory muscles of respiration.  CARDIOVASCULAR: S1, S2 normal. No murmurs, rubs, or gallops.  ABDOMEN: Soft, nontender, nondistended. Bowel sounds present. No organomegaly or mass.  EXTREMITIES: No pedal edema, cyanosis, or clubbing.  NEUROLOGIC: Cranial nerves II through XII are intact. Muscle strength 5/5  in all extremities. Sensation intact. Gait not checked.  PSYCHIATRIC: The patient is alert and oriented x 3.  SKIN: No obvious rash, lesion, or ulcer.   Physical Exam LABORATORY PANEL:   CBC Recent Labs  Lab 07/26/18 0429  WBC 9.4  HGB 12.5*  HCT 39.2  PLT 212   ------------------------------------------------------------------------------------------------------------------  Chemistries  Recent Labs  Lab 07/26/18 0429  NA 143  K 4.1  CL 105  CO2 29  GLUCOSE 191*  BUN 21  CREATININE 0.87  CALCIUM 8.8*   ------------------------------------------------------------------------------------------------------------------  Cardiac Enzymes Recent Labs  Lab 07/25/18 2131  TROPONINI <0.03   ------------------------------------------------------------------------------------------------------------------  RADIOLOGY:  No results found.  ASSESSMENT AND PLAN:  *Acute COPD exacerbation Resolved Continue steroid taper, breathing treatments as needed, supplemental oxygen  *Acute on chronic respiratory failure Stable on chronic O2 requirement of 2 L via nasal cannula continuous  *Acute homelessness Case management/social services to help with disposition planning  *Chronic tobacco smoking abuse/dependency Nicotine patch and cessation counseling ordered  *Diabetes mellitus type 2 Table on current regiment  *Hypertension On current regiment  *Hyperlipidemia Stable on current regiment  All the records are reviewed and case discussed with Care Management/Social Workerr. Management plans discussed with the patient, family and they are in agreement.  CODE STATUS: DNR  TOTAL TIME TAKING CARE OF THIS PATIENT: 40 minutes.     POSSIBLE D/C IN 1 DAYS, DEPENDING ON CLINICAL CONDITION.   Avel Peace Haiden Rawlinson M.D on 07/28/2018   Between 7am to 6pm - Pager - [phone]  After 6pm go to [email or web address] - password [email or web address] [facility]  Office   [phone]  CC: Primary care physician; Patient, No Pcp Per  Note: This  dictation was prepared with Dragon dictation along with smaller phrase technology. Any transcriptional errors that result from this process are unintentional.

## 2018-07-29 LAB — GLUCOSE, CAPILLARY
GLUCOSE-CAPILLARY: 208 mg/dL — AB (ref 70–99)
Glucose-Capillary: 271 mg/dL — ABNORMAL HIGH (ref 70–99)

## 2018-07-29 MED ORDER — FLUTICASONE-SALMETEROL 250-50 MCG/DOSE IN AEPB: 1.0000 | INHALATION_SPRAY | Freq: Two times a day (BID) | RESPIRATORY_TRACT | 3 refills | Status: AC

## 2018-07-29 MED ORDER — TAMSULOSIN HCL 0.4 MG PO CAPS: 0.4000 mg | ORAL_CAPSULE | Freq: Every day | ORAL | 0 refills | Status: AC

## 2018-07-29 MED ORDER — CEFDINIR 300 MG PO CAPS: 300.0000 mg | ORAL_CAPSULE | Freq: Two times a day (BID) | ORAL | 0 refills | Status: DC

## 2018-07-29 MED ORDER — FUROSEMIDE 20 MG PO TABS: 20.0000 mg | ORAL_TABLET | Freq: Every day | ORAL | 0 refills | Status: AC | PRN

## 2018-07-29 MED ORDER — PREDNISONE 50 MG PO TABS: ORAL_TABLET | ORAL | 0 refills | Status: DC

## 2018-07-29 MED ORDER — ALBUTEROL SULFATE (2.5 MG/3ML) 0.083% IN NEBU: 3.0000 mL | INHALATION_SOLUTION | RESPIRATORY_TRACT | Status: DC | PRN

## 2018-07-29 MED ORDER — ATORVASTATIN CALCIUM 40 MG PO TABS: 40.0000 mg | ORAL_TABLET | Freq: Every day | ORAL | 0 refills | Status: AC

## 2018-07-29 MED ORDER — METOPROLOL TARTRATE 50 MG PO TABS: 50.0000 mg | ORAL_TABLET | Freq: Two times a day (BID) | ORAL | 0 refills | Status: AC

## 2018-07-29 MED ORDER — METFORMIN HCL 500 MG PO TABS: 500.0000 mg | ORAL_TABLET | Freq: Two times a day (BID) | ORAL | 0 refills | Status: DC

## 2018-07-29 MED ORDER — MIDODRINE HCL 5 MG PO TABS: 5.0000 mg | ORAL_TABLET | Freq: Three times a day (TID) | ORAL | 0 refills | Status: DC

## 2018-07-29 MED ORDER — ALBUTEROL SULFATE HFA 108 (90 BASE) MCG/ACT IN AERS: 2.0000 | INHALATION_SPRAY | Freq: Four times a day (QID) | RESPIRATORY_TRACT | 6 refills | Status: AC | PRN

## 2018-07-29 MED ORDER — GUAIFENESIN ER 600 MG PO TB12: 600.0000 mg | ORAL_TABLET | Freq: Two times a day (BID) | ORAL | 1 refills | Status: DC | PRN

## 2018-07-29 MED ORDER — ASPIRIN 81 MG PO CHEW: 81.0000 mg | CHEWABLE_TABLET | Freq: Every day | ORAL | 0 refills | Status: AC

## 2018-07-29 NOTE — Progress Notes (Signed)
Patient refuses bed alarm or assistance in bathroom. Patient educated on purpose of alarms and assistance.

## 2018-07-29 NOTE — Care Management (Signed)
Patient lives in his car. He plugs his chronic oxygen into the until in his car. Uses [facility]. Patient has no interest in going to any form of a facility but would like to return to his car. He uses [facility] and obtains his medications without issue. There are no further RNCM needs.

## 2018-07-29 NOTE — Evaluation (Signed)
Physical Therapy Evaluation Patient Details Name: [name] [name] MRN: [id number] DOB: 1934/01/22 Today's Date: 07/29/2018   History of Present Illness  [name] [name] is an 84yo male, comes to [place] dyspnea. PMH: COPD, DM2, HTN, HLD. Pt reports he has been homeless since August living in his vehicle.   Clinical Impression  Pt admitted with above diagnosis. Pt currently with functional limitations due to the deficits listed below (see "PT Problem List"). Upon entry, pt in bed, shaving self with electric shaver, no family/caregiver present. The pt is awake and agreeable to participate, endorsing some gait instability x1-2 weeks. The pt is alert and oriented x3, somewhat pleasant at times, voices many grievances unsolicited ranging from violent wishes against certain politically leaning people as well as the poor quality of [employer]. Following simple commands >75% of time, but the patient is intermittently impulsive and distractible, often looking and walking in 2 different dirrections. Pt appears weak (subjectively corroborated) and ataxic with transfers and gait, wide based gait with frequent postural sway posterior, often leaning on wall to avoid falling. Functional mobility assessment demonstrates increased effort/time requirements, limited tolerance, and need for supervision for safety, whereas the patient performed these at a higher level of independence PTA. Pt will benefit from skilled PT intervention to increase independence and safety with basic mobility in preparation for discharge to the venue listed below.       Follow Up Recommendations Outpatient PT;Supervision for mobility/OOB(unclear if this would be a financial burden to the patient or if he will be agreeable)    Equipment Recommendations  Rolling walker with 5" wheels    Recommendations for Other Services       Precautions / Restrictions Precautions Precautions: Fall Restrictions Weight Bearing Restrictions: No       Mobility  Bed Mobility Overal bed mobility: Modified Independent                Transfers Overall transfer level: Needs assistance   Transfers: Sit to/from Stand Sit to Stand: Supervision;Min guard         General transfer comment: ataxic, labored, unsteady  Ambulation/Gait Ambulation/Gait assistance: Min guard Gait Distance (Feet): 180 Feet Assistive device: Rolling walker (2 wheeled) Gait Pattern/deviations: Ataxic;Wide base of support;Staggering right;Staggering left     General Gait Details: seemingly poor awareness (delayed) of loss of trunk control in AMB, with late correction or often leaning posteriorly into wall   Stairs            Wheelchair Mobility    Modified Rankin (Stroke Patients Only)       Balance Overall balance assessment: Needs assistance                                           Pertinent Vitals/Pain Pain Assessment: No/denies pain    Home Living Family/patient expects to be discharged to:: Shelter/Homeless(refusing shelter or resources dfrom CSW )                 Additional Comments: Pt has been staying in motels or his vehicle since coming up from [place] to relocate. He currently has all of his belongings in his car in the [facility] parking lot. No definitive place to stay after discharge    Prior Function Level of Independence: Independent         Comments: Independent with ADLs/IADLs. Drives. Reports several falls from syncope in the past.  Reports chronic lung disease. Reports stumbling recently over the past 2 weeks.      Hand Dominance        Extremity/Trunk Assessment        Lower Extremity Assessment Lower Extremity Assessment: Generalized weakness       Communication   Communication: No difficulties  Cognition Arousal/Alertness: Awake/alert Behavior During Therapy: Impulsive;Agitated(Pt is intermittently very angry, perseverating on political topics ad lib without provocation,  [employer]; poor safetyawareness in session, limited awareness of multiple LOB. ) Overall Cognitive Status: Difficult to assess                                 General Comments: suspect cognitive impairment, but recently cleared by psych       General Comments      Exercises     Assessment/Plan    PT Assessment Patient needs continued PT services  PT Problem List Decreased strength;Decreased activity tolerance;Decreased balance;Decreased mobility       PT Treatment Interventions Balance training;Functional mobility training;Therapeutic activities;Therapeutic exercise;Patient/family education    PT Goals (Current goals can be found in the Care Plan section)  Acute Rehab PT Goals Patient Stated Goal: pt's goal is to leave the hospital as soon as possible.  PT Goal Formulation: With patient Time For Goal Achievement: 08/05/18 Potential to Achieve Goals: Fair    Frequency Min 2X/week   Barriers to discharge        Co-evaluation               AM-PAC PT "6 Clicks" Daily Activity  Outcome Measure Difficulty turning over in bed (including adjusting bedclothes, sheets and blankets)?: A Lot Difficulty moving from lying on back to sitting on the side of the bed? : A Lot Difficulty sitting down on and standing up from a chair with arms (e.g., wheelchair, bedside commode, etc,.)?: A Lot Help needed moving to and from a bed to chair (including a wheelchair)?: A Little Help needed walking in hospital room?: A Little Help needed climbing 3-5 steps with a railing? : A Lot 6 Click Score: 14    End of Session Equipment Utilized During Treatment: Gait belt Activity Tolerance: Patient limited by fatigue;Treatment limited secondary to medical complications (Comment)(DOE ) Patient left: in bed;with call bell/phone within reach Nurse Communication: Mobility status PT Visit Diagnosis: Unsteadiness on feet (R26.81);Difficulty in walking, not elsewhere  classified (R26.2);Ataxic gait (R26.0)    Time: 2025-4270 PT Time Calculation (min) (ACUTE ONLY): 19 min   Charges:   PT Evaluation $PT Eval Moderate Complexity: 1 Mod PT Treatments $Therapeutic Activity: 8-22 mins        11:51 AM, 07/29/18 Etta Grandchild, PT, DPT Physical Therapist - [facility]  [phone] ([place])    Eniya Cannady C 07/29/2018, 11:47 AM

## 2018-07-29 NOTE — Progress Notes (Signed)
Patient discharged per MD order. Prescriptions given to patient. All discharge instructions given and all questions answered.

## 2018-07-29 NOTE — Progress Notes (Signed)
Pt ambulated to and from Bathroom, Oxygen at 94% room air.

## 2018-07-29 NOTE — Clinical Social Work Note (Signed)
Per chart notes, the patient is refusing assistance with housing or resources. The CSW has had a treatment team discussion with [facility] and leadership concerning the patient's decision. The patient has been assessed by psychiatry and was not determined to be incapable of making decisions. CSW will continue to follow should the patient change his mind; however, he is currently refusing shelter options or information about housing and food resources.  Santiago Bumpers, MSW, [facility] [phone]

## 2018-07-29 NOTE — Discharge Summary (Signed)
[facility] NAME: [name] [name]    MR#:  [id number]  DATE OF BIRTH:  18-Nov-1933  DATE OF ADMISSION:  07/25/2018 ADMITTING PHYSICIAN: Amelia Jo, MD  DATE OF DISCHARGE: No discharge date for patient encounter.  PRIMARY CARE PHYSICIAN: Patient, No Pcp Per    ADMISSION DIAGNOSIS:  COPD exacerbation ([place]) [J44.1]  DISCHARGE DIAGNOSIS:  Active Problems:   COPD with acute exacerbation ([place])   SECONDARY DIAGNOSIS:   Past Medical History:  Diagnosis Date  . ABSCESS 12/03/2009  . ABSCESS, FINGER 04/07/2010  . ANXIETY 11/03/2009  . ASTHMA 11/03/2009  . CHRONIC OBSTRUCTIVE PULMONARY DISEASE, ACUTE EXACERBATION 11/03/2009  . Colon cancer ([place])   . COPD 11/03/2009  . DEPRESSION 11/03/2009  . DIABETES MELLITUS, TYPE II 11/03/2009  . [place] DISEASE, LUMBAR 11/03/2009  . EMPHYSEMA, BULLOUS 11/03/2009  . GERD 11/03/2009  . HYPERLIPIDEMIA 11/03/2009  . HYPERTENSION 11/03/2009  . Kidney stones 01/30/12   "I've had them 7 times; always have passed them"  . PEPTIC ULCER DISEASE 11/03/2009  . Pneumonia   . RASH-NONVESICULAR 11/03/2009  . RESTLESS LEG SYNDROME 11/03/2009  . Shortness of breath    "sometimes; at any time"  . SPINAL STENOSIS, LUMBAR 11/03/2009    HOSPITAL COURSE:   *Acute COPD exacerbation Resolved Treated on our COPD protocol, treated with steroid taper, aggressive pulmonary toilet with bronchodilator therapy, and supplemental oxygen  *Acute on chronic respiratory failure Stable on chronic O2 requirement of 2 L via nasal cannula continuous  *Acute homelessness Case management/social services consulted to help with disposition planning   *Chronic tobacco smoking abuse/dependency Nicotine patch and cessation counseling ordered  *Diabetes mellitus type 2 Stable on current regiment  *Hypertension Stable on current regiment  *Hyperlipidemia Stable on current regiment  DISCHARGE CONDITIONS:   stable  CONSULTS  OBTAINED:    DRUG ALLERGIES:  No Known Allergies  DISCHARGE MEDICATIONS:   Allergies as of 07/29/2018   No Known Allergies     Medication List    STOP taking these medications   ipratropium-albuterol 0.5-2.5 (3) MG/3ML Soln Commonly known as:  DUONEB     TAKE these medications   albuterol 108 (90 Base) MCG/ACT inhaler Commonly known as:  PROVENTIL HFA;VENTOLIN HFA Inhale 2 puffs into the lungs every 6 (six) hours as needed.   aspirin 81 MG chewable tablet Chew 1 tablet (81 mg total) by mouth daily.   atorvastatin 40 MG tablet Commonly known as:  LIPITOR Take 1 tablet (40 mg total) by mouth daily.   cefdinir 300 MG capsule Commonly known as:  OMNICEF Take 1 capsule (300 mg total) by mouth 2 (two) times daily.   Fluticasone-Salmeterol 250-50 MCG/DOSE Aepb Commonly known as:  ADVAIR Inhale 1 puff into the lungs 2 (two) times daily.   furosemide 20 MG tablet Commonly known as:  LASIX Take 1 tablet (20 mg total) by mouth daily as needed for fluid or edema.   guaiFENesin 600 MG 12 hr tablet Commonly known as:  MUCINEX Take 1-2 tablets (600-1,200 mg total) by mouth 2 (two) times daily as needed for cough or to loosen phlegm.   metFORMIN 500 MG tablet Commonly known as:  GLUCOPHAGE Take 1 tablet (500 mg total) by mouth 2 (two) times daily with a meal.   metoprolol tartrate 50 MG tablet Commonly known as:  LOPRESSOR Take 1 tablet (50 mg total) by mouth 2 (two) times daily.   midodrine 5 MG tablet Commonly known as:  PROAMATINE Take  1 tablet (5 mg total) by mouth 3 (three) times daily with meals.   tamsulosin 0.4 MG Caps capsule Commonly known as:  FLOMAX Take 1 capsule (0.4 mg total) by mouth daily.        DISCHARGE INSTRUCTIONS:    If you experience worsening of your admission symptoms, develop shortness of breath, life threatening emergency, suicidal or homicidal thoughts you must seek medical attention immediately by calling 911 or calling your MD  immediately  if symptoms less severe.  You Must read complete instructions/literature along with all the possible adverse reactions/side effects for all the Medicines you take and that have been prescribed to you. Take any new Medicines after you have completely understood and accept all the possible adverse reactions/side effects.   Please note  You were cared for by a hospitalist during your hospital stay. If you have any questions about your discharge medications or the care you received while you were in the hospital after you are discharged, you can call the unit and asked to speak with the hospitalist on call if the hospitalist that took care of you is not available. Once you are discharged, your primary care physician will handle any further medical issues. Please note that NO REFILLS for any discharge medications will be authorized once you are discharged, as it is imperative that you return to your primary care physician (or establish a relationship with a primary care physician if you do not have one) for your aftercare needs so that they can reassess your need for medications and monitor your lab values.    Today   CHIEF COMPLAINT:   Chief Complaint  Patient presents with  . Shortness of Breath    HISTORY OF PRESENT ILLNESS:  84 y.o. male with a known history of tobacco abuse, COPD, diabetes type 2, hypertension, hyperlipidemia and other comorbidities. Patient presented to emergency room for shortness of breath, reductive cough and wheezing going on for the past 2 to 3 days, gradually getting worse.  His symptoms are worse with exertion.  Patient denies fevers or chills; no chest pain, no palpitations, no nausea, vomiting, diarrhea, bleeding. Blood test done emergency room, including CBC and CMP are grossly unremarkable. Chest x-ray shows severe emphysematous changes and chronic bronchitic changes in lungs. No evidence of active pulmonary disease.  She is admitted for further  evaluation and treatment.   VITAL SIGNS:  Blood pressure 137/86, pulse 68, temperature 97.6 F (36.4 C), temperature source Oral, resp. rate 18, height 5\' 10"  (1.778 m), weight 76 kg, SpO2 97 %.  I/O:    Intake/Output Summary (Last 24 hours) at 07/29/2018 1004 Last data filed at 07/28/2018 1904 Gross per 24 hour  Intake 1320 ml  Output -  Net 1320 ml    PHYSICAL EXAMINATION:  GENERAL:  84 y.o.-year-old patient lying in the bed with no acute distress.  EYES: Pupils equal, round, reactive to light and accommodation. No scleral icterus. Extraocular muscles intact.  HEENT: Head atraumatic, normocephalic. Oropharynx and nasopharynx clear.  NECK:  Supple, no jugular venous distention. No thyroid enlargement, no tenderness.  LUNGS: Normal breath sounds bilaterally, no wheezing, rales,rhonchi or crepitation. No use of accessory muscles of respiration.  CARDIOVASCULAR: S1, S2 normal. No murmurs, rubs, or gallops.  ABDOMEN: Soft, non-tender, non-distended. Bowel sounds present. No organomegaly or mass.  EXTREMITIES: No pedal edema, cyanosis, or clubbing.  NEUROLOGIC: Cranial nerves II through XII are intact. Muscle strength 5/5 in all extremities. Sensation intact. Gait not checked.  PSYCHIATRIC: The patient  is alert and oriented x 3.  SKIN: No obvious rash, lesion, or ulcer.   DATA REVIEW:   CBC Recent Labs  Lab 07/26/18 0429  WBC 9.4  HGB 12.5*  HCT 39.2  PLT 212    Chemistries  Recent Labs  Lab 07/26/18 0429  NA 143  K 4.1  CL 105  CO2 29  GLUCOSE 191*  BUN 21  CREATININE 0.87  CALCIUM 8.8*    Cardiac Enzymes Recent Labs  Lab 07/25/18 2131  TROPONINI <0.03    Microbiology Results  Results for orders placed or performed during the hospital encounter of 02/21/17  Blood culture (routine x 2)     Status: Abnormal   Collection Time: 02/21/17  8:05 AM  Result Value Ref Range Status   Specimen Description BLOOD L HAND  Final   Special Requests   Final     BOTTLES DRAWN AEROBIC AND ANAEROBIC Blood Culture adequate volume   Culture  Setup Time   Final    GRAM POSITIVE COCCI ANAEROBIC BOTTLE ONLY CRITICAL RESULT CALLED TO, READ BACK BY AND VERIFIED WITH: CHRISTINE KATSOUDAS 02/22/17 0737 SGD    Culture (A)  Final    STAPHYLOCOCCUS SPECIES (COAGULASE NEGATIVE) THE SIGNIFICANCE OF ISOLATING THIS ORGANISM FROM A SINGLE SET OF BLOOD CULTURES WHEN MULTIPLE SETS ARE DRAWN IS UNCERTAIN. PLEASE NOTIFY THE MICROBIOLOGY DEPARTMENT WITHIN ONE WEEK IF SPECIATION AND SENSITIVITIES ARE REQUIRED. Performed at [facility], [place] [address] [place], [place] [address]    Report Status 02/24/2017 FINAL  Final  Blood culture (routine x 2)     Status: None   Collection Time: 02/21/17  8:05 AM  Result Value Ref Range Status   Specimen Description BLOOD R ARM  Final   Special Requests   Final    BOTTLES DRAWN AEROBIC AND ANAEROBIC Blood Culture results may not be optimal due to an excessive volume of blood received in culture bottles   Culture NO GROWTH 5 DAYS  Final   Report Status 02/26/2017 FINAL  Final  Blood Culture ID Panel (Reflexed)     Status: Abnormal   Collection Time: 02/21/17  8:05 AM  Result Value Ref Range Status   Enterococcus species NOT DETECTED NOT DETECTED Final   Listeria monocytogenes NOT DETECTED NOT DETECTED Final   Staphylococcus species DETECTED (A) NOT DETECTED Final    Comment: Methicillin (oxacillin) susceptible coagulase negative staphylococcus. Possible blood culture contaminant (unless isolated from more than one blood culture draw or clinical case suggests pathogenicity). No antibiotic treatment is indicated for blood  culture contaminants. CRITICAL RESULT CALLED TO, READ BACK BY AND VERIFIED WITH: CHRISTINE KATSOUDAS 02/22/17 0737 SGD    Staphylococcus aureus (BCID) NOT DETECTED NOT DETECTED Final   Methicillin resistance NOT DETECTED NOT DETECTED Final   Streptococcus species NOT DETECTED NOT DETECTED Final   Streptococcus  agalactiae NOT DETECTED NOT DETECTED Final   Streptococcus pneumoniae NOT DETECTED NOT DETECTED Final   Streptococcus pyogenes NOT DETECTED NOT DETECTED Final   Acinetobacter baumannii NOT DETECTED NOT DETECTED Final   Enterobacteriaceae species NOT DETECTED NOT DETECTED Final   Enterobacter cloacae complex NOT DETECTED NOT DETECTED Final   Escherichia coli NOT DETECTED NOT DETECTED Final   Klebsiella oxytoca NOT DETECTED NOT DETECTED Final   Klebsiella pneumoniae NOT DETECTED NOT DETECTED Final   Proteus species NOT DETECTED NOT DETECTED Final   Serratia marcescens NOT DETECTED NOT DETECTED Final   Haemophilus influenzae NOT DETECTED NOT DETECTED Final   Neisseria meningitidis NOT DETECTED  NOT DETECTED Final   Pseudomonas aeruginosa NOT DETECTED NOT DETECTED Final   Candida albicans NOT DETECTED NOT DETECTED Final   Candida glabrata NOT DETECTED NOT DETECTED Final   Candida krusei NOT DETECTED NOT DETECTED Final   Candida parapsilosis NOT DETECTED NOT DETECTED Final   Candida tropicalis NOT DETECTED NOT DETECTED Final  MRSA PCR Screening     Status: None   Collection Time: 02/21/17 10:07 AM  Result Value Ref Range Status   MRSA by PCR NEGATIVE NEGATIVE Final    Comment:        The GeneXpert MRSA Assay (FDA approved for NASAL specimens only), is one component of a comprehensive MRSA colonization surveillance program. It is not intended to diagnose MRSA infection nor to guide or monitor treatment for MRSA infections.     RADIOLOGY:  No results found.  EKG:   Orders placed or performed during the hospital encounter of 07/25/18  . EKG 12-Lead  . EKG 12-Lead  . ED EKG within 10 minutes  . ED EKG within 10 minutes      Management plans discussed with the patient, family and they are in agreement.  CODE STATUS:     Code Status Orders  (From admission, onward)         Start     Ordered   07/26/18 0118  Do not attempt resuscitation (DNR)  Continuous    Question  Answer Comment  In the event of cardiac or respiratory ARREST Do not call a "code blue"   In the event of cardiac or respiratory ARREST Do not perform Intubation, CPR, defibrillation or ACLS   In the event of cardiac or respiratory ARREST Use medication by any route, position, wound care, and other measures to relive pain and suffering. May use oxygen, suction and manual treatment of airway obstruction as needed for comfort.   Comments nurse may pronounce      07/26/18 0117        Code Status History    Date Active Date Inactive Code Status Order ID Comments User Context   06/17/2018 2235 06/20/2018 2038 DNR [id number]  Sela Hua, MD Inpatient   05/20/2018 1232 05/24/2018 1803 DNR [id number]  Loletha Grayer, MD ED   03/24/2017 0552 03/26/2017 1600 DNR [id number]  Norval Morton, MD ED   02/21/2017 0908 02/21/2017 1510 DNR [id number]  Dustin Flock, MD Inpatient   01/15/2017 2243 01/17/2017 1512 Full Code [id number]  Saundra Shelling, MD ED   12/15/2016 2054 12/16/2016 2121 Full Code [id number]  Dustin Flock, MD Inpatient   12/15/2016 1840 12/15/2016 2054 DNR [id number]  Dustin Flock, MD ED   Aug 28, 202018 2122 11/02/2016 1740 DNR [id number]  Loletha Grayer, MD ED   03/17/2012 0542 03/19/2012 2014 Full Code [id number]  Marylou Mccoy, RN Inpatient   01/30/2012 1656 02/03/2012 1859 Full Code [id number]  Murlean Iba, MD Inpatient   08/11/2011 1803 08/13/2011 1835 Full Code [id number]  Loyal Gambler, RN Inpatient      TOTAL TIME TAKING CARE OF THIS PATIENT: 40 minutes.    Avel Peace Salary M.D on 07/29/2018 at 10:04 AM  Between 7am to 6pm - Pager - [phone]  After 6pm go to [email or web address] - password [email or web address] [facility]  Office  [phone]  CC: Primary care physician; Patient, No Pcp Per   Note: This dictation was prepared with Dragon dictation along with smaller phrase technology. Any transcriptional errors that result from this process are unintentional.

## 2018-08-06 ENCOUNTER — Telehealth: Payer: Self-pay

## 2018-08-06 NOTE — Telephone Encounter (Signed)
EMMI flagged for loss of interest and sad hopeless anxious feelings. CSW attempted to contact patient regarding these concerns. Patient did not answer phone call. CSW left a voicemail requesting a call back. CSW will attempt to call patient again when able.   [place], [place]

## 2018-08-09 ENCOUNTER — Emergency Department: Payer: Medicare Other

## 2018-08-09 ENCOUNTER — Other Ambulatory Visit: Payer: Self-pay

## 2018-08-09 ENCOUNTER — Inpatient Hospital Stay
Admission: EM | Admit: 2018-08-09 | Discharge: 2018-08-11 | DRG: 190 | Disposition: A | Discharge status: Home / Self Care | Payer: Medicare Other | Attending: Internal Medicine | Admitting: Internal Medicine

## 2018-08-09 ENCOUNTER — Encounter: Payer: Self-pay | Admitting: Emergency Medicine

## 2018-08-09 DIAGNOSIS — Z590 Homelessness: Secondary | ICD-10-CM

## 2018-08-09 DIAGNOSIS — J189 Pneumonia, unspecified organism: Secondary | ICD-10-CM | POA: Diagnosis present

## 2018-08-09 DIAGNOSIS — Z7951 Long term (current) use of inhaled steroids: Secondary | ICD-10-CM | POA: Diagnosis not present

## 2018-08-09 DIAGNOSIS — Z7982 Long term (current) use of aspirin: Secondary | ICD-10-CM | POA: Diagnosis not present

## 2018-08-09 DIAGNOSIS — Z79899 Other long term (current) drug therapy: Secondary | ICD-10-CM

## 2018-08-09 DIAGNOSIS — I10 Essential (primary) hypertension: Secondary | ICD-10-CM | POA: Diagnosis not present

## 2018-08-09 DIAGNOSIS — G2581 Restless legs syndrome: Secondary | ICD-10-CM | POA: Diagnosis present

## 2018-08-09 DIAGNOSIS — E785 Hyperlipidemia, unspecified: Secondary | ICD-10-CM | POA: Diagnosis present

## 2018-08-09 DIAGNOSIS — E119 Type 2 diabetes mellitus without complications: Secondary | ICD-10-CM | POA: Diagnosis not present

## 2018-08-09 DIAGNOSIS — Z9981 Dependence on supplemental oxygen: Secondary | ICD-10-CM

## 2018-08-09 DIAGNOSIS — J439 Emphysema, unspecified: Secondary | ICD-10-CM | POA: Diagnosis present

## 2018-08-09 DIAGNOSIS — N400 Enlarged prostate without lower urinary tract symptoms: Secondary | ICD-10-CM | POA: Diagnosis present

## 2018-08-09 DIAGNOSIS — F17210 Nicotine dependence, cigarettes, uncomplicated: Secondary | ICD-10-CM | POA: Diagnosis present

## 2018-08-09 DIAGNOSIS — J441 Chronic obstructive pulmonary disease with (acute) exacerbation: Secondary | ICD-10-CM

## 2018-08-09 DIAGNOSIS — Z66 Do not resuscitate: Secondary | ICD-10-CM | POA: Diagnosis present

## 2018-08-09 DIAGNOSIS — Z7984 Long term (current) use of oral hypoglycemic drugs: Secondary | ICD-10-CM | POA: Diagnosis not present

## 2018-08-09 DIAGNOSIS — K219 Gastro-esophageal reflux disease without esophagitis: Secondary | ICD-10-CM | POA: Diagnosis present

## 2018-08-09 DIAGNOSIS — Z85038 Personal history of other malignant neoplasm of large intestine: Secondary | ICD-10-CM

## 2018-08-09 LAB — CBC
HEMATOCRIT: 49 % (ref 39.0–52.0)
HEMOGLOBIN: 15.2 g/dL (ref 13.0–17.0)
MCH: 29.1 pg (ref 26.0–34.0)
MCHC: 31 g/dL (ref 30.0–36.0)
MCV: 93.7 fL (ref 80.0–100.0)
Platelets: 227 10*3/uL (ref 150–400)
RBC: 5.23 MIL/uL (ref 4.22–5.81)
RDW: 15.8 % — ABNORMAL HIGH (ref 11.5–15.5)
WBC: 11.3 10*3/uL — ABNORMAL HIGH (ref 4.0–10.5)
nRBC: 0 % (ref 0.0–0.2)

## 2018-08-09 LAB — GLUCOSE, CAPILLARY
GLUCOSE-CAPILLARY: 317 mg/dL — AB (ref 70–99)
Glucose-Capillary: 245 mg/dL — ABNORMAL HIGH (ref 70–99)

## 2018-08-09 LAB — COMPREHENSIVE METABOLIC PANEL
ALBUMIN: 3.7 g/dL (ref 3.5–5.0)
ALT: 21 U/L (ref 0–44)
AST: 14 U/L — AB (ref 15–41)
Alkaline Phosphatase: 75 U/L (ref 38–126)
Anion gap: 13 (ref 5–15)
BUN: 21 mg/dL (ref 8–23)
CO2: 31 mmol/L (ref 22–32)
CREATININE: 0.84 mg/dL (ref 0.61–1.24)
Calcium: 9.4 mg/dL (ref 8.9–10.3)
Chloride: 100 mmol/L (ref 98–111)
GFR calc Af Amer: >60 mL/min (ref >60)
GFR calc non Af Amer: >60 mL/min (ref >60)
GLUCOSE: 144 mg/dL — AB (ref 70–99)
Potassium: 3.8 mmol/L (ref 3.5–5.1)
SODIUM: 144 mmol/L (ref 135–145)
Total Bilirubin: 1.7 mg/dL — ABNORMAL HIGH (ref 0.3–1.2)
Total Protein: 6.9 g/dL (ref 6.5–8.1)

## 2018-08-09 LAB — BRAIN NATRIURETIC PEPTIDE: B Natriuretic Peptide: 54 pg/mL (ref 0.0–100.0)

## 2018-08-09 LAB — TROPONIN I: Troponin I: <0.03 ng/mL (ref <0.03)

## 2018-08-09 MED ORDER — METHYLPREDNISOLONE SODIUM SUCC 125 MG IJ SOLR
125.0000 mg | Freq: Once | INTRAMUSCULAR | Status: AC
  Administered 2018-08-09: 125 mg via INTRAVENOUS
  Filled 2018-08-09: qty 2

## 2018-08-09 MED ORDER — POLYETHYLENE GLYCOL 3350 17 G PO PACK: 17.0000 g | PACK | Freq: Every day | ORAL | Status: DC | PRN

## 2018-08-09 MED ORDER — SODIUM CHLORIDE 0.9 % IV SOLN
INTRAVENOUS | Status: DC | PRN
  Administered 2018-08-09: 500 mL via INTRAVENOUS

## 2018-08-09 MED ORDER — ACETAMINOPHEN 325 MG PO TABS: 650.0000 mg | ORAL_TABLET | Freq: Four times a day (QID) | ORAL | Status: DC | PRN

## 2018-08-09 MED ORDER — ONDANSETRON HCL 4 MG/2ML IJ SOLN: 4.0000 mg | Freq: Four times a day (QID) | INTRAMUSCULAR | Status: DC | PRN

## 2018-08-09 MED ORDER — METOPROLOL TARTRATE 50 MG PO TABS
50.0000 mg | ORAL_TABLET | Freq: Two times a day (BID) | ORAL | Status: DC
  Administered 2018-08-09 – 2018-08-11 (×4): 50 mg via ORAL
  Filled 2018-08-09 (×4): qty 1

## 2018-08-09 MED ORDER — IPRATROPIUM-ALBUTEROL 0.5-2.5 (3) MG/3ML IN SOLN
3.0000 mL | RESPIRATORY_TRACT | Status: DC
  Administered 2018-08-09 – 2018-08-11 (×8): 3 mL via RESPIRATORY_TRACT
  Filled 2018-08-09 (×9): qty 3

## 2018-08-09 MED ORDER — ONDANSETRON HCL 4 MG PO TABS: 4.0000 mg | ORAL_TABLET | Freq: Four times a day (QID) | ORAL | Status: DC | PRN

## 2018-08-09 MED ORDER — INSULIN ASPART 100 UNIT/ML ~~LOC~~ SOLN
0.0000 [IU] | Freq: Every day | SUBCUTANEOUS | Status: DC
  Administered 2018-08-09: 2 [IU] via SUBCUTANEOUS
  Filled 2018-08-09: qty 1

## 2018-08-09 MED ORDER — SODIUM CHLORIDE 0.9% FLUSH
3.0000 mL | Freq: Two times a day (BID) | INTRAVENOUS | Status: DC
  Administered 2018-08-09 – 2018-08-11 (×3): 3 mL via INTRAVENOUS

## 2018-08-09 MED ORDER — ASPIRIN 81 MG PO CHEW
81.0000 mg | CHEWABLE_TABLET | Freq: Every day | ORAL | Status: DC
  Administered 2018-08-09 – 2018-08-11 (×3): 81 mg via ORAL
  Filled 2018-08-09 (×3): qty 1

## 2018-08-09 MED ORDER — LEVOFLOXACIN IN D5W 500 MG/100ML IV SOLN
500.0000 mg | INTRAVENOUS | Status: DC
  Administered 2018-08-09 – 2018-08-10 (×2): 500 mg via INTRAVENOUS
  Filled 2018-08-09 (×2): qty 100

## 2018-08-09 MED ORDER — INSULIN ASPART 100 UNIT/ML ~~LOC~~ SOLN
0.0000 [IU] | Freq: Three times a day (TID) | SUBCUTANEOUS | Status: DC
  Administered 2018-08-09: 15 [IU] via SUBCUTANEOUS
  Administered 2018-08-10: 11 [IU] via SUBCUTANEOUS
  Administered 2018-08-10 (×2): 4 [IU] via SUBCUTANEOUS
  Administered 2018-08-11: 3 [IU] via SUBCUTANEOUS
  Filled 2018-08-09 (×5): qty 1

## 2018-08-09 MED ORDER — IPRATROPIUM-ALBUTEROL 0.5-2.5 (3) MG/3ML IN SOLN
3.0000 mL | Freq: Once | RESPIRATORY_TRACT | Status: AC
  Administered 2018-08-09: 3 mL via RESPIRATORY_TRACT
  Filled 2018-08-09: qty 3

## 2018-08-09 MED ORDER — ENOXAPARIN SODIUM 40 MG/0.4ML ~~LOC~~ SOLN
40.0000 mg | SUBCUTANEOUS | Status: DC
  Administered 2018-08-09 – 2018-08-10 (×2): 40 mg via SUBCUTANEOUS
  Filled 2018-08-09 (×2): qty 0.4

## 2018-08-09 MED ORDER — FUROSEMIDE 20 MG PO TABS
20.0000 mg | ORAL_TABLET | Freq: Every day | ORAL | Status: DC | PRN
  Administered 2018-08-10: 20 mg via ORAL
  Filled 2018-08-09: qty 1

## 2018-08-09 MED ORDER — METHYLPREDNISOLONE SODIUM SUCC 40 MG IJ SOLR
40.0000 mg | Freq: Three times a day (TID) | INTRAMUSCULAR | Status: DC
  Administered 2018-08-09: 40 mg via INTRAVENOUS
  Filled 2018-08-09 (×3): qty 1

## 2018-08-09 MED ORDER — MIDODRINE HCL 5 MG PO TABS
5.0000 mg | ORAL_TABLET | Freq: Three times a day (TID) | ORAL | Status: DC
  Administered 2018-08-10 – 2018-08-11 (×4): 5 mg via ORAL
  Filled 2018-08-09 (×4): qty 1

## 2018-08-09 MED ORDER — GUAIFENESIN ER 600 MG PO TB12
600.0000 mg | ORAL_TABLET | Freq: Two times a day (BID) | ORAL | Status: DC | PRN
  Administered 2018-08-10: 1200 mg via ORAL
  Filled 2018-08-09: qty 2

## 2018-08-09 MED ORDER — HYDROCODONE-ACETAMINOPHEN 5-325 MG PO TABS: 1.0000 | ORAL_TABLET | ORAL | Status: DC | PRN

## 2018-08-09 MED ORDER — MOMETASONE FURO-FORMOTEROL FUM 200-5 MCG/ACT IN AERO
2.0000 | INHALATION_SPRAY | Freq: Two times a day (BID) | RESPIRATORY_TRACT | Status: DC
  Administered 2018-08-09 – 2018-08-11 (×4): 2 via RESPIRATORY_TRACT
  Filled 2018-08-09: qty 8.8

## 2018-08-09 MED ORDER — ACETAMINOPHEN 650 MG RE SUPP: 650.0000 mg | Freq: Four times a day (QID) | RECTAL | Status: DC | PRN

## 2018-08-09 MED ORDER — ATORVASTATIN CALCIUM 20 MG PO TABS
40.0000 mg | ORAL_TABLET | Freq: Every day | ORAL | Status: DC
  Administered 2018-08-09 – 2018-08-11 (×3): 40 mg via ORAL
  Filled 2018-08-09 (×3): qty 2

## 2018-08-09 MED ORDER — TAMSULOSIN HCL 0.4 MG PO CAPS
0.4000 mg | ORAL_CAPSULE | Freq: Every day | ORAL | Status: DC
  Administered 2018-08-09 – 2018-08-11 (×3): 0.4 mg via ORAL
  Filled 2018-08-09 (×3): qty 1

## 2018-08-09 MED ORDER — SODIUM CHLORIDE 0.9 % IV SOLN: 250.0000 mL | INTRAVENOUS | Status: DC | PRN

## 2018-08-09 NOTE — Progress Notes (Signed)
When this RN asked pt about statements made to other RN (see previous note) pt became very upset. Pt stated "that nurse is a expletive liar, I would never kill myself because I was raised that way. I wish the [facility]ita [facility] would take me home, but that's it." Pt adamantly denies SI. Dr. Estanislado Pandy had been previously notified of pt's statements, and notified again with this clarification- he spoke with pt on the phone. No other interventions.

## 2018-08-09 NOTE — Progress Notes (Signed)
Pt stated that he would like to tell this RN "Something, if you promise to not tell anyone".  RN did not agree to this promise, but encouraged patient to tell us anything he needs assistance with.  Pt proceeded to tell RN that he would like to go to heaven to see "Our Father", and that he is ready to die.  Pt continued to tell RN that everyone he cares about is already in heaven including an 82-year old daughter, his wife, and two brothers.  Pt states that the Bible talks about it being "wrong " to do anything to harm himself, so he will not do anything unless in self-defense.  He then stated that people are wrong when they say "we cannot shorten our days" and explained that if he takes a 44 Magnum and puts it against the side of his head, he can shorten his days, while using his fingers to act this out.  Pt then stated that he knows we can get him "through tonight because it's what you do", but this does not mean he could not "shorten his days" tomorrow.  Pt stated again that he knows he will not be forgiven on his "final judgement day" if he harms himself.  Reported to pt's primary RN, Aliene Beams, who contacted pt's MD. Reed Breech, RN 08/09/2018 6:47 PM

## 2018-08-09 NOTE — ED Triage Notes (Signed)
Pt arrived via ems with concerns of shortness of breath and weakness that started last night. Pt was recently admitted to the hospital. Pt's o2 saturation 97% on room air. Pt denies any pain.

## 2018-08-09 NOTE — Progress Notes (Signed)
Inpatient Diabetes Program Recommendations  AACE/ADA: New Consensus Statement on Inpatient Glycemic Control (2015)  Target Ranges:  Prepandial:   less than 140 mg/dL      Peak postprandial:   less than 180 mg/dL (1-2 hours)      Critically ill patients:  140 - 180 mg/dL    Results for [name] [name], [name] [name] (MRN [id number]) as of 08/09/2018 15:15  Ref. Range 08/09/2018 08:43  Glucose Latest Ref Range: 70 - 99 mg/dL 144 (H)   Results for [name], [name] (MRN [id number]) as of 08/09/2018 15:15  Ref. Range 07/26/2018 04:29  Hemoglobin A1C Latest Ref Range: 4.8 - 5.6 % 7.1 (H)    Admit with: SOB/ COPD/ [place]  History: DM, COPD  Home DM Meds: Metformin 500 mg BID  Current Orders: Novolog Resistant Correction Scale/ SSI (0-20 units) TID AC + HS     Received 125 mg Solumedrol X 1 dose at 1:30pm.  To start Solumedrol 40 mg Q8 hours.  Has orders for Novolog Resistant SSI to be started as soon as orders are released.    --Will follow patient during hospitalization--  Wyn Quaker RN, MSN, CDE Diabetes Coordinator Inpatient Glycemic Control Team Team Pager: [phone] (8a-5p)

## 2018-08-09 NOTE — H&P (Signed)
[facility] NAME: [name] [name]    MR#:  [id number]  DATE OF BIRTH:  1934-07-03  DATE OF ADMISSION:  08/09/2018  PRIMARY CARE PHYSICIAN: Patient, No Pcp Per   REQUESTING/REFERRING PHYSICIAN: dr Reita Cliche  CHIEF COMPLAINT:   SOB HISTORY OF PRESENT ILLNESS:  [name] [name]  is a 84 y.o. male with a known history of COPD who wears oxygen at night and diabetes who presents to the emergency room due to increasing shortness of breath and wheezing.  Patient was recently discharged from the hospital with COPD exacerbation.  He reports that when he went home he was feeling fine however over the past 2 days he has had increasing shortness of breath, wheezing and cough.  In the emergency room he received nebulizer treatment and IV steroids however he continues to have shortness of breath and wheezing.  PAST MEDICAL HISTORY:   Past Medical History:  Diagnosis Date  . ABSCESS 12/03/2009  . ABSCESS, FINGER 04/07/2010  . ANXIETY 11/03/2009  . ASTHMA 11/03/2009  . CHRONIC OBSTRUCTIVE PULMONARY DISEASE, ACUTE EXACERBATION 11/03/2009  . Colon cancer ([employer])   . COPD 11/03/2009  . DEPRESSION 11/03/2009  . DIABETES MELLITUS, TYPE II 11/03/2009  . [place] DISEASE, LUMBAR 11/03/2009  . EMPHYSEMA, BULLOUS 11/03/2009  . GERD 11/03/2009  . HYPERLIPIDEMIA 11/03/2009  . HYPERTENSION 11/03/2009  . Kidney stones 01/30/12   "I've had them 7 times; always have passed them"  . PEPTIC ULCER DISEASE 11/03/2009  . Pneumonia   . RASH-NONVESICULAR 11/03/2009  . RESTLESS LEG SYNDROME 11/03/2009  . Shortness of breath    "sometimes; at any time"  . SPINAL STENOSIS, LUMBAR 11/03/2009    PAST SURGICAL HISTORY:   Past Surgical History:  Procedure Laterality Date  . Colon cancer surgery    . [place]   left  . INGUINAL HERNIA REPAIR  10/2011   left  . ROTATOR CUFF REPAIR  2003   left  . TONSILLECTOMY  1960    SOCIAL HISTORY:   Social History   Tobacco Use  .  Smoking status: Current Every Day Smoker    Packs/day: 1.00    Years: 41.00    Pack years: 41.00    Types: Cigarettes  . Smokeless tobacco: Never Used  . Tobacco comment: "stopped smoking 04/21/1991 then restarted in 2012"  Substance Use Topics  . Alcohol use: No    FAMILY HISTORY:   Family History  Problem Relation Age of Onset  . Heart disease Father   . Heart disease Mother   . Cancer Brother        lung    DRUG ALLERGIES:  No Known Allergies  REVIEW OF SYSTEMS:   Review of Systems  Constitutional: Positive for malaise/fatigue. Negative for chills and fever.  HENT: Negative.  Negative for ear discharge, ear pain, hearing loss, nosebleeds and sore throat.   Eyes: Negative.  Negative for blurred vision and pain.  Respiratory: Positive for cough, shortness of breath and wheezing. Negative for hemoptysis and sputum production.   Cardiovascular: Negative.  Negative for chest pain, palpitations and leg swelling.  Gastrointestinal: Negative.  Negative for abdominal pain, blood in stool, diarrhea, nausea and vomiting.  Genitourinary: Negative.  Negative for dysuria.  Musculoskeletal: Negative.  Negative for back pain.  Skin: Negative.   Neurological: Negative for dizziness, tremors, speech change, focal weakness, seizures and headaches.  Endo/Heme/Allergies: Negative.  Does not bruise/bleed easily.  Psychiatric/Behavioral: Negative.  Negative for depression,  hallucinations and suicidal ideas.    MEDICATIONS AT HOME:   Prior to Admission medications   Medication Sig Start Date End Date Taking? Authorizing Provider  albuterol (PROVENTIL HFA;VENTOLIN HFA) 108 (90 Base) MCG/ACT inhaler Inhale 2 puffs into the lungs every 6 (six) hours as needed. 07/29/18   Salary, Avel Peace, MD  aspirin 81 MG chewable tablet Chew 1 tablet (81 mg total) by mouth daily. 07/29/18   Salary, Avel Peace, MD  atorvastatin (LIPITOR) 40 MG tablet Take 1 tablet (40 mg total) by mouth daily. 07/29/18    Salary, Avel Peace, MD  cefdinir (OMNICEF) 300 MG capsule Take 1 capsule (300 mg total) by mouth 2 (two) times daily. 07/29/18   Salary, Avel Peace, MD  Fluticasone-Salmeterol (ADVAIR) 250-50 MCG/DOSE AEPB Inhale 1 puff into the lungs 2 (two) times daily. 07/29/18   Salary, Avel Peace, MD  furosemide (LASIX) 20 MG tablet Take 1 tablet (20 mg total) by mouth daily as needed for fluid or edema. 07/29/18   Salary, Avel Peace, MD  guaiFENesin (MUCINEX) 600 MG 12 hr tablet Take 1-2 tablets (600-1,200 mg total) by mouth 2 (two) times daily as needed for cough or to loosen phlegm. 07/29/18   Salary, Holly Bodily D, MD  metFORMIN (GLUCOPHAGE) 500 MG tablet Take 1 tablet (500 mg total) by mouth 2 (two) times daily with a meal. 07/29/18   Salary, Avel Peace, MD  metoprolol tartrate (LOPRESSOR) 50 MG tablet Take 1 tablet (50 mg total) by mouth 2 (two) times daily. 07/29/18   Salary, Avel Peace, MD  midodrine (PROAMATINE) 5 MG tablet Take 1 tablet (5 mg total) by mouth 3 (three) times daily with meals. 07/29/18   Salary, Holly Bodily D, MD  predniSONE (DELTASONE) 50 MG tablet One daily 07/29/18   Salary, Holly Bodily D, MD  tamsulosin (FLOMAX) 0.4 MG CAPS capsule Take 1 capsule (0.4 mg total) by mouth daily. 07/29/18   Salary, Avel Peace, MD      VITAL SIGNS:  Blood pressure 131/73, pulse (!) 102, temperature (!) 97.5 F (36.4 C), temperature source Oral, resp. rate (!) 22, height 5\' 10"  (1.778 m), weight 76 kg, SpO2 96 %.  PHYSICAL EXAMINATION:   Physical Exam  Constitutional: He is oriented to person, place, and time. No distress.  HENT:  Head: Normocephalic.  Eyes: No scleral icterus.  Neck: Normal range of motion. Neck supple. No JVD present. No tracheal deviation present.  Cardiovascular: Normal rate, regular rhythm and normal heart sounds. Exam reveals no gallop and no friction rub.  No murmur heard. Pulmonary/Chest: Effort normal and breath sounds normal. No respiratory distress. He has no wheezes. He has no rales.  He exhibits no tenderness.  Abdominal: Soft. Bowel sounds are normal. He exhibits no distension and no mass. There is no tenderness. There is no rebound and no guarding.  Musculoskeletal: Normal range of motion. He exhibits no edema.  Neurological: He is alert and oriented to person, place, and time.  Skin: Skin is warm. No rash noted. No erythema.  Psychiatric: Judgment normal.      LABORATORY PANEL:   CBC Recent Labs  Lab 08/09/18 0843  WBC 11.3*  HGB 15.2  HCT 49.0  PLT 227   ------------------------------------------------------------------------------------------------------------------  Chemistries  Recent Labs  Lab 08/09/18 0843  NA 144  K 3.8  CL 100  CO2 31  GLUCOSE 144*  BUN 21  CREATININE 0.84  CALCIUM 9.4  AST 14*  ALT 21  ALKPHOS 75  BILITOT 1.7*   ------------------------------------------------------------------------------------------------------------------  Cardiac  Enzymes Recent Labs  Lab 08/09/18 0843  TROPONINI <0.03   ------------------------------------------------------------------------------------------------------------------  RADIOLOGY:  Dg Chest 2 View  Result Date: 08/09/2018 CLINICAL DATA:  Shortness of breath and weakness today. Productive cough. EXAM: CHEST - 2 VIEW COMPARISON:  07/25/2018 FINDINGS: Heart size is normal. Chronic aortic atherosclerosis. Chronic emphysema asymmetrically more pronounced in the left upper lung than the right. Chronic crowding of markings at the lung bases. No evidence of consolidation or lobar collapse. Low level pneumonia could be hidden within the chronic markings. No effusion. No acute bone finding. IMPRESSION: Emphysema, asymmetric on the left. Crowding of markings at the bases. No consolidation or collapse identified. Low level infectious changes could be hidden within the chronic abnormal markings. Electronically Signed   By: Nelson Chimes M.D.   On: 08/09/2018 09:15    EKG:   Shaky rhythm  however appears to have sinus rhythm without ST elevation or depression although EKG is reading atrial fibrillation I do not believe it to be A. fib. IMPRESSION AND PLAN:   84 year old male with history of diabetes, tobacco dependence and COPD who presents with shortness of breath and wheezing.  1.  Acute exacerbation of COPD with pneumonia: Start IV steroids 40 mg every 8 of Solu-Medrol Continue nebs and inhalers Start Levaquin for pneumonia Oxygen at night and in the daytime if needed.  2.  Diabetes: Sliding scale initiated  3.  Essential hypertension: Continue metoprolol 4.  BPH: Continue Flomax 5.  Hyperlipidemia: Continue statin  6.  Homelessness: Case management consultation requested  7. Tobacco dependence: Patient is encouraged to quit smoking. Counseling was provided for 4 minutes.  All the records are reviewed and case discussed with ED provider. Management plans discussed with the patient and he is in agreement  CODE STATUS: dnr  TOTAL TIME TAKING CARE OF THIS PATIENT: 48 minutes.    Falicia Lizotte M.D on 08/09/2018 at 1:57 PM  Between 7am to 6pm - Pager - [phone]  After 6pm go to [email or web address] - password [email or web address] [facility]  Office  [phone]  CC: Primary care physician; Patient, No Pcp Per

## 2018-08-09 NOTE — Care Management Note (Addendum)
Case Management Note  Patient Details  Name: [name] [name] MRN: [id number] Date of Birth: 1934/04/17  Subjective/Objective:   Patient is being admitted for COPD exacerbation.  Patient reports that he has been living in his car and in hotel rooms.  He reports his car is brand new and reliable and he does drive.  Patient is on chronic O2 through [place] but he reports his oxygen concentrator is broken.  RNCM assessment for frequent recent admissions.  Patient reports he has been homeless since August.  Patient reports that something happened back in August but he does not want to talk about what happened.  Patient receives Social Security monthly.  He is able to afford his medications.  RNCM asked patient about family and he said that he had family but he did not want them involved.  RNCM will place Spiritual consult for Advanced directive planning.  Patient reports he does have a son and he contacted son from [facility] phone but he did not want his son's number placed in chart- the son should not be called unless it is an emergency.  Son's number [phone].  Patient was worried about his car being towed it is currently at a rest area.  He was able to contact son and get someone to pick up his car and bring it to the visitor's parking lot at [facility].  Patient does not currently have a PCP he is set up with an appointment December 12th at [facility] with Dr. Morrison Old- it is a new patient appointment he has not been established yet.    Patient is independent in ADL's and has no equipment.   RNCM will cont to follow to assist with discharge planning.  [phone]               Action/Plan:   Expected Discharge Date:                  Expected Discharge Plan:  Home/Self Care  In-House Referral:  Clinical Social Work  Discharge planning Services  CM Consult  Post Acute Care Choice:    Choice offered to:     DME Arranged:    DME Agency:     HH Arranged:    HH Agency:     Status of  Service:  In process, will continue to follow  If discussed at Long Length of Stay Meetings, dates discussed:    Additional Comments:  [name] Hutching, RN 08/09/2018, 2:44 PM

## 2018-08-09 NOTE — ED Provider Notes (Signed)
[facility] Emergency Department Provider Note ____________________________________________   I have reviewed the triage vital signs and the triage nursing note.  HISTORY  Chief Complaint Shortness of Breath   Historian Patient  HPI [name] [name] is a 84 y.o. male with a history of COPD as well as CHF, states that he lives from [place] room to motel room, presents with maybe 2 or 3 days of worsening shortness of breath and worsening leg swelling and trouble getting around because his legs are swollen and heavy.  Reports a history of A. fib, states he does not take a blood thinner because it made him bruise easily.  Denies chest pain.  Denies coughing.  Denies any productive sputum.  Denies fevers.  Denies one-sided leg swelling, is equal both sides.       Past Medical History:  Diagnosis Date  . ABSCESS 12/03/2009  . ABSCESS, FINGER 04/07/2010  . ANXIETY 11/03/2009  . ASTHMA 11/03/2009  . CHRONIC OBSTRUCTIVE PULMONARY DISEASE, ACUTE EXACERBATION 11/03/2009  . Colon cancer ([place])   . COPD 11/03/2009  . DEPRESSION 11/03/2009  . DIABETES MELLITUS, TYPE II 11/03/2009  . [place] DISEASE, LUMBAR 11/03/2009  . EMPHYSEMA, BULLOUS 11/03/2009  . GERD 11/03/2009  . HYPERLIPIDEMIA 11/03/2009  . HYPERTENSION 11/03/2009  . Kidney stones 01/30/12   "I've had them 7 times; always have passed them"  . PEPTIC ULCER DISEASE 11/03/2009  . Pneumonia   . RASH-NONVESICULAR 11/03/2009  . RESTLESS LEG SYNDROME 11/03/2009  . Shortness of breath    "sometimes; at any time"  . SPINAL STENOSIS, LUMBAR 11/03/2009    Patient Active Problem List   Diagnosis Date Noted  . COPD with acute exacerbation ([place]) 07/26/2018  . Congestive heart failure ([place])   . Palliative care by specialist   . DNR (do not resuscitate)   . COPD exacerbation ([place]) 03/24/2017  . Hypoxia 03/24/2017  . Diabetes mellitus type 2 in nonobese ([place]) 03/24/2017  . Polysubstance abuse ([place]) 03/24/2017  . Pressure injury  of skin 03/24/2017  . Overdose of benzodiazepine 01/16/2017  . Unresponsiveness   . HCAP (healthcare-associated pneumonia)   . Respiratory failure ([place]) 01/15/2017  . CVA (cerebral vascular accident) ([place]) 12/15/2016  . Chest pain Aug 14, 202018  . PNA (pneumonia) 03/17/2012  . Weakness generalized 03/17/2012  . Generalized weakness 01/30/2012  . Fall at home 01/30/2012  . Physical deconditioning 01/30/2012  . Nausea vomiting and diarrhea 01/15/2012  . UTI (urinary tract infection) 01/15/2012  . Cocaine abuse ([place]) 08/11/2011  . Tobacco abuse 08/11/2011  . Orthostasis 12/23/2010  . Dehydration 12/23/2010  . Abdominal pain, other specified site 12/23/2010  . Dizziness 12/23/2010  . Weight loss 12/23/2010  . Left lumbar radiculopathy 12/23/2010  . DIABETES MELLITUS, TYPE II 11/03/2009  . HYPERLIPIDEMIA 11/03/2009  . ANXIETY 11/03/2009  . DEPRESSION 11/03/2009  . RESTLESS LEG SYNDROME 11/03/2009  . HYPERTENSION 11/03/2009  . EMPHYSEMA, BULLOUS 11/03/2009  . ASTHMA 11/03/2009  . COPD 11/03/2009  . GERD 11/03/2009  . PEPTIC ULCER DISEASE 11/03/2009  . [place] DISEASE, LUMBAR 11/03/2009  . SPINAL STENOSIS, LUMBAR 11/03/2009  . NEPHROLITHIASIS, HX OF 11/03/2009    Past Surgical History:  Procedure Laterality Date  . Colon cancer surgery    . [place]   left  . INGUINAL HERNIA REPAIR  10/2011   left  . ROTATOR CUFF REPAIR  2003   left  . TONSILLECTOMY  1960    Prior to Admission medications   Medication Sig Start Date End  Date Taking? Authorizing Provider  albuterol (PROVENTIL HFA;VENTOLIN HFA) 108 (90 Base) MCG/ACT inhaler Inhale 2 puffs into the lungs every 6 (six) hours as needed. 07/29/18   Salary, Avel Peace, MD  aspirin 81 MG chewable tablet Chew 1 tablet (81 mg total) by mouth daily. 07/29/18   Salary, Avel Peace, MD  atorvastatin (LIPITOR) 40 MG tablet Take 1 tablet (40 mg total) by mouth daily. 07/29/18   Salary, Avel Peace, MD  cefdinir (OMNICEF) 300 MG  capsule Take 1 capsule (300 mg total) by mouth 2 (two) times daily. 07/29/18   Salary, Avel Peace, MD  Fluticasone-Salmeterol (ADVAIR) 250-50 MCG/DOSE AEPB Inhale 1 puff into the lungs 2 (two) times daily. 07/29/18   Salary, Avel Peace, MD  furosemide (LASIX) 20 MG tablet Take 1 tablet (20 mg total) by mouth daily as needed for fluid or edema. 07/29/18   Salary, Avel Peace, MD  guaiFENesin (MUCINEX) 600 MG 12 hr tablet Take 1-2 tablets (600-1,200 mg total) by mouth 2 (two) times daily as needed for cough or to loosen phlegm. 07/29/18   Salary, Holly Bodily D, MD  metFORMIN (GLUCOPHAGE) 500 MG tablet Take 1 tablet (500 mg total) by mouth 2 (two) times daily with a meal. 07/29/18   Salary, Avel Peace, MD  metoprolol tartrate (LOPRESSOR) 50 MG tablet Take 1 tablet (50 mg total) by mouth 2 (two) times daily. 07/29/18   Salary, Avel Peace, MD  midodrine (PROAMATINE) 5 MG tablet Take 1 tablet (5 mg total) by mouth 3 (three) times daily with meals. 07/29/18   Salary, Holly Bodily D, MD  predniSONE (DELTASONE) 50 MG tablet One daily 07/29/18   Salary, Holly Bodily D, MD  tamsulosin (FLOMAX) 0.4 MG CAPS capsule Take 1 capsule (0.4 mg total) by mouth daily. 07/29/18   Salary, Avel Peace, MD    No Known Allergies  Family History  Problem Relation Age of Onset  . Heart disease Father   . Heart disease Mother   . Cancer Brother        lung    Social History Social History   Tobacco Use  . Smoking status: Current Every Day Smoker    Packs/day: 1.00    Years: 41.00    Pack years: 41.00    Types: Cigarettes  . Smokeless tobacco: Never Used  . Tobacco comment: "stopped smoking 04/21/1991 then restarted in 2012"  Substance Use Topics  . Alcohol use: No  . Drug use: No    Review of Systems  Constitutional: Negative for fever. Eyes: Negative for visual changes. ENT: Negative for sore throat. Cardiovascular: Negative for chest pain. Respiratory: Positive for shortness of breath when he walks around which is difficult  due to leg swelling and leg heaviness.   Gastrointestinal: Negative for abdominal pain, vomiting and diarrhea. Genitourinary: Negative for dysuria. Musculoskeletal: Negative for back pain. Skin: Negative for rash. Neurological: Negative for headache.  ____________________________________________   PHYSICAL EXAM:  VITAL SIGNS: ED Triage Vitals  Enc Vitals Group     BP 08/09/18 0839 125/73     Pulse Rate 08/09/18 0839 87     Resp 08/09/18 0839 16     Temp 08/09/18 0842 (!) 97.5 F (36.4 C)     Temp Source 08/09/18 0842 Oral     SpO2 08/09/18 0839 98 %     Weight 08/09/18 0840 167 lb 8.8 oz (76 kg)     Height 08/09/18 0840 5\' 10"  (1.778 m)     Head Circumference --      Peak  Flow --      Pain Score 08/09/18 0839 0     Pain Loc --      Pain Edu? --      Excl. in [place]? --      Constitutional: Alert and oriented.  HEENT      Head: Normocephalic and atraumatic.      Eyes: Conjunctivae are normal. Pupils equal and round.       Ears:         Nose: No congestion/rhinnorhea.      Mouth/Throat: Mucous membranes are moist.      Neck: No stridor. Cardiovascular/Chest: Normal rate, regular rhythm.  No murmurs, rubs, or gallops. Respiratory: Normal respiratory effort without tachypnea nor retractions. Breath sounds are clear and equal bilaterally.  Moderate wheezing no rales no rhonchi. Gastrointestinal: Soft. No distention, no guarding, no rebound. Nontender.    Genitourinary/rectal:Deferred Musculoskeletal: Nontender with normal range of motion in all extremities. No joint effusions.  No lower extremity tenderness.  3+ lower extremity pitting edema bilateral lower extremities Neurologic:  Normal speech and language. No gross or focal neurologic deficits are appreciated. Skin:  Skin is warm, dry and intact. No rash noted. Psychiatric: Mood and affect are normal. Speech and behavior are normal. Patient exhibits appropriate insight and  judgment.   ____________________________________________  LABS (pertinent positives/negatives) I, Lisa Roca, MD the attending physician have reviewed the labs noted below.  Labs Reviewed  CBC - Abnormal; Notable for the following components:      Result Value   WBC 11.3 (*)    RDW 15.8 (*)    All other components within normal limits  COMPREHENSIVE METABOLIC PANEL - Abnormal; Notable for the following components:   Glucose, Bld 144 (*)    AST 14 (*)    Total Bilirubin 1.7 (*)    All other components within normal limits  TROPONIN I  BRAIN NATRIURETIC PEPTIDE    ____________________________________________    EKG I, Lisa Roca, MD, the attending physician have personally viewed and interpreted all ECGs.  82 bpm.  Appears to be normal sinus rhythm although there is a wavy underlying baseline, QRS complexes are regular.  Normal axis.  Nonspecific T wave ____________________________________________  RADIOLOGY   Chest x-ray two-view:  IMPRESSION: Emphysema, asymmetric on the left. Crowding of markings at the bases. No consolidation or collapse identified. Low level infectious changes could be hidden within the chronic abnormal markings. __________________________________________  PROCEDURES  Procedure(s) performed: None  Procedures  Critical Care performed: None   ____________________________________________  ED COURSE / ASSESSMENT AND PLAN  Pertinent labs & imaging results that were available during my care of the patient were reviewed by me and considered in my medical decision making (see chart for details).    Although is reporting some shortness of breath, sound like his main complaint is lower extremity edema.  It is equal bilaterally.  Suspect most likely CHF exacerbation.  He was admitted recently discharged on 07/29/2018 for COPD exacerbation.  He does have mild wheezing I will go ahead and given DuoNeb treatment.  Chest x-ray without evidence  for edema or pneumonia.  Did attempt to get the patient up to walk him and he could not take but a few steps without becoming extremely dyspneic and wheezing and tripoding.  Patient was given additional DuoNeb as well as Solu-Medrol.  I am going to go ahead and discuss with hospitalist for admission for treatment of COPD exacerbation.  Not febrile.  Will leave decision regarding antibiotics to hospitalist.  No clear pneumonia on chest x-ray, was noted that low level infectious changes could be headed within the chronic abnormal markings.  He is not really having sputum production.  CONSULTATIONS: Hospice for admission.   Patient / Family / Caregiver informed of clinical course, medical decision-making process, and agree with plan.   ___________________________________________   FINAL CLINICAL IMPRESSION(S) / ED DIAGNOSES   Final diagnoses:  COPD exacerbation ([place])      ___________________________________________         Note: This dictation was prepared with Dragon dictation. Any transcriptional errors that result from this process are unintentional    Lisa Roca, MD 08/09/18 1330

## 2018-08-09 NOTE — Consult Note (Signed)
Pharmacy Antibiotic Note  [name] [name] is a 84 y.o. male admitted on 08/09/2018 with PNA/COPD exacerbation.  Patient was recently admitted ~2 weeks ago for COPD exacerbation. Pharmacy has been consulted for Levofloxacin dosing.  Plan: Ordered Levofloxacin 500 mg IV q24h.  Height: 5\' 10"  (177.8 cm) Weight: 167 lb 8.8 oz (76 kg) IBW/kg (Calculated) : 73  Temp (24hrs), Avg:97.5 F (36.4 C), Min:97.5 F (36.4 C), Max:97.5 F (36.4 C)  Recent Labs  Lab 08/09/18 0843  WBC 11.3*  CREATININE 0.84    Estimated Creatinine Clearance: 70 mL/min (by C-G formula based on SCr of 0.84 mg/dL).    No Known Allergies  Antimicrobials this admission: 11/21 Levofloxacin >>   Dose adjustments this admission: N/A  Microbiology results:   Thank you for allowing pharmacy to be a part of this patient's care.   Paticia Stack, PharmD Pharmacy Resident  08/09/2018 1:50 PM

## 2018-08-09 NOTE — Progress Notes (Signed)
Chaplain responded to an OR for an AD. Chaplain educated Pt on AD and Pt talked about the loss of his wife (10 years) and his daughter (in 68) he talked about the love of his grandchildren. Chaplain encouraged he talk about AD with his family and chaplain will visit tomorrow.    08/09/18 1400  Clinical Encounter Type  Visited With Patient  Visit Type Initial  Referral From Physician  Spiritual Encounters  Spiritual Needs Brochure;Prayer  .

## 2018-08-09 NOTE — Progress Notes (Signed)
Family Meeting Note  Advance Directive:no  Today a meeting took place with the Patient.  The following clinical team members were present during this meeting:MD  The following were discussed:Patient's diagnosis:copd exacerbation , Patient's progosis: Unable to determine and Goals for treatment: DNR  Additional follow-up to be provided: chaplain consult to create AD  Time spent during discussion:16 minutes  [name] [name], [name] Bold, MD

## 2018-08-09 NOTE — ED Notes (Addendum)
Pt unable to do walking o2 sat. Upon standing, pt c/o dizziness, blurred vision, and shortness of breath. Unsteady gait. Lowest O2 sat 90% on room air. Pt was able to sit down for a few minutes but still was unable to stand and walk without difficulty. MD Lord notified.

## 2018-08-10 DIAGNOSIS — J439 Emphysema, unspecified: Secondary | ICD-10-CM | POA: Diagnosis not present

## 2018-08-10 LAB — GLUCOSE, CAPILLARY
GLUCOSE-CAPILLARY: 174 mg/dL — AB (ref 70–99)
Glucose-Capillary: 251 mg/dL — ABNORMAL HIGH (ref 70–99)
Glucose-Capillary: 156 mg/dL — ABNORMAL HIGH (ref 70–99)
Glucose-Capillary: 176 mg/dL — ABNORMAL HIGH (ref 70–99)

## 2018-08-10 LAB — BASIC METABOLIC PANEL
Anion gap: 6 (ref 5–15)
BUN: 26 mg/dL — ABNORMAL HIGH (ref 8–23)
CALCIUM: 8.9 mg/dL (ref 8.9–10.3)
CHLORIDE: 106 mmol/L (ref 98–111)
CO2: 28 mmol/L (ref 22–32)
Creatinine, Ser: 0.84 mg/dL (ref 0.61–1.24)
GFR calc non Af Amer: >60 mL/min (ref >60)
GLUCOSE: 197 mg/dL — AB (ref 70–99)
Potassium: 4.3 mmol/L (ref 3.5–5.1)
Sodium: 140 mmol/L (ref 135–145)

## 2018-08-10 LAB — CBC
HCT: 40.1 % (ref 39.0–52.0)
Hemoglobin: 13 g/dL (ref 13.0–17.0)
MCH: 29.1 pg (ref 26.0–34.0)
MCHC: 32.4 g/dL (ref 30.0–36.0)
MCV: 89.9 fL (ref 80.0–100.0)
NRBC: 0 % (ref 0.0–0.2)
Platelets: 189 10*3/uL (ref 150–400)
RBC: 4.46 MIL/uL (ref 4.22–5.81)
RDW: 15.2 % (ref 11.5–15.5)
WBC: 8.5 10*3/uL (ref 4.0–10.5)

## 2018-08-10 MED ORDER — PREDNISONE 50 MG PO TABS: 50.0000 mg | ORAL_TABLET | Freq: Every day | ORAL | Status: DC

## 2018-08-10 MED ORDER — DOXYCYCLINE HYCLATE 100 MG PO TABS
100.0000 mg | ORAL_TABLET | Freq: Two times a day (BID) | ORAL | Status: DC
  Administered 2018-08-10 – 2018-08-11 (×2): 100 mg via ORAL
  Filled 2018-08-10 (×2): qty 1

## 2018-08-10 MED ORDER — PREDNISONE 50 MG PO TABS
50.0000 mg | ORAL_TABLET | Freq: Every day | ORAL | Status: DC
  Administered 2018-08-10 – 2018-08-11 (×2): 50 mg via ORAL
  Filled 2018-08-10 (×2): qty 1

## 2018-08-10 MED ORDER — INSULIN ASPART 100 UNIT/ML ~~LOC~~ SOLN
4.0000 [IU] | Freq: Three times a day (TID) | SUBCUTANEOUS | Status: DC
  Administered 2018-08-10 – 2018-08-11 (×2): 4 [IU] via SUBCUTANEOUS
  Filled 2018-08-10 (×2): qty 1

## 2018-08-10 MED ORDER — GUAIFENESIN 100 MG/5ML PO SOLN
5.0000 mL | ORAL | Status: DC | PRN
  Administered 2018-08-10: 100 mg via ORAL
  Filled 2018-08-10 (×2): qty 5

## 2018-08-10 NOTE — Progress Notes (Signed)
Inpatient Diabetes Program Recommendations  AACE/ADA: New Consensus Statement on Inpatient Glycemic Control (2015)  Target Ranges:  Prepandial:   less than 140 mg/dL      Peak postprandial:   less than 180 mg/dL (1-2 hours)      Critically ill patients:  140 - 180 mg/dL   Results for YUTAKA, HOLBERG (MRN [id number]) as of 08/10/2018 12:08  Ref. Range 08/09/2018 17:25 08/09/2018 21:50  Glucose-Capillary Latest Ref Range: 70 - 99 mg/dL 317 (H)  15 units NOVOLOG  245 (H)  2 units NOVOLOG    Results for KEMON, DEVINCENZI (MRN [id number]) as of 08/10/2018 12:08  Ref. Range 08/10/2018 07:40 08/10/2018 11:49  Glucose-Capillary Latest Ref Range: 70 - 99 mg/dL 174 (H)  4 units NOVOLOG  251 (H)    Home Meds: Metformin 500 mg BID  Current Orders: Novolog Resistant Correction Scale/ SSI (0-20 units) TID AC + HS      Getting Solumedrol 40 mg Q8H.  Having glucose elevations likely due to Solumedrol.  Eating 100% of meals.   MD- Please consider starting Novolog Meal Coverage for this patient while he is getting Solumedrol.  Novolog 4 units TID with meals  (Please add the following Hold Parameters: Hold if pt eats <50% of meal, Hold if pt NPO)     --Will follow patient during hospitalization--  Wyn Quaker RN, MSN, CDE Diabetes Coordinator Inpatient Glycemic Control Team Team Pager: [phone] (8a-5p)

## 2018-08-10 NOTE — Care Management (Signed)
CM asked patient if he would allow for members of the care team to assess his portable concentrator that he has in his car and he is very firm stating "No- I have a lot of valuables."  Informed him would have security and patient still declined.  CM had reached out to [facility] a third time regarding whether agency could have provide the equipment.  Informed a voicemail was left by Estill Bamberg.  CM contacted [facility] around 4p and informed agency was closed. CM discussed with patient of need to have a 120 volt converter to use when plugging it into his car. He raised his voice and said "Well why didn't they tell me."  Patient is not open to discuss any discharge options or plans

## 2018-08-10 NOTE — Progress Notes (Signed)
[name]    MR#:  [id number]  DATE OF BIRTH:  12/31/1933  SUBJECTIVE:  Came in with increasing shortness of breath REVIEW OF SYSTEMS:   Review of Systems  Constitutional: Negative for chills, fever and weight loss.  HENT: Negative for ear discharge, ear pain and nosebleeds.   Eyes: Negative for blurred vision, pain and discharge.  Respiratory: Positive for sputum production and shortness of breath. Negative for wheezing and stridor.   Cardiovascular: Negative for chest pain, palpitations, orthopnea and PND.  Gastrointestinal: Negative for abdominal pain, diarrhea, nausea and vomiting.  Genitourinary: Negative for frequency and urgency.  Musculoskeletal: Negative for back pain and joint pain.  Neurological: Negative for sensory change, speech change, focal weakness and weakness.  Psychiatric/Behavioral: Negative for depression and hallucinations. The patient is not nervous/anxious.    Tolerating Diet:yes Tolerating PT: ambulaotry  DRUG ALLERGIES:  No Known Allergies  VITALS:  Blood pressure 120/71, pulse 78, temperature 97.7 F (36.5 C), temperature source Oral, resp. rate 18, height 5\' 10"  (1.778 m), weight 75.8 kg, SpO2 100 %.  PHYSICAL EXAMINATION:   Physical Exam  GENERAL:  84 y.o.-year-old patient lying in the bed with no acute distress.  EYES: Pupils equal, round, reactive to light and accommodation. No scleral icterus. Extraocular muscles intact.  HEENT: Head atraumatic, normocephalic. Oropharynx and nasopharynx clear.  NECK:  Supple, no jugular venous distention. No thyroid enlargement, no tenderness.  LUNGS: Emphysematous chest distant breath sounds bilaterally, no wheezing, rales, rhonchi. No use of accessory muscles of respiration.  CARDIOVASCULAR: S1, S2 normal. No murmurs, rubs, or gallops.  ABDOMEN: Soft, nontender, nondistended. Bowel sounds present. No organomegaly or mass.   EXTREMITIES: No cyanosis, clubbing or edema b/l.    NEUROLOGIC: Cranial nerves II through XII are intact. No focal Motor or sensory deficits b/l.   PSYCHIATRIC:  patient is alert and oriented x 3.  SKIN: No obvious rash, lesion, or ulcer.   LABORATORY PANEL:  CBC Recent Labs  Lab 08/10/18 0508  WBC 8.5  HGB 13.0  HCT 40.1  PLT 189    Chemistries  Recent Labs  Lab 08/09/18 0843 08/10/18 0508  NA 144 140  K 3.8 4.3  CL 100 106  CO2 31 28  GLUCOSE 144* 197*  BUN 21 26*  CREATININE 0.84 0.84  CALCIUM 9.4 8.9  AST 14*  --   ALT 21  --   ALKPHOS 75  --   BILITOT 1.7*  --    Cardiac Enzymes Recent Labs  Lab 08/09/18 0843  TROPONINI <0.03   RADIOLOGY:  Dg Chest 2 View  Result Date: 08/09/2018 CLINICAL DATA:  Shortness of breath and weakness today. Productive cough. EXAM: CHEST - 2 VIEW COMPARISON:  07/25/2018 FINDINGS: Heart size is normal. Chronic aortic atherosclerosis. Chronic emphysema asymmetrically more pronounced in the left upper lung than the right. Chronic crowding of markings at the lung bases. No evidence of consolidation or lobar collapse. Low level pneumonia could be hidden within the chronic markings. No effusion. No acute bone finding. IMPRESSION: Emphysema, asymmetric on the left. Crowding of markings at the bases. No consolidation or collapse identified. Low level infectious changes could be hidden within the chronic abnormal markings. Electronically Signed   By: Nelson Chimes M.D.   On: 08/09/2018 09:15   ASSESSMENT AND PLAN:  [name] [name]  is a 84 y.o. male with a known history of COPD who wears oxygen at night and diabetes  who presents to the emergency room due to increasing shortness of breath and wheezing.  Patient was recently discharged from the hospital with COPD exacerbation.  He reports that when he went home he was feeling fine however over the past 2 days he has had increasing shortness of breath, wheezing and cough  1. Acute exacerbation of COPD   Start IV steroids 40 mg every 8 hourly--change to po prednisone Continue nebs and inhalers Oxygen at night and in the daytime if needed. Chest x-ray shows severe emphysema. No evidence of pneumonia on chest x-ray. -Empiric doxycycline trying to get generic so patient is able to afford.  2.  Diabetes: Sliding scale initiated  3.  Essential hypertension: Continue metoprolol  4.  BPH: Continue Flomax  5.  Hyperlipidemia: Continue statin  6.  Homelessness: Case management consultation requested -patient has some issues with his oxygen concentrator. He is not allowing care management to look into the concentrator which is in his car in the parking lot. Care management requested to get it with security officer however patient declined.  7. Tobacco dependence: Patient is encouraged to quit smoking. Counseling was provided for 4 minutes.`  Case discussed with Care Management/Social Worker. Management plans discussed with the patient, family and they are in agreement.  CODE STATUS: DNR DVT Prophylaxis: lovenox  TOTAL TIME TAKING CARE OF THIS PATIENT: 40 minutes.  >50% time spent on counselling and coordination of care  POSSIBLE D/C IN 1-2 DAYS, DEPENDING ON CLINICAL CONDITION.  Note: This dictation was prepared with Dragon dictation along with smaller phrase technology. Any transcriptional errors that result from this process are unintentional.  Fritzi Mandes M.D on 08/10/2018 at 3:11 PM  Between 7am to 6pm - Pager - [phone]  After 6pm go to [email or web address] - password [email or web address]  [facility]  Office  [phone]  CC: Primary care physician; Patient, No Pcp PerPatient ID: [name] [name], male   DOB: Feb 07, 1934, 85 y.o.   MRN: [id number]

## 2018-08-10 NOTE — Care Management (Signed)
Patient reported to CM in the ED that his concentrator is broken.  Patient says that when he plugs his concentrator into the car outlet (he does not have a lighter because it is a new car) the cord gets so hot it will blister his hand and "cuts off" in 30 minutes.  This does not happen when he is in a hotel room.  Contacted [facility] to report concern and will have to coordinate how to have unit checked out in patient's car which is now in the visitor parking lot.  Patient also says " I do not like this concentrator because it is a [employer] product and I do not like [employer]."

## 2018-08-10 NOTE — Progress Notes (Signed)
Chaplain responded to an OR. Chaplain review chart and made note of Pt previous conversation. Pt discussed things that were important to him and views about life. Chaplain practiced active listening and pastoral presence.    08/10/18 1100  Clinical Encounter Type  Visited With Patient  Visit Type Follow-up  Referral From Nurse  Spiritual Encounters  Spiritual Needs Prayer

## 2018-08-10 NOTE — Care Management (Signed)
Spoke with Caryl Pina from [facility]. Patient has not had oxygen with [facility] since August 2018.  His home concentrator and tanks were picked up when he moved. Agency has not provided patient with portable concentrator.

## 2018-08-10 NOTE — Consult Note (Signed)
Pharmacy Antibiotic Note  [name] [name] is a 84 y.o. male admitted on 08/09/2018 with PNA/COPD exacerbation.  Patient was recently admitted ~2 weeks ago for COPD exacerbation. Pharmacy has been consulted for Levofloxacin dosing.  Plan: Continue Levofloxacin 500 mg IV q24h.  Height: 5\' 10"  (177.8 cm) Weight: 167 lb 1.7 oz (75.8 kg) IBW/kg (Calculated) : 73  Temp (24hrs), Avg:97.8 F (36.6 C), Min:97.6 F (36.4 C), Max:97.9 F (36.6 C)  Recent Labs  Lab 08/09/18 0843 08/10/18 0508  WBC 11.3* 8.5  CREATININE 0.84 0.84    Estimated Creatinine Clearance: 70 mL/min (by C-G formula based on SCr of 0.84 mg/dL).    No Known Allergies  Antimicrobials this admission: 11/21 Levofloxacin >>   Dose adjustments this admission: N/A  Microbiology results: none  Thank you for allowing pharmacy to be a part of this patient's care.   Rayna Sexton, PharmD, BCPS Clinical Pharmacist 08/10/2018 10:12 AM

## 2018-08-11 DIAGNOSIS — J439 Emphysema, unspecified: Secondary | ICD-10-CM | POA: Diagnosis not present

## 2018-08-11 LAB — GLUCOSE, CAPILLARY: GLUCOSE-CAPILLARY: 126 mg/dL — AB (ref 70–99)

## 2018-08-11 MED ORDER — PREDNISONE 10 MG PO TABS: ORAL_TABLET | ORAL | 0 refills | Status: DC

## 2018-08-11 NOTE — Discharge Instructions (Signed)
Patient advised to follow up with his PCP in 7 to 10 days

## 2018-08-11 NOTE — Care Management (Signed)
Patient to discharge today. Patient lives in his car and will occasionally stay in hotel rooms depending on "how much money he has leftover on his check". He plugs his O2 concentrator into the car but unfortunately it only last 30 minutes before he has to unplug it and re plug it up to get O2. He has no issues with it when he is able to plug it into an outlet. Patient is very hesitant to allow anyone to his car including security staff from the hospital. Previous RNCM and SW have offered many resources and patient continues to decline. Patient current desire is to return to his car and use the oxygen the way his has. He will not meet anyone from [facility] but will drive to the facility in order to obtain new supplies that might be needed. I have given him the contact numbers I have for [facility] and strongly encourage he do this as soon as possible. Patient in agreement for current disposition. Ines Bloomer RN BSN RNCM [phone]

## 2018-08-11 NOTE — Progress Notes (Signed)
Patient discharging to his car. Instructions given to patient, verbalized understanding. Patient transported to his car via wheelchair and staff member.

## 2018-08-11 NOTE — Clinical Social Work Note (Signed)
CSW received consult for homelessness. This patient is being followed by [facility] as he has declined CSW contact. The patient continues to choose to live in his car and refuses resources. CSW is signing off. Please consult should patient request CSW involvement.  Santiago Bumpers, MSW, Latanya Presser [phone]

## 2018-08-11 NOTE — Discharge Summary (Signed)
[facility] at [place] NAME: [name] [name]    MR#:  [id number]  DATE OF BIRTH:  Feb 13, 1934  DATE OF ADMISSION:  08/09/2018 ADMITTING PHYSICIAN: Bettey Costa, MD  DATE OF DISCHARGE: 08/11/2018  PRIMARY CARE PHYSICIAN: Patient, No Pcp Per    ADMISSION DIAGNOSIS:  COPD exacerbation ([place]) [J44.1]  DISCHARGE DIAGNOSIS:  acute on chronic COPD exacerbation Chronic home oxygen use.  SECONDARY DIAGNOSIS:   Past Medical History:  Diagnosis Date  . ABSCESS 12/03/2009  . ABSCESS, FINGER 04/07/2010  . ANXIETY 11/03/2009  . ASTHMA 11/03/2009  . CHRONIC OBSTRUCTIVE PULMONARY DISEASE, ACUTE EXACERBATION 11/03/2009  . Colon cancer ([place])   . COPD 11/03/2009  . DEPRESSION 11/03/2009  . DIABETES MELLITUS, TYPE II 11/03/2009  . [place] DISEASE, LUMBAR 11/03/2009  . EMPHYSEMA, BULLOUS 11/03/2009  . GERD 11/03/2009  . HYPERLIPIDEMIA 11/03/2009  . HYPERTENSION 11/03/2009  . Kidney stones 01/30/12   "I've had them 7 times; always have passed them"  . PEPTIC ULCER DISEASE 11/03/2009  . Pneumonia   . RASH-NONVESICULAR 11/03/2009  . RESTLESS LEG SYNDROME 11/03/2009  . Shortness of breath    "sometimes; at any time"  . SPINAL STENOSIS, LUMBAR 11/03/2009    HOSPITAL COURSE:   FredGroganis a84 y.o.malewith a known history of COPD who wears oxygen at night and diabetes who presents to the emergency room due to increasing shortness of breath and wheezing. Patient was recently discharged from the hospital with COPD exacerbation. He reports that when he went home he was feeling fine however over the past 2 days he has had increasing shortness of breath, wheezing and cough  1.Acute exacerbation of COPD  Start IV steroids 40 mg every 8 hourly--change to po prednisone Continue nebs and inhalers Oxygen at night and in the daytime if needed. Chest x-ray shows severe emphysema. No evidence of pneumonia on chest x-ray. No indication for abxs  2. Diabetes: Sliding  scale initiated Resume metformin  3. Essential hypertension: Continue metoprolol  4. BPH: Continue Flomax  5. Hyperlipidemia: Continue statin  6. Homelessness: Case management consultation requested--see their rnote -Declined CSW to help him  7.Tobacco dependence: Patient is encouraged to quit smoking. Counseling was provided for 4 minutes.`  Overall at baseline. D/c home CONSULTS OBTAINED:    DRUG ALLERGIES:  No Known Allergies  DISCHARGE MEDICATIONS:   Allergies as of 08/11/2018   No Known Allergies     Medication List    STOP taking these medications   cefdinir 300 MG capsule Commonly known as:  OMNICEF   guaiFENesin 600 MG 12 hr tablet Commonly known as:  MUCINEX   midodrine 5 MG tablet Commonly known as:  PROAMATINE     TAKE these medications   albuterol 108 (90 Base) MCG/ACT inhaler Commonly known as:  PROVENTIL HFA;VENTOLIN HFA Inhale 2 puffs into the lungs every 6 (six) hours as needed.   aspirin 81 MG chewable tablet Chew 1 tablet (81 mg total) by mouth daily.   atorvastatin 40 MG tablet Commonly known as:  LIPITOR Take 1 tablet (40 mg total) by mouth daily.   Fluticasone-Salmeterol 250-50 MCG/DOSE Aepb Commonly known as:  ADVAIR Inhale 1 puff into the lungs 2 (two) times daily.   furosemide 20 MG tablet Commonly known as:  LASIX Take 1 tablet (20 mg total) by mouth daily as needed for fluid or edema.   metFORMIN 500 MG tablet Commonly known as:  GLUCOPHAGE Take 1 tablet (500 mg total) by mouth 2 (two) times  daily with a meal.   metoprolol tartrate 50 MG tablet Commonly known as:  LOPRESSOR Take 1 tablet (50 mg total) by mouth 2 (two) times daily.   predniSONE 10 MG tablet Commonly known as:  DELTASONE Take 50 mg daily taper by 10 mg daily then s top Start taking on:  08/12/2018 What changed:    medication strength  additional instructions   tamsulosin 0.4 MG Caps capsule Commonly known as:  FLOMAX Take 1 capsule (0.4  mg total) by mouth daily.       If you experience worsening of your admission symptoms, develop shortness of breath, life threatening emergency, suicidal or homicidal thoughts you must seek medical attention immediately by calling 911 or calling your MD immediately  if symptoms less severe.  You Must read complete instructions/literature along with all the possible adverse reactions/side effects for all the Medicines you take and that have been prescribed to you. Take any new Medicines after you have completely understood and accept all the possible adverse reactions/side effects.   Please note  You were cared for by a hospitalist during your hospital stay. If you have any questions about your discharge medications or the care you received while you were in the hospital after you are discharged, you can call the unit and asked to speak with the hospitalist on call if the hospitalist that took care of you is not available. Once you are discharged, your primary care physician will handle any further medical issues. Please note that NO REFILLS for any discharge medications will be authorized once you are discharged, as it is imperative that you return to your primary care physician (or establish a relationship with a primary care physician if you do not have one) for your aftercare needs so that they can reassess your need for medications and monitor your lab values. Today   SUBJECTIVE   Doing well. No new complaints  VITAL SIGNS:  Blood pressure (!) 133/94, pulse 72, temperature (!) 97.4 F (36.3 C), temperature source Oral, resp. rate 18, height 5\' 10"  (1.778 m), weight 78.1 kg, SpO2 98 %.  I/O:    Intake/Output Summary (Last 24 hours) at 08/11/2018 0904 Last data filed at 08/10/2018 1901 Gross per 24 hour  Intake 1440.22 ml  Output -  Net 1440.22 ml    PHYSICAL EXAMINATION:  GENERAL:  84 y.o.-year-old patient lying in the bed with no acute distress.  EYES: Pupils equal, round,  reactive to light and accommodation. No scleral icterus. Extraocular muscles intact.  HEENT: Head atraumatic, normocephalic. Oropharynx and nasopharynx clear.  NECK:  Supple, no jugular venous distention. No thyroid enlargement, no tenderness.  LUNGS: Normal breath sounds bilaterally, no wheezing, rales,rhonchi or crepitation. No use of accessory muscles of respiration.  CARDIOVASCULAR: S1, S2 normal. No murmurs, rubs, or gallops.  ABDOMEN: Soft, non-tender, non-distended. Bowel sounds present. No organomegaly or mass.  EXTREMITIES: No pedal edema, cyanosis, or clubbing.  NEUROLOGIC: Cranial nerves II through XII are intact. Muscle strength 5/5 in all extremities. Sensation intact. Gait not checked.  PSYCHIATRIC: The patient is alert and oriented x 3.  SKIN: No obvious rash, lesion, or ulcer.   DATA REVIEW:   CBC  Recent Labs  Lab 08/10/18 0508  WBC 8.5  HGB 13.0  HCT 40.1  PLT 189    Chemistries  Recent Labs  Lab 08/09/18 0843 08/10/18 0508  NA 144 140  K 3.8 4.3  CL 100 106  CO2 31 28  GLUCOSE 144* 197*  BUN 21  26*  CREATININE 0.84 0.84  CALCIUM 9.4 8.9  AST 14*  --   ALT 21  --   ALKPHOS 75  --   BILITOT 1.7*  --     Microbiology Results   No results found for this or any previous visit (from the past 240 hour(s)).  RADIOLOGY:  Dg Chest 2 View  Result Date: 08/09/2018 CLINICAL DATA:  Shortness of breath and weakness today. Productive cough. EXAM: CHEST - 2 VIEW COMPARISON:  07/25/2018 FINDINGS: Heart size is normal. Chronic aortic atherosclerosis. Chronic emphysema asymmetrically more pronounced in the left upper lung than the right. Chronic crowding of markings at the lung bases. No evidence of consolidation or lobar collapse. Low level pneumonia could be hidden within the chronic markings. No effusion. No acute bone finding. IMPRESSION: Emphysema, asymmetric on the left. Crowding of markings at the bases. No consolidation or collapse identified. Low level  infectious changes could be hidden within the chronic abnormal markings. Electronically Signed   By: Nelson Chimes M.D.   On: 08/09/2018 09:15     Management plans discussed with the patient, family and they are in agreement.  CODE STATUS:     Code Status Orders  (From admission, onward)         Start     Ordered   08/09/18 1644  Do not attempt resuscitation (DNR)  Continuous    Question Answer Comment  In the event of cardiac or respiratory ARREST Do not call a "code blue"   In the event of cardiac or respiratory ARREST Do not perform Intubation, CPR, defibrillation or ACLS   In the event of cardiac or respiratory ARREST Use medication by any route, position, wound care, and other measures to relive pain and suffering. May use oxygen, suction and manual treatment of airway obstruction as needed for comfort.   Comments nurse may pronounce      08/09/18 1643        Code Status History    Date Active Date Inactive Code Status Order ID Comments User Context   08/09/2018 1643 08/09/2018 1643 Full Code [id number]  Bettey Costa, MD Inpatient   07/26/2018 0117 07/29/2018 1705 DNR [id number]  Amelia Jo, MD ED   06/17/2018 2235 06/20/2018 2038 DNR [id number]  Sela Hua, MD Inpatient   05/20/2018 1232 05/24/2018 1803 DNR [id number]  Loletha Grayer, MD ED   03/24/2017 0552 03/26/2017 1600 DNR [id number]  Norval Morton, MD ED   02/21/2017 0908 02/21/2017 1510 DNR [id number]  Dustin Flock, MD Inpatient   01/15/2017 2243 01/17/2017 1512 Full Code [id number]  Saundra Shelling, MD ED   12/15/2016 2054 12/16/2016 2121 Full Code [id number]  Dustin Flock, MD Inpatient   12/15/2016 1840 12/15/2016 2054 DNR [id number]  Dustin Flock, MD ED   February 12, 202018 2122 11/02/2016 1740 DNR [id number]  Loletha Grayer, MD ED   03/17/2012 0542 03/19/2012 2014 Full Code [id number]  Marylou Mccoy, RN Inpatient   01/30/2012 1656 02/03/2012 1859 Full Code [id number]  Murlean Iba, MD Inpatient   08/11/2011 1803 08/13/2011  1835 Full Code [id number]  Loyal Gambler, RN Inpatient      TOTAL TIME TAKING CARE OF THIS PATIENT: *40* minutes.    Fritzi Mandes M.D on 08/11/2018 at 9:04 AM  Between 7am to 6pm - Pager - [phone] After 6pm go to [email or web address] - password [email or web address] [facility]  Office  [phone]  CC: Primary care physician; Patient, No Pcp Per

## 2018-08-25 ENCOUNTER — Emergency Department
Admission: EM | Admit: 2018-08-25 | Discharge: 2018-08-26 | Disposition: A | Discharge status: Other Acute Inpatient Hospital | Payer: Medicare PPO | Attending: Emergency Medicine | Admitting: Emergency Medicine

## 2018-08-25 ENCOUNTER — Emergency Department: Payer: Medicare PPO

## 2018-08-25 ENCOUNTER — Encounter: Payer: Self-pay | Admitting: *Deleted

## 2018-08-25 ENCOUNTER — Other Ambulatory Visit: Payer: Self-pay

## 2018-08-25 DIAGNOSIS — J45909 Unspecified asthma, uncomplicated: Secondary | ICD-10-CM | POA: Diagnosis not present

## 2018-08-25 DIAGNOSIS — F17210 Nicotine dependence, cigarettes, uncomplicated: Secondary | ICD-10-CM | POA: Diagnosis not present

## 2018-08-25 DIAGNOSIS — Z7982 Long term (current) use of aspirin: Secondary | ICD-10-CM | POA: Diagnosis not present

## 2018-08-25 DIAGNOSIS — Z79899 Other long term (current) drug therapy: Secondary | ICD-10-CM | POA: Insufficient documentation

## 2018-08-25 DIAGNOSIS — Z85038 Personal history of other malignant neoplasm of large intestine: Secondary | ICD-10-CM | POA: Insufficient documentation

## 2018-08-25 DIAGNOSIS — A419 Sepsis, unspecified organism: Secondary | ICD-10-CM | POA: Diagnosis not present

## 2018-08-25 DIAGNOSIS — E119 Type 2 diabetes mellitus without complications: Secondary | ICD-10-CM | POA: Insufficient documentation

## 2018-08-25 DIAGNOSIS — J449 Chronic obstructive pulmonary disease, unspecified: Secondary | ICD-10-CM | POA: Diagnosis not present

## 2018-08-25 DIAGNOSIS — R2231 Localized swelling, mass and lump, right upper limb: Secondary | ICD-10-CM | POA: Diagnosis present

## 2018-08-25 DIAGNOSIS — R6521 Severe sepsis with septic shock: Secondary | ICD-10-CM | POA: Diagnosis not present

## 2018-08-25 LAB — COMPREHENSIVE METABOLIC PANEL
ALT: 16 U/L (ref 0–44)
AST: 17 U/L (ref 15–41)
Albumin: 3.4 g/dL — ABNORMAL LOW (ref 3.5–5.0)
Alkaline Phosphatase: 72 U/L (ref 38–126)
Anion gap: 10 (ref 5–15)
BILIRUBIN TOTAL: 1.2 mg/dL (ref 0.3–1.2)
BUN: 20 mg/dL (ref 8–23)
CALCIUM: 9 mg/dL (ref 8.9–10.3)
CO2: 29 mmol/L (ref 22–32)
CREATININE: 1.06 mg/dL (ref 0.61–1.24)
Chloride: 102 mmol/L (ref 98–111)
GFR calc Af Amer: >60 mL/min (ref >60)
GFR calc non Af Amer: >60 mL/min (ref >60)
Glucose, Bld: 135 mg/dL — ABNORMAL HIGH (ref 70–99)
Potassium: 3.4 mmol/L — ABNORMAL LOW (ref 3.5–5.1)
Sodium: 141 mmol/L (ref 135–145)
Total Protein: 6.7 g/dL (ref 6.5–8.1)

## 2018-08-25 LAB — CBC WITH DIFFERENTIAL/PLATELET
Abs Immature Granulocytes: 0.03 10*3/uL (ref 0.00–0.07)
BASOS PCT: 0 %
Basophils Absolute: 0 10*3/uL (ref 0.0–0.1)
EOS ABS: 0 10*3/uL (ref 0.0–0.5)
Eosinophils Relative: 0 %
HCT: 42.8 % (ref 39.0–52.0)
Hemoglobin: 13.7 g/dL (ref 13.0–17.0)
Immature Granulocytes: 0 %
Lymphocytes Relative: 3 %
Lymphs Abs: 0.2 10*3/uL — ABNORMAL LOW (ref 0.7–4.0)
MCH: 29.5 pg (ref 26.0–34.0)
MCHC: 32 g/dL (ref 30.0–36.0)
MCV: 92.2 fL (ref 80.0–100.0)
Monocytes Absolute: 0.2 10*3/uL (ref 0.1–1.0)
Monocytes Relative: 3 %
Neutro Abs: 8.2 10*3/uL — ABNORMAL HIGH (ref 1.7–7.7)
Neutrophils Relative %: 94 %
Platelets: 233 10*3/uL (ref 150–400)
RBC: 4.64 MIL/uL (ref 4.22–5.81)
RDW: 16.7 % — ABNORMAL HIGH (ref 11.5–15.5)
WBC: 8.7 10*3/uL (ref 4.0–10.5)
nRBC: 0 % (ref 0.0–0.2)

## 2018-08-25 LAB — LACTIC ACID, PLASMA: Lactic Acid, Venous: 1.8 mmol/L (ref 0.5–1.9)

## 2018-08-25 LAB — GLUCOSE, CAPILLARY: Glucose-Capillary: 109 mg/dL — ABNORMAL HIGH (ref 70–99)

## 2018-08-25 LAB — PROTIME-INR
INR: 0.96
Prothrombin Time: 12.7 seconds (ref 11.4–15.2)

## 2018-08-25 MED ORDER — ACETAMINOPHEN 10 MG/ML IV SOLN: 1000.0000 mg | Freq: Once | INTRAVENOUS | Status: DC

## 2018-08-25 MED ORDER — NOREPINEPHRINE 4 MG/250ML-% IV SOLN
0.0000 ug/min | INTRAVENOUS | Status: DC
  Administered 2018-08-25: 10 ug/min via INTRAVENOUS
  Filled 2018-08-25: qty 250

## 2018-08-25 MED ORDER — FENTANYL CITRATE (PF) 100 MCG/2ML IJ SOLN
INTRAMUSCULAR | Status: AC
  Administered 2018-08-25: 100 ug via INTRAVENOUS
  Filled 2018-08-25: qty 2

## 2018-08-25 MED ORDER — PROPOFOL 1000 MG/100ML IV EMUL
5.0000 ug/kg/min | INTRAVENOUS | Status: DC
  Administered 2018-08-25: 20 ug/kg/min via INTRAVENOUS
  Filled 2018-08-25: qty 100

## 2018-08-25 MED ORDER — SODIUM CHLORIDE 0.9 % IV BOLUS
1000.0000 mL | Freq: Once | INTRAVENOUS | Status: AC
  Administered 2018-08-25: 1000 mL via INTRAVENOUS

## 2018-08-25 MED ORDER — PIPERACILLIN-TAZOBACTAM 3.375 G IVPB 30 MIN
3.3750 g | Freq: Once | INTRAVENOUS | Status: AC
  Administered 2018-08-25: 3.375 g via INTRAVENOUS
  Filled 2018-08-25: qty 50

## 2018-08-25 MED ORDER — CLINDAMYCIN PHOSPHATE 900 MG/50ML IV SOLN
900.0000 mg | Freq: Once | INTRAVENOUS | Status: AC
  Administered 2018-08-25: 900 mg via INTRAVENOUS

## 2018-08-25 MED ORDER — FENTANYL 2500MCG IN NS 250ML (10MCG/ML) PREMIX INFUSION
0.0000 ug/h | INTRAVENOUS | Status: DC
  Administered 2018-08-25: 100 ug/h via INTRAVENOUS
  Filled 2018-08-25: qty 250

## 2018-08-25 MED ORDER — ACETAMINOPHEN 10 MG/ML IV SOLN
1000.0000 mg | Freq: Once | INTRAVENOUS | Status: AC
  Administered 2018-08-25: 1000 mg via INTRAVENOUS
  Filled 2018-08-25: qty 100

## 2018-08-25 MED ORDER — FENTANYL CITRATE (PF) 100 MCG/2ML IJ SOLN
100.0000 ug | Freq: Once | INTRAMUSCULAR | Status: AC
  Administered 2018-08-25: 100 ug via INTRAVENOUS

## 2018-08-25 MED ORDER — CLINDAMYCIN PHOSPHATE 900 MG/50ML IV SOLN
INTRAVENOUS | Status: AC
  Administered 2018-08-25: 900 mg via INTRAVENOUS
  Filled 2018-08-25: qty 50

## 2018-08-25 MED ORDER — VANCOMYCIN HCL IN DEXTROSE 1-5 GM/200ML-% IV SOLN
1000.0000 mg | Freq: Two times a day (BID) | INTRAVENOUS | Status: DC
  Administered 2018-08-26: 1000 mg via INTRAVENOUS
  Filled 2018-08-25: qty 200

## 2018-08-25 MED ORDER — SODIUM CHLORIDE 0.9 % IV BOLUS: 2000.0000 mL | Freq: Once | INTRAVENOUS | Status: DC

## 2018-08-25 NOTE — ED Notes (Signed)
Pt reporting the redness and swelling started in his right arm 2 days ago. No injury and no bite reported. No fevers.

## 2018-08-25 NOTE — ED Provider Notes (Signed)
[facility] Emergency Department Provider Note  ____________________________________________   First MD Initiated Contact with Patient 08/25/18 2253     (approximate)  I have reviewed the triage vital signs and the nursing notes.   HISTORY  Chief Complaint Hand Injury   HPI [name] [name] is a 84 y.o. male comes to the emergency department via EMS with roughly 24 hours of progressive pain and swelling to his right hand.  He denies any particular trauma but does note he has had shaking chills that began this evening.  He is noted that his right hand has become progressively more swollen and painful.  His symptoms are currently severe.  Nothing in particular makes them better or worse.  He has a past medical history of diabetes mellitus as well as COPD, hypertension, dyslipidemia, and depression.  He is right-hand dominant.    Past Medical History:  Diagnosis Date  . ABSCESS 12/03/2009  . ABSCESS, FINGER 04/07/2010  . ANXIETY 11/03/2009  . ASTHMA 11/03/2009  . CHRONIC OBSTRUCTIVE PULMONARY DISEASE, ACUTE EXACERBATION 11/03/2009  . Colon cancer ([place])   . COPD 11/03/2009  . DEPRESSION 11/03/2009  . DIABETES MELLITUS, TYPE II 11/03/2009  . [place] DISEASE, LUMBAR 11/03/2009  . EMPHYSEMA, BULLOUS 11/03/2009  . GERD 11/03/2009  . HYPERLIPIDEMIA 11/03/2009  . HYPERTENSION 11/03/2009  . Kidney stones 01/30/12   "I've had them 7 times; always have passed them"  . PEPTIC ULCER DISEASE 11/03/2009  . Pneumonia   . RASH-NONVESICULAR 11/03/2009  . RESTLESS LEG SYNDROME 11/03/2009  . Shortness of breath    "sometimes; at any time"  . SPINAL STENOSIS, LUMBAR 11/03/2009    Patient Active Problem List   Diagnosis Date Noted  . COPD with acute exacerbation ([place]) 07/26/2018  . Congestive heart failure ([place])   . Palliative care by specialist   . DNR (do not resuscitate)   . COPD exacerbation ([place]) 03/24/2017  . Hypoxia 03/24/2017  . Diabetes mellitus type 2 in nonobese ([place])  03/24/2017  . Polysubstance abuse ([place]) 03/24/2017  . Pressure injury of skin 03/24/2017  . Overdose of benzodiazepine 01/16/2017  . Unresponsiveness   . HCAP (healthcare-associated pneumonia)   . Respiratory failure ([place]) 01/15/2017  . CVA (cerebral vascular accident) ([place]) 12/15/2016  . Chest pain 10/30/202018  . PNA (pneumonia) 03/17/2012  . Weakness generalized 03/17/2012  . Generalized weakness 01/30/2012  . Fall at home 01/30/2012  . Physical deconditioning 01/30/2012  . Nausea vomiting and diarrhea 01/15/2012  . UTI (urinary tract infection) 01/15/2012  . Cocaine abuse ([place]) 08/11/2011  . Tobacco abuse 08/11/2011  . Orthostasis 12/23/2010  . Dehydration 12/23/2010  . Abdominal pain, other specified site 12/23/2010  . Dizziness 12/23/2010  . Weight loss 12/23/2010  . Left lumbar radiculopathy 12/23/2010  . DIABETES MELLITUS, TYPE II 11/03/2009  . HYPERLIPIDEMIA 11/03/2009  . ANXIETY 11/03/2009  . DEPRESSION 11/03/2009  . RESTLESS LEG SYNDROME 11/03/2009  . HYPERTENSION 11/03/2009  . EMPHYSEMA, BULLOUS 11/03/2009  . ASTHMA 11/03/2009  . COPD 11/03/2009  . GERD 11/03/2009  . PEPTIC ULCER DISEASE 11/03/2009  . [place] DISEASE, LUMBAR 11/03/2009  . SPINAL STENOSIS, LUMBAR 11/03/2009  . NEPHROLITHIASIS, HX OF 11/03/2009    Past Surgical History:  Procedure Laterality Date  . Colon cancer surgery    . [place]   left  . INGUINAL HERNIA REPAIR  10/2011   left  . ROTATOR CUFF REPAIR  2003   left  . TONSILLECTOMY  1960    Prior to  Admission medications   Medication Sig Start Date End Date Taking? Authorizing Provider  albuterol (PROVENTIL HFA;VENTOLIN HFA) 108 (90 Base) MCG/ACT inhaler Inhale 2 puffs into the lungs every 6 (six) hours as needed. 07/29/18   Salary, Avel Peace, MD  aspirin 81 MG chewable tablet Chew 1 tablet (81 mg total) by mouth daily. 07/29/18   Salary, Avel Peace, MD  atorvastatin (LIPITOR) 40 MG tablet Take 1 tablet (40 mg total) by  mouth daily. 07/29/18   Salary, Avel Peace, MD  Fluticasone-Salmeterol (ADVAIR) 250-50 MCG/DOSE AEPB Inhale 1 puff into the lungs 2 (two) times daily. 07/29/18   Salary, Avel Peace, MD  furosemide (LASIX) 20 MG tablet Take 1 tablet (20 mg total) by mouth daily as needed for fluid or edema. 07/29/18   Salary, Holly Bodily D, MD  metFORMIN (GLUCOPHAGE) 500 MG tablet Take 1 tablet (500 mg total) by mouth 2 (two) times daily with a meal. 07/29/18   Salary, Avel Peace, MD  metoprolol tartrate (LOPRESSOR) 50 MG tablet Take 1 tablet (50 mg total) by mouth 2 (two) times daily. 07/29/18   Salary, Avel Peace, MD  predniSONE (DELTASONE) 10 MG tablet Take 50 mg daily taper by 10 mg daily then s top 08/12/18   Fritzi Mandes, MD  tamsulosin (FLOMAX) 0.4 MG CAPS capsule Take 1 capsule (0.4 mg total) by mouth daily. 07/29/18   Salary, Avel Peace, MD    Allergies Patient has no known allergies.  Family History  Problem Relation Age of Onset  . Heart disease Father   . Heart disease Mother   . Cancer Brother        lung    Social History Social History   Tobacco Use  . Smoking status: Current Every Day Smoker    Packs/day: 1.00    Years: 41.00    Pack years: 41.00    Types: Cigarettes  . Smokeless tobacco: Never Used  . Tobacco comment: "stopped smoking 04/21/1991 then restarted in 2012"  Substance Use Topics  . Alcohol use: No  . Drug use: No    Review of Systems Constitutional: Positive for fevers and chills Eyes: No visual changes. ENT: No sore throat. Cardiovascular: Denies chest pain. Respiratory: Denies shortness of breath. Gastrointestinal: No abdominal pain.  No nausea, no vomiting.  No diarrhea.  No constipation. Genitourinary: Negative for dysuria. Musculoskeletal: Positive for hand pain Skin: Positive for rash Neurological: Negative for headaches, focal weakness or numbness.   ____________________________________________   PHYSICAL EXAM:  VITAL SIGNS: ED Triage Vitals  Enc Vitals  Group     BP --      Pulse Rate 08/25/18 2235 98     Resp 08/25/18 2235 (!) 32     Temp 08/25/18 2235 98.7 F (37.1 C)     Temp Source 08/25/18 2235 Oral     SpO2 --      Weight 08/25/18 2239 172 lb 1.6 oz (78.1 kg)     Height 08/25/18 2236 5' 10"  (1.778 m)     Head Circumference --      Peak Flow --      Pain Score 08/25/18 2236 10     Pain Loc --      Pain Edu? --      Excl. in [place]? --     Constitutional:appears critically ill with elevated respiratory rate, is uncomfortable appearing.  Somewhat obtunded and encephalopathic Eyes: PERRL EOMI. Head: Atraumatic. Nose: No congestion/rhinnorhea. Mouth/Throat: No trismus Neck: No stridor.   Cardiovascular: Tachycardic rate, regular rhythm.  Grossly normal heart sounds.  Poor peripheral circulation with sluggish capillary refill around 4 to 5 seconds in all extremities Respiratory: Increased respiratory effort.  No retractions. Lungs CTAB and moving good air Gastrointestinal: Soft nontender Musculoskeletal: Right hand very swollen with reddish and purple discoloration.  He is neuro intact although has delayed capillary refill Neurologic:   No gross focal neurologic deficits are appreciated. Skin: See photo below of right hand.  The dorsal aspect of his hand is quite swollen although not indurated or fluctuant.  The swelling and discoloration extend up to forearm Psychiatric: Very anxious appearing.  Encephalopathic        ____________________________________________   DIFFERENTIAL includes but not limited to  Sepsis, septic shock, hand abscess, necrotizing fasciitis ____________________________________________   LABS (all labs ordered are listed, but only abnormal results are displayed)  Labs Reviewed  COMPREHENSIVE METABOLIC PANEL - Abnormal; Notable for the following components:      Result Value   Potassium 3.4 (*)    Glucose, Bld 135 (*)    Albumin 3.4 (*)    All other components within normal limits  CBC WITH  DIFFERENTIAL/PLATELET - Abnormal; Notable for the following components:   RDW 16.7 (*)    Neutro Abs 8.2 (*)    Lymphs Abs 0.2 (*)    All other components within normal limits  URINALYSIS, COMPLETE (UACMP) WITH MICROSCOPIC - Abnormal; Notable for the following components:   Color, Urine AMBER (*)    APPearance CLEAR (*)    Ketones, ur 5 (*)    All other components within normal limits  GLUCOSE, CAPILLARY - Abnormal; Notable for the following components:   Glucose-Capillary 109 (*)    All other components within normal limits  BLOOD GAS, ARTERIAL - Abnormal; Notable for the following components:   Acid-base deficit 4.5 (*)    All other components within normal limits  CULTURE, BLOOD (ROUTINE X 2)  CULTURE, BLOOD (ROUTINE X 2)  PROTIME-INR  LACTIC ACID, PLASMA  PROCALCITONIN  INFLUENZA PANEL BY PCR (TYPE A & B)    Lab work reviewed by me with slight ketosis otherwise largely unremarkable __________________________________________  EKG  ED ECG REPORT I, Darel Hong, the attending physician, personally viewed and interpreted this ECG.  Date: 08/26/2018 EKG Time:  Rate: 99 Rhythm: normal sinus rhythm QRS Axis: normal Intervals: First-degree AV block ST/T Wave abnormalities: normal Narrative Interpretation: no evidence of acute ischemia  ____________________________________________  RADIOLOGY  First chest x-ray reviewed by me shows chronic changes but no acute disease Second chest x-ray reviewed by me shows endotracheal tube is slightly high.  Orogastric tube in good position Right forearm x-ray reviewed by me with no acute disease Right hand x-ray reviewed by me shows soft tissue swelling but no obvious gas ____________________________________________   PROCEDURES  Procedure(s) performed: Yes  .Critical Care Performed by: Darel Hong, MD Authorized by: Darel Hong, MD   Critical care provider statement:    Critical care time (minutes):  65    Critical care time was exclusive of:  Separately billable procedures and treating other patients   Critical care was necessary to treat or prevent imminent or life-threatening deterioration of the following conditions:  Sepsis and shock   Critical care was time spent personally by me on the following activities:  Development of treatment plan with patient or surrogate, discussions with consultants, evaluation of patient's response to treatment, examination of patient, obtaining history from patient or surrogate, ordering and performing treatments and interventions, ordering and review of laboratory  studies, ordering and review of radiographic studies, pulse oximetry, re-evaluation of patient's condition and review of old charts .Central Line Date/Time: 08/25/2018 11:51 PM Performed by: Darel Hong, MD Authorized by: Darel Hong, MD   Consent:    Consent obtained:  Emergent situation and verbal   Consent given by:  Patient   Risks discussed:  Arterial puncture, incorrect placement, infection and bleeding   Alternatives discussed:  Alternative treatment Pre-procedure details:    Hand hygiene: Hand hygiene performed prior to insertion     Sterile barrier technique: All elements of maximal sterile technique followed     Skin preparation:  2% chlorhexidine   Skin preparation agent: Skin preparation agent completely dried prior to procedure   Anesthesia (see MAR for exact dosages):    Anesthesia method:  Local infiltration   Local anesthetic:  Lidocaine 1% w/o epi Procedure details:    Location:  R femoral   Patient position:  Reverse Trendelenburg   Procedural supplies:  Triple lumen   Landmarks identified: yes     Ultrasound guidance: yes     Sterile ultrasound techniques: Sterile gel and sterile probe covers were used     Number of attempts:  2   Successful placement: yes   Post-procedure details:    Post-procedure:  Dressing applied and line sutured   Assessment:  Blood return  through all ports and free fluid flow   Patient tolerance of procedure:  Tolerated well, no immediate complications Comments:     I initially attempted the procedure after marking the site with [place], but got an arterial stick so converted to direct US guidance.  I did hold pressure for 5 minutes in between. Procedure Name: Intubation Date/Time: 08/25/2018 11:51 PM Performed by: Darel Hong, MD Pre-anesthesia Checklist: Patient identified, Patient being monitored, Emergency Drugs available, Timeout performed and Suction available Oxygen Delivery Method: Nasal cannula Preoxygenation: Pre-oxygenation with 100% oxygen Induction Type: Rapid sequence Ventilation: Mask ventilation without difficulty Laryngoscope Size: Mac and 4 Tube size: 7.5 mm Number of attempts: 1 Placement Confirmation: ETT inserted through vocal cords under direct vision,  CO2 detector and Breath sounds checked- equal and bilateral Secured at: 23 cm Tube secured with: ETT holder    OG placement Date/Time: 08/26/2018 9:08 AM Performed by: Darel Hong, MD Authorized by: Darel Hong, MD  Consent: The procedure was performed in an emergent situation. Patient tolerance: Patient tolerated the procedure well with no immediate complications     Critical Care performed: Yes  ____________________________________________   INITIAL IMPRESSION / ASSESSMENT AND PLAN / ED COURSE  Pertinent labs & imaging results that were available during my care of the patient were reviewed by me and considered in my medical decision making (see chart for details).   As part of my medical decision making, I reviewed the following data within the [place] History obtained from family if available, nursing notes, old chart and ekg, as well as notes from prior ED visits.  The patient arrives to the emergency department critically ill-appearing.  His first temperature was oral and unremarkable however during my exam  he felt extremely warm so I checked a rectal temperature which was 103.9 degrees.  He has relatively acute onset of painful swelling in his right hand and given his history of diabetes mellitus I have a high clinical suspicion for necrotizing fasciitis.  I have begun him on clindamycin Zosyn and vancomycin with clindamycin going first to help neutralize any preformed toxins.  I do not appreciate any obvious  crepitus however I will obtain x-rays in addition to broad labs and cultures.  We have no ICU beds available swell call out to [facility] regarding possible transfer.  Prior to transfer the patient's respiratory status worsened.  He requires aggressive fluid resuscitation and is in a significant amount of pain.  We discussed intubation and he begged me to "put me to sleep".  I do not think he will be able to tolerate his increased respiratory rate for much longer so he was intubated with fentanyl, ketamine, and succinylcholine.  I then spoke with [facility] and discussed the case with her general surgeon who requested ER to ER transfer given the unclear diagnosis of necrotizing fasciitis.  Unclear if the patient would be best served on a medical or surgical service.  I then discussed with [facility] ER physician Dr. Aletha Halim who is graciously agreed to accept the patient as an ER to ER transfer.  As the patient was septic with altered mental status      ____________________________________________   FINAL CLINICAL IMPRESSION(S) / ED DIAGNOSES  Final diagnoses:  Septic shock ([place])      NEW MEDICATIONS STARTED DURING THIS VISIT:  Discharge Medication List as of 08/26/2018  2:09 AM       Note:  This document was prepared using Dragon voice recognition software and may include unintentional dictation errors.     Darel Hong, MD 08/26/18 [phone]

## 2018-08-25 NOTE — ED Triage Notes (Signed)
Pt to ED via EMS from a rest stop with right arm swelling and redness reom the shoulder to the hand. Pt was recently admitted for SOB. Pt chronically on 2L oxygen but was placed on 4L by EMS.

## 2018-08-25 NOTE — Progress Notes (Signed)
Pharmacy Antibiotic Note  [name] [name] is a 84 y.o. male admitted on 08/25/2018 with cellulitis.  Pharmacy has been consulted for vancomycin dosing.  Plan: Vancomycin 1g IV every 12 hours.  Goal trough 15-20 mcg/mL.  Will draw trough 12/09 @ 1000 prior to 4th dose.  Height: 5\' 10"  (177.8 cm) Weight: 172 lb 1.6 oz (78.1 kg) IBW/kg (Calculated) : 73  Temp (24hrs), Avg:98.7 F (37.1 C), Min:98.7 F (37.1 C), Max:98.7 F (37.1 C)  Recent Labs  Lab 08/25/18 2245  WBC 8.7    Estimated Creatinine Clearance: 70 mL/min (by C-G formula based on SCr of 0.84 mg/dL).    No Known Allergies  Thank you for allowing pharmacy to be a part of this patient's care.  Tobie Lords, PharmD, BCPS Clinical Pharmacist 08/25/2018

## 2018-08-26 ENCOUNTER — Emergency Department: Payer: Medicare PPO

## 2018-08-26 ENCOUNTER — Ambulatory Visit (HOSPITAL_COMMUNITY)
Admission: AD | Admit: 2018-08-26 | Discharge: 2018-08-26 | Disposition: A | Discharge status: Home / Self Care | Payer: Medicare PPO | Source: Other Acute Inpatient Hospital | Attending: Emergency Medicine | Admitting: Emergency Medicine

## 2018-08-26 DIAGNOSIS — R6521 Severe sepsis with septic shock: Secondary | ICD-10-CM | POA: Insufficient documentation

## 2018-08-26 LAB — PROCALCITONIN: PROCALCITONIN: 4.58 ng/mL

## 2018-08-26 LAB — URINALYSIS, COMPLETE (UACMP) WITH MICROSCOPIC
Bacteria, UA: NONE SEEN
Bilirubin Urine: NEGATIVE
Glucose, UA: NEGATIVE mg/dL
Hgb urine dipstick: NEGATIVE
Ketones, ur: 5 mg/dL — AB
Leukocytes, UA: NEGATIVE
Nitrite: NEGATIVE
Protein, ur: NEGATIVE mg/dL
Specific Gravity, Urine: 1.027 (ref 1.005–1.030)
pH: 5 (ref 5.0–8.0)

## 2018-08-26 LAB — BLOOD GAS, ARTERIAL
ACID-BASE DEFICIT: 4.5 mmol/L — AB (ref 0.0–2.0)
BICARBONATE: 20.3 mmol/L (ref 20.0–28.0)
FIO2: 30
MECHVT: 500 mL
Mechanical Rate: 20
O2 Saturation: 97.8 %
Patient temperature: 37
pCO2 arterial: 36 mmHg (ref 32.0–48.0)
pH, Arterial: 7.36 (ref 7.350–7.450)
pO2, Arterial: 104 mmHg (ref 83.0–108.0)

## 2018-08-26 LAB — INFLUENZA PANEL BY PCR (TYPE A & B)
Influenza A By PCR: NEGATIVE
Influenza B By PCR: NEGATIVE

## 2018-08-26 MED ORDER — PROPOFOL 100 MG/10ML IV EMUL
20.00 | INTRAVENOUS | Status: DC
Start: ? — End: 2018-08-26

## 2018-08-26 MED ORDER — MORPHINE SULFATE 4 MG/ML IJ SOLN
2.00 | INTRAMUSCULAR | Status: DC
Start: ? — End: 2018-08-26

## 2018-08-26 MED ORDER — FENTANYL CITRATE (PF) 50 MCG/ML IJ SOLN
50.00 | INTRAMUSCULAR | Status: DC
Start: ? — End: 2018-08-26

## 2018-08-26 MED ORDER — CHLORHEXIDINE GLUCONATE 0.12 % MT SOLN
5.00 | OROMUCOSAL | Status: DC
Start: 2018-08-27 — End: 2018-08-26

## 2018-08-26 MED ORDER — FENTANYL CITRATE 2500 MCG/50ML IV SOLN
0.00 | INTRAVENOUS | Status: DC
Start: ? — End: 2018-08-26

## 2018-08-26 MED ORDER — TAMSULOSIN HCL 0.4 MG PO CAPS
0.40 | ORAL_CAPSULE | ORAL | Status: DC
Start: 2018-09-04 — End: 2018-08-26

## 2018-08-26 MED ORDER — KETAMINE HCL 10 MG/ML IJ SOLN
INTRAMUSCULAR | Status: AC | PRN
  Administered 2018-08-25: 150 mg via INTRAVENOUS

## 2018-08-26 MED ORDER — DOCUSATE SODIUM 150 MG/15ML PO LIQD
100.00 | ORAL | Status: DC
Start: 2018-08-28 — End: 2018-08-26

## 2018-08-26 MED ORDER — FLUTICASONE-SALMETEROL 250-50 MCG/DOSE IN AEPB
1.00 | INHALATION_SPRAY | RESPIRATORY_TRACT | Status: DC
Start: 2018-09-05 — End: 2018-08-26

## 2018-08-26 MED ORDER — IPRATROPIUM BROMIDE 0.02 % IN SOLN
500.00 | RESPIRATORY_TRACT | Status: DC
Start: 2018-08-26 — End: 2018-08-26

## 2018-08-26 MED ORDER — FENTANYL CITRATE (PF) 100 MCG/2ML IJ SOLN
INTRAMUSCULAR | Status: AC | PRN
  Administered 2018-08-25: 150 ug via INTRAVENOUS

## 2018-08-26 MED ORDER — PROPOFOL 100 MG/10ML IV EMUL
0.00 | INTRAVENOUS | Status: DC
Start: 2018-08-26 — End: 2018-08-26

## 2018-08-26 MED ORDER — GENERIC EXTERNAL MEDICATION
0.04 | Status: DC
Start: ? — End: 2018-08-26

## 2018-08-26 MED ORDER — FAMOTIDINE 20 MG PO TABS
20.00 | ORAL_TABLET | ORAL | Status: DC
Start: 2018-08-27 — End: 2018-08-26

## 2018-08-26 MED ORDER — DEXTROSE 10 % IV SOLN
12.50 | INTRAVENOUS | Status: DC
Start: ? — End: 2018-08-26

## 2018-08-26 MED ORDER — INSULIN REGULAR HUMAN 100 UNIT/ML IJ SOLN
1.00 | INTRAMUSCULAR | Status: DC
Start: 2018-08-27 — End: 2018-08-26

## 2018-08-26 MED ORDER — IPRATROPIUM BROMIDE 0.02 % IN SOLN
500.00 | RESPIRATORY_TRACT | Status: DC
Start: 2018-08-27 — End: 2018-08-26

## 2018-08-26 MED ORDER — ACETAMINOPHEN 325 MG PO TABS
650.00 | ORAL_TABLET | ORAL | Status: DC
Start: ? — End: 2018-08-26

## 2018-08-26 MED ORDER — ATORVASTATIN CALCIUM 40 MG PO TABS
40.00 | ORAL_TABLET | ORAL | Status: DC
Start: 2018-09-04 — End: 2018-08-26

## 2018-08-26 MED ORDER — SODIUM CHLORIDE 0.9 % IV BOLUS
1000.0000 mL | Freq: Once | INTRAVENOUS | Status: AC
  Administered 2018-08-26: 1000 mL via INTRAVENOUS

## 2018-08-26 MED ORDER — GENERIC EXTERNAL MEDICATION
0.00 | Status: DC
Start: ? — End: 2018-08-26

## 2018-08-26 MED ORDER — CEFEPIME HCL 2 GM/100ML IV SOLN
2.00 | INTRAVENOUS | Status: DC
Start: 2018-08-28 — End: 2018-08-26

## 2018-08-26 MED ORDER — PROPOFOL 100 MG/10ML IV EMUL
0.00 | INTRAVENOUS | Status: DC
Start: ? — End: 2018-08-26

## 2018-08-26 MED ORDER — ALBUTEROL SULFATE (2.5 MG/3ML) 0.083% IN NEBU
2.50 | INHALATION_SOLUTION | RESPIRATORY_TRACT | Status: DC
Start: 2018-08-27 — End: 2018-08-26

## 2018-08-26 MED ORDER — SUCCINYLCHOLINE CHLORIDE 20 MG/ML IJ SOLN
INTRAMUSCULAR | Status: AC | PRN
  Administered 2018-08-25: 150 mg via INTRAVENOUS

## 2018-08-26 MED ORDER — ASPIRIN 81 MG PO CHEW
81.00 | CHEWABLE_TABLET | ORAL | Status: DC
Start: 2018-09-05 — End: 2018-08-26

## 2018-08-26 MED ORDER — VANCOMYCIN HCL IN DEXTROSE 1-5 GM/200ML-% IV SOLN
1000.00 | INTRAVENOUS | Status: DC
Start: 2018-08-27 — End: 2018-08-26

## 2018-08-26 MED ORDER — PNEUMOCOCCAL VAC POLYVALENT 25 MCG/0.5ML IJ INJ
0.50 | INJECTION | INTRAMUSCULAR | Status: DC
Start: ? — End: 2018-08-26

## 2018-08-26 MED ORDER — ENOXAPARIN SODIUM 40 MG/0.4ML ~~LOC~~ SOLN
40.00 | SUBCUTANEOUS | Status: DC
Start: 2018-09-05 — End: 2018-08-26

## 2018-08-26 NOTE — Progress Notes (Signed)
Et tube advanced to 26 at the lip. Initially at 24 at the lip. Verbal order received from Dr Mable Paris.

## 2018-08-26 NOTE — ED Notes (Signed)
Report to Phillips Hay and Corene Cornea, Agricultural consultant at [facility]

## 2018-08-26 NOTE — ED Notes (Signed)
Ewing, Fandino [phone]  [phone]    Called to let know about father's disposition

## 2018-08-26 NOTE — ED Notes (Signed)
Report to Nicola Police

## 2018-08-27 MED ORDER — ALBUTEROL SULFATE (2.5 MG/3ML) 0.083% IN NEBU
2.50 | INHALATION_SOLUTION | RESPIRATORY_TRACT | Status: DC
Start: ? — End: 2018-08-27

## 2018-08-27 MED ORDER — OXYCODONE HCL 5 MG PO TABS
5.00 | ORAL_TABLET | ORAL | Status: DC
Start: ? — End: 2018-08-27

## 2018-08-27 MED ORDER — INSULIN REGULAR HUMAN 100 UNIT/ML IJ SOLN
1.00 | INTRAMUSCULAR | Status: DC
Start: 2018-09-04 — End: 2018-08-27

## 2018-08-27 MED ORDER — GUAIFENESIN 100 MG/5ML PO SYRP
200.00 | ORAL_SOLUTION | ORAL | Status: DC
Start: ? — End: 2018-08-27

## 2018-08-28 MED ORDER — GENERIC EXTERNAL MEDICATION
1250.00 | Status: DC
Start: 2018-08-28 — End: 2018-08-28

## 2018-08-28 MED ORDER — GABAPENTIN 100 MG PO CAPS
100.00 | ORAL_CAPSULE | ORAL | Status: DC
Start: 2018-08-28 — End: 2018-08-28

## 2018-08-29 MED ORDER — FUROSEMIDE 20 MG PO TABS
20.00 | ORAL_TABLET | ORAL | Status: DC
Start: 2018-09-05 — End: 2018-08-29

## 2018-08-29 MED ORDER — ACETAMINOPHEN 325 MG PO TABS
650.00 | ORAL_TABLET | ORAL | Status: DC
Start: ? — End: 2018-08-29

## 2018-08-29 MED ORDER — GENERIC EXTERNAL MEDICATION
1250.00 | Status: DC
Start: 2018-09-02 — End: 2018-08-29

## 2018-08-29 MED ORDER — ACETAMINOPHEN 500 MG PO TABS
1000.00 | ORAL_TABLET | ORAL | Status: DC
Start: 2018-08-29 — End: 2018-08-29

## 2018-08-29 MED ORDER — GENERIC EXTERNAL MEDICATION
2.00 | Status: DC
Start: 2018-08-30 — End: 2018-08-29

## 2018-08-29 MED ORDER — GENERIC EXTERNAL MEDICATION
12.50 | Status: DC
Start: ? — End: 2018-08-29

## 2018-08-29 MED ORDER — GABAPENTIN 300 MG PO CAPS
300.00 | ORAL_CAPSULE | ORAL | Status: DC
Start: 2018-09-04 — End: 2018-08-29

## 2018-08-29 MED ORDER — DOCUSATE SODIUM 100 MG PO CAPS
100.00 | ORAL_CAPSULE | ORAL | Status: DC
Start: 2018-09-04 — End: 2018-08-29

## 2018-08-30 ENCOUNTER — Ambulatory Visit: Payer: Medicare (Managed Care) | Admitting: Internal Medicine

## 2018-08-30 DIAGNOSIS — Z0289 Encounter for other administrative examinations: Secondary | ICD-10-CM

## 2018-08-30 LAB — CULTURE, BLOOD (ROUTINE X 2): Culture: NO GROWTH

## 2018-09-02 MED ORDER — IPRATROPIUM BROMIDE 0.02 % IN SOLN
500.00 | RESPIRATORY_TRACT | Status: DC
Start: 2018-09-05 — End: 2018-09-02

## 2018-09-02 MED ORDER — DICLOFENAC SODIUM 1 % TD GEL
2.00 | TRANSDERMAL | Status: DC
Start: 2018-09-04 — End: 2018-09-02

## 2018-09-02 MED ORDER — METOPROLOL TARTRATE 25 MG PO TABS
25.00 | ORAL_TABLET | ORAL | Status: DC
Start: 2018-09-04 — End: 2018-09-02

## 2018-09-02 MED ORDER — CLINDAMYCIN PHOSPHATE IN D5W 600 MG/50ML IV SOLN
600.00 | INTRAVENOUS | Status: DC
Start: 2018-09-02 — End: 2018-09-02

## 2018-09-02 MED ORDER — ACETAMINOPHEN 325 MG PO TABS
650.00 | ORAL_TABLET | ORAL | Status: DC
Start: ? — End: 2018-09-02

## 2018-09-02 MED ORDER — ALBUTEROL SULFATE (2.5 MG/3ML) 0.083% IN NEBU
2.50 | INHALATION_SOLUTION | RESPIRATORY_TRACT | Status: DC
Start: 2018-09-05 — End: 2018-09-02

## 2018-09-04 MED ORDER — CLINDAMYCIN HCL 150 MG PO CAPS
450.00 | ORAL_CAPSULE | ORAL | Status: DC
Start: 2018-09-04 — End: 2018-09-04

## 2018-09-04 MED ORDER — DOXYCYCLINE HYCLATE 100 MG PO TABS
100.00 | ORAL_TABLET | ORAL | Status: DC
Start: 2018-09-04 — End: 2018-09-04

## 2018-09-10 ENCOUNTER — Inpatient Hospital Stay: Payer: Self-pay | Admitting: Internal Medicine

## 2018-09-10 ENCOUNTER — Telehealth: Payer: Self-pay | Admitting: Internal Medicine

## 2018-09-10 NOTE — Telephone Encounter (Signed)
FYI- Pt did not show up for his HFU appt today 09/11/19. I called pt to resch appt no answer I left a vm with my contact number to return call. Thank you!

## 2018-09-10 NOTE — Telephone Encounter (Signed)
Ok. Thank you.

## 2018-09-10 NOTE — Telephone Encounter (Signed)
This is and ED follow up no TCM required.

## 2018-09-25 ENCOUNTER — Emergency Department (HOSPITAL_COMMUNITY): Payer: Medicare PPO

## 2018-09-25 ENCOUNTER — Other Ambulatory Visit: Payer: Self-pay

## 2018-09-25 ENCOUNTER — Encounter (HOSPITAL_COMMUNITY): Payer: Self-pay

## 2018-09-25 ENCOUNTER — Other Ambulatory Visit: Payer: Self-pay | Admitting: Internal Medicine

## 2018-09-25 ENCOUNTER — Inpatient Hospital Stay (HOSPITAL_COMMUNITY)
Admission: EM | Admit: 2018-09-25 | Discharge: 2018-10-04 | DRG: 167 | Disposition: A | Discharge status: Home Health | Payer: Medicare PPO | Attending: Internal Medicine | Admitting: Internal Medicine

## 2018-09-25 ENCOUNTER — Observation Stay (HOSPITAL_COMMUNITY): Payer: Medicare PPO

## 2018-09-25 DIAGNOSIS — Z8249 Family history of ischemic heart disease and other diseases of the circulatory system: Secondary | ICD-10-CM

## 2018-09-25 DIAGNOSIS — Z7984 Long term (current) use of oral hypoglycemic drugs: Secondary | ICD-10-CM

## 2018-09-25 DIAGNOSIS — R918 Other nonspecific abnormal finding of lung field: Secondary | ICD-10-CM | POA: Diagnosis not present

## 2018-09-25 DIAGNOSIS — J9611 Chronic respiratory failure with hypoxia: Secondary | ICD-10-CM | POA: Diagnosis not present

## 2018-09-25 DIAGNOSIS — E119 Type 2 diabetes mellitus without complications: Secondary | ICD-10-CM

## 2018-09-25 DIAGNOSIS — C3492 Malignant neoplasm of unspecified part of left bronchus or lung: Secondary | ICD-10-CM

## 2018-09-25 DIAGNOSIS — J910 Malignant pleural effusion: Secondary | ICD-10-CM | POA: Diagnosis present

## 2018-09-25 DIAGNOSIS — J449 Chronic obstructive pulmonary disease, unspecified: Secondary | ICD-10-CM

## 2018-09-25 DIAGNOSIS — Z66 Do not resuscitate: Secondary | ICD-10-CM | POA: Diagnosis present

## 2018-09-25 DIAGNOSIS — R0789 Other chest pain: Secondary | ICD-10-CM

## 2018-09-25 DIAGNOSIS — J441 Chronic obstructive pulmonary disease with (acute) exacerbation: Secondary | ICD-10-CM

## 2018-09-25 DIAGNOSIS — J439 Emphysema, unspecified: Secondary | ICD-10-CM | POA: Diagnosis present

## 2018-09-25 DIAGNOSIS — Z9889 Other specified postprocedural states: Secondary | ICD-10-CM

## 2018-09-25 DIAGNOSIS — Z87891 Personal history of nicotine dependence: Secondary | ICD-10-CM

## 2018-09-25 DIAGNOSIS — G893 Neoplasm related pain (acute) (chronic): Secondary | ICD-10-CM | POA: Diagnosis present

## 2018-09-25 DIAGNOSIS — K219 Gastro-esophageal reflux disease without esophagitis: Secondary | ICD-10-CM | POA: Diagnosis present

## 2018-09-25 DIAGNOSIS — Z85038 Personal history of other malignant neoplasm of large intestine: Secondary | ICD-10-CM

## 2018-09-25 DIAGNOSIS — I10 Essential (primary) hypertension: Secondary | ICD-10-CM | POA: Diagnosis present

## 2018-09-25 DIAGNOSIS — J9621 Acute and chronic respiratory failure with hypoxia: Secondary | ICD-10-CM | POA: Diagnosis not present

## 2018-09-25 DIAGNOSIS — Z590 Homelessness: Secondary | ICD-10-CM

## 2018-09-25 DIAGNOSIS — R0602 Shortness of breath: Secondary | ICD-10-CM | POA: Diagnosis not present

## 2018-09-25 DIAGNOSIS — Z9981 Dependence on supplemental oxygen: Secondary | ICD-10-CM

## 2018-09-25 DIAGNOSIS — J90 Pleural effusion, not elsewhere classified: Secondary | ICD-10-CM

## 2018-09-25 DIAGNOSIS — D649 Anemia, unspecified: Secondary | ICD-10-CM

## 2018-09-25 DIAGNOSIS — N400 Enlarged prostate without lower urinary tract symptoms: Secondary | ICD-10-CM | POA: Diagnosis present

## 2018-09-25 DIAGNOSIS — M940 Chondrocostal junction syndrome [Tietze]: Secondary | ICD-10-CM | POA: Diagnosis present

## 2018-09-25 DIAGNOSIS — G2581 Restless legs syndrome: Secondary | ICD-10-CM | POA: Diagnosis present

## 2018-09-25 DIAGNOSIS — J9610 Chronic respiratory failure, unspecified whether with hypoxia or hypercapnia: Secondary | ICD-10-CM | POA: Diagnosis present

## 2018-09-25 DIAGNOSIS — F1910 Other psychoactive substance abuse, uncomplicated: Secondary | ICD-10-CM | POA: Diagnosis present

## 2018-09-25 DIAGNOSIS — R0600 Dyspnea, unspecified: Secondary | ICD-10-CM

## 2018-09-25 DIAGNOSIS — M48061 Spinal stenosis, lumbar region without neurogenic claudication: Secondary | ICD-10-CM | POA: Diagnosis present

## 2018-09-25 DIAGNOSIS — J9690 Respiratory failure, unspecified, unspecified whether with hypoxia or hypercapnia: Secondary | ICD-10-CM

## 2018-09-25 DIAGNOSIS — Z7982 Long term (current) use of aspirin: Secondary | ICD-10-CM

## 2018-09-25 DIAGNOSIS — C3410 Malignant neoplasm of upper lobe, unspecified bronchus or lung: Secondary | ICD-10-CM | POA: Diagnosis present

## 2018-09-25 DIAGNOSIS — E785 Hyperlipidemia, unspecified: Secondary | ICD-10-CM | POA: Diagnosis present

## 2018-09-25 DIAGNOSIS — Z8673 Personal history of transient ischemic attack (TIA), and cerebral infarction without residual deficits: Secondary | ICD-10-CM

## 2018-09-25 HISTORY — DX: Unspecified asthma, uncomplicated: J45.909

## 2018-09-25 HISTORY — PX: IR THORACENTESIS ASP PLEURAL SPACE W/IMG GUIDE: IMG5380

## 2018-09-25 HISTORY — DX: Dependence on supplemental oxygen: Z99.81

## 2018-09-25 LAB — LACTATE DEHYDROGENASE, PLEURAL OR PERITONEAL FLUID: LD, Fluid: 243 U/L — ABNORMAL HIGH (ref 3–23)

## 2018-09-25 LAB — GLUCOSE, PLEURAL OR PERITONEAL FLUID: Glucose, Fluid: 108 mg/dL

## 2018-09-25 LAB — COMPREHENSIVE METABOLIC PANEL
ALT: 15 U/L (ref 0–44)
AST: 16 U/L (ref 15–41)
Albumin: 3.3 g/dL — ABNORMAL LOW (ref 3.5–5.0)
Alkaline Phosphatase: 67 U/L (ref 38–126)
Anion gap: 10 (ref 5–15)
BUN: 23 mg/dL (ref 8–23)
CO2: 26 mmol/L (ref 22–32)
Calcium: 9.3 mg/dL (ref 8.9–10.3)
Chloride: 103 mmol/L (ref 98–111)
Creatinine, Ser: 1.11 mg/dL (ref 0.61–1.24)
GFR calc Af Amer: >60 mL/min (ref >60)
GFR calc non Af Amer: >60 mL/min (ref >60)
Glucose, Bld: 97 mg/dL (ref 70–99)
POTASSIUM: 3.7 mmol/L (ref 3.5–5.1)
Sodium: 139 mmol/L (ref 135–145)
Total Bilirubin: 0.7 mg/dL (ref 0.3–1.2)
Total Protein: 7 g/dL (ref 6.5–8.1)

## 2018-09-25 LAB — CBC WITH DIFFERENTIAL/PLATELET
Abs Immature Granulocytes: 0.09 10*3/uL — ABNORMAL HIGH (ref 0.00–0.07)
Basophils Absolute: 0.1 10*3/uL (ref 0.0–0.1)
Basophils Relative: 1 %
Eosinophils Absolute: 0.5 10*3/uL (ref 0.0–0.5)
Eosinophils Relative: 4 %
HCT: 35.7 % — ABNORMAL LOW (ref 39.0–52.0)
Hemoglobin: 11.1 g/dL — ABNORMAL LOW (ref 13.0–17.0)
Immature Granulocytes: 1 %
Lymphocytes Relative: 15 %
Lymphs Abs: 1.8 10*3/uL (ref 0.7–4.0)
MCH: 28.8 pg (ref 26.0–34.0)
MCHC: 31.1 g/dL (ref 30.0–36.0)
MCV: 92.7 fL (ref 80.0–100.0)
Monocytes Absolute: 0.7 10*3/uL (ref 0.1–1.0)
Monocytes Relative: 6 %
Neutro Abs: 8.8 10*3/uL — ABNORMAL HIGH (ref 1.7–7.7)
Neutrophils Relative %: 73 %
Platelets: 304 10*3/uL (ref 150–400)
RBC: 3.85 MIL/uL — ABNORMAL LOW (ref 4.22–5.81)
RDW: 15.3 % (ref 11.5–15.5)
WBC: 11.9 10*3/uL — ABNORMAL HIGH (ref 4.0–10.5)
nRBC: 0 % (ref 0.0–0.2)

## 2018-09-25 LAB — BODY FLUID CELL COUNT WITH DIFFERENTIAL
Eos, Fluid: 12 %
Lymphs, Fluid: 53 %
MONOCYTE-MACROPHAGE-SEROUS FLUID: 25 % — AB (ref 50–90)
Neutrophil Count, Fluid: 10 % (ref 0–25)
Other Cells, Fluid: 13 %
WBC FLUID: 5637 uL — AB (ref 0–1000)

## 2018-09-25 LAB — RAPID URINE DRUG SCREEN, HOSP PERFORMED
Amphetamines: NOT DETECTED
Barbiturates: NOT DETECTED
Benzodiazepines: NOT DETECTED
Cocaine: NOT DETECTED
Opiates: NOT DETECTED
Tetrahydrocannabinol: NOT DETECTED

## 2018-09-25 LAB — GRAM STAIN

## 2018-09-25 LAB — D-DIMER, QUANTITATIVE: D-Dimer, Quant: 7.26 ug/mL-FEU — ABNORMAL HIGH (ref 0.00–0.50)

## 2018-09-25 LAB — I-STAT TROPONIN, ED: TROPONIN I, POC: 0.01 ng/mL (ref 0.00–0.08)

## 2018-09-25 LAB — BRAIN NATRIURETIC PEPTIDE: B Natriuretic Peptide: 30.5 pg/mL (ref 0.0–100.0)

## 2018-09-25 LAB — PROTEIN, PLEURAL OR PERITONEAL FLUID: TOTAL PROTEIN, FLUID: 4.8 g/dL

## 2018-09-25 MED ORDER — ENOXAPARIN SODIUM 40 MG/0.4ML ~~LOC~~ SOLN
40.0000 mg | SUBCUTANEOUS | Status: DC
  Administered 2018-09-25 – 2018-09-26 (×2): 40 mg via SUBCUTANEOUS
  Filled 2018-09-25 (×3): qty 0.4

## 2018-09-25 MED ORDER — METOPROLOL TARTRATE 50 MG PO TABS
50.0000 mg | ORAL_TABLET | Freq: Two times a day (BID) | ORAL | Status: DC
  Administered 2018-09-25 – 2018-10-04 (×19): 50 mg via ORAL
  Filled 2018-09-25: qty 2
  Filled 2018-09-25 (×18): qty 1

## 2018-09-25 MED ORDER — ATORVASTATIN CALCIUM 40 MG PO TABS
40.0000 mg | ORAL_TABLET | Freq: Every day | ORAL | Status: DC
  Administered 2018-09-25 – 2018-10-04 (×9): 40 mg via ORAL
  Filled 2018-09-25 (×9): qty 1

## 2018-09-25 MED ORDER — OXYCODONE HCL 5 MG PO TABS
5.0000 mg | ORAL_TABLET | ORAL | Status: AC
  Administered 2018-09-25: 5 mg via ORAL
  Filled 2018-09-25: qty 1

## 2018-09-25 MED ORDER — TAMSULOSIN HCL 0.4 MG PO CAPS
0.4000 mg | ORAL_CAPSULE | Freq: Every day | ORAL | Status: DC
  Administered 2018-09-25 – 2018-10-04 (×9): 0.4 mg via ORAL
  Filled 2018-09-25 (×9): qty 1

## 2018-09-25 MED ORDER — IPRATROPIUM-ALBUTEROL 0.5-2.5 (3) MG/3ML IN SOLN
3.0000 mL | Freq: Three times a day (TID) | RESPIRATORY_TRACT | Status: DC
  Administered 2018-09-26 – 2018-10-04 (×23): 3 mL via RESPIRATORY_TRACT
  Filled 2018-09-25 (×23): qty 3

## 2018-09-25 MED ORDER — PREDNISONE 20 MG PO TABS
60.0000 mg | ORAL_TABLET | Freq: Once | ORAL | Status: AC
  Administered 2018-09-25: 60 mg via ORAL
  Filled 2018-09-25: qty 3

## 2018-09-25 MED ORDER — MORPHINE SULFATE (PF) 2 MG/ML IV SOLN
2.0000 mg | INTRAVENOUS | Status: DC | PRN
  Administered 2018-09-26 – 2018-10-03 (×31): 2 mg via INTRAVENOUS
  Filled 2018-09-25 (×32): qty 1

## 2018-09-25 MED ORDER — OXYCODONE HCL 5 MG PO TABS
5.0000 mg | ORAL_TABLET | ORAL | Status: DC | PRN
  Administered 2018-09-25 (×2): 5 mg via ORAL
  Filled 2018-09-25 (×2): qty 1

## 2018-09-25 MED ORDER — IPRATROPIUM-ALBUTEROL 0.5-2.5 (3) MG/3ML IN SOLN
3.0000 mL | Freq: Four times a day (QID) | RESPIRATORY_TRACT | Status: DC
  Administered 2018-09-25 (×2): 3 mL via RESPIRATORY_TRACT
  Filled 2018-09-25 (×2): qty 3

## 2018-09-25 MED ORDER — ONDANSETRON HCL 4 MG PO TABS: 4.0000 mg | ORAL_TABLET | Freq: Four times a day (QID) | ORAL | Status: DC | PRN

## 2018-09-25 MED ORDER — MOMETASONE FURO-FORMOTEROL FUM 200-5 MCG/ACT IN AERO
2.0000 | INHALATION_SPRAY | Freq: Two times a day (BID) | RESPIRATORY_TRACT | Status: DC
  Administered 2018-09-25 – 2018-10-04 (×16): 2 via RESPIRATORY_TRACT
  Filled 2018-09-25: qty 8.8

## 2018-09-25 MED ORDER — ASPIRIN 81 MG PO CHEW
81.0000 mg | CHEWABLE_TABLET | Freq: Every day | ORAL | Status: DC
  Administered 2018-09-26 – 2018-09-27 (×2): 81 mg via ORAL
  Filled 2018-09-25 (×3): qty 1

## 2018-09-25 MED ORDER — ALBUTEROL SULFATE (2.5 MG/3ML) 0.083% IN NEBU: 2.5000 mg | INHALATION_SOLUTION | RESPIRATORY_TRACT | Status: DC | PRN

## 2018-09-25 MED ORDER — ONDANSETRON HCL 4 MG/2ML IJ SOLN
4.0000 mg | Freq: Four times a day (QID) | INTRAMUSCULAR | Status: DC | PRN
  Administered 2018-10-01 – 2018-10-02 (×2): 4 mg via INTRAVENOUS
  Filled 2018-09-25 (×2): qty 2

## 2018-09-25 MED ORDER — IPRATROPIUM-ALBUTEROL 0.5-2.5 (3) MG/3ML IN SOLN
3.0000 mL | Freq: Once | RESPIRATORY_TRACT | Status: AC
  Administered 2018-09-25: 3 mL via RESPIRATORY_TRACT
  Filled 2018-09-25: qty 3

## 2018-09-25 MED ORDER — DOCUSATE SODIUM 100 MG PO CAPS
100.0000 mg | ORAL_CAPSULE | Freq: Two times a day (BID) | ORAL | Status: DC
  Administered 2018-09-25 – 2018-10-03 (×16): 100 mg via ORAL
  Filled 2018-09-25 (×16): qty 1

## 2018-09-25 MED ORDER — ACETAMINOPHEN 325 MG PO TABS
650.0000 mg | ORAL_TABLET | Freq: Four times a day (QID) | ORAL | Status: DC | PRN
  Administered 2018-09-25 – 2018-10-03 (×4): 650 mg via ORAL
  Filled 2018-09-25 (×4): qty 2

## 2018-09-25 MED ORDER — ACETAMINOPHEN 650 MG RE SUPP: 650.0000 mg | Freq: Four times a day (QID) | RECTAL | Status: DC | PRN

## 2018-09-25 MED ORDER — ACETAMINOPHEN 500 MG PO TABS
1000.0000 mg | ORAL_TABLET | Freq: Once | ORAL | Status: AC
  Administered 2018-09-25: 1000 mg via ORAL
  Filled 2018-09-25: qty 2

## 2018-09-25 MED ORDER — IOPAMIDOL (ISOVUE-370) INJECTION 76%
100.0000 mL | Freq: Once | INTRAVENOUS | Status: AC | PRN
  Administered 2018-09-25: 51 mL via INTRAVENOUS

## 2018-09-25 MED ORDER — LIDOCAINE HCL 1 % IJ SOLN
INTRAMUSCULAR | Status: AC
  Filled 2018-09-25: qty 20

## 2018-09-25 MED ORDER — OXYCODONE HCL 5 MG PO TABS
10.0000 mg | ORAL_TABLET | ORAL | Status: DC | PRN
  Administered 2018-09-26 – 2018-10-03 (×14): 10 mg via ORAL
  Filled 2018-09-25 (×14): qty 2

## 2018-09-25 MED ORDER — PREDNISONE 50 MG PO TABS: 50.0000 mg | ORAL_TABLET | Freq: Every day | ORAL | 0 refills | Status: DC

## 2018-09-25 NOTE — ED Provider Notes (Signed)
10:04 AM Assumed care from Dr. Roxanne Mins, please see their note for full history, physical and decision making until this point. In brief this is a 85 y.o. year old male who presented to the ED tonight with Shortness of Breath     New dx of lung cancer but reason for being here is because his O2 concentrator doesn't work. Plan for Care management consult to attempt to get concentrator so that he can be discharged on chronic oxygen.   Not able to get the order because he is from [place] and doesn't have a PCP here and will need 'admission' to hospital to get appropriate assessment.   D/w Dr. Lorin Mercy, [facility], who will admit to observation for same.   Labs, studies and imaging reviewed by myself and considered in medical decision making if ordered. Imaging interpreted by radiology.  Labs Reviewed  COMPREHENSIVE METABOLIC PANEL - Abnormal; Notable for the following components:      Result Value   Albumin 3.3 (*)    All other components within normal limits  CBC WITH DIFFERENTIAL/PLATELET - Abnormal; Notable for the following components:   WBC 11.9 (*)    RBC 3.85 (*)    Hemoglobin 11.1 (*)    HCT 35.7 (*)    Neutro Abs 8.8 (*)    Abs Immature Granulocytes 0.09 (*)    All other components within normal limits  D-DIMER, QUANTITATIVE (NOT AT [facility]) - Abnormal; Notable for the following components:   D-Dimer, Quant 7.26 (*)    All other components within normal limits  BRAIN NATRIURETIC PEPTIDE  I-STAT TROPONIN, ED    CT Angio Chest PE W and/or Wo Contrast  Final Result    DG Chest 2 View  Final Result      No follow-ups on file.    Merrily Pew, MD 09/25/18 1006

## 2018-09-25 NOTE — Discharge Instructions (Addendum)
Continue to use your albuterol at home.  Follow up with the lung specialist and with your primary care provider to further evaluate the nodule in your lung. There is a strong concern that it is cancer.

## 2018-09-25 NOTE — Discharge Planning (Signed)
EDCM spoke with pt at bedside regarding broken oxygen concentrator.  [place] and was [place] by the company and needs to requalify for oxygen.  EDCM spoke with EDP regarding observation status.  Will continue to follow for discharge needs.

## 2018-09-25 NOTE — ED Provider Notes (Signed)
[facility] EMERGENCY DEPARTMENT Provider Note   CSN: [id number] Arrival date & time: 09/25/18  0116     History   Chief Complaint Chief Complaint  Patient presents with  . Shortness of Breath    HPI [name] [name] is a 85 y.o. male.  The history is provided by the patient.  Shortness of Breath  He has history of diabetes, hypertension, hyperlipidemia, COPD and was brought in by ambulance because of difficulty breathing.  He had been a patient at [facility], but left [place] [facility] earlier today.  Patient states that they were not doing anything for him and his insurance was not going to pay for this today.  At home, he has an oxygen generator, but it is not working.  He has been having sharp pain across his chest for an undetermined amount of time.  Pain is worse with palpation and with deep breathing.  He rates pain at 4/10 at rest, but will go up to 10/10 if he coughs.  He does have a cough productive of a small amount of thick green sputum.  He denies fever chills or sweats.  EMS had reported that he appeared cyanotic when they got there and that he improved with supplemental oxygen.  He is also complaining of being generally weak.  Past Medical History:  Diagnosis Date  . ABSCESS 12/03/2009  . ABSCESS, FINGER 04/07/2010  . ANXIETY 11/03/2009  . ASTHMA 11/03/2009  . CHRONIC OBSTRUCTIVE PULMONARY DISEASE, ACUTE EXACERBATION 11/03/2009  . Colon cancer ([place])   . COPD 11/03/2009  . DEPRESSION 11/03/2009  . DIABETES MELLITUS, TYPE II 11/03/2009  . [place] DISEASE, LUMBAR 11/03/2009  . EMPHYSEMA, BULLOUS 11/03/2009  . GERD 11/03/2009  . HYPERLIPIDEMIA 11/03/2009  . HYPERTENSION 11/03/2009  . Kidney stones 01/30/12   "I've had them 7 times; always have passed them"  . PEPTIC ULCER DISEASE 11/03/2009  . Pneumonia   . RASH-NONVESICULAR 11/03/2009  . RESTLESS LEG SYNDROME 11/03/2009  . Shortness of breath    "sometimes; at any time"  . SPINAL STENOSIS, LUMBAR  11/03/2009    Patient Active Problem List   Diagnosis Date Noted  . COPD with acute exacerbation ([place]) 07/26/2018  . Congestive heart failure ([place])   . Palliative care by specialist   . DNR (do not resuscitate)   . COPD exacerbation ([place]) 03/24/2017  . Hypoxia 03/24/2017  . Diabetes mellitus type 2 in nonobese ([place]) 03/24/2017  . Polysubstance abuse ([place]) 03/24/2017  . Pressure injury of skin 03/24/2017  . Overdose of benzodiazepine 01/16/2017  . Unresponsiveness   . HCAP (healthcare-associated pneumonia)   . Respiratory failure ([place]) 01/15/2017  . CVA (cerebral vascular accident) ([place]) 12/15/2016  . Chest pain 01/19/2017  . PNA (pneumonia) 03/17/2012  . Weakness generalized 03/17/2012  . Generalized weakness 01/30/2012  . Fall at home 01/30/2012  . Physical deconditioning 01/30/2012  . Nausea vomiting and diarrhea 01/15/2012  . UTI (urinary tract infection) 01/15/2012  . Cocaine abuse ([place]) 08/11/2011  . Tobacco abuse 08/11/2011  . Orthostasis 12/23/2010  . Dehydration 12/23/2010  . Abdominal pain, other specified site 12/23/2010  . Dizziness 12/23/2010  . Weight loss 12/23/2010  . Left lumbar radiculopathy 12/23/2010  . DIABETES MELLITUS, TYPE II 11/03/2009  . HYPERLIPIDEMIA 11/03/2009  . ANXIETY 11/03/2009  . DEPRESSION 11/03/2009  . RESTLESS LEG SYNDROME 11/03/2009  . HYPERTENSION 11/03/2009  . EMPHYSEMA, BULLOUS 11/03/2009  . ASTHMA 11/03/2009  . COPD 11/03/2009  . GERD 11/03/2009  . PEPTIC ULCER DISEASE  11/03/2009  . [place] DISEASE, LUMBAR 11/03/2009  . SPINAL STENOSIS, LUMBAR 11/03/2009  . NEPHROLITHIASIS, HX OF 11/03/2009    Past Surgical History:  Procedure Laterality Date  . Colon cancer surgery    . [place]   left  . INGUINAL HERNIA REPAIR  10/2011   left  . ROTATOR CUFF REPAIR  2003   left  . TONSILLECTOMY  1960        Home Medications    Prior to Admission medications   Medication Sig Start Date End Date Taking?  Authorizing Provider  albuterol (PROVENTIL HFA;VENTOLIN HFA) 108 (90 Base) MCG/ACT inhaler Inhale 2 puffs into the lungs every 6 (six) hours as needed. 07/29/18   Salary, Avel Peace, MD  aspirin 81 MG chewable tablet Chew 1 tablet (81 mg total) by mouth daily. 07/29/18   Salary, Avel Peace, MD  atorvastatin (LIPITOR) 40 MG tablet Take 1 tablet (40 mg total) by mouth daily. 07/29/18   Salary, Avel Peace, MD  Fluticasone-Salmeterol (ADVAIR) 250-50 MCG/DOSE AEPB Inhale 1 puff into the lungs 2 (two) times daily. 07/29/18   Salary, Avel Peace, MD  furosemide (LASIX) 20 MG tablet Take 1 tablet (20 mg total) by mouth daily as needed for fluid or edema. 07/29/18   Salary, Holly Bodily D, MD  metFORMIN (GLUCOPHAGE) 500 MG tablet Take 1 tablet (500 mg total) by mouth 2 (two) times daily with a meal. 07/29/18   Salary, Avel Peace, MD  metoprolol tartrate (LOPRESSOR) 50 MG tablet Take 1 tablet (50 mg total) by mouth 2 (two) times daily. 07/29/18   Salary, Avel Peace, MD  predniSONE (DELTASONE) 10 MG tablet Take 50 mg daily taper by 10 mg daily then s top 08/12/18   Fritzi Mandes, MD  tamsulosin (FLOMAX) 0.4 MG CAPS capsule Take 1 capsule (0.4 mg total) by mouth daily. 07/29/18   Salary, Avel Peace, MD    Family History Family History  Problem Relation Age of Onset  . Heart disease Father   . Heart disease Mother   . Cancer Brother        lung    Social History Social History   Tobacco Use  . Smoking status: Current Every Day Smoker    Packs/day: 1.00    Years: 41.00    Pack years: 41.00    Types: Cigarettes  . Smokeless tobacco: Never Used  . Tobacco comment: "stopped smoking 04/21/1991 then restarted in 2012"  Substance Use Topics  . Alcohol use: No  . Drug use: No     Allergies   Patient has no known allergies.   Review of Systems Review of Systems  Respiratory: Positive for shortness of breath.   All other systems reviewed and are negative.    Physical Exam Updated Vital Signs BP 120/65 (BP  Location: Left Arm)   Pulse 94   Temp 99.4 F (37.4 C) (Oral)   Resp (!) 22   Ht 5\' 11"  (1.803 m)   Wt 79.4 kg   SpO2 100%   BMI 24.41 kg/m   Physical Exam Vitals signs and nursing note reviewed.    85 year old male, resting comfortably and in no acute distress. Vital signs are significant for mildly elevated respiratory rate. Oxygen saturation is 100%, which is normal, but this is with oxygen via mask. Head is normocephalic and atraumatic. PERRLA, EOMI. Oropharynx is clear. Neck is nontender and supple without adenopathy or JVD. Back is nontender and there is no CVA tenderness. Lungs have bibasilar rales,  diffuse expiratory rhonchi. Chest is nontender. Heart has regular rate and rhythm without murmur. Abdomen is soft, flat, nontender without masses or hepatosplenomegaly and peristalsis is normoactive. Extremities have no cyanosis or edema, full range of motion is present. Skin is warm and dry without rash. Neurologic: Mental status is normal, cranial nerves are intact, there are no motor or sensory deficits.  ED Treatments / Results  Labs (all labs ordered are listed, but only abnormal results are displayed) Labs Reviewed  COMPREHENSIVE METABOLIC PANEL  CBC WITH DIFFERENTIAL/PLATELET  BRAIN NATRIURETIC PEPTIDE  D-DIMER, QUANTITATIVE (NOT AT [facility])  I-STAT TROPONIN, ED    EKG EKG Interpretation  Date/Time:  Tuesday September 25 2018 01:25:38 EST Ventricular Rate:  93 PR Interval:    QRS Duration: 97 QT Interval:  374 QTC Calculation: 466 R Axis:   72 Text Interpretation:  Sinus rhythm Prolonged PR interval Probable left atrial enlargement When compared with ECG of 08/25/2018, No significant change was found Confirmed by Delora Fuel ([id number]) on 09/25/2018 1:35:41 AM   Radiology No results found.  Procedures Procedures   Medications Ordered in ED Medications  ipratropium-albuterol (DUONEB) 0.5-2.5 (3) MG/3ML nebulizer solution 3 mL (has no administration in time  range)     Initial Impression / Assessment and Plan / ED Course  I have reviewed the triage vital signs and the nursing notes.  Pertinent labs & imaging results that were available during my care of the patient were reviewed by me and considered in my medical decision making (see chart for details).  Shortness of breath which probably is COPD exacerbation.  Will send for chest x-ray.  ECG shows no acute changes.  Records from [facility] are not available.  However, since he would have been at bedrest for extended period time and I am not sure if he received DVT prophylaxis, will check d-dimer to rule out pulmonary embolism.  He will be given albuterol with ipratropium via nebulizer.  Oxygen was turned down to 2 L/min and he has maintained adequate oxygenation with that.  Old records are reviewed confirming recent hospitalization at [facility] for cellulitis of his left hand, and transferred to [facility] at the end of that hospital stay (on December 19).  D-dimer is significantly elevated.  He is sent for CT angiogram which does show a nodule on the left which is probably malignant with probable malignant effusion.  Patient has been advised of these findings.  He has been resting comfortably maintaining adequate oxygen saturation on 2 L oxygen, and I feel he can safely go home once I am sure he has an oxygen concentrator.  He is discharged with a prescription for prednisone, given ambulatory referral to pulmonology for outpatient work-up of his lung nodule.  Final Clinical Impressions(s) / ED Diagnoses   Final diagnoses:  COPD exacerbation ([place])  Mass of left lung  Normochromic normocytic anemia    ED Discharge Orders         Ordered    predniSONE (DELTASONE) 50 MG tablet  Daily     09/25/18 0654    Ambulatory referral to Pulmonology     09/25/18 1914           Delora Fuel, MD 78/29/56 [phone]

## 2018-09-25 NOTE — ED Notes (Signed)
Pt taken to IR

## 2018-09-25 NOTE — ED Triage Notes (Signed)
Pt here by EMS for shortness of breath.  EMS states pt was purple in the face and not on any oxygen when they arrived.  Oxygen was in the room but patient was not wearing any.  Pt states he was admitted to [facility] and left because they were not helping him.  Pt placed on 3L O2 by EMS and sats improved.  Diminished and wet in the bases.

## 2018-09-25 NOTE — H&P (Signed)
History and Physical    [name] [name] DPO:[id number] DOB: [birth year] DOA: 09/25/2018  PCP: McLean-Scocuzza, Nino Glow, MD Consultants:  None Patient coming from:  Home - lives alone, recently back to [facility] from [place], looking for a place to move but currently living in a motel; NOK: Son, [phone]  Chief Complaint: needs home O2  HPI: [name] [name] is a 85 y.o. male with medical history significant of RLS; HTN; HLD; DM; COPD on home O2; colon CA; and recent admission at [facility] for septic shock requiring intubation resulting from cellulitis with concern for necrotizing fasciitis.  He was discharged to [facility] for LTAC care and was released about 18 days.  He was having pain in his chest for about a week and decided to come in.  It comes and goes and has been happening since he was seen at [facility].  It gets worse with coughing, and it hurts to touch. Nothing makes it better.  +SOB.  His O2 concentrator was having trouble before he went in the hospital and now it only works for about 30 minutes before it quits.  His O2 level has been low.  +cough, productive of greenish mucus.  ED Course:  Patient must come into the hospital to qualify for a new O2 order because his concentrator broke. He is hypoxic without his home O2.  Appears to have new lung CA, incidentally.  Review of Systems: As per HPI; otherwise review of systems reviewed and negative.   Ambulatory Status:  Ambulates without assistance  Past Medical History:  Diagnosis Date  . ABSCESS 12/03/2009  . ABSCESS, FINGER 04/07/2010  . ANXIETY 11/03/2009  . ASTHMA 11/03/2009  . Colon cancer ([place])   . COPD 11/03/2009   on home O2  . DEPRESSION 11/03/2009  . DIABETES MELLITUS, TYPE II 11/03/2009  . [place] DISEASE, LUMBAR 11/03/2009  . EMPHYSEMA, BULLOUS 11/03/2009  . GERD 11/03/2009  . HYPERLIPIDEMIA 11/03/2009  . HYPERTENSION 11/03/2009  . Kidney stones 01/30/12   "I've had them 7 times; always have passed them"  . PEPTIC ULCER DISEASE  11/03/2009  . Pneumonia   . RASH-NONVESICULAR 11/03/2009  . RESTLESS LEG SYNDROME 11/03/2009  . Shortness of breath    "sometimes; at any time"  . SPINAL STENOSIS, LUMBAR 11/03/2009    Past Surgical History:  Procedure Laterality Date  . Colon cancer surgery    . [place]   left  . INGUINAL HERNIA REPAIR  10/2011   left  . ROTATOR CUFF REPAIR  2003   left  . TONSILLECTOMY  1960    Social History   Socioeconomic History  . Marital status: Widowed    Spouse name: Not on file  . Number of children: 2  . Years of education: Not on file  . Highest education level: Not on file  Occupational History    Employer: RETIRED  Social Needs  . Financial resource strain: Patient refused  . Food insecurity:    Worry: Patient refused    Inability: Patient refused  . Transportation needs:    Medical: Patient refused    Non-medical: Patient refused  Tobacco Use  . Smoking status: Current Every Day Smoker    Packs/day: 1.00    Years: 41.00    Pack years: 41.00    Types: Cigarettes  . Smokeless tobacco: Never Used  . Tobacco comment: "stopped smoking 04/21/1991 then restarted in 2012"  Substance and Sexual Activity  . Alcohol use: No  . Drug use:  No  . Sexual activity: Not Currently    Birth control/protection: None  Lifestyle  . Physical activity:    Days per week: Patient refused    Minutes per session: Patient refused  . Stress: Patient refused  Relationships  . Social connections:    Talks on phone: Patient refused    Gets together: Patient refused    Attends religious service: Patient refused    Active member of club or organization: Patient refused    Attends meetings of clubs or organizations: Patient refused    Relationship status: Patient refused  . Intimate partner violence:    Fear of current or ex partner: Patient refused    Emotionally abused: Patient refused    Physically abused: Patient refused    Forced sexual activity: Patient refused    Other Topics Concern  . Not on file  Social History Narrative  . Not on file    No Known Allergies  Family History  Problem Relation Age of Onset  . Heart disease Father   . Heart disease Mother   . Cancer Brother        lung    Prior to Admission medications   Medication Sig Start Date End Date Taking? Authorizing Provider  albuterol (PROVENTIL HFA;VENTOLIN HFA) 108 (90 Base) MCG/ACT inhaler Inhale 2 puffs into the lungs every 6 (six) hours as needed. 07/29/18  Yes Salary, Avel Peace, MD  aspirin 81 MG chewable tablet Chew 1 tablet (81 mg total) by mouth daily. 07/29/18  Yes Salary, Avel Peace, MD  atorvastatin (LIPITOR) 40 MG tablet Take 1 tablet (40 mg total) by mouth daily. 07/29/18  Yes Salary, Avel Peace, MD  Fluticasone-Salmeterol (ADVAIR) 250-50 MCG/DOSE AEPB Inhale 1 puff into the lungs 2 (two) times daily. 07/29/18  Yes Salary, Avel Peace, MD  furosemide (LASIX) 20 MG tablet Take 1 tablet (20 mg total) by mouth daily as needed for fluid or edema. 07/29/18  Yes Salary, Avel Peace, MD  metFORMIN (GLUCOPHAGE) 500 MG tablet Take 1 tablet (500 mg total) by mouth 2 (two) times daily with a meal. 07/29/18  Yes Salary, Montell D, MD  metoprolol tartrate (LOPRESSOR) 50 MG tablet Take 1 tablet (50 mg total) by mouth 2 (two) times daily. 07/29/18  Yes Salary, Avel Peace, MD  tamsulosin (FLOMAX) 0.4 MG CAPS capsule Take 1 capsule (0.4 mg total) by mouth daily. 07/29/18  Yes Salary, Avel Peace, MD  predniSONE (DELTASONE) 50 MG tablet Take 1 tablet (50 mg total) by mouth daily. 0/1/75   Delora Fuel, MD    Physical Exam: Vitals:   09/25/18 1045 09/25/18 1100 09/25/18 1115 09/25/18 1240  BP: 140/79 129/74 131/78 121/74  Pulse: 91 90 95 96  Resp: 20 16 16    Temp:      TempSrc:      SpO2: 95% 96% 98%   Weight:      Height:         General:  Appears calm and comfortable and is NAD Eyes:  PERRL, EOMI, normal lids, iris ENT:  grossly normal hearing, lips & tongue, mmm Neck:  no LAD,  masses or thyromegaly; no carotid bruits Cardiovascular:  RRR, no m/r/g. No LE edema.  Respiratory:   CTA bilaterally with no wheezes/rales/rhonchi.  Normal respiratory effort on [place] O2. Abdomen:  soft, NT, ND, NABS Back:   normal alignment, no CVAT Skin:  no rash or induration seen on limited exam Musculoskeletal:  grossly normal tone BUE/BLE, good ROM, no bony abnormality Lower extremity:  No LE edema.  Limited foot exam with no ulcerations.  2+ distal pulses. Psychiatric: blunted mood and affect, speech fluent and appropriate, AOx3 Neurologic:  CN 2-12 grossly intact, moves all extremities in coordinated fashion, sensation intact    Radiological Exams on Admission: Dg Chest 1 View  Result Date: 09/25/2018 CLINICAL DATA:  Post left thoracentesis, probable malignancy in the left chest by CT EXAM: CHEST  1 VIEW COMPARISON:  CT chest of 09/25/2018 and chest x-ray of the same day FINDINGS: Opacity at the left lung base again is noted most consistent with effusion and possibly atelectasis. Malignancy is not well seen, better visualized by CT chest. Chronic changes are noted in the lung bases. Cardiomegaly is stable. No acute bony abnormality is seen. IMPRESSION: 1. No pneumothorax after left thoracentesis. 2. Chronic changes at the lung bases. The nodular opacities at the left lung base by CT are not well seen by chest x-ray. 3. Stable cardiomegaly. Electronically Signed   By: Ivar Drape M.D.   On: 09/25/2018 12:01   Dg Chest 2 View  Result Date: 09/25/2018 CLINICAL DATA:  Shortness of breath. EXAM: CHEST - 2 VIEW COMPARISON:  Radiographs 08/26/2018, chest CT 06/26/2018 FINDINGS: Marked emphysema with chronic left lung hyperinflation. Nodular opacity in the left mid lung measures approximately 19 x 22 mm. Unchanged heart size and mediastinal contours with aortic atherosclerosis. Small right pleural effusion and adjacent basilar opacity. No pneumothorax. IMPRESSION: 1. Small right pleural effusion and  adjacent basilar opacity. 2. Nodular opacity in the left mid lung measures 19 x 22 mm, suspicious for neoplasm, increased from prior CT. 3. Advanced emphysema.  Emphysema (ICD10-J43.9). Electronically Signed   By: Keith Rake M.D.   On: 09/25/2018 03:07   Ct Angio Chest Pe W And/or Wo Contrast  Result Date: 09/25/2018 CLINICAL DATA:  Intermediate probability for pulmonary embolism. EXAM: CT ANGIOGRAPHY CHEST WITH CONTRAST TECHNIQUE: Multidetector CT imaging of the chest was performed using the standard protocol during bolus administration of intravenous contrast. Multiplanar CT image reconstructions and MIPs were obtained to evaluate the vascular anatomy. CONTRAST:  21mL ISOVUE-370 IOPAMIDOL (ISOVUE-370) INJECTION 76% COMPARISON:  06/26/2018 FINDINGS: Cardiovascular: Negative for pulmonary embolism. Normal heart size. No pericardial effusion. Aortic and coronary atherosclerosis. Mediastinum/Nodes: Left hilar and left mediastinal lymphadenopathy. A rounded AP window lymph node measures 27 mm in diameter and is new from prior. Lungs/Pleura: 3.1 cm masslike finding in the left upper lobe along the major fissure. There are additional nodules within the left lung, both pleural and peribronchovascular. Although multiple nodules are seen, there limited to the upper lobe. A bilobed nodule in the lingula measures 25 x 18 mm on series 2, image 102. There is a small left pleural effusion with subpleural nodular appearance, likely malignant. Dependent atelectasis. Bullous emphysema worse on the left. Upper Abdomen: No acute finding. Musculoskeletal: No acute or aggressive finding Review of the MIP images confirms the above findings. IMPRESSION: 1. 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 2. Negative for pulmonary embolism. 3. Bullous emphysema. Electronically Signed   By: Monte Fantasia  M.D.   On: 09/25/2018 05:42    EKG: Independently reviewed.  NSR with rate 93; nonspecific ST changes with no evidence of acute ischemia; NSCSLT   Labs on Admission: I have personally reviewed the available labs and imaging studies at the time of the admission.  Pertinent labs:   Essentially normal CMP WBC  11.9 Hgb 11.1 BNP 30.5 Troponin 0.01 D-dimer 7.26  Assessment/Plan Principal Problem:   Chronic respiratory failure (HCC) Active Problems:   Essential hypertension   Diabetes mellitus type 2 in nonobese (HCC)   Polysubstance abuse (HCC)   Mass of upper lobe of left lung   Chronic respiratory failure from COPD -Patient with known h/o COPD on chronic home O2 -He moved from [place] to [place] several months ago -His compressor does not work and he is not able to obtain another one without documented need for home O2 in a non-emergent setting -The patient will be observed overnight with plan for walk of life in AM -CM consult for assistance with O2 -He does not have current evidence of exacerbation -Albuterol nebs prn with standing Duonebs ordered -Continue Advair (formulary substitution for Dulera)  LUL lung mass -Patient with c/o acute on chronic pleuritic CP -He was found to have a LUL lung mass in the region where he is complaining of pain, suspicious for bronchogenic carcinoma -He will need outpatient evaluation for this -However, while here will request IR consultation for attempt at draining small pleural effusion to send for evaluation  HTN -Continue Lopressor  DM -Recent A1c 7.1 on 11/7 -hold Glucophage -Cover with moderate-scale SSI  Polysubstance abuse -Patient with h/o substance abuse -Check UDS  Homelessness -He reports living in a motel and having difficulty finding affordable housing -Will request SW consult   DVT prophylaxis:  Lovenox Code Status:  DNR - confirmed with patient Family Communication: None present Disposition Plan:  Home once  clinically improved Consults called: IR; CM/SW  Admission status: It is my clinical opinion that referral for OBSERVATION is reasonable and necessary in this patient based on the above information provided. The aforementioned taken together are felt to place the patient at high risk for further clinical deterioration. However it is anticipated that the patient may be medically stable for discharge from the hospital within 24 to 48 hours.    Karmen Bongo MD [facility]  If note is complete, please contact covering daytime or nighttime physician. [email or web address] Password [facility]  09/25/2018, 12:46 PM

## 2018-09-25 NOTE — ED Notes (Signed)
Confirmed with Dr Lorin Mercy that pt's chest pain is pluertic and associated with the lung mass. He is appropriate for med-surg level care

## 2018-09-25 NOTE — Progress Notes (Signed)
Thank you Dr. Marcelle Smiling please refer to lung doctors in [name] and hematology/oncology for left chest mass asap  Thanks  [place]

## 2018-09-26 ENCOUNTER — Encounter (HOSPITAL_COMMUNITY): Payer: Self-pay | Admitting: Physician Assistant

## 2018-09-26 ENCOUNTER — Observation Stay (HOSPITAL_COMMUNITY): Payer: Medicare PPO

## 2018-09-26 DIAGNOSIS — J449 Chronic obstructive pulmonary disease, unspecified: Secondary | ICD-10-CM | POA: Diagnosis not present

## 2018-09-26 DIAGNOSIS — J90 Pleural effusion, not elsewhere classified: Secondary | ICD-10-CM | POA: Diagnosis not present

## 2018-09-26 DIAGNOSIS — R0902 Hypoxemia: Secondary | ICD-10-CM | POA: Diagnosis not present

## 2018-09-26 DIAGNOSIS — R05 Cough: Secondary | ICD-10-CM | POA: Diagnosis not present

## 2018-09-26 DIAGNOSIS — Z87891 Personal history of nicotine dependence: Secondary | ICD-10-CM | POA: Diagnosis not present

## 2018-09-26 DIAGNOSIS — C3410 Malignant neoplasm of upper lobe, unspecified bronchus or lung: Secondary | ICD-10-CM | POA: Diagnosis present

## 2018-09-26 DIAGNOSIS — R918 Other nonspecific abnormal finding of lung field: Secondary | ICD-10-CM | POA: Diagnosis not present

## 2018-09-26 DIAGNOSIS — I361 Nonrheumatic tricuspid (valve) insufficiency: Secondary | ICD-10-CM

## 2018-09-26 DIAGNOSIS — Z8249 Family history of ischemic heart disease and other diseases of the circulatory system: Secondary | ICD-10-CM | POA: Diagnosis not present

## 2018-09-26 DIAGNOSIS — N400 Enlarged prostate without lower urinary tract symptoms: Secondary | ICD-10-CM | POA: Diagnosis present

## 2018-09-26 DIAGNOSIS — Z9981 Dependence on supplemental oxygen: Secondary | ICD-10-CM | POA: Diagnosis not present

## 2018-09-26 DIAGNOSIS — G893 Neoplasm related pain (acute) (chronic): Secondary | ICD-10-CM | POA: Diagnosis present

## 2018-09-26 DIAGNOSIS — Z66 Do not resuscitate: Secondary | ICD-10-CM | POA: Diagnosis present

## 2018-09-26 DIAGNOSIS — J910 Malignant pleural effusion: Secondary | ICD-10-CM | POA: Diagnosis present

## 2018-09-26 DIAGNOSIS — J439 Emphysema, unspecified: Secondary | ICD-10-CM | POA: Diagnosis present

## 2018-09-26 DIAGNOSIS — E785 Hyperlipidemia, unspecified: Secondary | ICD-10-CM | POA: Diagnosis present

## 2018-09-26 DIAGNOSIS — F17211 Nicotine dependence, cigarettes, in remission: Secondary | ICD-10-CM

## 2018-09-26 DIAGNOSIS — D649 Anemia, unspecified: Secondary | ICD-10-CM

## 2018-09-26 DIAGNOSIS — M940 Chondrocostal junction syndrome [Tietze]: Secondary | ICD-10-CM | POA: Diagnosis present

## 2018-09-26 DIAGNOSIS — Z8673 Personal history of transient ischemic attack (TIA), and cerebral infarction without residual deficits: Secondary | ICD-10-CM | POA: Diagnosis not present

## 2018-09-26 DIAGNOSIS — J9621 Acute and chronic respiratory failure with hypoxia: Secondary | ICD-10-CM | POA: Diagnosis present

## 2018-09-26 DIAGNOSIS — I340 Nonrheumatic mitral (valve) insufficiency: Secondary | ICD-10-CM | POA: Diagnosis not present

## 2018-09-26 DIAGNOSIS — R0981 Nasal congestion: Secondary | ICD-10-CM | POA: Diagnosis not present

## 2018-09-26 DIAGNOSIS — J189 Pneumonia, unspecified organism: Secondary | ICD-10-CM | POA: Diagnosis present

## 2018-09-26 DIAGNOSIS — J9611 Chronic respiratory failure with hypoxia: Secondary | ICD-10-CM | POA: Diagnosis not present

## 2018-09-26 DIAGNOSIS — M48061 Spinal stenosis, lumbar region without neurogenic claudication: Secondary | ICD-10-CM | POA: Diagnosis present

## 2018-09-26 DIAGNOSIS — Z5320 Procedure and treatment not carried out because of patient's decision for unspecified reasons: Secondary | ICD-10-CM | POA: Diagnosis not present

## 2018-09-26 DIAGNOSIS — Z7189 Other specified counseling: Secondary | ICD-10-CM | POA: Diagnosis not present

## 2018-09-26 DIAGNOSIS — Z7982 Long term (current) use of aspirin: Secondary | ICD-10-CM | POA: Diagnosis not present

## 2018-09-26 DIAGNOSIS — G2581 Restless legs syndrome: Secondary | ICD-10-CM | POA: Diagnosis present

## 2018-09-26 DIAGNOSIS — K219 Gastro-esophageal reflux disease without esophagitis: Secondary | ICD-10-CM | POA: Diagnosis present

## 2018-09-26 DIAGNOSIS — Z85038 Personal history of other malignant neoplasm of large intestine: Secondary | ICD-10-CM | POA: Diagnosis not present

## 2018-09-26 DIAGNOSIS — R531 Weakness: Secondary | ICD-10-CM | POA: Diagnosis present

## 2018-09-26 DIAGNOSIS — I10 Essential (primary) hypertension: Secondary | ICD-10-CM | POA: Diagnosis present

## 2018-09-26 DIAGNOSIS — R0602 Shortness of breath: Secondary | ICD-10-CM | POA: Diagnosis present

## 2018-09-26 DIAGNOSIS — E119 Type 2 diabetes mellitus without complications: Secondary | ICD-10-CM | POA: Diagnosis present

## 2018-09-26 DIAGNOSIS — J9610 Chronic respiratory failure, unspecified whether with hypoxia or hypercapnia: Secondary | ICD-10-CM

## 2018-09-26 DIAGNOSIS — Z7984 Long term (current) use of oral hypoglycemic drugs: Secondary | ICD-10-CM | POA: Diagnosis not present

## 2018-09-26 DIAGNOSIS — Z590 Homelessness: Secondary | ICD-10-CM | POA: Diagnosis not present

## 2018-09-26 DIAGNOSIS — C3492 Malignant neoplasm of unspecified part of left bronchus or lung: Secondary | ICD-10-CM | POA: Diagnosis not present

## 2018-09-26 DIAGNOSIS — J441 Chronic obstructive pulmonary disease with (acute) exacerbation: Secondary | ICD-10-CM | POA: Diagnosis not present

## 2018-09-26 LAB — ECHOCARDIOGRAM COMPLETE
Height: 71 in
WEIGHTICAEL: 2776.03 [oz_av]

## 2018-09-26 LAB — CBC
HCT: 35.1 % — ABNORMAL LOW (ref 39.0–52.0)
Hemoglobin: 11.3 g/dL — ABNORMAL LOW (ref 13.0–17.0)
MCH: 29.1 pg (ref 26.0–34.0)
MCHC: 32.2 g/dL (ref 30.0–36.0)
MCV: 90.5 fL (ref 80.0–100.0)
PLATELETS: 272 10*3/uL (ref 150–400)
RBC: 3.88 MIL/uL — ABNORMAL LOW (ref 4.22–5.81)
RDW: 15.1 % (ref 11.5–15.5)
WBC: 10.9 10*3/uL — ABNORMAL HIGH (ref 4.0–10.5)
nRBC: 0 % (ref 0.0–0.2)

## 2018-09-26 LAB — BASIC METABOLIC PANEL
Anion gap: 11 (ref 5–15)
BUN: 19 mg/dL (ref 8–23)
CO2: 21 mmol/L — ABNORMAL LOW (ref 22–32)
Calcium: 9.2 mg/dL (ref 8.9–10.3)
Chloride: 105 mmol/L (ref 98–111)
Creatinine, Ser: 1.12 mg/dL (ref 0.61–1.24)
GFR calc Af Amer: >60 mL/min (ref >60)
Glucose, Bld: 119 mg/dL — ABNORMAL HIGH (ref 70–99)
POTASSIUM: 3.9 mmol/L (ref 3.5–5.1)
Sodium: 137 mmol/L (ref 135–145)

## 2018-09-26 LAB — TROPONIN I
Troponin I: <0.03 ng/mL (ref <0.03)
Troponin I: <0.03 ng/mL (ref <0.03)

## 2018-09-26 MED ORDER — PERFLUTREN LIPID MICROSPHERE
1.0000 mL | INTRAVENOUS | Status: AC | PRN
  Administered 2018-09-26: 3 mL via INTRAVENOUS
  Filled 2018-09-26: qty 10

## 2018-09-26 MED ORDER — GUAIFENESIN-DM 100-10 MG/5ML PO SYRP
5.0000 mL | ORAL_SOLUTION | ORAL | Status: DC | PRN
  Administered 2018-09-27 – 2018-10-04 (×14): 5 mL via ORAL
  Filled 2018-09-26 (×14): qty 5

## 2018-09-26 MED ORDER — PERFLUTREN LIPID MICROSPHERE
INTRAVENOUS | Status: AC
  Administered 2018-09-26: 3 mL via INTRAVENOUS
  Filled 2018-09-26: qty 10

## 2018-09-26 MED ORDER — NITROGLYCERIN 0.4 MG SL SUBL: 0.4000 mg | SUBLINGUAL_TABLET | SUBLINGUAL | Status: DC | PRN

## 2018-09-26 MED ORDER — IBUPROFEN 600 MG PO TABS
600.0000 mg | ORAL_TABLET | Freq: Four times a day (QID) | ORAL | Status: DC | PRN
  Administered 2018-10-01 – 2018-10-03 (×5): 600 mg via ORAL
  Filled 2018-09-26 (×5): qty 1

## 2018-09-26 NOTE — Progress Notes (Addendum)
Spoke w patient at bedside. He states that he has a POC through [place] that is broken. He states that he is staying at the [facility] on [address]. He states that he has a car and his broken POC is in it and his son, who lives in [place] will be bringing it to the hospital.  He states he recently came up from [facility], did not offer why. He states that he is looking for more permanent housing. Spoke with Caryl Pina at [facility]. [phone] who states that he will call the patient's room to verify address and state of POC machine. He will need patient to bring it to hospital so that if approved it can be switched out. Caryl Pina states he will obtain needed info from patient and call CM back with plan. Awaiting callback from [place]  Per Alphonzo Grieve patient can be set up as new POC, order amended. [facility] will pull sats and orders from chart and deliver to patient room

## 2018-09-26 NOTE — Progress Notes (Signed)
Patient refusing phelebtomy to stick patient for labs. This RN spoke with patient and educated him about what labs were needed to be drawn. He was informed that a CBC and Basic Metabolic Panel were ordered to be drawn this AM. Attempted to reschedule lab draw for later in the AM, however patient refused to reschedule at a later time. Patient stats "It makes no difference to me what time you stick me. A stick is a stick and I am tired of it."

## 2018-09-26 NOTE — Progress Notes (Signed)
   09/26/18 1040  Clinical Encounter Type  Visited With Patient  Visit Type Initial  Referral From Nurse  Consult/Referral To Chaplain  The chaplain responded to the Pt. spiritual care consult for prayer.  The Pt. was sitting up when the chaplain visited.  The Pt. openly shared stories impacting his physical and spiritual life with the chaplain.  The Pt. shared he is a peace with whatever his health diagnosis brings to his life. The chaplain offered F/U spiritual care as needed after the chaplain and Pt. prayed together.

## 2018-09-26 NOTE — Progress Notes (Signed)
PROGRESS NOTE    [name] [name]  PYP:[id number] DOB: 1934-01-18 DOA: 09/25/2018 PCP: McLean-Scocuzza, Nino Glow, MD   Brief Narrative: Patient is a 85 year old homeless man with medical history significant for hypertension, hyperlipidemia, diabetes, COPD on home oxygen,  who initially presented to the emergency department with complaint of O2 concentrator not working and requested for arrangement of home oxygen.  He was also complaining of right-sided chest pain, cough and bloody sputum.  Found to be hypoxic off oxygen.  CT chest with contrast on presentation showed 3.1 cm masslike finding in the left upper lobe along the major fissure, additional nodules within the left lung, both pleural and peribronchovascular, small left-sided pleural effusion.  Underwent US guided thoracentesis today of the left sided pleural fluid.  He has smoked for last several years.  Oncology also consulted today for high suspicion of lung cancer.  Assessment & Plan:   Principal Problem:   Chronic respiratory failure (HCC) Active Problems:   Essential hypertension   Diabetes mellitus type 2 in nonobese (HCC)   Polysubstance abuse ([place])   Mass of upper lobe of left lung  Left upper lobe lung mass: Has history of smoking for last several years.  Presented with chest pain.  US guided thoracentesis of the left side done today.  We will follow-up cytology.  Given high likelihood of malignancy, I have requested for oncology evaluation for the follow-up and management plan.  Chronic respiratory insufficiency: History of COPD with home oxygen.  Apparently his oxygen concentrator is not working so he presented here.  He qualified for home oxygen.  Will arrange oxygen on discharge.  Continue bronchodilators as needed.  Chest pain: Very atypical.  Complains of both right-sided and left-sided chest pain.  Not associated with exertion.  POC troponin was negative.  Will cycle troponins.  Continue pain management.Will check  echocardiogram  Hypertension: Currently blood pressure stable.  Continue current medications  Diabetes type 2: Continue sliding scale insulin here.  Recent hemoglobin of 7.1 as per 11/7.  On Glucophage at home.  Smoking/polysubstance abuse: Had been smoking for last several years.  He said he quit smoking about a month ago.  Also has history of polysubstance abuse.  Counseled for cessation.  BPH: Continue tamsulosin            DVT prophylaxis:Lovenox Code Status: Full Family Communication: None present at the bedside Disposition Plan: Home after full work-up  Consultants: Oncology  Procedures: None  Antimicrobials: None  Subjective: Patient seen and examined the bedside this morning.  Remains hemodynamically stable.  Complains of chest pain on the left side.  Qualified  for home oxygen. Objective: Vitals:   09/25/18 2025 09/25/18 2029 09/26/18 0008 09/26/18 0720  BP:   118/63 122/72  Pulse:   80 68  Resp:   18 15  Temp:   98.2 F (36.8 C)   TempSrc:      SpO2: 95% 95% 97% 99%  Weight:      Height:        Intake/Output Summary (Last 24 hours) at 09/26/2018 1331 Last data filed at 09/26/2018 0900 Gross per 24 hour  Intake 450 ml  Output 300 ml  Net 150 ml   Filed Weights   09/25/18 0123 09/25/18 1743  Weight: 79.4 kg 78.7 kg    Examination:  General exam: Not in distress,average built HEENT:PERRL,Oral mucosa moist, Ear/Nose normal on gross exam Respiratory system: Bilateral equal air entry, normal vesicular breath sounds, no wheezes or crackles  Cardiovascular system:  S1 & S2 heard, RRR. No JVD, murmurs, rubs, gallops or clicks. No pedal edema. Gastrointestinal system: Abdomen is nondistended, soft and nontender. No organomegaly or masses felt. Normal bowel sounds heard. Central nervous system: Alert and oriented. No focal neurological deficits. Extremities: No edema, no clubbing ,no cyanosis, distal peripheral pulses palpable. Skin: No rashes, lesions or  ulcers,no icterus ,no pallor MSK: Normal muscle bulk,tone ,power Psychiatry: Judgement and insight appear normal. Mood & affect appropriate.     Data Reviewed: I have personally reviewed following labs and imaging studies  CBC: Recent Labs  Lab 09/25/18 0306  WBC 11.9*  NEUTROABS 8.8*  HGB 11.1*  HCT 35.7*  MCV 92.7  PLT 503   Basic Metabolic Panel: Recent Labs  Lab 09/25/18 0306  NA 139  K 3.7  CL 103  CO2 26  GLUCOSE 97  BUN 23  CREATININE 1.11  CALCIUM 9.3   GFR: Estimated Creatinine Clearance: 54.6 mL/min (by C-G formula based on SCr of 1.11 mg/dL). Liver Function Tests: Recent Labs  Lab 09/25/18 0306  AST 16  ALT 15  ALKPHOS 67  BILITOT 0.7  PROT 7.0  ALBUMIN 3.3*   No results for input(s): LIPASE, AMYLASE in the last 168 hours. No results for input(s): AMMONIA in the last 168 hours. Coagulation Profile: No results for input(s): INR, PROTIME in the last 168 hours. Cardiac Enzymes: No results for input(s): CKTOTAL, CKMB, CKMBINDEX, TROPONINI in the last 168 hours. BNP (last 3 results) No results for input(s): PROBNP in the last 8760 hours. HbA1C: No results for input(s): HGBA1C in the last 72 hours. CBG: No results for input(s): GLUCAP in the last 168 hours. Lipid Profile: No results for input(s): CHOL, HDL, LDLCALC, TRIG, CHOLHDL, LDLDIRECT in the last 72 hours. Thyroid Function Tests: No results for input(s): TSH, T4TOTAL, FREET4, T3FREE, THYROIDAB in the last 72 hours. Anemia Panel: No results for input(s): VITAMINB12, FOLATE, FERRITIN, TIBC, IRON, RETICCTPCT in the last 72 hours. Sepsis Labs: No results for input(s): PROCALCITON, LATICACIDVEN in the last 168 hours.  Recent Results (from the past 240 hour(s))  Gram stain     Status: None   Collection Time: 09/25/18 11:47 AM  Result Value Ref Range Status   Specimen Description PLEURAL LEFT  Final   Special Requests NONE  Final   Gram Stain   Final    FEW WBC PRESENT,BOTH PMN AND  MONONUCLEAR NO ORGANISMS SEEN Performed at [facility], [address] [place], [place] [address]    Report Status 09/25/2018 FINAL  Final  Culture, body fluid-bottle     Status: None (Preliminary result)   Collection Time: 09/25/18 11:47 AM  Result Value Ref Range Status   Specimen Description PLEURAL LEFT  Final   Special Requests NONE  Final   Culture   Final    NO GROWTH 1 DAY Performed at [facility], [address], [place], [place] [address]    Report Status PENDING  Incomplete         Radiology Studies: Dg Chest 1 View  Result Date: 09/25/2018 CLINICAL DATA:  Post left thoracentesis, probable malignancy in the left chest by CT EXAM: CHEST  1 VIEW COMPARISON:  CT chest of 09/25/2018 and chest x-ray of the same day FINDINGS: Opacity at the left lung base again is noted most consistent with effusion and possibly atelectasis. Malignancy is not well seen, better visualized by CT chest. Chronic changes are noted in the lung bases. Cardiomegaly is stable. No acute  bony abnormality is seen. IMPRESSION: 1. No pneumothorax after left thoracentesis. 2. Chronic changes at the lung bases. The nodular opacities at the left lung base by CT are not well seen by chest x-ray. 3. Stable cardiomegaly. Electronically Signed   By: Ivar Drape M.D.   On: 09/25/2018 12:01   Dg Chest 2 View  Result Date: 09/25/2018 CLINICAL DATA:  Shortness of breath. EXAM: CHEST - 2 VIEW COMPARISON:  Radiographs 08/26/2018, chest CT 06/26/2018 FINDINGS: Marked emphysema with chronic left lung hyperinflation. Nodular opacity in the left mid lung measures approximately 19 x 22 mm. Unchanged heart size and mediastinal contours with aortic atherosclerosis. Small right pleural effusion and adjacent basilar opacity. No pneumothorax. IMPRESSION: 1. Small right pleural effusion and adjacent basilar opacity. 2. Nodular opacity in the left mid lung measures 19 x 22 mm, suspicious for neoplasm, increased from prior CT.  3. Advanced emphysema.  Emphysema (ICD10-J43.9). Electronically Signed   By: Keith Rake M.D.   On: 09/25/2018 03:07   Ct Angio Chest Pe W And/or Wo Contrast  Result Date: 09/25/2018 CLINICAL DATA:  Intermediate probability for pulmonary embolism. EXAM: CT ANGIOGRAPHY CHEST WITH CONTRAST TECHNIQUE: Multidetector CT imaging of the chest was performed using the standard protocol during bolus administration of intravenous contrast. Multiplanar CT image reconstructions and MIPs were obtained to evaluate the vascular anatomy. CONTRAST:  69mL ISOVUE-370 IOPAMIDOL (ISOVUE-370) INJECTION 76% COMPARISON:  06/26/2018 FINDINGS: Cardiovascular: Negative for pulmonary embolism. Normal heart size. No pericardial effusion. Aortic and coronary atherosclerosis. Mediastinum/Nodes: Left hilar and left mediastinal lymphadenopathy. A rounded AP window lymph node measures 27 mm in diameter and is new from prior. Lungs/Pleura: 3.1 cm masslike finding in the left upper lobe along the major fissure. There are additional nodules within the left lung, both pleural and peribronchovascular. Although multiple nodules are seen, there limited to the upper lobe. A bilobed nodule in the lingula measures 25 x 18 mm on series 2, image 102. There is a small left pleural effusion with subpleural nodular appearance, likely malignant. Dependent atelectasis. Bullous emphysema worse on the left. Upper Abdomen: No acute finding. Musculoskeletal: No acute or aggressive finding Review of the MIP images confirms the above findings. IMPRESSION: 1. 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 2. Negative for pulmonary embolism. 3. Bullous emphysema. Electronically Signed   By: Monte Fantasia M.D.   On: 09/25/2018 05:42   Ir Thoracentesis Asp Pleural Space W/img Guide  Result Date: 09/26/2018 INDICATION: Chest pains  shortness of breath. Left pleural effusion. Request for diagnostic and therapeutic thoracentesis. EXAM: ULTRASOUND GUIDED LEFT THORACENTESIS MEDICATIONS: 1% lidocaine 10 mL COMPLICATIONS: None immediate. PROCEDURE: An ultrasound guided thoracentesis was thoroughly discussed with the patient and questions answered. The benefits, risks, alternatives and complications were also discussed. The patient understands and wishes to proceed with the procedure. Written consent was obtained. Ultrasound was performed to localize and mark an adequate pocket of fluid in the left chest. The area was then prepped and draped in the normal sterile fashion. 1% Lidocaine was used for local anesthesia. Under ultrasound guidance a 6 Fr Safe-T-Centesis catheter was introduced. Thoracentesis was performed. The catheter was removed and a dressing applied. FINDINGS: A total of approximately 300 mL of clear red fluid was removed. Samples were sent to the laboratory as requested by the clinical team. IMPRESSION: Successful ultrasound guided left thoracentesis yielding 300 mL of pleural fluid. No pneumothorax on post-procedure chest  x-ray. Read by: Gareth Eagle, PA-C Electronically Signed   By: Corrie Mckusick D.O.   On: 09/26/2018 08:45        Scheduled Meds: . aspirin  81 mg Oral Daily  . atorvastatin  40 mg Oral Daily  . docusate sodium  100 mg Oral BID  . enoxaparin (LOVENOX) injection  40 mg Subcutaneous Q24H  . ipratropium-albuterol  3 mL Nebulization TID  . metoprolol tartrate  50 mg Oral BID  . mometasone-formoterol  2 puff Inhalation BID  . tamsulosin  0.4 mg Oral Daily   Continuous Infusions:   LOS: 0 days    Time spent: 35 mins.More than 50% of that time was spent in counseling and/or coordination of care.      Shelly Coss, MD [facility] Pager [phone]  If 7PM-7AM, please contact night-coverage [email or web address] Password [email or web address] 09/26/2018, 1:31 PM

## 2018-09-26 NOTE — Progress Notes (Signed)
SATURATION QUALIFICATIONS: (This note is used to comply with regulatory documentation for home oxygen)  Patient Saturations on Room Air at Rest = 91%  Patient Saturations on Room Air while Ambulating = 82%  Patient Saturations on 2 Liters of oxygen while Ambulating = 94%  Please briefly explain why patient needs home oxygen: pt having SOB when up ambulating.

## 2018-09-26 NOTE — Progress Notes (Signed)
  Echocardiogram 2D Echocardiogram with definity has been performed.  [name] [name] M 09/26/2018, 3:47 PM

## 2018-09-26 NOTE — Procedures (Signed)
PROCEDURE SUMMARY:  Successful US guided left thoracentesis. Yielded 300 mL of clear red fluid. Patient tolerated procedure well. No immediate complications. EBL = trace  Specimen was sent for labs.  Post procedure chest X-ray reveals no pneumothorax  Alfretta Pinch S Jazmynn Pho PA-C 09/26/2018 8:46 AM

## 2018-09-26 NOTE — Consult Note (Signed)
[place] [phone]   [phone]  CONSULT NOTE  REFERRING PHYSICIAN: Dr. Shelly Coss  REASON FOR CONSULTATION:  85 years old white male with suspicious lung cancer.  HPI [name] [name] is a 85 y.o. male with past medical history significant for multiple medical problems including history of colon cancer many years ago, depression, diabetes mellitus, COPD, and anxiety, hypertension, dyslipidemia, GERD, kidney stones as well as history of restless leg syndrome and lumbar spinal stenosis.  The patient mentioned that he recently returned back to [place] from [place] and he is currently living in a motel until he find a new place.  He was treated a few weeks ago at [facility] as well as [facility] for cellulitis with suspicious necrotizing fasciitis and septic shock requiring intubation.  He has been complaining of shortness of breath and chest pain for around a week.  He also has cough that was getting worse.  He presented to the emergency department [facility] for evaluation.  During his evaluation CT angiogram of the chest was performed and it showed 3.1 cm masslike findings in the left upper lobe along the major fissure with additional nodules within the left lung, both pleural and peribronchovascular.  Was also a bilobed nodule in the lingula measuring 2.5 x 1.8 cm.  There was a small left pleural effusion with subpleural nodular appearance likely malignant.  The scan also showed left hilar and left mediastinal lymphadenopathy including a rounded AP window lymph node measuring 2.7 cm in diameter and new compared to the prior imaging studies.  Patient underwent ultrasound-guided left thoracentesis with drainage of 300 mL of clear red fluid.  Pleural fluid cytology was negative for malignancy. I was consulted to see the patient for further evaluation and recommendation regarding his condition. When seen today the patient is  feeling fine except for the shortness of breath and substernal chest pain.  He denied having any fever or chills.  He has no nausea, vomiting, diarrhea or constipation.  He denied having any headache or visual changes. Family history significant for brother with lung cancer, mother and father had congestive heart failure. The patient is a widow and has 2 sons.  He has a long history of smoking but quit in 1992.  Having any history of alcohol or drug abuse. HPI  Past Medical History:  Diagnosis Date  . ABSCESS 12/03/2009  . ABSCESS, FINGER 04/07/2010  . ANXIETY 11/03/2009  . Asthma   . Colon cancer ([place])   . COPD 11/03/2009   on home O2  . DEPRESSION 11/03/2009  . DIABETES MELLITUS, TYPE II 11/03/2009  . [place] DISEASE, LUMBAR 11/03/2009  . EMPHYSEMA, BULLOUS 11/03/2009  . GERD 11/03/2009  . HYPERLIPIDEMIA 11/03/2009  . HYPERTENSION 11/03/2009  . Kidney stones 01/30/12   "I've had them 7 times; always have passed them"  . On home oxygen therapy    "2L prn" (09/25/2018)  . PEPTIC ULCER DISEASE 11/03/2009  . Pneumonia   . RASH-NONVESICULAR 11/03/2009  . RESTLESS LEG SYNDROME 11/03/2009  . Shortness of breath    "sometimes; at any time"  . SPINAL STENOSIS, LUMBAR 11/03/2009    Past Surgical History:  Procedure Laterality Date  . Colon cancer surgery    . [place]   left  . INGUINAL HERNIA REPAIR  10/2011   left  . IR THORACENTESIS ASP PLEURAL SPACE W/IMG GUIDE  09/25/2018  . ROTATOR CUFF REPAIR  2003  left  . TONSILLECTOMY  1960    Family History  Problem Relation Age of Onset  . Heart disease Father   . Heart disease Mother   . Cancer Brother        lung    Social History Social History   Tobacco Use  . Smoking status: Former Smoker    Packs/day: 1.00    Years: 50.00    Pack years: 50.00    Types: Cigarettes    Last attempt to quit: 08/19/2018    Years since quitting: 0.1  . Smokeless tobacco: Never Used  . Tobacco comment: "stopped smoking 04/21/1991 then  restarted in 2012"  Substance Use Topics  . Alcohol use: Never    Frequency: Never  . Drug use: Not Currently    No Known Allergies  Current Facility-Administered Medications  Medication Dose Route Frequency Provider Last Rate Last Dose  . acetaminophen (TYLENOL) tablet 650 mg  650 mg Oral Q6H PRN Karmen Bongo, MD   650 mg at 09/25/18 1741   Or  . acetaminophen (TYLENOL) suppository 650 mg  650 mg Rectal Q6H PRN Karmen Bongo, MD      . albuterol (PROVENTIL) (2.5 MG/3ML) 0.083% nebulizer solution 2.5 mg  2.5 mg Nebulization Q2H PRN Karmen Bongo, MD      . aspirin chewable tablet 81 mg  81 mg Oral Daily Karmen Bongo, MD   81 mg at 09/26/18 0945  . atorvastatin (LIPITOR) tablet 40 mg  40 mg Oral Daily Karmen Bongo, MD   40 mg at 09/26/18 0945  . docusate sodium (COLACE) capsule 100 mg  100 mg Oral BID Karmen Bongo, MD   100 mg at 09/26/18 0945  . enoxaparin (LOVENOX) injection 40 mg  40 mg Subcutaneous Q24H Karmen Bongo, MD   40 mg at 09/25/18 1741  . ibuprofen (ADVIL,MOTRIN) tablet 600 mg  600 mg Oral Q6H PRN Adhikari, Amrit, MD      . ipratropium-albuterol (DUONEB) 0.5-2.5 (3) MG/3ML nebulizer solution 3 mL  3 mL Nebulization TID Karmen Bongo, MD   3 mL at 09/26/18 0923  . metoprolol tartrate (LOPRESSOR) tablet 50 mg  50 mg Oral BID Karmen Bongo, MD   50 mg at 09/26/18 0945  . mometasone-formoterol (DULERA) 200-5 MCG/ACT inhaler 2 puff  2 puff Inhalation BID Karmen Bongo, MD   2 puff at 09/26/18 [phone]  . morphine 2 MG/ML injection 2 mg  2 mg Intravenous Q2H PRN Karmen Bongo, MD      . nitroGLYCERIN (NITROSTAT) SL tablet 0.4 mg  0.4 mg Sublingual Q5 min PRN Shelly Coss, MD      . ondansetron (ZOFRAN) tablet 4 mg  4 mg Oral Q6H PRN Karmen Bongo, MD       Or  . ondansetron [facility]) injection 4 mg  4 mg Intravenous Q6H PRN Karmen Bongo, MD      . oxyCODONE (Oxy IR/ROXICODONE) immediate release tablet 10 mg  10 mg Oral Q4H PRN Gardiner Barefoot, NP    10 mg at 09/26/18 1100  . PERFLUTREN LIPID MICROSPHERE injection SUSP           . perflutren lipid microspheres (DEFINITY) IV suspension  1-10 mL Intravenous PRN Shelly Coss, MD      . tamsulosin (FLOMAX) capsule 0.4 mg  0.4 mg Oral Daily Karmen Bongo, MD   0.4 mg at 09/26/18 0945    Review of Systems  Constitutional: positive for fatigue Eyes: negative Ears, nose, mouth, throat, and face: negative Respiratory: positive for cough  and dyspnea on exertion Cardiovascular: negative Gastrointestinal: negative Genitourinary:negative Integument/breast: negative Hematologic/lymphatic: negative Musculoskeletal:positive for muscle weakness Neurological: negative Behavioral/Psych: negative Endocrine: negative Allergic/Immunologic: negative  Physical Exam  YTK:ZSWFU, healthy, well nourished, well developed and anxious SKIN: skin color, texture, turgor are normal, no rashes or significant lesions HEAD: Normocephalic, No masses, lesions, tenderness or abnormalities EYES: normal, PERRLA, Conjunctiva are pink and non-injected EARS: External ears normal, Canals clear OROPHARYNX:no exudate, no erythema and lips, buccal mucosa, and tongue normal  NECK: supple, no adenopathy, no JVD LYMPH:  no palpable lymphadenopathy, no hepatosplenomegaly LUNGS: Decreased breath sound as well as wheezes over the left lung. HEART: regular rate & rhythm, no murmurs and no gallops ABDOMEN:abdomen soft, non-tender, normal bowel sounds and no masses or organomegaly BACK: Back symmetric, no curvature., No CVA tenderness EXTREMITIES:no joint deformities, effusion, or inflammation, no edema  NEURO: alert & oriented x 3 with fluent speech, no focal motor/sensory deficits  PERFORMANCE STATUS: ECOG 1  LABORATORY DATA: Lab Results  Component Value Date   WBC 10.9 (H) 09/26/2018   HGB 11.3 (L) 09/26/2018   HCT 35.1 (L) 09/26/2018   MCV 90.5 09/26/2018   PLT 272 09/26/2018    @LASTCHEM @  RADIOGRAPHIC  STUDIES: Dg Chest 1 View  Result Date: 09/25/2018 CLINICAL DATA:  Post left thoracentesis, probable malignancy in the left chest by CT EXAM: CHEST  1 VIEW COMPARISON:  CT chest of 09/25/2018 and chest x-ray of the same day FINDINGS: Opacity at the left lung base again is noted most consistent with effusion and possibly atelectasis. Malignancy is not well seen, better visualized by CT chest. Chronic changes are noted in the lung bases. Cardiomegaly is stable. No acute bony abnormality is seen. IMPRESSION: 1. No pneumothorax after left thoracentesis. 2. Chronic changes at the lung bases. The nodular opacities at the left lung base by CT are not well seen by chest x-ray. 3. Stable cardiomegaly. Electronically Signed   By: Ivar Drape M.D.   On: 09/25/2018 12:01   Dg Chest 2 View  Result Date: 09/25/2018 CLINICAL DATA:  Shortness of breath. EXAM: CHEST - 2 VIEW COMPARISON:  Radiographs 08/26/2018, chest CT 06/26/2018 FINDINGS: Marked emphysema with chronic left lung hyperinflation. Nodular opacity in the left mid lung measures approximately 19 x 22 mm. Unchanged heart size and mediastinal contours with aortic atherosclerosis. Small right pleural effusion and adjacent basilar opacity. No pneumothorax. IMPRESSION: 1. Small right pleural effusion and adjacent basilar opacity. 2. Nodular opacity in the left mid lung measures 19 x 22 mm, suspicious for neoplasm, increased from prior CT. 3. Advanced emphysema.  Emphysema (ICD10-J43.9). Electronically Signed   By: Keith Rake M.D.   On: 09/25/2018 03:07   Ct Angio Chest Pe W And/or Wo Contrast  Result Date: 09/25/2018 CLINICAL DATA:  Intermediate probability for pulmonary embolism. EXAM: CT ANGIOGRAPHY CHEST WITH CONTRAST TECHNIQUE: Multidetector CT imaging of the chest was performed using the standard protocol during bolus administration of intravenous contrast. Multiplanar CT image reconstructions and MIPs were obtained to evaluate the vascular anatomy.  CONTRAST:  17mL ISOVUE-370 IOPAMIDOL (ISOVUE-370) INJECTION 76% COMPARISON:  06/26/2018 FINDINGS: Cardiovascular: Negative for pulmonary embolism. Normal heart size. No pericardial effusion. Aortic and coronary atherosclerosis. Mediastinum/Nodes: Left hilar and left mediastinal lymphadenopathy. A rounded AP window lymph node measures 27 mm in diameter and is new from prior. Lungs/Pleura: 3.1 cm masslike finding in the left upper lobe along the major fissure. There are additional nodules within the left lung, both pleural and peribronchovascular. Although multiple nodules are  seen, there limited to the upper lobe. A bilobed nodule in the lingula measures 25 x 18 mm on series 2, image 102. There is a small left pleural effusion with subpleural nodular appearance, likely malignant. Dependent atelectasis. Bullous emphysema worse on the left. Upper Abdomen: No acute finding. Musculoskeletal: No acute or aggressive finding Review of the MIP images confirms the above findings. IMPRESSION: 1. 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 2. Negative for pulmonary embolism. 3. Bullous emphysema. Electronically Signed   By: Monte Fantasia M.D.   On: 09/25/2018 05:42   Ir Thoracentesis Asp Pleural Space W/img Guide  Result Date: 09/26/2018 INDICATION: Chest pains shortness of breath. Left pleural effusion. Request for diagnostic and therapeutic thoracentesis. EXAM: ULTRASOUND GUIDED LEFT THORACENTESIS MEDICATIONS: 1% lidocaine 10 mL COMPLICATIONS: None immediate. PROCEDURE: An ultrasound guided thoracentesis was thoroughly discussed with the patient and questions answered. The benefits, risks, alternatives and complications were also discussed. The patient understands and wishes to proceed with the procedure. Written consent was obtained. Ultrasound was performed to localize and mark an adequate  pocket of fluid in the left chest. The area was then prepped and draped in the normal sterile fashion. 1% Lidocaine was used for local anesthesia. Under ultrasound guidance a 6 Fr Safe-T-Centesis catheter was introduced. Thoracentesis was performed. The catheter was removed and a dressing applied. FINDINGS: A total of approximately 300 mL of clear red fluid was removed. Samples were sent to the laboratory as requested by the clinical team. IMPRESSION: Successful ultrasound guided left thoracentesis yielding 300 mL of pleural fluid. No pneumothorax on post-procedure chest x-ray. Read by: Gareth Eagle, PA-C Electronically Signed   By: Corrie Mckusick D.O.   On: 09/26/2018 08:45    ASSESSMENT: This is a very pleasant 85 years old white male with suspicious lung cancer presented with upper lobe masslike lesion in addition to multiple nodules including a bilobed nodule in the lingula as well as suspicious left pleural effusion and mediastinal lymphadenopathy.  Inflammatory process and infection cannot be completely excluded taking into consideration his previous history of septic shock in the past few weeks.   PLAN: I had a lengthy discussion with the patient today about his current condition and further investigation to confirm his diagnosis. The ultrasound-guided thoracentesis was performed and the pleural fluid were negative for malignancy. I strongly recommend for the patient to see pulmonary medicine for consideration of bronchoscopy with endobronchial ultrasound and biopsy of the mediastinal lymph nodes as well as the left upper lobe lesion. If the bronchoscopy confirmed malignancy, I will arrange for the patient to come back for follow-up visit with me at the cancer center to complete the staging work-up with a PET scan as well as MRI of the brain to rule out brain metastasis. The patient will continue his current treatment for the suspicious pneumonia and cardiac condition. He agreed to the current  plan.  The patient voices understanding of current disease status and treatment options and is in agreement with the current care plan.  All questions were answered. The patient knows to call the clinic with any problems, questions or concerns. We can certainly see the patient much sooner if necessary.  Thank you so much for allowing me to participate in the care of [name] [name]. I will continue to follow up the patient with you and assist in his care.   Disclaimer: This note was dictated with  voice recognition software. Similar sounding words can inadvertently be transcribed and may not be corrected upon review.   Eilleen Kempf September 26, 2018, 3:30 PM

## 2018-09-27 DIAGNOSIS — R918 Other nonspecific abnormal finding of lung field: Secondary | ICD-10-CM

## 2018-09-27 DIAGNOSIS — E119 Type 2 diabetes mellitus without complications: Secondary | ICD-10-CM

## 2018-09-27 DIAGNOSIS — I10 Essential (primary) hypertension: Secondary | ICD-10-CM

## 2018-09-27 DIAGNOSIS — J9611 Chronic respiratory failure with hypoxia: Secondary | ICD-10-CM

## 2018-09-27 LAB — RESPIRATORY PANEL BY PCR
Adenovirus: NOT DETECTED
Bordetella pertussis: NOT DETECTED
Chlamydophila pneumoniae: NOT DETECTED
Coronavirus 229E: NOT DETECTED
Coronavirus HKU1: NOT DETECTED
Coronavirus NL63: NOT DETECTED
Coronavirus OC43: NOT DETECTED
INFLUENZA A-RVPPCR: NOT DETECTED
Influenza B: NOT DETECTED
Metapneumovirus: NOT DETECTED
Mycoplasma pneumoniae: NOT DETECTED
PARAINFLUENZA VIRUS 4-RVPPCR: NOT DETECTED
Parainfluenza Virus 1: NOT DETECTED
Parainfluenza Virus 2: NOT DETECTED
Parainfluenza Virus 3: NOT DETECTED
RESPIRATORY SYNCYTIAL VIRUS-RVPPCR: NOT DETECTED
Rhinovirus / Enterovirus: NOT DETECTED

## 2018-09-27 LAB — PROCALCITONIN: Procalcitonin: <0.1 ng/mL

## 2018-09-27 LAB — TROPONIN I

## 2018-09-27 MED ORDER — METRONIDAZOLE IN NACL 5-0.79 MG/ML-% IV SOLN
500.0000 mg | Freq: Three times a day (TID) | INTRAVENOUS | Status: DC
  Administered 2018-09-27 – 2018-09-28 (×3): 500 mg via INTRAVENOUS
  Filled 2018-09-27 (×4): qty 100

## 2018-09-27 MED ORDER — SODIUM CHLORIDE 0.9 % IV SOLN
2.0000 g | Freq: Two times a day (BID) | INTRAVENOUS | Status: DC
  Administered 2018-09-27 – 2018-09-28 (×2): 2 g via INTRAVENOUS
  Filled 2018-09-27 (×3): qty 2

## 2018-09-27 MED ORDER — VANCOMYCIN HCL 10 G IV SOLR
1500.0000 mg | Freq: Once | INTRAVENOUS | Status: AC
  Administered 2018-09-27: 1500 mg via INTRAVENOUS
  Filled 2018-09-27: qty 1500

## 2018-09-27 MED ORDER — VANCOMYCIN HCL 10 G IV SOLR
1500.0000 mg | INTRAVENOUS | Status: DC
  Filled 2018-09-27: qty 1500

## 2018-09-27 NOTE — Progress Notes (Signed)
PROGRESS NOTE    [name] [name]  ZSW:[id number] DOB: 1934-01-10 DOA: 09/25/2018 PCP: McLean-Scocuzza, Nino Glow, MD   Brief Narrative: Patient is a 85 year old homeless man with medical history significant for hypertension, hyperlipidemia, diabetes, COPD on home oxygen,  who initially presented to the emergency department with complaint of O2 concentrator not working and requested for arrangement of home oxygen.  He was also complaining of right-sided chest pain, cough and bloody sputum.  Found to be hypoxic off oxygen.  CT chest with contrast on presentation showed 3.1 cm masslike finding in the left upper lobe along the major fissure, additional nodules within the left lung, both pleural and peribronchovascular, small left-sided pleural effusion.  Underwent US guided thoracentesis of  the left sided pleural fluid did not show malignant cells.  He has smoked for last several years.  Oncology consulted for high suspicion of lung cancer.  Oncology recommending bronchoscopic biopsy of the lung mass.  Pulmonary consulted.  Planning for bronchoscopic biopsy tomorrow.  Assessment & Plan:   Principal Problem:   Chronic respiratory failure (HCC) Active Problems:   Essential hypertension   Diabetes mellitus type 2 in nonobese (HCC)   Polysubstance abuse ([place])   Mass of upper lobe of left lung   Mass of left lung  Left upper lobe lung mass: Has history of smoking for last several years.  Presented with chest pain.  US guided thoracentesis of the left side done on 09/26/18 and cytology did not show malignant cells.    Given high likelihood of malignancy,we requested  oncology evaluation for the follow-up and management plan. Oncology evaluated the patient.  Recommended bronchoscopic biopsy of the lung mass.  Pulmonology already consulted and planning for bronchoscopy tomorrow. Oncology will arrange follow-up as an outpatient and further management including  PET scan and MRI of the brain for  staging.  Chronic respiratory insufficiency: History of COPD with home oxygen.  Apparently his oxygen concentrator is not working so he presented here.  He qualified for home oxygen.  Will arrange oxygen on discharge.  Continue bronchodilators as needed. Patient was seen by pulmonology and they have also started on broad-spectrum antibiotics for the suspicion of pneumonia.  Chest imagings did not show any pneumonia.  Chest pain: Very atypical.  Complains of both right-sided and left-sided chest pain.  Not associated with exertion and frankly reproducable.  Negative troponins.  Continue pain management.most likely this is costochondritis. Echocardiogram did not show any wall motion abnormality,showed  normal left ventricular ejection fraction, grade 1 diastolic dysfunction, moderate pulmonary hypertension.  Hypertension: Currently blood pressure stable.  Continue current medications  Diabetes type 2: Continue sliding scale insulin here.  Recent hemoglobin of 7.1 as per 11/7.  On Glucophage at home.  Smoking/polysubstance abuse: Had been smoking for last several years.  He said he quit smoking about a month ago.  Also has history of polysubstance abuse.  Counseled for cessation.  BPH: Continue tamsulosin  Homelessness: Currently living in a motel.Case manager aware and following.          DVT prophylaxis:Lovenox Code Status: Full Family Communication: None present at the bedside Disposition Plan: Home after full work-up(awaiting bronchoscopy)  Consultants: Oncology  Procedures: None  Antimicrobials: None  Subjective: Patient seen and examined the bedside this morning.  Remains hemodynamically stable.  Respiratory status stable.  He is not in any kind of respiratory distress.  Still complains of central chest pain.  His chest pain is reproducible suggesting musculoskeletal etiology.   Objective: Vitals:  09/26/18 2322 09/27/18 0838 09/27/18 0857 09/27/18 0858  BP: 121/67  106/63    Pulse: 73 64    Resp: 20 12    Temp: 98 F (36.7 C) (!) 97.5 F (36.4 C)    TempSrc: Oral Oral    SpO2: 98% 98% 95% 95%  Weight:      Height:       No intake or output data in the 24 hours ending 09/27/18 1353 Filed Weights   09/25/18 0123 09/25/18 1743  Weight: 79.4 kg 78.7 kg    Examination:  General exam: Not in distress,average built, pleasant elderly male HEENT:PERRL,Oral mucosa moist, Ear/Nose normal on gross exam Respiratory system: Bilateral coarse breathing sounds Cardiovascular system: S1 & S2 heard, RRR. No JVD, murmurs, rubs, gallops or clicks. No pedal edema. Gastrointestinal system: Abdomen is nondistended, soft and nontender. No organomegaly or masses felt. Normal bowel sounds heard. Central nervous system: Alert and oriented. No focal neurological deficits. Extremities: No edema, no clubbing ,no cyanosis, distal peripheral pulses palpable. Skin: No rashes, lesions or ulcers,no icterus ,no pallor MSK: Normal muscle bulk,tone ,power Psychiatry: Judgement and insight appear normal. Mood & affect appropriate.     Data Reviewed: I have personally reviewed following labs and imaging studies  CBC: Recent Labs  Lab 09/25/18 0306 09/26/18 1414  WBC 11.9* 10.9*  NEUTROABS 8.8*  --   HGB 11.1* 11.3*  HCT 35.7* 35.1*  MCV 92.7 90.5  PLT 304 742   Basic Metabolic Panel: Recent Labs  Lab 09/25/18 0306 09/26/18 1414  NA 139 137  K 3.7 3.9  CL 103 105  CO2 26 21*  GLUCOSE 97 119*  BUN 23 19  CREATININE 1.11 1.12  CALCIUM 9.3 9.2   GFR: Estimated Creatinine Clearance: 54.2 mL/min (by C-G formula based on SCr of 1.12 mg/dL). Liver Function Tests: Recent Labs  Lab 09/25/18 0306  AST 16  ALT 15  ALKPHOS 67  BILITOT 0.7  PROT 7.0  ALBUMIN 3.3*   No results for input(s): LIPASE, AMYLASE in the last 168 hours. No results for input(s): AMMONIA in the last 168 hours. Coagulation Profile: No results for input(s): INR, PROTIME in the last 168  hours. Cardiac Enzymes: Recent Labs  Lab 09/26/18 1411 09/26/18 2115 09/27/18 0304  TROPONINI <0.03 <0.03 <0.03   BNP (last 3 results) No results for input(s): PROBNP in the last 8760 hours. HbA1C: No results for input(s): HGBA1C in the last 72 hours. CBG: No results for input(s): GLUCAP in the last 168 hours. Lipid Profile: No results for input(s): CHOL, HDL, LDLCALC, TRIG, CHOLHDL, LDLDIRECT in the last 72 hours. Thyroid Function Tests: No results for input(s): TSH, T4TOTAL, FREET4, T3FREE, THYROIDAB in the last 72 hours. Anemia Panel: No results for input(s): VITAMINB12, FOLATE, FERRITIN, TIBC, IRON, RETICCTPCT in the last 72 hours. Sepsis Labs: Recent Labs  Lab 09/27/18 0304  PROCALCITON <0.10    Recent Results (from the past 240 hour(s))  Gram stain     Status: None   Collection Time: 09/25/18 11:47 AM  Result Value Ref Range Status   Specimen Description PLEURAL LEFT  Final   Special Requests NONE  Final   Gram Stain   Final    FEW WBC PRESENT,BOTH PMN AND MONONUCLEAR NO ORGANISMS SEEN Performed at [facility], [address], [place], [place] [address]    Report Status 09/25/2018 FINAL  Final  Culture, body fluid-bottle     Status: None (Preliminary result)   Collection Time: 09/25/18 11:47 AM  Result Value Ref Range Status   Specimen Description PLEURAL LEFT  Final   Special Requests NONE  Final   Culture   Final    NO GROWTH 2 DAYS Performed at [facility], [address] [place], [place] [address]    Report Status PENDING  Incomplete         Radiology Studies: No results found.      Scheduled Meds: . aspirin  81 mg Oral Daily  . atorvastatin  40 mg Oral Daily  . docusate sodium  100 mg Oral BID  . enoxaparin (LOVENOX) injection  40 mg Subcutaneous Q24H  . ipratropium-albuterol  3 mL Nebulization TID  . metoprolol tartrate  50 mg Oral BID  . mometasone-formoterol  2 puff Inhalation BID  . tamsulosin  0.4 mg Oral Daily    Continuous Infusions: . ceFEPime (MAXIPIME) IV    . metronidazole 500 mg (09/27/18 1347)  . vancomycin    . [START ON 09/28/2018] vancomycin       LOS: 1 day    Time spent: 35 mins.More than 50% of that time was spent in counseling and/or coordination of care.      Shelly Coss, MD [facility] Pager [phone]  If 7PM-7AM, please contact night-coverage [email or web address] Password [email or web address] 09/27/2018, 1:53 PM

## 2018-09-27 NOTE — Progress Notes (Signed)
Pharmacy Antibiotic Note  [name] [name] is a 85 y.o. male to begin Vancomycin, Cefepime and Metronidazole for HCAP coverage.  Hx COPD on home O2, LUL lung mass and pleural effusion. Thoracentesis done 09/26/18 yielded 300 ml clear red fluid. No organisms on gram stain.  New blood cultures, sputum culture, urine for Strep and Legionella antigens, respiratory panel and PCT ordered.  Plan:  Cefepime 2 gm IV q12hrs.  Vancomycin 1500 mg IV q24hrs.  Goal AUC 400-550.  Expected AUC: 515  SCr used: 1.12  Metronidazole 500 mg IV q8hrs per MD.  Will follow renal function, culture data and clinical progress.  Vanc levels at steady state if indicated.  Height: 5\' 11"  (180.3 cm) Weight: 173 lb 8 oz (78.7 kg) IBW/kg (Calculated) : 75.3  Temp (24hrs), Avg:97.7 F (36.5 C), Min:97.5 F (36.4 C), Max:98 F (36.7 C)  Recent Labs  Lab 09/25/18 0306 09/26/18 1414  WBC 11.9* 10.9*  CREATININE 1.11 1.12    Estimated Creatinine Clearance: 54.2 mL/min (by C-G formula based on SCr of 1.12 mg/dL).    No Known Allergies  Antimicrobials this admission:   Cefepime 1/9>>   Vancomycin 1/9>>   Metronidazole 1/9>>  Dose adjustments this admission:  n/a  Microbiology results:  1/7 blood x 2 - no growth x 2 days to date  1/7 left pleural fluid - no organisms seen on gram stain  1/9 blood x 2 -   1/9 Strep pneumo antigen -  1/9 Legionella antigen -  1/9 respiratory panel -  1/9 sputum -  Thank you for allowing pharmacy to be a part of this patient's care.  Arty Baumgartner, [place] Pager: [phone] or phone: [phone] 09/27/2018 1:06 PM

## 2018-09-27 NOTE — Consult Note (Addendum)
NAME:  [name] [name], MRN:  [id number], DOB:  Jun 26, 1934, LOS: 1 ADMISSION DATE:  09/25/2018, CONSULTATION DATE:  09/27/2018 REFERRING MD:  Dr. Tawanna Solo, CHIEF COMPLAINT:  Left upper lobe mass  Brief History   85 year old male found to have left upper lobe mass in addition to multiple nodules, including a bilobed nodule in the lingula, mediastinal lymphadenopathy, and left pleural effusion (pleural fluid with negative cytology for malignancy). Oncology consulted and felt that pulmonary should be consulted for further investigation and consideration for EBUS and biopsy.    History of present illness   85 year old male with past medical history significant for but not limited to tobacco abuse, COPD on home O2, diabetes, HTN, dyslipidemia, and GERD admitted by [facility] on 1/7 for hypoxia.   Patient states he recently moved back from [place], actively looking at places to stay but currently living in a motel.  Reports his son lives in [place].   He was hospitalized at [facility] 12/7 transferred to [facility] (12/8- 12/19) for septic shock requiring intubation with cellulitis and concerns for necrotizing fasciitis. He was then discharged to [facility] but left AMA apparently on 1/6.  Went home, however his oxygen concentrator was not working, thus became short of breath and hypoxic presenting to ER by EMS. Additionally in the ER, he complained of right sided chest pain for around 2 weeks and progressive productive cough. He is vague, but reports his sputum is usually clear but at some point changed to green.  Denies fever, chills, bloody sputum (although reported on admit), or dysphagia.  No significant shortness of breath unless he doesn't wear his oxygen.  He reports he unintentionally lost weight, 212 to 157 lbs in 2019 but since has gain back some weight and has normal appetite.   He has smoked since the age of 27 and at his heaviest, smoked 1.5 ppd.  States he quit one month ago.   He is has been afebrile but mild  leukocytosis 10.9 with elevated neutrophils.  During his workup, he was ruled out for pulmonary embolism, but CTA chest showed new left upper lobe mass in addition to multiple nodules, including a bilobed nodule in the lingula, mediastinal lymphadenopathy, and left pleural effusion compared to his prior chest CT chest 06/2018.  Underwent US guided left thoracentesis by IR on 1/8 yielding 300 ml of clear bloody; cytology was negative for malignancy.  Oncology was consulted 1/8 given suspicious chest CT findings with recommendations for further investigation and consideration for EBUS and biopsy by pulmonary.     Past Medical History  Former smoker, COPD on home O2, diabetes, HTN, dyslipidemia, GERD, anxiety, depression, restless leg syndrome, lumbar spinal stenosis, kidney stones, colon cancer (dx 11/2017 in [place] s/p partial colectomy- no chemo or radiation per patient), DNR  Significant Hospital Events   1/7 Admit to [facility]  Consults:  Oncology 1/8  Procedures:  1/8 left US guided thoracentesis- 300 ml of clear red fluid   Significant Diagnostic Tests:  1/7 CTA chest PE >> 3.1 cm masslike finding in the left upper lobe along the major fissure. There are additional nodules within the left lung, both pleural and peribronchovascular. Although multiple nodules are seen, there limited to the upper lobe. A bilobed nodule in the lingula measures 25 x 18 mm on series 2, image 102. There is a small left pleural effusion with subpleural nodular appearance, likely malignant. Dependent atelectasis. Bullous emphysema worse on the left. Impression:  1. Malignant findings in the  left chest, likely primary upper lobe cancer with intralobar spread, ipsilateral adenopathy, and small but malignant appearing left pleural effusion. 2. Negative for pulmonary embolism. 3. Bullous emphysema.  1/8 TTE >> limited study, LVEF 55-60%, RV not well visualized by at least mildly dilated; RV function looks preserved; moderate  PH with PAP 64 mmHg and mild septal flattening suggestive of RV pressure/ volume overload  Micro Data:  Sputum cx >> Blood cultures >> RVP >>  Antimicrobials:  1/9 vancomycin >> 1/9 cefepime >> 1/9 flagyl >>  Interim history/subjective:   Objective   Blood pressure 106/63, pulse 64, temperature (!) 97.5 F (36.4 C), temperature source Oral, resp. rate 12, height 5\' 11"  (1.803 m), weight 78.7 kg, SpO2 95 %.       No intake or output data in the 24 hours ending 09/27/18 1023 Filed Weights   09/25/18 0123 09/25/18 1743  Weight: 79.4 kg 78.7 kg    Examination: General:  Very pleasant 85 year old male sitting on bed in NAD HEENT: MM pink/moist, pupils 3/reactive, anicteric  Neuro: Alert, oriented, non focal, MAE CV:  rrr, no m/r/g PULM: even/non-labored, speaking full sentences, lungs bilaterally diminished, faint scattered wheeze, LLL  and LML posterior rales  GI: soft, non-tender, bs active  Extremities: warm/dry, trace BLE edema, bilateral feet noted to be purple but present palpable pulses- color improved when elevated Skin: no rashes, no axillary or supraclavicalar lymphadenopathy noted   Resolved Hospital Problem list     Assessment & Plan:  Left upper lobe mass 3.1 cm w/ additional to multiple nodules, including a bilobed nodule in the lingula, mediastinal lymphadenopathy, and small left pleural effusion  COPD on home O2/ bullous emphysema L>R  Acute on chronic hypoxic respiratory failure Tobacco abuse - quit 1 month ago - etiology ddx infectious vs inflammatory vs malignant process - given new CT findings since 06/2018 and recent hospitalization -1/8 Left pleural fluid -> 300 ml of clear red; WBC 5637, LDH 243, protein 4.8; cytology negative for malignancy  P:   Supplemental O2 for goal sats 88-94%, baseline 2.5L O2 Will check PCT, urine strep/ legionella, RVP, blood cultures and expectorated sputum sample for now Will cover for HCAP given recent hospitalization/  [facility]- vancomycin, cefepime, and flagyl Approach of bronchoscopy to be determined per Dr. Lake Bells - ENB vs EBUS.  At this time, patient is currently agreeable to proceed.  NPO after midnight  Best practice:  Diet: per primary, NPO after midnight Pain/Anxiety/Delirium protocol (if indicated): n/a  VAP protocol (if indicated): n/a DVT prophylaxis: lovenox  GI prophylaxis: n/a Glucose control: n/a Mobility: ad lib Code Status: DNR/ DNI Family Communication: no family at bedside.  Disposition: floor   Labs   CBC: Recent Labs  Lab 09/25/18 0306 09/26/18 1414  WBC 11.9* 10.9*  NEUTROABS 8.8*  --   HGB 11.1* 11.3*  HCT 35.7* 35.1*  MCV 92.7 90.5  PLT 304 956    Basic Metabolic Panel: Recent Labs  Lab 09/25/18 0306 09/26/18 1414  NA 139 137  K 3.7 3.9  CL 103 105  CO2 26 21*  GLUCOSE 97 119*  BUN 23 19  CREATININE 1.11 1.12  CALCIUM 9.3 9.2   GFR: Estimated Creatinine Clearance: 54.2 mL/min (by C-G formula based on SCr of 1.12 mg/dL). Recent Labs  Lab 09/25/18 0306 09/26/18 1414  WBC 11.9* 10.9*    Liver Function Tests: Recent Labs  Lab 09/25/18 0306  AST 16  ALT 15  ALKPHOS 67  BILITOT 0.7  PROT 7.0  ALBUMIN 3.3*   No results for input(s): LIPASE, AMYLASE in the last 168 hours. No results for input(s): AMMONIA in the last 168 hours.  ABG    Component Value Date/Time   PHART 7.36 08/26/2018 0010   PCO2ART 36 08/26/2018 0010   PO2ART 104 08/26/2018 0010   HCO3 20.3 08/26/2018 0010   TCO2 19.7 03/23/2012 2050   ACIDBASEDEF 4.5 (H) 08/26/2018 0010   O2SAT 97.8 08/26/2018 0010     Coagulation Profile: No results for input(s): INR, PROTIME in the last 168 hours.  Cardiac Enzymes: Recent Labs  Lab 09/26/18 1411 09/26/18 2115 09/27/18 0304  TROPONINI <0.03 <0.03 <0.03    HbA1C: Hemoglobin A1C  Date/Time Value Ref Range Status  04/29/2012 04:09 AM 5.6 4.2 - 6.3 % Final    Comment:    The American Diabetes Association recommends that a  primary goal of therapy should be <7% and that physicians should reevaluate the treatment regimen in patients with HbA1c values consistently >8%.    Hgb A1c MFr Bld  Date/Time Value Ref Range Status  07/26/2018 04:29 AM 7.1 (H) 4.8 - 5.6 % Final    Comment:    (NOTE) Pre diabetes:          5.7%-6.4% Diabetes:              >6.4% Glycemic control for   <7.0% adults with diabetes   12/16/2016 04:13 AM 5.8 (H) 4.8 - 5.6 % Final    Comment:    (NOTE)         Pre-diabetes: 5.7 - 6.4         Diabetes: >6.4         Glycemic control for adults with diabetes: <7.0     CBG: No results for input(s): GLUCAP in the last 168 hours.  Review of Systems:   POSITIVES IN BOLD Gen: Denies fever, chills, weight change, fatigue, night sweats HEENT: Denies sinus congestion, rhinorrhea, sore throat, neck stiffness, dysphagia PULM: Denies shortness of breath, cough, sputum production, hemoptysis, wheezing, pleuritic chest pain CV: Denies chest pain, edema, orthopnea, palpitations GI: Denies abdominal pain, nausea, vomiting, diarrhea, hematochezia, melena, constipation, change in bowel habits GU: Denies dysuria, hematuria, polyuria, Endocrine: Denies hot or cold intolerance, polyuria, polyphagia or appetite change Derm: Denies rash, dry skin Heme: Denies easy bruising, bleeding,  Neuro: Denies headache, numbness, weakness, slurred speech, loss of memory or consciousness  Past Medical History  He,  has a past medical history of ABSCESS (12/03/2009), ABSCESS, FINGER (04/07/2010), ANXIETY (11/03/2009), Asthma, Colon cancer ([place]), COPD (11/03/2009), DEPRESSION (11/03/2009), DIABETES MELLITUS, TYPE II (11/03/2009), DISC DISEASE, LUMBAR (11/03/2009), EMPHYSEMA, BULLOUS (11/03/2009), GERD (11/03/2009), HYPERLIPIDEMIA (11/03/2009), HYPERTENSION (11/03/2009), Kidney stones (01/30/12), On home oxygen therapy, PEPTIC ULCER DISEASE (11/03/2009), Pneumonia, RASH-NONVESICULAR (11/03/2009), RESTLESS LEG SYNDROME (11/03/2009),  Shortness of breath, and SPINAL STENOSIS, LUMBAR (11/03/2009).   Surgical History    Past Surgical History:  Procedure Laterality Date  . Colon cancer surgery    . [place]   left  . INGUINAL HERNIA REPAIR  10/2011   left  . IR THORACENTESIS ASP PLEURAL SPACE W/IMG GUIDE  09/25/2018  . ROTATOR CUFF REPAIR  2003   left  . TONSILLECTOMY  1960     Social History   reports that he quit smoking about 5 weeks ago. His smoking use included cigarettes. He has a 50.00 pack-year smoking history. He has never used smokeless tobacco. He reports previous drug use. He reports that he does not  drink alcohol.   Family History   His family history includes Cancer in his brother; Heart disease in his father and mother.   Allergies No Known Allergies   Home Medications  Prior to Admission medications   Medication Sig Start Date End Date Taking? Authorizing Provider  albuterol (PROVENTIL HFA;VENTOLIN HFA) 108 (90 Base) MCG/ACT inhaler Inhale 2 puffs into the lungs every 6 (six) hours as needed. 07/29/18  Yes Salary, Avel Peace, MD  aspirin 81 MG chewable tablet Chew 1 tablet (81 mg total) by mouth daily. 07/29/18  Yes Salary, Avel Peace, MD  atorvastatin (LIPITOR) 40 MG tablet Take 1 tablet (40 mg total) by mouth daily. 07/29/18  Yes Salary, Avel Peace, MD  Fluticasone-Salmeterol (ADVAIR) 250-50 MCG/DOSE AEPB Inhale 1 puff into the lungs 2 (two) times daily. 07/29/18  Yes Salary, Avel Peace, MD  furosemide (LASIX) 20 MG tablet Take 1 tablet (20 mg total) by mouth daily as needed for fluid or edema. 07/29/18  Yes Salary, Avel Peace, MD  metFORMIN (GLUCOPHAGE) 500 MG tablet Take 1 tablet (500 mg total) by mouth 2 (two) times daily with a meal. 07/29/18  Yes Salary, Montell D, MD  metoprolol tartrate (LOPRESSOR) 50 MG tablet Take 1 tablet (50 mg total) by mouth 2 (two) times daily. 07/29/18  Yes Salary, Avel Peace, MD  tamsulosin (FLOMAX) 0.4 MG CAPS capsule Take 1 capsule (0.4 mg total) by  mouth daily. 07/29/18  Yes Salary, Avel Peace, MD  predniSONE (DELTASONE) 50 MG tablet Take 1 tablet (50 mg total) by mouth daily. 09/19/86   Delora Fuel, MD        Kennieth Rad, MSN, AGACNP-BC [facility] Pgr: [phone] or if no answer [phone] 09/27/2018, 1:03 PM

## 2018-09-28 ENCOUNTER — Encounter (HOSPITAL_COMMUNITY)
Admission: EM | Disposition: A | Discharge status: Home Health | Payer: Self-pay | Source: Home / Self Care | Attending: Family Medicine

## 2018-09-28 ENCOUNTER — Inpatient Hospital Stay (HOSPITAL_COMMUNITY): Payer: Medicare PPO | Admitting: Anesthesiology

## 2018-09-28 ENCOUNTER — Encounter (HOSPITAL_COMMUNITY): Payer: Self-pay | Admitting: Anesthesiology

## 2018-09-28 HISTORY — PX: VIDEO BRONCHOSCOPY WITH ENDOBRONCHIAL ULTRASOUND: SHX6177

## 2018-09-28 LAB — GLUCOSE, CAPILLARY: Glucose-Capillary: 97 mg/dL (ref 70–99)

## 2018-09-28 LAB — MRSA PCR SCREENING: MRSA by PCR: NEGATIVE

## 2018-09-28 LAB — STREP PNEUMONIAE URINARY ANTIGEN: STREP PNEUMO URINARY ANTIGEN: NEGATIVE

## 2018-09-28 LAB — PROCALCITONIN: Procalcitonin: <0.1 ng/mL

## 2018-09-28 SURGERY — BRONCHOSCOPY, WITH EBUS
Anesthesia: General

## 2018-09-28 MED ORDER — ONDANSETRON HCL 4 MG/2ML IJ SOLN: 4.0000 mg | Freq: Once | INTRAMUSCULAR | Status: DC | PRN

## 2018-09-28 MED ORDER — FENTANYL CITRATE (PF) 250 MCG/5ML IJ SOLN
INTRAMUSCULAR | Status: AC
  Filled 2018-09-28: qty 5

## 2018-09-28 MED ORDER — ONDANSETRON HCL 4 MG/2ML IJ SOLN
INTRAMUSCULAR | Status: DC | PRN
  Administered 2018-09-28: 4 mg via INTRAVENOUS

## 2018-09-28 MED ORDER — OXYCODONE HCL 5 MG PO TABS: 5.0000 mg | ORAL_TABLET | Freq: Once | ORAL | Status: DC | PRN

## 2018-09-28 MED ORDER — DEXAMETHASONE SODIUM PHOSPHATE 10 MG/ML IJ SOLN
INTRAMUSCULAR | Status: AC
  Filled 2018-09-28: qty 1

## 2018-09-28 MED ORDER — EPHEDRINE SULFATE-NACL 50-0.9 MG/10ML-% IV SOSY
PREFILLED_SYRINGE | INTRAVENOUS | Status: DC | PRN
  Administered 2018-09-28: 5 mg via INTRAVENOUS
  Administered 2018-09-28: 10 mg via INTRAVENOUS

## 2018-09-28 MED ORDER — FENTANYL CITRATE (PF) 250 MCG/5ML IJ SOLN
INTRAMUSCULAR | Status: DC | PRN
  Administered 2018-09-28 (×2): 50 ug via INTRAVENOUS

## 2018-09-28 MED ORDER — DICLOFENAC SODIUM 1 % TD GEL
2.0000 g | Freq: Four times a day (QID) | TRANSDERMAL | Status: DC
  Administered 2018-09-28 – 2018-10-04 (×11): 2 g via TOPICAL
  Filled 2018-09-28: qty 100

## 2018-09-28 MED ORDER — EPINEPHRINE PF 1 MG/ML IJ SOLN
INTRAMUSCULAR | Status: AC
  Filled 2018-09-28: qty 1

## 2018-09-28 MED ORDER — SODIUM CHLORIDE 0.9 % IV SOLN
INTRAVENOUS | Status: DC | PRN
  Administered 2018-09-28: 70 ug/min via INTRAVENOUS

## 2018-09-28 MED ORDER — LACTATED RINGERS IV SOLN
INTRAVENOUS | Status: DC
  Administered 2018-09-28: 08:00:00 via INTRAVENOUS

## 2018-09-28 MED ORDER — PROPOFOL 10 MG/ML IV BOLUS
INTRAVENOUS | Status: AC
  Filled 2018-09-28: qty 20

## 2018-09-28 MED ORDER — PROPOFOL 10 MG/ML IV BOLUS
INTRAVENOUS | Status: DC | PRN
  Administered 2018-09-28: 100 mg via INTRAVENOUS

## 2018-09-28 MED ORDER — LIDOCAINE 2% (20 MG/ML) 5 ML SYRINGE
INTRAMUSCULAR | Status: AC
  Filled 2018-09-28: qty 5

## 2018-09-28 MED ORDER — OXYCODONE HCL 5 MG/5ML PO SOLN: 5.0000 mg | Freq: Once | ORAL | Status: DC | PRN

## 2018-09-28 MED ORDER — ONDANSETRON HCL 4 MG/2ML IJ SOLN
INTRAMUSCULAR | Status: AC
  Filled 2018-09-28: qty 2

## 2018-09-28 MED ORDER — FENTANYL CITRATE (PF) 100 MCG/2ML IJ SOLN
INTRAMUSCULAR | Status: AC
  Filled 2018-09-28: qty 2

## 2018-09-28 MED ORDER — FENTANYL CITRATE (PF) 100 MCG/2ML IJ SOLN
25.0000 ug | INTRAMUSCULAR | Status: DC | PRN
  Administered 2018-09-28: 25 ug via INTRAVENOUS

## 2018-09-28 MED ORDER — 0.9 % SODIUM CHLORIDE (POUR BTL) OPTIME
TOPICAL | Status: DC | PRN
  Administered 2018-09-28: 1000 mL

## 2018-09-28 MED ORDER — DEXAMETHASONE SODIUM PHOSPHATE 10 MG/ML IJ SOLN
INTRAMUSCULAR | Status: DC | PRN
  Administered 2018-09-28: 10 mg via INTRAVENOUS

## 2018-09-28 MED ORDER — SUGAMMADEX SODIUM 200 MG/2ML IV SOLN
INTRAVENOUS | Status: DC | PRN
  Administered 2018-09-28: 157.4 mg via INTRAVENOUS

## 2018-09-28 MED ORDER — ROCURONIUM BROMIDE 10 MG/ML (PF) SYRINGE
PREFILLED_SYRINGE | INTRAVENOUS | Status: DC | PRN
  Administered 2018-09-28: 50 mg via INTRAVENOUS

## 2018-09-28 MED ORDER — PHENYLEPHRINE 40 MCG/ML (10ML) SYRINGE FOR IV PUSH (FOR BLOOD PRESSURE SUPPORT)
PREFILLED_SYRINGE | INTRAVENOUS | Status: DC | PRN
  Administered 2018-09-28 (×2): 80 ug via INTRAVENOUS
  Administered 2018-09-28 (×2): 120 ug via INTRAVENOUS

## 2018-09-28 MED ORDER — LIDOCAINE 2% (20 MG/ML) 5 ML SYRINGE
INTRAMUSCULAR | Status: DC | PRN
  Administered 2018-09-28: 60 mg via INTRAVENOUS

## 2018-09-28 MED ORDER — PHENYLEPHRINE 40 MCG/ML (10ML) SYRINGE FOR IV PUSH (FOR BLOOD PRESSURE SUPPORT)
PREFILLED_SYRINGE | INTRAVENOUS | Status: AC
  Filled 2018-09-28: qty 10

## 2018-09-28 MED ORDER — ROCURONIUM BROMIDE 50 MG/5ML IV SOSY
PREFILLED_SYRINGE | INTRAVENOUS | Status: AC
  Filled 2018-09-28: qty 5

## 2018-09-28 SURGICAL SUPPLY — 35 items
ADAPTER VALVE BIOPSY EBUS (MISCELLANEOUS) IMPLANT
ADPTR VALVE BIOPSY EBUS (MISCELLANEOUS)
BRUSH CYTOL CELLEBRITY 1.5X140 (MISCELLANEOUS) ×2 IMPLANT
CANISTER SUCT 3000ML PPV (MISCELLANEOUS) ×3 IMPLANT
CONT SPEC 4OZ CLIKSEAL STRL BL (MISCELLANEOUS) ×3 IMPLANT
COVER BACK TABLE 60X90IN (DRAPES) ×3 IMPLANT
COVER DOME SNAP 22 D (MISCELLANEOUS) ×3 IMPLANT
FORCEPS BIOP RJ4 1.8 (CUTTING FORCEPS) IMPLANT
GAUZE SPONGE 4X4 12PLY STRL (GAUZE/BANDAGES/DRESSINGS) ×3 IMPLANT
GLOVE BIO SURGEON STRL SZ8 (GLOVE) ×3 IMPLANT
GLOVE BIOGEL PI IND STRL 6.5 (GLOVE) IMPLANT
GLOVE BIOGEL PI INDICATOR 6.5 (GLOVE) ×2
GLOVE SURG SS PI 6.5 STRL IVOR (GLOVE) ×2 IMPLANT
GOWN STRL REUS W/ TWL LRG LVL3 (GOWN DISPOSABLE) ×1 IMPLANT
GOWN STRL REUS W/TWL LRG LVL3 (GOWN DISPOSABLE) ×3
KIT CLEAN ENDO COMPLIANCE (KITS) ×6 IMPLANT
KIT TURNOVER KIT B (KITS) ×3 IMPLANT
MARKER SKIN DUAL TIP RULER LAB (MISCELLANEOUS) ×3 IMPLANT
NDL ASPIRATION VIZISHOT 19G (NEEDLE) IMPLANT
NDL ASPIRATION VIZISHOT 21G (NEEDLE) ×1 IMPLANT
NEEDLE ASPIRATION VIZISHOT 19G (NEEDLE) IMPLANT
NEEDLE ASPIRATION VIZISHOT 21G (NEEDLE) IMPLANT
NS IRRIG 1000ML POUR BTL (IV SOLUTION) ×3 IMPLANT
OIL SILICONE PENTAX (PARTS (SERVICE/REPAIRS)) ×3 IMPLANT
PAD ARMBOARD 7.5X6 YLW CONV (MISCELLANEOUS) ×6 IMPLANT
SYR 20CC LL (SYRINGE) ×3 IMPLANT
SYR 20ML ECCENTRIC (SYRINGE) ×6 IMPLANT
SYR 5ML LUER SLIP (SYRINGE) ×3 IMPLANT
TOWEL OR 17X24 6PK STRL BLUE (TOWEL DISPOSABLE) ×3 IMPLANT
TRAP SPECIMEN MUCOUS 40CC (MISCELLANEOUS) ×2 IMPLANT
TUBE CONNECTING 20'X1/4 (TUBING) ×2
TUBE CONNECTING 20X1/4 (TUBING) ×4 IMPLANT
VALVE BIOPSY  SINGLE USE (MISCELLANEOUS) ×2
VALVE BIOPSY SINGLE USE (MISCELLANEOUS) ×1 IMPLANT
VALVE SUCTION BRONCHIO DISP (MISCELLANEOUS) ×3 IMPLANT

## 2018-09-28 NOTE — Evaluation (Signed)
Physical Therapy Evaluation Patient Details Name: [name] [name] MRN: [id number] DOB: 06/16/1934 Today's Date: 09/28/2018   History of Present Illness  Patient is a 85 y/o male who presents with SOB, cough and bloody sputum. Also wants more 02 as his 43 concentrator is not working. Noted to have a left upper lobe mass and mediastinal lymphadenopathy s/p bronchoscopy 1/10. PMH includes COPD, DM, HTN, HLD,   Clinical Impression  Patient presents with generalized weakness, deconditioning, balance deficits and impaired mobility s/p above. Tolerated transfers and gait training with Min guard-Min A for balance/safety secondary to stumbling and LOB x3-4. Would benefit from use of RW for stability. Sp02 dropped to 85% on 3L/min 02, able to recover with rest and pursed lip breathing. Pt recently left [facility] AMA earlier this week and has no place to live. Daughter-in-law is supposed to be looking into some place for him next week. Encouraged walking with nursing. Pt fall risk. Will follow acutely to maximize independence and mobility prior to return home.     Follow Up Recommendations Home health PT;Supervision for mobility/OOB(pending he finds somewhere to go)    Equipment Recommendations  Rolling walker with 5" wheels    Recommendations for Other Services       Precautions / Restrictions Precautions Precautions: Fall Precaution Comments: watch 02 Restrictions Weight Bearing Restrictions: No      Mobility  Bed Mobility               General bed mobility comments: up in chair upon PT arrival.   Transfers Overall transfer level: Needs assistance Equipment used: None Transfers: Sit to/from Stand Sit to Stand: Min guard         General transfer comment: Min guard for safety. Stood from chair x1, LOB when backing up to chair catching self on rail.  Ambulation/Gait Ambulation/Gait assistance: Min assist;Min guard Gait Distance (Feet): 200 Feet Assistive device: (rail) Gait  Pattern/deviations: Step-through pattern;Decreased stride length;Staggering left;Staggering right     General Gait Details: Very slow gait with multiple LOB and stumbling into wall at times; reaching for rail for support. Would benefit from RW. Sp02 dropped to 85% on 3L/min 02. 2/4 DOE.  Stairs            Wheelchair Mobility    Modified Rankin (Stroke Patients Only)       Balance Overall balance assessment: Needs assistance Sitting-balance support: Feet supported;No upper extremity supported Sitting balance-Leahy Scale: Good     Standing balance support: During functional activity;No upper extremity supported Standing balance-Leahy Scale: Fair Standing balance comment: Able to stand at sink and wash dentures with close min guard.                              Pertinent Vitals/Pain Pain Assessment: Faces Faces Pain Scale: Hurts little more Pain Location: chest Pain Descriptors / Indicators: Discomfort;Grimacing Pain Intervention(s): Monitored during session    Home Living Family/patient expects to be discharged to:: Unsure                 Additional Comments: Pt recently left [facility] AMA on Monday of this week per report (1/6) and has been living out of his car/hotels. No definitive place to stay after discharge    Prior Function Level of Independence: Independent         Comments: Reports feeling weak recently, as he has been in/out of hospitals the last 2 months. Reports his daugher in law is  trying to look at a place for him on the 15th when the building opens.     Hand Dominance   Dominant Hand: Right    Extremity/Trunk Assessment   Upper Extremity Assessment Upper Extremity Assessment: Defer to OT evaluation    Lower Extremity Assessment Lower Extremity Assessment: Generalized weakness    Cervical / Trunk Assessment Cervical / Trunk Assessment: Kyphotic  Communication   Communication: No difficulties  Cognition  Arousal/Alertness: Awake/alert Behavior During Therapy: WFL for tasks assessed/performed Overall Cognitive Status: Within Functional Limits for tasks assessed                                        General Comments      Exercises     Assessment/Plan    PT Assessment Patient needs continued PT services  PT Problem List Decreased strength;Decreased balance;Pain;Cardiopulmonary status limiting activity;Decreased knowledge of use of DME;Decreased mobility;Decreased activity tolerance;Decreased safety awareness       PT Treatment Interventions Functional mobility training;Balance training;Patient/family education;Gait training;Therapeutic activities;Therapeutic exercise;DME instruction    PT Goals (Current goals can be found in the Care Plan section)  Acute Rehab PT Goals Patient Stated Goal: to get stronger and find somewhere to live PT Goal Formulation: With patient Time For Goal Achievement: 10/12/18 Potential to Achieve Goals: Good    Frequency Min 3X/week   Barriers to discharge   has no where to live    Co-evaluation               AM-PAC PT "6 Clicks" Mobility  Outcome Measure Help needed turning from your back to your side while in a flat bed without using bedrails?: None Help needed moving from lying on your back to sitting on the side of a flat bed without using bedrails?: A Little Help needed moving to and from a bed to a chair (including a wheelchair)?: A Little Help needed standing up from a chair using your arms (e.g., wheelchair or bedside chair)?: None Help needed to walk in hospital room?: A Little Help needed climbing 3-5 steps with a railing? : A Little 6 Click Score: 20    End of Session Equipment Utilized During Treatment: Gait belt Activity Tolerance: Patient tolerated treatment well;Treatment limited secondary to medical complications (Comment)(drop in Sp02) Patient left: in chair;with call bell/phone within reach Nurse  Communication: Mobility status PT Visit Diagnosis: Pain;Difficulty in walking, not elsewhere classified (R26.2);Unsteadiness on feet (R26.81);Muscle weakness (generalized) (M62.81) Pain - part of body: (chest)    Time: 3267-1245 PT Time Calculation (min) (ACUTE ONLY): 28 min   Charges:   PT Evaluation $PT Eval Moderate Complexity: 1 Mod PT Treatments $Gait Training: 8-22 mins        Wray Kearns, PT, DPT Acute Rehabilitation Services Pager [phone] Office [place] 09/28/2018, 4:23 PM

## 2018-09-28 NOTE — Anesthesia Preprocedure Evaluation (Addendum)
Anesthesia Evaluation  Patient identified by MRN, date of birth, ID band Patient awake    Reviewed: Allergy & Precautions, NPO status , Patient's Chart, lab work & pertinent test results  History of Anesthesia Complications Negative for: history of anesthetic complications  Airway Mallampati: II  TM Distance: >3 FB Neck ROM: Full    Dental  (+) Lower Dentures, Upper Dentures   Pulmonary shortness of breath, asthma , COPD,  oxygen dependent, former smoker,   LUL mass    breath sounds clear to auscultation       Cardiovascular hypertension, Pt. on home beta blockers and Pt. on medications +CHF   Rhythm:Regular Rate:Normal   '20 TTE - EF 55% to 60%. Grade 1 diastolic dysfunction. Mild MR. RV and RA cavity sizes were dilated. PA peak pressure: 64 mmHg. There is at least moderate pulmonary HTN with probable mild septal flattening suggestive of RV pressure/volume overload.    Neuro/Psych PSYCHIATRIC DISORDERS Anxiety Depression  Neuromuscular disease (RLS)    GI/Hepatic PUD, GERD  Controlled, Hx cocaine abuse    Endo/Other  diabetes, Type 2, Oral Hypoglycemic Agents  Renal/GU Renal disease (kidney stones)     Musculoskeletal  (+) Arthritis ,   Abdominal   Peds  Hematology  (+) anemia ,   Anesthesia Other Findings   Reproductive/Obstetrics                            Anesthesia Physical Anesthesia Plan  ASA: III  Anesthesia Plan: General   Post-op Pain Management:    Induction: Intravenous  PONV Risk Score and Plan: 2 and Treatment may vary due to age or medical condition, Ondansetron and Propofol infusion  Airway Management Planned: Oral ETT  Additional Equipment: None  Intra-op Plan:   Post-operative Plan: Possible Post-op intubation/ventilation  Informed Consent: I have reviewed the patients History and Physical, chart, labs and discussed the procedure including the risks,  benefits and alternatives for the proposed anesthesia with the patient or authorized representative who has indicated his/her understanding and acceptance.   Dental advisory given  Plan Discussed with: CRNA and Anesthesiologist  Anesthesia Plan Comments: (Discussed DNR with patient. Patient agreeable to invasive airway management, fluids, vasopressors, and CPR as indicated during the perioperative period.)      Anesthesia Quick Evaluation

## 2018-09-28 NOTE — Progress Notes (Signed)
[facility]  I met with the patient and updated him about his diagnosis and discussed next steps for medical oncology follow up.  Roselie Awkward, MD [facility] Pager: [phone] Cell: [phone] If no response, call [phone]

## 2018-09-28 NOTE — Anesthesia Procedure Notes (Signed)
Procedure Name: Intubation Date/Time: 09/28/2018 9:59 AM Performed by: Renato Shin, CRNA Pre-anesthesia Checklist: Patient identified, Emergency Drugs available and Suction available Patient Re-evaluated:Patient Re-evaluated prior to induction Oxygen Delivery Method: Circle system utilized Preoxygenation: Pre-oxygenation with 100% oxygen Induction Type: IV induction Ventilation: Oral airway inserted - appropriate to patient size and Mask ventilation without difficulty Laryngoscope Size: Miller and 3 Grade View: Grade I Tube type: Oral Tube size: 8.0 mm Number of attempts: 1 Airway Equipment and Method: Stylet Placement Confirmation: ETT inserted through vocal cords under direct vision,  positive ETCO2 and breath sounds checked- equal and bilateral Secured at: 21 cm Tube secured with: Tape Dental Injury: Teeth and Oropharynx as per pre-operative assessment

## 2018-09-28 NOTE — Progress Notes (Addendum)
PROGRESS NOTE    [name] [name]  GMW:[id number] DOB: Aug 25, 1934 DOA: 09/25/2018 PCP: McLean-Scocuzza, Nino Glow, MD   Brief Narrative: [name] [name] is a 85 y.o. man with medical history significant for hypertension, hyperlipidemia, diabetes, COPD on home oxygen. He presented secondary to requesting help with home oxygen, found to have a lung mass found to be secondary to [facility] lung caner.   Assessment & Plan:   Principal Problem:   Chronic respiratory failure (HCC) Active Problems:   Essential hypertension   Diabetes mellitus type 2 in nonobese (HCC)   Polysubstance abuse ([place])   Mass of upper lobe of left lung   Mass of left lung   Chronic respiratory failure with hypoxia Initial concern for possible pneumonia. procalcitonin undetectable. Patient was started on empiric vancomycin, flagyl and cefepime. Antibiotics discontinued in setting of undetectable procalcitonin and low suspicion for infection. -Oxygen therapy as needed -PT recommending HHPT  COPD -Continue Duoneb, Dulera  Lung mass Per discussion with pulmonology, patient with non-small cell carcinoma. Patient has quit smoking within the last couple of months. -Medical oncology consulted  Diabetes mellitus, type 2 On metformin as an outpatient  Essential hypertension Stable -Continue metoprolol  BPH -Continue tamsulosin  Homelessness Social work consulted.  Chest pain No mention of rib fracture on chest x-ray or CT scan. Reproducible. Likely costochondritis. Some mild improvement with analgesics and Voltaren gel -Voltaren gel    DVT prophylaxis: Lovenox Code Status:   Code Status: DNR Family Communication: None at bedside Disposition Plan: Discharge pending determination of safe discharge plan   Consultants:   Pulmonology  Procedures:   Bronchoscopy (1/10)  Antimicrobials:  Vancomycin  Cefepime  Flagyl    Subjective: Still has some right sided chest pain. Otherwise, no  issues.  Objective: Vitals:   09/28/18 2121 09/28/18 2259 09/29/18 0728 09/29/18 0841  BP:  110/66 (!) 118/58   Pulse: 85 79 81   Resp: 20 16 18    Temp:  98 F (36.7 C) 97.9 F (36.6 C)   TempSrc:  Oral Oral   SpO2: 93% 96% 98% 98%  Weight:      Height:        Intake/Output Summary (Last 24 hours) at 09/29/2018 1008 Last data filed at 09/28/2018 1800 Gross per 24 hour  Intake 372.83 ml  Output 10 ml  Net 362.83 ml   Filed Weights   09/25/18 0123 09/25/18 1743 09/28/18 0821  Weight: 79.4 kg 78.7 kg 78.7 kg    Examination:  General: Well appearing, no distress Respiratory: diminished bilaterally    Data Reviewed: I have personally reviewed following labs and imaging studies  CBC: Recent Labs  Lab 09/25/18 0306 09/26/18 1414  WBC 11.9* 10.9*  NEUTROABS 8.8*  --   HGB 11.1* 11.3*  HCT 35.7* 35.1*  MCV 92.7 90.5  PLT 304 440   Basic Metabolic Panel: Recent Labs  Lab 09/25/18 0306 09/26/18 1414 09/29/18 0205  NA 139 137 136  K 3.7 3.9 4.2  CL 103 105 102  CO2 26 21* 24  GLUCOSE 97 119* 150*  BUN 23 19 27*  CREATININE 1.11 1.12 1.02  CALCIUM 9.3 9.2 9.2   GFR: Estimated Creatinine Clearance: 59.5 mL/min (by C-G formula based on SCr of 1.02 mg/dL). Liver Function Tests: Recent Labs  Lab 09/25/18 0306  AST 16  ALT 15  ALKPHOS 67  BILITOT 0.7  PROT 7.0  ALBUMIN 3.3*   No results for input(s): LIPASE, AMYLASE in the last 168 hours. No results  for input(s): AMMONIA in the last 168 hours. Coagulation Profile: No results for input(s): INR, PROTIME in the last 168 hours. Cardiac Enzymes: Recent Labs  Lab 09/26/18 1411 09/26/18 2115 09/27/18 0304  TROPONINI <0.03 <0.03 <0.03   BNP (last 3 results) No results for input(s): PROBNP in the last 8760 hours. HbA1C: No results for input(s): HGBA1C in the last 72 hours. CBG: Recent Labs  Lab 09/28/18 1107  GLUCAP 97   Lipid Profile: No results for input(s): CHOL, HDL, LDLCALC, TRIG, CHOLHDL,  LDLDIRECT in the last 72 hours. Thyroid Function Tests: No results for input(s): TSH, T4TOTAL, FREET4, T3FREE, THYROIDAB in the last 72 hours. Anemia Panel: No results for input(s): VITAMINB12, FOLATE, FERRITIN, TIBC, IRON, RETICCTPCT in the last 72 hours. Sepsis Labs: Recent Labs  Lab 09/27/18 0304 09/28/18 0338  PROCALCITON <0.10 <0.10    Recent Results (from the past 240 hour(s))  Gram stain     Status: None   Collection Time: 09/25/18 11:47 AM  Result Value Ref Range Status   Specimen Description PLEURAL LEFT  Final   Special Requests NONE  Final   Gram Stain   Final    FEW WBC PRESENT,BOTH PMN AND MONONUCLEAR NO ORGANISMS SEEN Performed at [facility], [address] [place], [place] [address]    Report Status 09/25/2018 FINAL  Final  Culture, body fluid-bottle     Status: None (Preliminary result)   Collection Time: 09/25/18 11:47 AM  Result Value Ref Range Status   Specimen Description PLEURAL LEFT  Final   Special Requests NONE  Final   Culture   Final    NO GROWTH 4 DAYS Performed at [place] [address], [place], [place] [address]    Report Status PENDING  Incomplete  Culture, blood (routine x 2)     Status: None (Preliminary result)   Collection Time: 09/27/18  1:38 PM  Result Value Ref Range Status   Specimen Description BLOOD LEFT ANTECUBITAL  Final   Special Requests AEROBIC BOTTLE ONLY Blood Culture adequate volume  Final   Culture   Final    NO GROWTH 2 DAYS Performed at [facility], [place] [address] [place], [place] [address]    Report Status PENDING  Incomplete  Culture, blood (routine x 2)     Status: None (Preliminary result)   Collection Time: 09/27/18  1:38 PM  Result Value Ref Range Status   Specimen Description BLOOD LEFT HAND  Final   Special Requests AEROBIC BOTTLE ONLY Blood Culture adequate volume  Final   Culture   Final    NO GROWTH 2 DAYS Performed at [facility], [place] [address], [place], [place]  [address]    Report Status PENDING  Incomplete  Respiratory Panel by PCR     Status: None   Collection Time: 09/27/18  4:05 PM  Result Value Ref Range Status   Adenovirus NOT DETECTED NOT DETECTED Final   Coronavirus 229E NOT DETECTED NOT DETECTED Final   Coronavirus HKU1 NOT DETECTED NOT DETECTED Final   Coronavirus NL63 NOT DETECTED NOT DETECTED Final   Coronavirus OC43 NOT DETECTED NOT DETECTED Final   Metapneumovirus NOT DETECTED NOT DETECTED Final   Rhinovirus / Enterovirus NOT DETECTED NOT DETECTED Final   Influenza A NOT DETECTED NOT DETECTED Final   Influenza B NOT DETECTED NOT DETECTED Final   Parainfluenza Virus 1 NOT DETECTED NOT DETECTED Final   Parainfluenza Virus 2 NOT DETECTED NOT DETECTED Final  Parainfluenza Virus 3 NOT DETECTED NOT DETECTED Final   Parainfluenza Virus 4 NOT DETECTED NOT DETECTED Final   Respiratory Syncytial Virus NOT DETECTED NOT DETECTED Final   Bordetella pertussis NOT DETECTED NOT DETECTED Final   Chlamydophila pneumoniae NOT DETECTED NOT DETECTED Final   Mycoplasma pneumoniae NOT DETECTED NOT DETECTED Final    Comment: Performed at [facility], [place] [address], [place], [place] [address]  MRSA PCR Screening     Status: None   Collection Time: 09/28/18  7:51 AM  Result Value Ref Range Status   MRSA by PCR NEGATIVE NEGATIVE Final    Comment:        The GeneXpert MRSA Assay (FDA approved for NASAL specimens only), is one component of a comprehensive MRSA colonization surveillance program. It is not intended to diagnose MRSA infection nor to guide or monitor treatment for MRSA infections. Performed at [facility], [place] [address] [place], [place] [address]   Culture, respiratory     Status: None (Preliminary result)   Collection Time: 09/28/18 10:42 AM  Result Value Ref Range Status   Specimen Description BRONCHIAL ALVEOLAR LAVAGE  Final   Special Requests NONE  Final   Gram Stain   Final    RARE WBC PRESENT, PREDOMINANTLY  PMN NO ORGANISMS SEEN Performed at [facility], [place] [address] [place], [place] [address]    Culture NO GROWTH < 24 HOURS  Final   Report Status PENDING  Incomplete         Radiology Studies: No results found.      Scheduled Meds: . atorvastatin  40 mg Oral Daily  . diclofenac sodium  2 g Topical QID  . docusate sodium  100 mg Oral BID  . ipratropium-albuterol  3 mL Nebulization TID  . metoprolol tartrate  50 mg Oral BID  . mometasone-formoterol  2 puff Inhalation BID  . tamsulosin  0.4 mg Oral Daily   Continuous Infusions: . lactated ringers 10 mL/hr at 09/28/18 1157     LOS: 3 days     Cordelia Poche, MD [facility] 09/29/2018, 10:08 AM  If 7PM-7AM, please contact night-coverage [email or web address]

## 2018-09-28 NOTE — Progress Notes (Signed)
PROGRESS NOTE    [name] [name]  WJX:[id number] DOB: [birth year] DOA: 09/25/2018 PCP: McLean-Scocuzza, Nino Glow, MD   Brief Narrative: [name] [name] is a 85 y.o. man with medical history significant for hypertension, hyperlipidemia, diabetes, COPD on home oxygen. He presented secondary to requesting help with home oxygen, found to have a lung mass found to be secondary to [facility] lung caner.   Assessment & Plan:   Principal Problem:   Chronic respiratory failure (HCC) Active Problems:   Essential hypertension   Diabetes mellitus type 2 in nonobese (HCC)   Polysubstance abuse ([place])   Mass of upper lobe of left lung   Mass of left lung   Chronic respiratory failure with hypoxia Initial concern for possible pneumonia. procalcitonin undetectable. Patient was started on empiric vancomycin, flagyl and cefepime -Discontinue antibiotics -Watch off of antiobiotics -Oxygen therapy as needed  COPD -Continue Duoneb, Dulera  Lung mass Per discussion with pulmonology, patient with non-small cell carcinoma. Patient has quit smoking within the last couple of months. -Will consult medical oncology, but will likely need to follow up as an outpatient  Diabetes mellitus, type 2 On metformin as an outpatient  Essential hypertension Stable -Continue metoprolol  BPH -Continue tamsulosin  Homelessness Social work consulted.  Chest pain No mention of rib fracture on chest x-ray or CT scan. Reproducible. Likely costochondritis. -Voltaren gel    DVT prophylaxis: Lovenox Code Status:   Code Status: DNR Family Communication: None at bedside Disposition Plan: Discharge likely in 24 hours   Consultants:   Pulmonology  Procedures:   Bronchoscopy (1/10)  Antimicrobials:  Vancomycin  Cefepime  Flagyl    Subjective: Patient reports some sharp chest pain that is worse with deep inhalation and exhalation.  Objective: Vitals:   09/28/18 1130 09/28/18 1145 09/28/18 1433 09/28/18  1734  BP: (!) 97/52 (!) 100/57  116/72  Pulse: 72 68 72 80  Resp:   16 18  Temp:    97.8 F (36.6 C)  TempSrc:    Oral  SpO2: 93% 93% 94% 92%  Weight:      Height:        Intake/Output Summary (Last 24 hours) at 09/28/2018 1958 Last data filed at 09/28/2018 1800 Gross per 24 hour  Intake 572.83 ml  Output 10 ml  Net 562.83 ml   Filed Weights   09/25/18 0123 09/25/18 1743 09/28/18 0821  Weight: 79.4 kg 78.7 kg 78.7 kg    Examination:  General exam: Appears calm and comfortable Respiratory system: Diminished. Respiratory effort normal. Reproducible right sternal/rib pain Cardiovascular system: S1 & S2 heard, RRR. No murmurs, rubs, gallops or clicks. Gastrointestinal system: Abdomen is nondistended, soft and nontender. No organomegaly or masses felt. Normal bowel sounds heard. Central nervous system: Alert and oriented. No focal neurological deficits. Extremities: No edema. No calf tenderness Skin: No cyanosis. No rashes Psychiatry: Judgement and insight appear normal. Mood & affect appropriate.     Data Reviewed: I have personally reviewed following labs and imaging studies  CBC: Recent Labs  Lab 09/25/18 0306 09/26/18 1414  WBC 11.9* 10.9*  NEUTROABS 8.8*  --   HGB 11.1* 11.3*  HCT 35.7* 35.1*  MCV 92.7 90.5  PLT 304 213   Basic Metabolic Panel: Recent Labs  Lab 09/25/18 0306 09/26/18 1414  NA 139 137  K 3.7 3.9  CL 103 105  CO2 26 21*  GLUCOSE 97 119*  BUN 23 19  CREATININE 1.11 1.12  CALCIUM 9.3 9.2   GFR: Estimated Creatinine  Clearance: 54.2 mL/min (by C-G formula based on SCr of 1.12 mg/dL). Liver Function Tests: Recent Labs  Lab 09/25/18 0306  AST 16  ALT 15  ALKPHOS 67  BILITOT 0.7  PROT 7.0  ALBUMIN 3.3*   No results for input(s): LIPASE, AMYLASE in the last 168 hours. No results for input(s): AMMONIA in the last 168 hours. Coagulation Profile: No results for input(s): INR, PROTIME in the last 168 hours. Cardiac Enzymes: Recent  Labs  Lab 09/26/18 1411 09/26/18 2115 09/27/18 0304  TROPONINI <0.03 <0.03 <0.03   BNP (last 3 results) No results for input(s): PROBNP in the last 8760 hours. HbA1C: No results for input(s): HGBA1C in the last 72 hours. CBG: Recent Labs  Lab 09/28/18 1107  GLUCAP 97   Lipid Profile: No results for input(s): CHOL, HDL, LDLCALC, TRIG, CHOLHDL, LDLDIRECT in the last 72 hours. Thyroid Function Tests: No results for input(s): TSH, T4TOTAL, FREET4, T3FREE, THYROIDAB in the last 72 hours. Anemia Panel: No results for input(s): VITAMINB12, FOLATE, FERRITIN, TIBC, IRON, RETICCTPCT in the last 72 hours. Sepsis Labs: Recent Labs  Lab 09/27/18 0304 09/28/18 0338  PROCALCITON <0.10 <0.10    Recent Results (from the past 240 hour(s))  Gram stain     Status: None   Collection Time: 09/25/18 11:47 AM  Result Value Ref Range Status   Specimen Description PLEURAL LEFT  Final   Special Requests NONE  Final   Gram Stain   Final    FEW WBC PRESENT,BOTH PMN AND MONONUCLEAR NO ORGANISMS SEEN Performed at [facility], [address], [place], [place] [address]    Report Status 09/25/2018 FINAL  Final  Culture, body fluid-bottle     Status: None (Preliminary result)   Collection Time: 09/25/18 11:47 AM  Result Value Ref Range Status   Specimen Description PLEURAL LEFT  Final   Special Requests NONE  Final   Culture   Final    NO GROWTH 3 DAYS Performed at [place] [address], [place], [place] [address]    Report Status PENDING  Incomplete  Culture, blood (routine x 2)     Status: None (Preliminary result)   Collection Time: 09/27/18  1:38 PM  Result Value Ref Range Status   Specimen Description BLOOD LEFT ANTECUBITAL  Final   Special Requests AEROBIC BOTTLE ONLY Blood Culture adequate volume  Final   Culture   Final    NO GROWTH < 24 HOURS Performed at [facility], [place] [address] [place], [place] [address]    Report Status PENDING  Incomplete    Culture, blood (routine x 2)     Status: None (Preliminary result)   Collection Time: 09/27/18  1:38 PM  Result Value Ref Range Status   Specimen Description BLOOD LEFT HAND  Final   Special Requests AEROBIC BOTTLE ONLY Blood Culture adequate volume  Final   Culture   Final    NO GROWTH < 24 HOURS Performed at [facility], [place] [address], [place], [place] [address]    Report Status PENDING  Incomplete  Respiratory Panel by PCR     Status: None   Collection Time: 09/27/18  4:05 PM  Result Value Ref Range Status   Adenovirus NOT DETECTED NOT DETECTED Final   Coronavirus 229E NOT DETECTED NOT DETECTED Final   Coronavirus HKU1 NOT DETECTED NOT DETECTED Final   Coronavirus NL63 NOT DETECTED NOT DETECTED Final   Coronavirus OC43 NOT DETECTED NOT DETECTED Final  Metapneumovirus NOT DETECTED NOT DETECTED Final   Rhinovirus / Enterovirus NOT DETECTED NOT DETECTED Final   Influenza A NOT DETECTED NOT DETECTED Final   Influenza B NOT DETECTED NOT DETECTED Final   Parainfluenza Virus 1 NOT DETECTED NOT DETECTED Final   Parainfluenza Virus 2 NOT DETECTED NOT DETECTED Final   Parainfluenza Virus 3 NOT DETECTED NOT DETECTED Final   Parainfluenza Virus 4 NOT DETECTED NOT DETECTED Final   Respiratory Syncytial Virus NOT DETECTED NOT DETECTED Final   Bordetella pertussis NOT DETECTED NOT DETECTED Final   Chlamydophila pneumoniae NOT DETECTED NOT DETECTED Final   Mycoplasma pneumoniae NOT DETECTED NOT DETECTED Final    Comment: Performed at [facility], [place] [address] [place], [place] [address]  MRSA PCR Screening     Status: None   Collection Time: 09/28/18  7:51 AM  Result Value Ref Range Status   MRSA by PCR NEGATIVE NEGATIVE Final    Comment:        The GeneXpert MRSA Assay (FDA approved for NASAL specimens only), is one component of a comprehensive MRSA colonization surveillance program. It is not intended to diagnose MRSA infection nor to guide or monitor treatment  for MRSA infections. Performed at [facility], [place] [address], [place], [place] [address]   Culture, respiratory     Status: None (Preliminary result)   Collection Time: 09/28/18 10:42 AM  Result Value Ref Range Status   Specimen Description BRONCHIAL ALVEOLAR LAVAGE  Final   Special Requests NONE  Final   Gram Stain   Final    RARE WBC PRESENT, PREDOMINANTLY PMN NO ORGANISMS SEEN Performed at [facility], [place] [address], [place], [place] [address]    Culture PENDING  Incomplete   Report Status PENDING  Incomplete         Radiology Studies: No results found.      Scheduled Meds: . atorvastatin  40 mg Oral Daily  . diclofenac sodium  2 g Topical QID  . docusate sodium  100 mg Oral BID  . fentaNYL      . ipratropium-albuterol  3 mL Nebulization TID  . metoprolol tartrate  50 mg Oral BID  . mometasone-formoterol  2 puff Inhalation BID  . tamsulosin  0.4 mg Oral Daily   Continuous Infusions: . lactated ringers 10 mL/hr at 09/28/18 7893     LOS: 2 days     Cordelia Poche, MD [facility] 09/28/2018, 7:58 PM  If 7PM-7AM, please contact night-coverage [email or web address]

## 2018-09-28 NOTE — Op Note (Signed)
Video Bronchoscopy with Endobronchial Ultrasound Procedure Note  Date of Operation: 09/28/2018  Pre-op Diagnosis: lung mass, LUL lesion  Post-op Diagnosis: likely non-small cell lung cancer  Surgeon: Roselie Awkward   Assistants:   Anesthesia: General endotracheal anesthesia  Operation: Flexible video fiberoptic bronchoscopy with endobronchial ultrasound and biopsies.  Estimated Blood Loss: Minimal  Complications: none immediate  Indications and History: [name] [name] is a 85 y.o. male with a new left upper lobe mass and mediastinal lymphadenopathy.  The risks, benefits, complications, treatment options and expected outcomes were discussed with the patient.  The possibilities of pneumothorax, pneumonia, reaction to medication, pulmonary aspiration, perforation of a viscus, bleeding, failure to diagnose a condition and creating a complication requiring transfusion or operation were discussed with the patient who freely signed the consent.    Description of Procedure: The patient was examined in the preoperative area and history and data from the preprocedure consultation were reviewed. It was deemed appropriate to proceed.  The patient was taken to OR 12 at [facility], identified as [name] [name] and the procedure verified as Flexible Video Fiberoptic Bronchoscopy.  A Time Out was held and the above information confirmed. After being taken to the operating room general anesthesia was initiated and the patient  was orally intubated. The video fiberoptic bronchoscope was introduced via the endotracheal tube and a general inspection was performed which showed airway thickening in the left upper lobe, some irregularity. The standard scope was then withdrawn and the endobronchial ultrasound was used to identify and characterize the peritracheal, hilar and bronchial lymph nodes. Inspection showed no lymphadenopathy on the right or at the subcarinal lesion, but the 4L lymph node was enlarged.   I attempted to visualize the 11L lymph node but it was not visible. Using real-time ultrasound guidance Wang needle biopsies were take from Station 4L nodes and were sent for cytology. Then a BAL was collected from the left upper lobe and brushings were performed of the irregular mucosa in the left upper lobe.  The patient tolerated the procedure well without apparent complications. There was no significant blood loss. The bronchoscope was withdrawn. Anesthesia was reversed and the patient was taken to the PACU for recovery.   Samples: 1. Wang needle biopsies from 4L node 2. BAL left upper lobe 3. Endobronchial brushing from the left upper lobe   Plans:  The patient will be discharged from the PACU to home when recovered from anesthesia. We will review the cytology, pathology and microbiology results with the patient when they become available. Outpatient followup will be with Medical Oncology.    Roselie Awkward, MD [facility] Pager: [phone] Cell: [phone] If no response, call [phone]

## 2018-09-28 NOTE — Progress Notes (Signed)
PT Cancellation Note  Patient Details Name: [name] [name] MRN: [id number] DOB: 14-Jun-1934   Cancelled Treatment:    Reason Eval/Treat Not Completed: Patient at procedure or test/unavailable Pt off floor getting bronchoscopic biopsy. Will follow.   Marguarite Arbour A Arabia Nylund 09/28/2018, 9:43 AM Wray Kearns, PT, DPT Acute Rehabilitation Services Pager [phone] Office [phone]

## 2018-09-28 NOTE — H&P (Signed)
[facility] PCCM  HPI: [name] was admitted to our facility for evaluation of pain in his chest and dyspnea. He is a former smoker (recently quit) who had been evaluated and managed for cellulitis with septic shock at [facility] recently.  He was released to [facility] but left there AMA a few weeks ago.  He was noted to have a left upper lobe mass and mediastinal lymphadenopathy.  He is here now for an EBUS guided FNA of the lymphadenopathy.  Past Medical History:  Diagnosis Date  . ABSCESS 12/03/2009  . ABSCESS, FINGER 04/07/2010  . ANXIETY 11/03/2009  . Asthma   . Colon cancer ([place])   . COPD 11/03/2009   on home O2  . DEPRESSION 11/03/2009  . DIABETES MELLITUS, TYPE II 11/03/2009  . [place] DISEASE, LUMBAR 11/03/2009  . EMPHYSEMA, BULLOUS 11/03/2009  . GERD 11/03/2009  . HYPERLIPIDEMIA 11/03/2009  . HYPERTENSION 11/03/2009  . Kidney stones 01/30/12   "I've had them 7 times; always have passed them"  . On home oxygen therapy    "2L prn" (09/25/2018)  . PEPTIC ULCER DISEASE 11/03/2009  . Pneumonia   . RASH-NONVESICULAR 11/03/2009  . RESTLESS LEG SYNDROME 11/03/2009  . Shortness of breath    "sometimes; at any time"  . SPINAL STENOSIS, LUMBAR 11/03/2009     Family History  Problem Relation Age of Onset  . Heart disease Father   . Heart disease Mother   . Cancer Brother        lung     Social History   Socioeconomic History  . Marital status: Widowed    Spouse name: Not on file  . Number of children: 2  . Years of education: Not on file  . Highest education level: Not on file  Occupational History    Employer: RETIRED  Social Needs  . Financial resource strain: Patient refused  . Food insecurity:    Worry: Patient refused    Inability: Patient refused  . Transportation needs:    Medical: Patient refused    Non-medical: Patient refused  Tobacco Use  . Smoking status: Former Smoker    Packs/day: 1.00    Years: 50.00    Pack years: 50.00    Types: Cigarettes    Last attempt to quit:  08/19/2018    Years since quitting: 0.1  . Smokeless tobacco: Never Used  . Tobacco comment: "stopped smoking 04/21/1991 then restarted in 2012"  Substance and Sexual Activity  . Alcohol use: Never    Frequency: Never  . Drug use: Not Currently  . Sexual activity: Not Currently    Birth control/protection: None  Lifestyle  . Physical activity:    Days per week: Patient refused    Minutes per session: Patient refused  . Stress: Patient refused  Relationships  . Social connections:    Talks on phone: Patient refused    Gets together: Patient refused    Attends religious service: Patient refused    Active member of club or organization: Patient refused    Attends meetings of clubs or organizations: Patient refused    Relationship status: Patient refused  . Intimate partner violence:    Fear of current or ex partner: Patient refused    Emotionally abused: Patient refused    Physically abused: Patient refused    Forced sexual activity: Patient refused  Other Topics Concern  . Not on file  Social History Narrative  . Not on file     No Known Allergies   @encmedstart @  Vitals:   09/27/18 2120 09/27/18 2309 09/28/18 0820 09/28/18 0821  BP:  113/63 129/65   Pulse: 75 73 70   Resp: 16 10    Temp:  (!) 97.5 F (36.4 C)    TempSrc:  Oral    SpO2: 96% 99%    Weight:    78.7 kg  Height:    5\' 11"  (1.803 m)   General:  Resting comfortably in bed HENT: NCAT OP clear PULM: wheezing bilaterally, normal effort CV: RRR, no mgr GI: BS+, soft, nontender MSK: normal bulk and tone Neuro: awake, alert, no distress, MAEW  CBC    Component Value Date/Time   WBC 10.9 (H) 09/26/2018 1414   RBC 3.88 (L) 09/26/2018 1414   HGB 11.3 (L) 09/26/2018 1414   HGB 15.9 05/24/2012 1147   HCT 35.1 (L) 09/26/2018 1414   HCT 47.0 05/24/2012 1147   PLT 272 09/26/2018 1414   PLT 237 05/24/2012 1147   MCV 90.5 09/26/2018 1414   MCV 90 05/24/2012 1147   MCH 29.1 09/26/2018 1414   MCHC 32.2  09/26/2018 1414   RDW 15.1 09/26/2018 1414   RDW 13.9 05/24/2012 1147   LYMPHSABS 1.8 09/25/2018 0306   LYMPHSABS 1.0 05/24/2012 1147   MONOABS 0.7 09/25/2018 0306   MONOABS 0.2 05/24/2012 1147   EOSABS 0.5 09/25/2018 0306   EOSABS 0.2 05/24/2012 1147   BASOSABS 0.1 09/25/2018 0306   BASOSABS 0.0 05/24/2012 1147    BMET    Component Value Date/Time   NA 137 09/26/2018 1414   NA 141 05/25/2012 0508   K 3.9 09/26/2018 1414   K 4.2 05/25/2012 0508   CL 105 09/26/2018 1414   CL 108 (H) 05/25/2012 0508   CO2 21 (L) 09/26/2018 1414   CO2 25 05/25/2012 0508   GLUCOSE 119 (H) 09/26/2018 1414   GLUCOSE 80 05/25/2012 0508   BUN 19 09/26/2018 1414   BUN 13 05/25/2012 0508   CREATININE 1.12 09/26/2018 1414   CREATININE 0.95 05/25/2012 0508   CALCIUM 9.2 09/26/2018 1414   CALCIUM 8.9 05/25/2012 0508   GFRNONAA >60 09/26/2018 1414   GFRNONAA >60 05/25/2012 0508   GFRAA >60 09/26/2018 1414   GFRAA >60 05/25/2012 4627    Impression/Plan:  Lung mass with mediastinal lymphadenopathy: plan EBUS guided FNA today  Roselie Awkward, MD [place] [facility] Pager: [phone] Cell: [phone] If no response, call [phone]

## 2018-09-28 NOTE — Transfer of Care (Signed)
Immediate Anesthesia Transfer of Care Note  Patient: [name] [name]  Procedure(s) Performed: VIDEO BRONCHOSCOPY WITH ENDOBRONCHIAL ULTRASOUND (N/A )  Patient Location: PACU  Anesthesia Type:General  Level of Consciousness: awake, alert  and patient cooperative  Airway & Oxygen Therapy: Patient Spontanous Breathing and Patient connected to face mask oxygen  Post-op Assessment: Report given to RN and Post -op Vital signs reviewed and stable  Post vital signs: Reviewed and stable  Last Vitals:  Vitals Value Taken Time  BP 98/51 09/28/2018 11:00 AM  Temp 36.4 C 09/28/2018 11:00 AM  Pulse 74 09/28/2018 11:00 AM  Resp 15 09/28/2018 11:00 AM  SpO2 97 % 09/28/2018 11:00 AM    Last Pain:  Vitals:   09/28/18 1100  TempSrc:   PainSc: 0-No pain      Patients Stated Pain Goal: 2 (92/49/32 4199)  Complications: No apparent anesthesia complications

## 2018-09-28 NOTE — Anesthesia Postprocedure Evaluation (Signed)
Anesthesia Post Note  Patient: [name] [name]  Procedure(s) Performed: VIDEO BRONCHOSCOPY WITH ENDOBRONCHIAL ULTRASOUND (N/A )     Patient location during evaluation: PACU Anesthesia Type: General Level of consciousness: awake and alert Pain management: pain level controlled Vital Signs Assessment: post-procedure vital signs reviewed and stable Respiratory status: spontaneous breathing, nonlabored ventilation, respiratory function stable and patient connected to nasal cannula oxygen Cardiovascular status: blood pressure returned to baseline and stable Postop Assessment: no apparent nausea or vomiting Anesthetic complications: no    Last Vitals:  Vitals:   09/28/18 1130 09/28/18 1145  BP: (!) 97/52 (!) 100/57  Pulse: 72 68  Resp:    Temp:    SpO2: 93% 93%    Last Pain:  Vitals:   09/28/18 1145  TempSrc:   PainSc: [name]

## 2018-09-29 ENCOUNTER — Encounter (HOSPITAL_COMMUNITY): Payer: Self-pay | Admitting: Pulmonary Disease

## 2018-09-29 LAB — BASIC METABOLIC PANEL
Anion gap: 10 (ref 5–15)
BUN: 27 mg/dL — ABNORMAL HIGH (ref 8–23)
CO2: 24 mmol/L (ref 22–32)
Calcium: 9.2 mg/dL (ref 8.9–10.3)
Chloride: 102 mmol/L (ref 98–111)
Creatinine, Ser: 1.02 mg/dL (ref 0.61–1.24)
GFR calc Af Amer: >60 mL/min (ref >60)
GFR calc non Af Amer: >60 mL/min (ref >60)
Glucose, Bld: 150 mg/dL — ABNORMAL HIGH (ref 70–99)
Potassium: 4.2 mmol/L (ref 3.5–5.1)
Sodium: 136 mmol/L (ref 135–145)

## 2018-09-29 NOTE — Progress Notes (Signed)
   09/29/18 1028  Clinical Encounter Type  Visited With Patient  Visit Type Follow-up  Referral From Physician  Consult/Referral To Chaplain  The chaplain responded to MD request for Pt. spiritual care around the Pt. new lung cancer diagnosis.  The chaplain entered the Pt. room and found the Pt. sitting up in a corner chair, blinds closed and the room without artificial light. The Pt. welcomed the chaplain into his room and stated he was glad I came by. The Pt. Shared his cancer diagnosis. The Pt. mentioned a Oren Binet song "You Gave Me A Mountain."  I believe the song is an accurate reflection of where the Pt. may be spiritually.  The Pt. talked about the positives and negatives of his healthcare.  In the conversation he questioned other peoples religious beliefs, but he never questioned his own faith. The chaplain heard the Pt. appreciation for listening.  The chaplain will F/U as needed.

## 2018-09-30 ENCOUNTER — Inpatient Hospital Stay (HOSPITAL_COMMUNITY): Payer: Medicare PPO

## 2018-09-30 LAB — CULTURE, BODY FLUID W GRAM STAIN -BOTTLE: Culture: NO GROWTH

## 2018-09-30 LAB — CULTURE, BODY FLUID-BOTTLE

## 2018-09-30 LAB — LEGIONELLA PNEUMOPHILA SEROGP 1 UR AG: L. pneumophila Serogp 1 Ur Ag: NEGATIVE

## 2018-09-30 MED ORDER — LIDOCAINE 5 % EX PTCH
1.0000 | MEDICATED_PATCH | CUTANEOUS | Status: DC
  Administered 2018-09-30 – 2018-10-04 (×5): 1 via TRANSDERMAL
  Filled 2018-09-30 (×5): qty 1

## 2018-09-30 NOTE — Progress Notes (Signed)
PROGRESS NOTE    [name] [name]  XYI:[id number] DOB: August 26, 1934 DOA: 09/25/2018 PCP: McLean-Scocuzza, Nino Glow, MD   Brief Narrative: [name] [name] is a 85 y.o. man with medical history significant for hypertension, hyperlipidemia, diabetes, COPD on home oxygen. He presented secondary to requesting help with home oxygen, found to have a lung mass found to be secondary to [facility] lung caner.   Assessment & Plan:   Principal Problem:   Chronic respiratory failure (HCC) Active Problems:   Essential hypertension   Diabetes mellitus type 2 in nonobese (HCC)   Polysubstance abuse ([place])   Mass of upper lobe of left lung   Mass of left lung   Chronic respiratory failure with hypoxia Initial concern for possible pneumonia. procalcitonin undetectable. Patient was started on empiric vancomycin, flagyl and cefepime. Antibiotics discontinued in setting of undetectable procalcitonin and low suspicion for infection. -Oxygen therapy as needed -PT recommending HHPT  COPD -Continue Duoneb, Dulera  Lung mass Per discussion with pulmonology, patient with non-small cell carcinoma. Patient has quit smoking within the last couple of months. -Medical oncology consulted earlier in admission  Diabetes mellitus, type 2 On metformin as an outpatient  Essential hypertension Stable -Continue metoprolol  BPH -Continue tamsulosin  Homelessness Social work consulted.  Chest pain No mention of rib fracture on chest x-ray or CT scan. Reproducible. Likely costochondritis. Some mild improvement with analgesics and Voltaren gel initially now persistent -Lidocaine patch -Rib x-ray   DVT prophylaxis: Lovenox Code Status:   Code Status: DNR Family Communication: None at bedside Disposition Plan: Discharge pending determination of safe discharge plan   Consultants:   Pulmonology  Procedures:   Bronchoscopy (1/10)  Antimicrobials:  Vancomycin  Cefepime  Flagyl    Subjective: Right sided  chest pain  Objective: Vitals:   09/29/18 1902 09/29/18 2159 09/30/18 0631 09/30/18 0831  BP:  (!) 115/59  125/67  Pulse:  74  69  Resp:  18  12  Temp:  97.8 F (36.6 C)  97.7 F (36.5 C)  TempSrc:  Oral  Oral  SpO2: 98% 97% 98% 97%  Weight:      Height:       No intake or output data in the 24 hours ending 09/30/18 1333 Filed Weights   09/25/18 0123 09/25/18 1743 09/28/18 0821  Weight: 79.4 kg 78.7 kg 78.7 kg    Examination:  General exam: Appears calm and comfortable Respiratory system: Diminished. Respiratory effort normal. Cardiovascular system: S1 & S2 heard, RRR. No murmurs, rubs, gallops or clicks. Gastrointestinal system: Abdomen is nondistended, soft and nontender. No organomegaly or masses felt. Normal bowel sounds heard. Central nervous system: Alert and oriented. No focal neurological deficits. Extremities: No edema. No calf tenderness Skin: No cyanosis. No rashes Psychiatry: Judgement and insight appear normal. Flat affect  Data Reviewed: I have personally reviewed following labs and imaging studies  CBC: Recent Labs  Lab 09/25/18 0306 09/26/18 1414  WBC 11.9* 10.9*  NEUTROABS 8.8*  --   HGB 11.1* 11.3*  HCT 35.7* 35.1*  MCV 92.7 90.5  PLT 304 270   Basic Metabolic Panel: Recent Labs  Lab 09/25/18 0306 09/26/18 1414 09/29/18 0205  NA 139 137 136  K 3.7 3.9 4.2  CL 103 105 102  CO2 26 21* 24  GLUCOSE 97 119* 150*  BUN 23 19 27*  CREATININE 1.11 1.12 1.02  CALCIUM 9.3 9.2 9.2   GFR: Estimated Creatinine Clearance: 59.5 mL/min (by C-G formula based on SCr of 1.02 mg/dL). Liver  Function Tests: Recent Labs  Lab 09/25/18 0306  AST 16  ALT 15  ALKPHOS 67  BILITOT 0.7  PROT 7.0  ALBUMIN 3.3*   No results for input(s): LIPASE, AMYLASE in the last 168 hours. No results for input(s): AMMONIA in the last 168 hours. Coagulation Profile: No results for input(s): INR, PROTIME in the last 168 hours. Cardiac Enzymes: Recent Labs  Lab  09/26/18 1411 09/26/18 2115 09/27/18 0304  TROPONINI <0.03 <0.03 <0.03   BNP (last 3 results) No results for input(s): PROBNP in the last 8760 hours. HbA1C: No results for input(s): HGBA1C in the last 72 hours. CBG: Recent Labs  Lab 09/28/18 1107  GLUCAP 97   Lipid Profile: No results for input(s): CHOL, HDL, LDLCALC, TRIG, CHOLHDL, LDLDIRECT in the last 72 hours. Thyroid Function Tests: No results for input(s): TSH, T4TOTAL, FREET4, T3FREE, THYROIDAB in the last 72 hours. Anemia Panel: No results for input(s): VITAMINB12, FOLATE, FERRITIN, TIBC, IRON, RETICCTPCT in the last 72 hours. Sepsis Labs: Recent Labs  Lab 09/27/18 0304 09/28/18 0338  PROCALCITON <0.10 <0.10    Recent Results (from the past 240 hour(s))  Gram stain     Status: None   Collection Time: 09/25/18 11:47 AM  Result Value Ref Range Status   Specimen Description PLEURAL LEFT  Final   Special Requests NONE  Final   Gram Stain   Final    FEW WBC PRESENT,BOTH PMN AND MONONUCLEAR NO ORGANISMS SEEN Performed at [facility], [address] [place], [place] [address]    Report Status 09/25/2018 FINAL  Final  Culture, body fluid-bottle     Status: None   Collection Time: 09/25/18 11:47 AM  Result Value Ref Range Status   Specimen Description PLEURAL LEFT  Final   Special Requests NONE  Final   Culture   Final    NO GROWTH 5 DAYS Performed at [place] [address], [place], [place] [address]    Report Status 09/30/2018 FINAL  Final  Culture, blood (routine x 2)     Status: None (Preliminary result)   Collection Time: 09/27/18  1:38 PM  Result Value Ref Range Status   Specimen Description BLOOD LEFT ANTECUBITAL  Final   Special Requests AEROBIC BOTTLE ONLY Blood Culture adequate volume  Final   Culture   Final    NO GROWTH 3 DAYS Performed at [facility], [place] [address] [place], [place] [address]    Report Status PENDING  Incomplete  Culture, blood (routine x 2)      Status: None (Preliminary result)   Collection Time: 09/27/18  1:38 PM  Result Value Ref Range Status   Specimen Description BLOOD LEFT HAND  Final   Special Requests AEROBIC BOTTLE ONLY Blood Culture adequate volume  Final   Culture   Final    NO GROWTH 3 DAYS Performed at [facility], [place] [address] [place], [place] [address]    Report Status PENDING  Incomplete  Respiratory Panel by PCR     Status: None   Collection Time: 09/27/18  4:05 PM  Result Value Ref Range Status   Adenovirus NOT DETECTED NOT DETECTED Final   Coronavirus 229E NOT DETECTED NOT DETECTED Final   Coronavirus HKU1 NOT DETECTED NOT DETECTED Final   Coronavirus NL63 NOT DETECTED NOT DETECTED Final   Coronavirus OC43 NOT DETECTED NOT DETECTED Final   Metapneumovirus NOT DETECTED NOT DETECTED Final   Rhinovirus / Enterovirus NOT DETECTED NOT DETECTED  Final   Influenza A NOT DETECTED NOT DETECTED Final   Influenza B NOT DETECTED NOT DETECTED Final   Parainfluenza Virus 1 NOT DETECTED NOT DETECTED Final   Parainfluenza Virus 2 NOT DETECTED NOT DETECTED Final   Parainfluenza Virus 3 NOT DETECTED NOT DETECTED Final   Parainfluenza Virus 4 NOT DETECTED NOT DETECTED Final   Respiratory Syncytial Virus NOT DETECTED NOT DETECTED Final   Bordetella pertussis NOT DETECTED NOT DETECTED Final   Chlamydophila pneumoniae NOT DETECTED NOT DETECTED Final   Mycoplasma pneumoniae NOT DETECTED NOT DETECTED Final    Comment: Performed at [facility], [place] [address], [place], [place] [address]  MRSA PCR Screening     Status: None   Collection Time: 09/28/18  7:51 AM  Result Value Ref Range Status   MRSA by PCR NEGATIVE NEGATIVE Final    Comment:        The GeneXpert MRSA Assay (FDA approved for NASAL specimens only), is one component of a comprehensive MRSA colonization surveillance program. It is not intended to diagnose MRSA infection nor to guide or monitor treatment for MRSA infections. Performed at [facility], [place] [address] [place], [place] [address]   Culture, respiratory     Status: None (Preliminary result)   Collection Time: 09/28/18 10:42 AM  Result Value Ref Range Status   Specimen Description BRONCHIAL ALVEOLAR LAVAGE  Final   Special Requests NONE  Final   Gram Stain   Final    RARE WBC PRESENT, PREDOMINANTLY PMN NO ORGANISMS SEEN Performed at [facility], [place] [address], [place], [place] [address]    Culture NO GROWTH 2 DAYS  Final   Report Status PENDING  Incomplete         Radiology Studies: No results found.      Scheduled Meds: . atorvastatin  40 mg Oral Daily  . diclofenac sodium  2 g Topical QID  . docusate sodium  100 mg Oral BID  . ipratropium-albuterol  3 mL Nebulization TID  . lidocaine  1 patch Transdermal Q24H  . metoprolol tartrate  50 mg Oral BID  . mometasone-formoterol  2 puff Inhalation BID  . tamsulosin  0.4 mg Oral Daily   Continuous Infusions: . lactated ringers 10 mL/hr at 09/28/18 5277     LOS: 4 days     Cordelia Poche, MD [facility] 09/30/2018, 1:33 PM  If 7PM-7AM, please contact night-coverage [email or web address]

## 2018-10-01 ENCOUNTER — Encounter: Payer: Self-pay | Admitting: *Deleted

## 2018-10-01 ENCOUNTER — Inpatient Hospital Stay (HOSPITAL_COMMUNITY): Payer: Medicare PPO

## 2018-10-01 DIAGNOSIS — R918 Other nonspecific abnormal finding of lung field: Secondary | ICD-10-CM

## 2018-10-01 LAB — CULTURE, RESPIRATORY

## 2018-10-01 LAB — CULTURE, RESPIRATORY W GRAM STAIN: Culture: NO GROWTH

## 2018-10-01 MED ORDER — PREDNISONE 20 MG PO TABS
40.0000 mg | ORAL_TABLET | Freq: Every day | ORAL | Status: DC
  Administered 2018-10-02 – 2018-10-04 (×3): 40 mg via ORAL
  Filled 2018-10-01 (×4): qty 2

## 2018-10-01 NOTE — Progress Notes (Signed)
Physical Therapy Treatment Patient Details Name: [name] [name] MRN: [id number] DOB: Apr 14, 1934 Today's Date: 10/01/2018    History of Present Illness Patient is a 85 y/o male who presents with SOB, cough and bloody sputum. Also wants more 02 as his 7 concentrator is not working. Noted to have a left upper lobe mass and mediastinal lymphadenopathy s/p bronchoscopy 1/10. PMH includes COPD, DM, HTN, HLD,     PT Comments    Patient received in chair, complaining of chest pain but willing to participate in PT, SPO2 on 3LPM 100% at rest; able to complete functional transfers with S and RW, gait approximately 226f with RW and min guard with spO2 90-92% on 3LPM, attempted gait approximately 560fon room air but SpO2 dropped to 84% and required replacement of 3LPM O2 to recover to 94%. Patient very talkative and easily distracted by environment, Max cues to stay on task. He was left up in his chair with all needs met this morning.    Follow Up Recommendations  Home health PT;Supervision for mobility/OOB(pending finding somewhere to go )     Equipment Recommendations  Rolling walker with 5" wheels    Recommendations for Other Services       Precautions / Restrictions Precautions Precautions: Fall Precaution Comments: watch 02 Restrictions Weight Bearing Restrictions: No    Mobility  Bed Mobility               General bed mobility comments: OOB in chair   Transfers Overall transfer level: Needs assistance Equipment used: Rolling walker (2 wheeled) Transfers: Sit to/from Stand Sit to Stand: Supervision         General transfer comment: S for safety, VC for safety as he is impulsive with mobility   Ambulation/Gait Ambulation/Gait assistance: Min guard Gait Distance (Feet): 200 Feet Assistive device: Rolling walker (2 wheeled) Gait Pattern/deviations: Step-through pattern;Decreased stride length;Drifts right/left Gait velocity: decreased    General Gait Details: slow  gait but steady with RW, cues for straight line navigation with RW and safe use of device. SpO2 in 90s on 3LPM O2, able to ambualte approximately 5049fn room air but had to replace O2 as SpO2 dropped to 84%, recovered to 94% on 3LPM    Stairs             Wheelchair Mobility    Modified Rankin (Stroke Patients Only)       Balance Overall balance assessment: Needs assistance Sitting-balance support: Feet supported;No upper extremity supported Sitting balance-Leahy Scale: Good     Standing balance support: During functional activity;No upper extremity supported Standing balance-Leahy Scale: Fair Standing balance comment: min guard and cues for safety, intermittently unsteady                             Cognition Arousal/Alertness: Awake/alert Behavior During Therapy: WFL for tasks assessed/performed Overall Cognitive Status: Within Functional Limits for tasks assessed                                        Exercises      General Comments        Pertinent Vitals/Pain Pain Assessment: 0-10 Pain Score: 6  Pain Location: chest Pain Descriptors / Indicators: Discomfort;Grimacing;Aching Pain Intervention(s): Monitored during session;Premedicated before session;Limited activity within patient's tolerance    Home Living  Prior Function            PT Goals (current goals can now be found in the care plan section) Acute Rehab PT Goals Patient Stated Goal: to get stronger and find somewhere to live PT Goal Formulation: With patient Time For Goal Achievement: 10/12/18 Potential to Achieve Goals: Good Progress towards PT goals: Progressing toward goals    Frequency    Min 3X/week      PT Plan Current plan remains appropriate    Co-evaluation              AM-PAC PT "6 Clicks" Mobility   Outcome Measure  Help needed turning from your back to your side while in a flat bed without using  bedrails?: None Help needed moving from lying on your back to sitting on the side of a flat bed without using bedrails?: A Little Help needed moving to and from a bed to a chair (including a wheelchair)?: A Little Help needed standing up from a chair using your arms (e.g., wheelchair or bedside chair)?: None Help needed to walk in hospital room?: A Little Help needed climbing 3-5 steps with a railing? : A Little 6 Click Score: 20    End of Session   Activity Tolerance: Patient tolerated treatment well Patient left: in chair;with call bell/phone within reach   PT Visit Diagnosis: Pain;Difficulty in walking, not elsewhere classified (R26.2);Unsteadiness on feet (R26.81);Muscle weakness (generalized) (M62.81) Pain - Right/Left: (chest ) Pain - part of body: (chest )     Time: 1030-1056 PT Time Calculation (min) (ACUTE ONLY): 26 min  Charges:  $Gait Training: 23-37 mins                     Deniece Ree PT, DPT, CBIS  Supplemental Physical Therapist [place]    Pager [phone] Acute Rehab Office [phone]

## 2018-10-01 NOTE — Care Management Important Message (Signed)
Important Message  Patient Details  Name: [name] [name] MRN: [id number] Date of Birth: 09-Jan-1934   Medicare Important Message Given:  Yes    [name] 10/01/2018, 4:05 PM

## 2018-10-01 NOTE — Progress Notes (Signed)
Oncology Nurse Navigator Documentation  Oncology Nurse Navigator Flowsheets 10/01/2018  Navigator Location [facility]  Referral date to RadOnc/MedOnc 10/01/2018  Navigator Encounter Type Other/I updated Dr. Julien Nordmann on referral. Patient has been seen by him last week.  Patient is still in the hospital and needs further scans for work up.  MRI brain ordered per Dr. Julien Nordmann.   Treatment Phase Abnormal Scans  Barriers/Navigation Needs Coordination of Care  Interventions Coordination of Care  Coordination of Care Other  Acuity Level 2  Time Spent with Patient 30

## 2018-10-01 NOTE — Progress Notes (Signed)
PROGRESS NOTE    [name] [name]  WYO:[id number] DOB: 08-04-1934 DOA: 09/25/2018 PCP: McLean-Scocuzza, Nino Glow, MD   Brief Narrative: [name] [name] is a 85 y.o. man with medical history significant for hypertension, hyperlipidemia, diabetes, COPD on home oxygen. He presented secondary to requesting help with home oxygen, found to have a lung mass found to be secondary to [facility] lung caner.   Assessment & Plan:   Principal Problem:   Chronic respiratory failure (HCC) Active Problems:   Essential hypertension   Diabetes mellitus type 2 in nonobese (HCC)   Polysubstance abuse ([place])   Mass of upper lobe of left lung   Mass of left lung   Chronic respiratory failure with hypoxia Initial concern for possible pneumonia. procalcitonin undetectable. Patient was started on empiric vancomycin, flagyl and cefepime. Antibiotics discontinued in setting of undetectable procalcitonin and low suspicion for infection. -Oxygen therapy as needed -PT recommending HHPT  COPD -Continue Duoneb, Dulera  Left upper lobe lung mass Per discussion with pulmonology, patient with non-small cell carcinoma. Patient has quit smoking within the last couple of months. Recent pleural fluid without evidence of malignancy. -Medical oncology consulted earlier in admission  Diabetes mellitus, type 2 On metformin as an outpatient  Essential hypertension Stable -Continue metoprolol  BPH -Continue tamsulosin  Homelessness Social work consulted.  Chest pain No mention of rib fracture on chest x-ray or CT scan. Reproducible. Likely costochondritis. Some mild improvement with analgesics and Voltaren gel initially now persistent. X-ray without evidence of fracture.  -Continue Lidocaine patch, Voltaren gel, analgesics   DVT prophylaxis: Lovenox Code Status:   Code Status: DNR Family Communication: None at bedside Disposition Plan: Discharge pending determination of safe discharge plan per CSW   Consultants:    Pulmonology  Procedures:   Bronchoscopy (1/10)  Antimicrobials:  Vancomycin  Cefepime  Flagyl    Subjective: Right sided chest pain  Objective: Vitals:   09/30/18 2112 10/01/18 0721 10/01/18 0800 10/01/18 0801  BP: 124/61 130/63    Pulse: 74 66    Resp: 18 15    Temp: 98.2 F (36.8 C) (!) 97.5 F (36.4 C)    TempSrc: Oral Oral    SpO2: 99% 100% 99% 99%  Weight:      Height:        Intake/Output Summary (Last 24 hours) at 10/01/2018 1035 Last data filed at 10/01/2018 0800 Gross per 24 hour  Intake 240 ml  Output -  Net 240 ml   Filed Weights   09/25/18 0123 09/25/18 1743 09/28/18 0821  Weight: 79.4 kg 78.7 kg 78.7 kg    Examination:  General exam: Appears calm and comfortable Respiratory system: Clear to auscultation. Respiratory effort normal. Chest: significant tenderness to palpation of 6-7th right rib by sternum Cardiovascular system: S1 & S2 heard, RRR. No murmurs, rubs, gallops or clicks. Gastrointestinal system: Abdomen is nondistended, soft and nontender. No organomegaly or masses felt. Normal bowel sounds heard. Central nervous system: Alert and oriented. No focal neurological deficits. Extremities: No edema. No calf tenderness Skin: No cyanosis. No rashes Psychiatry: Judgement and insight appear normal. Mood & affect appropriate.   Data Reviewed: I have personally reviewed following labs and imaging studies  CBC: Recent Labs  Lab 09/25/18 0306 09/26/18 1414  WBC 11.9* 10.9*  NEUTROABS 8.8*  --   HGB 11.1* 11.3*  HCT 35.7* 35.1*  MCV 92.7 90.5  PLT 304 774   Basic Metabolic Panel: Recent Labs  Lab 09/25/18 0306 09/26/18 1414 09/29/18 0205  NA  139 137 136  K 3.7 3.9 4.2  CL 103 105 102  CO2 26 21* 24  GLUCOSE 97 119* 150*  BUN 23 19 27*  CREATININE 1.11 1.12 1.02  CALCIUM 9.3 9.2 9.2   GFR: Estimated Creatinine Clearance: 59.5 mL/min (by C-G formula based on SCr of 1.02 mg/dL). Liver Function Tests: Recent Labs  Lab  09/25/18 0306  AST 16  ALT 15  ALKPHOS 67  BILITOT 0.7  PROT 7.0  ALBUMIN 3.3*   No results for input(s): LIPASE, AMYLASE in the last 168 hours. No results for input(s): AMMONIA in the last 168 hours. Coagulation Profile: No results for input(s): INR, PROTIME in the last 168 hours. Cardiac Enzymes: Recent Labs  Lab 09/26/18 1411 09/26/18 2115 09/27/18 0304  TROPONINI <0.03 <0.03 <0.03   BNP (last 3 results) No results for input(s): PROBNP in the last 8760 hours. HbA1C: No results for input(s): HGBA1C in the last 72 hours. CBG: Recent Labs  Lab 09/28/18 1107  GLUCAP 97   Lipid Profile: No results for input(s): CHOL, HDL, LDLCALC, TRIG, CHOLHDL, LDLDIRECT in the last 72 hours. Thyroid Function Tests: No results for input(s): TSH, T4TOTAL, FREET4, T3FREE, THYROIDAB in the last 72 hours. Anemia Panel: No results for input(s): VITAMINB12, FOLATE, FERRITIN, TIBC, IRON, RETICCTPCT in the last 72 hours. Sepsis Labs: Recent Labs  Lab 09/27/18 0304 09/28/18 0338  PROCALCITON <0.10 <0.10    Recent Results (from the past 240 hour(s))  Gram stain     Status: None   Collection Time: 09/25/18 11:47 AM  Result Value Ref Range Status   Specimen Description PLEURAL LEFT  Final   Special Requests NONE  Final   Gram Stain   Final    FEW WBC PRESENT,BOTH PMN AND MONONUCLEAR NO ORGANISMS SEEN Performed at [facility], [address] [place], [place] [address]    Report Status 09/25/2018 FINAL  Final  Culture, body fluid-bottle     Status: None   Collection Time: 09/25/18 11:47 AM  Result Value Ref Range Status   Specimen Description PLEURAL LEFT  Final   Special Requests NONE  Final   Culture   Final    NO GROWTH 5 DAYS Performed at [facility] [address] [place], [place] [address]    Report Status 09/30/2018 FINAL  Final  Culture, blood (routine x 2)     Status: None (Preliminary result)   Collection Time: 09/27/18  1:38 PM  Result Value Ref Range  Status   Specimen Description BLOOD LEFT ANTECUBITAL  Final   Special Requests AEROBIC BOTTLE ONLY Blood Culture adequate volume  Final   Culture   Final    NO GROWTH 4 DAYS Performed at [facility], [place] [address] [place], [place] [address]    Report Status PENDING  Incomplete  Culture, blood (routine x 2)     Status: None (Preliminary result)   Collection Time: 09/27/18  1:38 PM  Result Value Ref Range Status   Specimen Description BLOOD LEFT HAND  Final   Special Requests AEROBIC BOTTLE ONLY Blood Culture adequate volume  Final   Culture   Final    NO GROWTH 4 DAYS Performed at [facility], [place] [address], [place], [place] [address]    Report Status PENDING  Incomplete  Respiratory Panel by PCR     Status: None   Collection Time: 09/27/18  4:05 PM  Result Value Ref Range Status   Adenovirus NOT DETECTED  NOT DETECTED Final   Coronavirus 229E NOT DETECTED NOT DETECTED Final   Coronavirus HKU1 NOT DETECTED NOT DETECTED Final   Coronavirus NL63 NOT DETECTED NOT DETECTED Final   Coronavirus OC43 NOT DETECTED NOT DETECTED Final   Metapneumovirus NOT DETECTED NOT DETECTED Final   Rhinovirus / Enterovirus NOT DETECTED NOT DETECTED Final   Influenza A NOT DETECTED NOT DETECTED Final   Influenza B NOT DETECTED NOT DETECTED Final   Parainfluenza Virus 1 NOT DETECTED NOT DETECTED Final   Parainfluenza Virus 2 NOT DETECTED NOT DETECTED Final   Parainfluenza Virus 3 NOT DETECTED NOT DETECTED Final   Parainfluenza Virus 4 NOT DETECTED NOT DETECTED Final   Respiratory Syncytial Virus NOT DETECTED NOT DETECTED Final   Bordetella pertussis NOT DETECTED NOT DETECTED Final   Chlamydophila pneumoniae NOT DETECTED NOT DETECTED Final   Mycoplasma pneumoniae NOT DETECTED NOT DETECTED Final    Comment: Performed at [facility], [place] [address], [place], [place] [address]  MRSA PCR Screening     Status: None   Collection Time: 09/28/18  7:51 AM  Result Value Ref Range Status    MRSA by PCR NEGATIVE NEGATIVE Final    Comment:        The GeneXpert MRSA Assay (FDA approved for NASAL specimens only), is one component of a comprehensive MRSA colonization surveillance program. It is not intended to diagnose MRSA infection nor to guide or monitor treatment for MRSA infections. Performed at [facility], [place] [address], [place], [place] [address]   Culture, respiratory     Status: None (Preliminary result)   Collection Time: 09/28/18 10:42 AM  Result Value Ref Range Status   Specimen Description BRONCHIAL ALVEOLAR LAVAGE  Final   Special Requests NONE  Final   Gram Stain   Final    RARE WBC PRESENT, PREDOMINANTLY PMN NO ORGANISMS SEEN Performed at [facility], [place] [address], [place], [place] [address]    Culture NO GROWTH 2 DAYS  Final   Report Status PENDING  Incomplete         Radiology Studies: Dg Ribs Unilateral Right  Result Date: 09/30/2018 CLINICAL DATA:  Atypical right anterior chest pain EXAM: RIGHT RIBS - 2 VIEW COMPARISON:  Chest radiograph dated 09/25/2018 FINDINGS: No displaced right rib fracture is seen. Mild bibasilar scarring. The heart is normal in size. IMPRESSION: No displaced right rib fracture is seen. Electronically Signed   By: Julian Hy M.D.   On: 09/30/2018 20:53        Scheduled Meds: . atorvastatin  40 mg Oral Daily  . diclofenac sodium  2 g Topical QID  . docusate sodium  100 mg Oral BID  . ipratropium-albuterol  3 mL Nebulization TID  . lidocaine  1 patch Transdermal Q24H  . metoprolol tartrate  50 mg Oral BID  . mometasone-formoterol  2 puff Inhalation BID  . tamsulosin  0.4 mg Oral Daily   Continuous Infusions: . lactated ringers 10 mL/hr at 09/28/18 2993     LOS: 5 days     Cordelia Poche, MD [facility] 10/01/2018, 10:35 AM  If 7PM-7AM, please contact night-coverage [email or web address]

## 2018-10-01 NOTE — Progress Notes (Signed)
NAME:  [name] [name], MRN:  [id number], DOB:  1934/02/17, LOS: 5 ADMISSION DATE:  09/25/2018, CONSULTATION DATE:  09/27/2018 REFERRING MD:  Dr. Tawanna Solo, CHIEF COMPLAINT:  Left upper lobe mass  Brief History   85 year old male found to have left upper lobe mass in addition to multiple nodules, including a bilobed nodule in the lingula, mediastinal lymphadenopathy, and left pleural effusion (pleural fluid with negative cytology for malignancy). Oncology consulted and felt that pulmonary should be consulted for further investigation and consideration for EBUS and biopsy.    Past Medical History  Former smoker, COPD on home O2, diabetes, HTN, dyslipidemia, GERD, anxiety, depression, restless leg syndrome, lumbar spinal stenosis, kidney stones, colon cancer (dx 11/2017 in [place] s/p partial colectomy- no chemo or radiation per patient), DNR  Significant Hospital Events   1/7 Admit to [facility]  Consults:  Oncology 1/8  Procedures:  1/8 left US guided thoracentesis- 300 ml of clear red fluid   Significant Diagnostic Tests:  1/7 CTA chest PE >> 3.1 cm masslike finding in the left upper lobe along the major fissure. There are additional nodules within the left lung, both pleural and peribronchovascular. Although multiple nodules are seen, there limited to the upper lobe. A bilobed nodule in the lingula measures 25 x 18 mm on series 2, image 102. There is a small left pleural effusion with subpleural nodular appearance, likely malignant. Dependent atelectasis. Bullous emphysema worse on the left. Impression:  1. 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 2. Negative for pulmonary embolism. 3. Bullous emphysema.  1/8 TTE >> limited study, LVEF 55-60%, RV not well visualized by at least mildly dilated; RV function looks preserved; moderate PH with PAP 64 mmHg and mild septal  flattening suggestive of RV pressure/ volume overload  Micro Data:  Sputum cx >>neg Blood cultures >>neg RVP >>neg  Antimicrobials:  1/9 vancomycin >>off 1/9 cefepime >>off 1/9 flagyl >>off  Interim history/subjective:  Still feeling sig right chest discomfort. Some cough  Objective   Blood pressure 130/63, pulse 66, temperature (Abnormal) 97.5 F (36.4 C), temperature source Oral, resp. rate 15, height 5\' 11"  (1.803 m), weight 78.7 kg, SpO2 99 %.        Intake/Output Summary (Last 24 hours) at 10/01/2018 1110 Last data filed at 10/01/2018 0800 Gross per 24 hour  Intake 240 ml  Output no documentation  Net 240 ml   Filed Weights   09/25/18 0123 09/25/18 1743 09/28/18 0821  Weight: 79.4 kg 78.7 kg 78.7 kg    Examination: General: Elderly 85 year old male patient he is quite frail complaining of some right-sided pleuritic discomfort HEENT normocephalic atraumatic no jugular venous distention phonation is strong Pulmonary: Coarse rhonchi and wheezing on the right no accessory use currently currently Cardiac regular rate and rhythm Abdomen: Soft nontender no organomegaly Extremities: Warm and dry brisk cap refill GU: Voiding  Assessment & Plan:   Chronic Hypoxic respiratory failure  COPD LUL lung mass NSCLCA HTN BPH Deconditioning    Left upper lobe mass (NSCLCA) from FOB 1/10 Plan Needs ONC follow up   Chronic hypoxic respiratory failure 2/2  COPD on home O2/ bullous emphysema L>R  Possible AECOPD; has significant right-sided pleuritic discomfort and wheezing Plan Cont BDs Add pred taper  Repeat chest x-ray Home O2 Will make f/u in our office  Left pleural effusion Exudative by lights -cultures and cytology neg Plan F/u imaging  PRN Would repeat cytology if returns    We will see him again tomorrow. Erick Colace ACNP-BC [place] Pager # [phone] OR # [phone] if no answer

## 2018-10-02 ENCOUNTER — Encounter: Payer: Self-pay | Admitting: Internal Medicine

## 2018-10-02 ENCOUNTER — Inpatient Hospital Stay (HOSPITAL_COMMUNITY): Payer: Medicare PPO

## 2018-10-02 ENCOUNTER — Encounter (HOSPITAL_COMMUNITY): Payer: Self-pay | Admitting: Acute Care

## 2018-10-02 ENCOUNTER — Telehealth: Payer: Self-pay | Admitting: Internal Medicine

## 2018-10-02 DIAGNOSIS — J449 Chronic obstructive pulmonary disease, unspecified: Secondary | ICD-10-CM

## 2018-10-02 DIAGNOSIS — R079 Chest pain, unspecified: Secondary | ICD-10-CM

## 2018-10-02 LAB — CULTURE, BLOOD (ROUTINE X 2)
Culture: NO GROWTH
Culture: NO GROWTH
Special Requests: ADEQUATE
Special Requests: ADEQUATE

## 2018-10-02 LAB — GLUCOSE, CAPILLARY: Glucose-Capillary: 161 mg/dL — ABNORMAL HIGH (ref 70–99)

## 2018-10-02 MED ORDER — GUAIFENESIN ER 600 MG PO TB12
600.0000 mg | ORAL_TABLET | Freq: Two times a day (BID) | ORAL | Status: DC
  Administered 2018-10-02 – 2018-10-04 (×5): 600 mg via ORAL
  Filled 2018-10-02 (×5): qty 1

## 2018-10-02 MED ORDER — SODIUM CHLORIDE 3 % IN NEBU
4.0000 mL | INHALATION_SOLUTION | Freq: Every day | RESPIRATORY_TRACT | Status: AC
  Administered 2018-10-02 – 2018-10-04 (×3): 4 mL via RESPIRATORY_TRACT
  Filled 2018-10-02 (×4): qty 4

## 2018-10-02 NOTE — Care Management Note (Signed)
Case Management Note  Patient Details  Name: FELTON BUCZYNSKI MRN: [id number] Date of Birth: February 23, 1934  Subjective/Objective:     NCM spoke with [employer] for the home oxygen, patient states his home address is  [address] [place] [address].  [facility] will bring a tank to patient in the am, they are awaiting ambulatory sats to be put in epic.  NCM spoke with Estill Bamberg.               Action/Plan: Will need home oxygen and HH services set up, patient confirms address in system.  Expected Discharge Date:  09/26/18               Expected Discharge Plan:  [place])  In-House Referral:  Clinical Social Work  Discharge planning Services  CM Consult  Post Acute Care Choice:  Durable Medical Equipment, Home Health Choice offered to:  Patient  DME Arranged:  Oxygen DME Agency:  [facility]  HH Arranged:    [place] Agency:     Status of Service:  In process, will continue to follow  If discussed at Long Length of Stay Meetings, dates discussed:    Additional Comments:  Zenon Mayo, RN 10/02/2018, 5:04 PM

## 2018-10-02 NOTE — Progress Notes (Addendum)
NAME:  [name] [name], MRN:  [id number], DOB:  [birth year], LOS: 23 ADMISSION DATE:  09/25/2018, CONSULTATION DATE:  09/27/2018 REFERRING MD:  Dr. Tawanna Solo, CHIEF COMPLAINT:  Left upper lobe mass  Brief History   85 year old male former smoker ( quit 2019 with a 50 pack year smoking history))  found to have left upper lobe mass in addition to multiple nodules, including a bilobed nodule in the lingula, mediastinal lymphadenopathy, and left pleural effusion (pleural fluid with negative cytology for malignancy). Oncology consulted and felt that pulmonary should be consulted for further investigation and consideration for EBUS and biopsy.     Past Medical History  Former smoker, COPD on home O2, diabetes, HTN, dyslipidemia, GERD, anxiety, depression, restless leg syndrome, lumbar spinal stenosis, kidney stones, colon cancer (dx 11/2017 in [place] s/p partial colectomy- no chemo or radiation per patient), DNR  Of note: He was treated a few weeks ago at [facility] as well as [facility] for cellulitis with suspicious necrotizing fasciitis and septic shock requiring intubation. He was rehabed at [facility]>> he left there AMA.>> remains deconditioned from hospitalization.  Significant Hospital Events   1/7 Admit to [facility]  Consults:  Oncology 1/8  Procedures:  1/8 left US guided thoracentesis- 300 ml of clear red fluid  MRI Brain>> pending  Significant Diagnostic Tests:  1/7 CTA chest PE >> 3.1 cm masslike finding in the left upper lobe along the major fissure. There are additional nodules within the left lung, both pleural and peribronchovascular. Although multiple nodules are seen, there limited to the upper lobe. A bilobed nodule in the lingula measures 25 x 18 mm on series 2, image 102. There is a small left pleural effusion with subpleural nodular appearance, likely malignant. Dependent atelectasis. Bullous emphysema worse on the left. Impression:  1. Malignant findings in the left  chest, likely primary upper lobe cancer with intralobar spread, ipsilateral adenopathy, and small but malignant appearing left pleural effusion. 2. Negative for pulmonary embolism. 3. Bullous emphysema.  1/8 TTE >> limited study, LVEF 55-60%, RV not well visualized by at least mildly dilated; RV function looks preserved; moderate PH with PAP 64 mmHg and mild septal flattening suggestive of RV pressure/ volume overload  1/10>> FOB Dr. [place] likely non-small cell lung cancer  Micro Data:  Sputum cx >>neg Blood cultures >>neg RVP >>neg  Antimicrobials:  1/9 vancomycin >>off 1/9 cefepime >>off 1/9 flagyl >>off  Interim history/subjective:  Still feeling some  right chest discomfort. States he found morphine and heat to help last night. He is very angry that he " does not qualify" for rehab. Sent Education officer, museum out of the room and states he didn't want any help. Objective   Blood pressure 125/77, pulse 88, temperature 97.8 F (36.6 C), resp. rate 20, height 5\' 11"  (1.803 m), weight 78.7 kg, SpO2 97 %.    FiO2 (%):  [32 %] 32 %   Intake/Output Summary (Last 24 hours) at 10/02/2018 1144 Last data filed at 10/01/2018 1700 Gross per 24 hour  Intake 720 ml  Output -  Net 720 ml   Filed Weights   09/25/18 0123 09/25/18 1743 09/28/18 0821  Weight: 79.4 kg 78.7 kg 78.7 kg    Examination: General: Elderly 85 year old male patient he is quite frail complaining of some right-sided pleuritic discomfort, he is angry he does not qualify for rehab HEENT normocephalic atraumatic no jugular venous distention , strong voice Pulmonary: Bilateral chest excursion, Coarse rhonchi and few wheezes on the  right,  no accessory , speaks in full sentences Cardiac S1, S2, regular rate and rhythm, No MRG Abdomen: Soft nontender, ND, BS +,  no organomegaly Extremities: Warm and dry brisk cap refill, no obvious deformities GU: Voiding  Assessment & Plan:   Chronic Hypoxic respiratory failure  COPD LUL  lung mass NSCLCA HTN BPH Deconditioning    Left upper lobe mass (NSCLCA) from FOB 1/10 Plan Needs ONC follow up>> Dr.  Julien Nordmann has seen patient 09/26/2018. MRI Brain/PET scanning per oncology to stage  Chronic hypoxic respiratory failure 2/2  COPD on home O2/ bullous emphysema L>R  Possible AECOPD; has significant right-sided pleuritic discomfort and wheezing Plan Cont BDs Continue  pred taper  Trend  chest x-ray Ambulatory saturation prior to discharge Home O2 upon discharge Titrate oxygen for Saturation goals of 88-92% Aggressive pulmonary Toilet IS as able OOB to chair Pulmonary Follow up as OP.>> Will need to determine [place] vs [place] office at discharge, as he does not know where he is going to go upon DC  Left pleural effusion Exudative by lights -cultures and cytology neg Plan F/u imaging PRN and at pulmonary follow up Would repeat cytology if effusion re-accumulates   Right chest discomfort Plan: Per Primary Team   Of note: Pt. Has been told he does not qualify for rehab. He is very angry about this and has told SW he does not want any help. He is aware he has OP follow up in [facility] office scheduled for tomorrow 1/15. He has told me he cannot make that appointment.I will cancel this appointment as the patient is afraid he will be charged a No Show fee.   Primary team will need to make hospital follow up appointment at discharge. Pulmonary will make Pulmonary Follow up Appointment>> location to be determined on his discharge plans.   Magdalen Spatz, AGACNP-BC [place] Pager # [phone] OR # [phone] if no answer 10/02/2018 11:47 AM

## 2018-10-02 NOTE — Progress Notes (Signed)
Physical Therapy Treatment Patient Details Name: [name] [name] MRN: [id number] DOB: 23-Feb-1934 Today's Date: 10/02/2018    History of Present Illness Patient is a 85 y/o male who presents with SOB, cough and bloody sputum. Also wants more 02 as his 76 concentrator is not working. Noted to have a left upper lobe mass and mediastinal lymphadenopathy s/p bronchoscopy 1/10. PMH includes COPD, DM, HTN, HLD,     PT Comments    Pt limited by nausea with walking today - O2 sats (on 3L) and HR and blood sugars OK.  Pt continues with congestion - not wanting to cough as chest hurts - would benefit from heating pad to help with pain management and help promote coughing.  Pt to benefit from frequent walking as tolerated - focus on breathing strategies.   Follow Up Recommendations  Home health PT;Supervision for mobility/OOB     Equipment Recommendations  Rolling walker with 5" wheels    Recommendations for Other Services       Precautions / Restrictions Precautions Precautions: Fall Precaution Comments: watch 02 Restrictions Weight Bearing Restrictions: No    Mobility  Bed Mobility               General bed mobility comments: OOB in chair   Transfers Overall transfer level: Needs assistance Equipment used: Rolling walker (2 wheeled) Transfers: Sit to/from Stand Sit to Stand: Supervision         General transfer comment: Pt hurts in right side of chest with pushing up to stand. would benefit from higher seat - pillow in seat - to make it easeir to get up and less pushing required.  Ambulation/Gait Ambulation/Gait assistance: Min guard Gait Distance (Feet): 100 Feet Assistive device: Rolling walker (2 wheeled)       General Gait Details: Pt walked 100 feet wtih RW - flexed posture.  pt given cues for breathing strategies - on 3L O2.  Pt's O2 91% at rest on 2L.  I pushed up to 3L as this is what pt is on at home for long time.  Pt 92% at rest on 3L.  Pt walked 100 feet -  did well with min guard but then became nauseous - his O2 sats stayed 89-94%.  His HR stayed 78-86bpm.  Pts color was good.  I had to have chair pulled up for pt to sit.  his nausea only went away when he sat down.  he said this happened yesterday. I had nursing check his blood sugars and he is 161.  Pt unsure why this happens to him.   Stairs             Wheelchair Mobility    Modified Rankin (Stroke Patients Only)       Balance Overall balance assessment: Needs assistance             Standing balance comment: worked on posture in standing - standing more erect so can get better posture for breathing.                            Cognition Arousal/Alertness: Awake/alert Behavior During Therapy: WFL for tasks assessed/performed Overall Cognitive Status: Within Functional Limits for tasks assessed                                        Exercises      General  Comments General comments (skin integrity, edema, etc.): I offered to get pt inspirometer to help encourage deeper breathin g- he refused and said he has had in the past and didnt like it      Pertinent Vitals/Pain Pain Assessment: 0-10 Pain Score: 6  Pain Location: chest Pain Descriptors / Indicators: Aching;Discomfort;Guarding;Grimacing Pain Intervention(s): Limited activity within patient's tolerance;Monitored during session;Repositioned    Home Living                      Prior Function            PT Goals (current goals can now be found in the care plan section) Acute Rehab PT Goals Patient Stated Goal: to get stronger and find somewhere to live PT Goal Formulation: With patient Progress towards PT goals: Progressing toward goals    Frequency           PT Plan Current plan remains appropriate    Co-evaluation              AM-PAC PT "6 Clicks" Mobility   Outcome Measure  Help needed turning from your back to your side while in a flat bed  without using bedrails?: None Help needed moving from lying on your back to sitting on the side of a flat bed without using bedrails?: A Little Help needed moving to and from a bed to a chair (including a wheelchair)?: A Little Help needed standing up from a chair using your arms (e.g., wheelchair or bedside chair)?: A Little Help needed to walk in hospital room?: A Little Help needed climbing 3-5 steps with a railing? : A Little 6 Click Score: 19    End of Session Equipment Utilized During Treatment: Gait belt Activity Tolerance: Treatment limited secondary to medical complications (Comment) Patient left: in chair;with call bell/phone within reach Nurse Communication: Mobility status PT Visit Diagnosis: Pain;Difficulty in walking, not elsewhere classified (R26.2);Unsteadiness on feet (R26.81);Muscle weakness (generalized) (M62.81)     Time: 3295-1884 PT Time Calculation (min) (ACUTE ONLY): 40 min  Charges:  $Gait Training: 23-37 mins                 10/02/2018   Rande Lawman, PT    Loyal Buba 10/02/2018, 11:01 AM

## 2018-10-02 NOTE — Progress Notes (Signed)
Covering CSW was asked to speak with patient regarding his discharge plan. CSW and RN went into the room. Patient was immediately defensive and stated that he did not want to speak with CSW anymore. CSW encouraged patient to explain his frustrations but patient would only state that he is a grown man and has a place to stay. CSW inquired about transportation and he stated that he has a brand new car that his son will bring him. CSW also asked about his concentrator and he stated that CSW could look at it since it was in the car. Patient stated that he liked all the hospital staff except CSW and case management and that he would never be returning to [facility]. CSW provided supportive listening and left the room once patient became too agitated.   Percell Locus Shirlena Brinegar LCSW [phone]

## 2018-10-02 NOTE — Progress Notes (Addendum)
PROGRESS NOTE    [name] [name]  ZSM:[id number] DOB: July 04, 1934 DOA: 09/25/2018 PCP: McLean-Scocuzza, Nino Glow, MD   Brief Narrative: [name] [name] is a 85 y.o. man with medical history significant for hypertension, hyperlipidemia, diabetes, COPD on home oxygen. He presented secondary to requesting help with home oxygen, found to have a lung mass found to be secondary to [facility] lung caner.   Assessment & Plan:   Principal Problem:   Chronic respiratory failure (HCC) Active Problems:   Essential hypertension   Diabetes mellitus type 2 in nonobese (HCC)   Polysubstance abuse ([place])   Mass of upper lobe of left lung   Mass of left lung   Chronic respiratory failure with hypoxia Initial concern for possible pneumonia. procalcitonin undetectable. Patient was started on empiric vancomycin, flagyl and cefepime. Antibiotics discontinued in setting of undetectable procalcitonin and low suspicion for infection. -Oxygen therapy as needed -PT recommending HHPT  COPD -Continue Duoneb, Dulera  Left upper lobe lung mass Per discussion with pulmonology, patient with non-small cell carcinoma. Patient has quit smoking within the last couple of months. Recent pleural fluid without evidence of malignancy. -Medical oncology consulted earlier in admission; outpatient follow-up  Diabetes mellitus, type 2 On metformin as an outpatient  Essential hypertension Stable -Continue metoprolol  BPH -Continue tamsulosin  Homelessness Social work consulted.  Chest pain Right sided. No mention of rib fracture on chest x-ray or CT scan. Reproducible. Likely costochondritis. Some mild improvement with analgesics and Voltaren gel initially now persistent pain despite topical analgesics. Dedicated rib X-ray without evidence of fracture. Continues to worsen. -Continue Lidocaine patch, Voltaren gel, Norco, IV morphine for breakthrough -Repeat chest CT   DVT prophylaxis: Lovenox Code Status:   Code Status:  DNR Family Communication: None at bedside Disposition Plan: Discharge pending determination of safe discharge plan per CSW and control of pain without IV medication   Consultants:   Pulmonology  Procedures:   Bronchoscopy (1/10)  Antimicrobials:  Vancomycin  Cefepime  Flagyl    Subjective: Right sided chest pain continues to be significant.  Objective: Vitals:   10/01/18 1835 10/01/18 2359 10/02/18 0753 10/02/18 1300  BP: (!) 107/52 120/69 125/77   Pulse: 70 72 88   Resp: 18 10 20    Temp: 98.7 F (37.1 C) (!) 97.5 F (36.4 C) 97.8 F (36.6 C)   TempSrc:  Oral    SpO2: 98% (!) 87% 97% 99%  Weight:      Height:       No intake or output data in the 24 hours ending 10/02/18 1708 Filed Weights   09/25/18 0123 09/25/18 1743 09/28/18 0821  Weight: 79.4 kg 78.7 kg 78.7 kg    Examination:  General exam: Appears calm and comfortable Respiratory system: Rhonchi on right side. Respiratory effort normal. Chest: significant tenderness on palpation; patient jumped back from amount of pain Cardiovascular system: S1 & S2 heard, RRR. No murmurs, rubs, gallops or clicks. Gastrointestinal system: Abdomen is nondistended, soft and nontender. No organomegaly or masses felt. Normal bowel sounds heard. Central nervous system: Alert and oriented. No focal neurological deficits. Extremities: No edema. No calf tenderness Skin: No cyanosis. No rashes Psychiatry: Judgement and insight appear normal. Mood & affect appropriate.   Data Reviewed: I have personally reviewed following labs and imaging studies  CBC: Recent Labs  Lab 09/26/18 1414  WBC 10.9*  HGB 11.3*  HCT 35.1*  MCV 90.5  PLT 492   Basic Metabolic Panel: Recent Labs  Lab 09/26/18 1414 09/29/18 0205  NA 137 136  K 3.9 4.2  CL 105 102  CO2 21* 24  GLUCOSE 119* 150*  BUN 19 27*  CREATININE 1.12 1.02  CALCIUM 9.2 9.2   GFR: Estimated Creatinine Clearance: 59.5 mL/min (by C-G formula based on SCr of 1.02  mg/dL). Liver Function Tests: No results for input(s): AST, ALT, ALKPHOS, BILITOT, PROT, ALBUMIN in the last 168 hours. No results for input(s): LIPASE, AMYLASE in the last 168 hours. No results for input(s): AMMONIA in the last 168 hours. Coagulation Profile: No results for input(s): INR, PROTIME in the last 168 hours. Cardiac Enzymes: Recent Labs  Lab 09/26/18 1411 09/26/18 2115 09/27/18 0304  TROPONINI <0.03 <0.03 <0.03   BNP (last 3 results) No results for input(s): PROBNP in the last 8760 hours. HbA1C: No results for input(s): HGBA1C in the last 72 hours. CBG: Recent Labs  Lab 09/28/18 1107 10/02/18 1046  GLUCAP 97 161*   Lipid Profile: No results for input(s): CHOL, HDL, LDLCALC, TRIG, CHOLHDL, LDLDIRECT in the last 72 hours. Thyroid Function Tests: No results for input(s): TSH, T4TOTAL, FREET4, T3FREE, THYROIDAB in the last 72 hours. Anemia Panel: No results for input(s): VITAMINB12, FOLATE, FERRITIN, TIBC, IRON, RETICCTPCT in the last 72 hours. Sepsis Labs: Recent Labs  Lab 09/27/18 0304 09/28/18 0338  PROCALCITON <0.10 <0.10    Recent Results (from the past 240 hour(s))  Gram stain     Status: None   Collection Time: 09/25/18 11:47 AM  Result Value Ref Range Status   Specimen Description PLEURAL LEFT  Final   Special Requests NONE  Final   Gram Stain   Final    FEW WBC PRESENT,BOTH PMN AND MONONUCLEAR NO ORGANISMS SEEN Performed at [facility], [address], [place], [place] [address]    Report Status 09/25/2018 FINAL  Final  Culture, body fluid-bottle     Status: None   Collection Time: 09/25/18 11:47 AM  Result Value Ref Range Status   Specimen Description PLEURAL LEFT  Final   Special Requests NONE  Final   Culture   Final    NO GROWTH 5 DAYS Performed at [facility] [address] [place], [place] [address]    Report Status 09/30/2018 FINAL  Final  Culture, blood (routine x 2)     Status: None   Collection Time: 09/27/18   1:38 PM  Result Value Ref Range Status   Specimen Description BLOOD LEFT ANTECUBITAL  Final   Special Requests AEROBIC BOTTLE ONLY Blood Culture adequate volume  Final   Culture   Final    NO GROWTH 5 DAYS Performed at [facility], [place] [address], [place], [place] [address]    Report Status 10/02/2018 FINAL  Final  Culture, blood (routine x 2)     Status: None   Collection Time: 09/27/18  1:38 PM  Result Value Ref Range Status   Specimen Description BLOOD LEFT HAND  Final   Special Requests AEROBIC BOTTLE ONLY Blood Culture adequate volume  Final   Culture   Final    NO GROWTH 5 DAYS Performed at [facility], [place] [address], [place], [place] [address]    Report Status 10/02/2018 FINAL  Final  Respiratory Panel by PCR     Status: None   Collection Time: 09/27/18  4:05 PM  Result Value Ref Range Status   Adenovirus NOT DETECTED NOT DETECTED Final   Coronavirus 229E NOT DETECTED NOT DETECTED Final   Coronavirus HKU1 NOT  DETECTED NOT DETECTED Final   Coronavirus NL63 NOT DETECTED NOT DETECTED Final   Coronavirus OC43 NOT DETECTED NOT DETECTED Final   Metapneumovirus NOT DETECTED NOT DETECTED Final   Rhinovirus / Enterovirus NOT DETECTED NOT DETECTED Final   Influenza A NOT DETECTED NOT DETECTED Final   Influenza B NOT DETECTED NOT DETECTED Final   Parainfluenza Virus 1 NOT DETECTED NOT DETECTED Final   Parainfluenza Virus 2 NOT DETECTED NOT DETECTED Final   Parainfluenza Virus 3 NOT DETECTED NOT DETECTED Final   Parainfluenza Virus 4 NOT DETECTED NOT DETECTED Final   Respiratory Syncytial Virus NOT DETECTED NOT DETECTED Final   Bordetella pertussis NOT DETECTED NOT DETECTED Final   Chlamydophila pneumoniae NOT DETECTED NOT DETECTED Final   Mycoplasma pneumoniae NOT DETECTED NOT DETECTED Final    Comment: Performed at [facility], [place] [address], [place], [place] [address]  MRSA PCR Screening     Status: None   Collection Time: 09/28/18  7:51 AM  Result Value  Ref Range Status   MRSA by PCR NEGATIVE NEGATIVE Final    Comment:        The GeneXpert MRSA Assay (FDA approved for NASAL specimens only), is one component of a comprehensive MRSA colonization surveillance program. It is not intended to diagnose MRSA infection nor to guide or monitor treatment for MRSA infections. Performed at [facility], [place] [address], [place], [place] [address]   Culture, respiratory     Status: None   Collection Time: 09/28/18 10:42 AM  Result Value Ref Range Status   Specimen Description BRONCHIAL ALVEOLAR LAVAGE  Final   Special Requests NONE  Final   Gram Stain   Final    RARE WBC PRESENT, PREDOMINANTLY PMN NO ORGANISMS SEEN Performed at [facility], [place] [address] [place], [place] [address]    Culture NO GROWTH 2 DAYS  Final   Report Status 10/01/2018 FINAL  Final         Radiology Studies: Dg Chest Port 1 View  Result Date: 10/01/2018 CLINICAL DATA:  Shortness of breath. Right-sided chest pain. Cough. EXAM: PORTABLE CHEST 1 VIEW COMPARISON:  09/30/2018.  09/25/2018.  06/06/2018.  CT 09/25/2018. FINDINGS: Mediastinum and hilar structures normal. Chronic bilateral interstitial prominence with bibasilar atelectasis. Active interstitial lung disease including pneumonitis can not be excluded. Severe bullous COPD. Reference is made to prior CT report of 09/25/2018 for description of findings in the left chest suggesting malignancy. Previously identified left pleural effusion of clear stiff cleared. No pneumothorax. Cardiomegaly with no pulmonary venous congestion. IMPRESSION: 1.  Stable cardiomegaly.  No pulmonary venous congestion. 2. Chronic bilateral interstitial prominence with bibasilar atelectasis. Active interstitial lung disease including pneumonitis can not be excluded. Severe bullous COPD. Reference is made to prior CT report 09/25/2018 description of findings in the left chest suggesting malignancy. Previously identified left pleural  effusion appears to cleared. A follow-up chest CT may prove useful for further evaluation. Electronically Signed   By: Marcello Moores  Register   On: 10/01/2018 12:57        Scheduled Meds: . atorvastatin  40 mg Oral Daily  . diclofenac sodium  2 g Topical QID  . docusate sodium  100 mg Oral BID  . guaiFENesin  600 mg Oral BID  . ipratropium-albuterol  3 mL Nebulization TID  . lidocaine  1 patch Transdermal Q24H  . metoprolol tartrate  50 mg Oral BID  . mometasone-formoterol  2 puff Inhalation BID  . predniSONE  40  mg Oral Q breakfast  . sodium chloride HYPERTONIC  4 mL Nebulization Daily  . tamsulosin  0.4 mg Oral Daily   Continuous Infusions: . lactated ringers 10 mL/hr at 09/28/18 3606     LOS: 6 days     Cordelia Poche, MD [facility] 10/02/2018, 5:08 PM  If 7PM-7AM, please contact night-coverage [email or web address]

## 2018-10-02 NOTE — Clinical Social Work Note (Signed)
CSW spoke with pt at bedside. Pt states he was staying in a hotel prior to admission. Pt states he gets a SS check. CSW inquired about what pt would like to do at d/c. Pt states "I might go to [place] with my wife's folks." CSW ask what pt would like for CSW to assist with including the idea of ALF. Pt states "I go through this every time Im in the hospital." Pt begins to get angry with CSW. Pt states " I wish y'all would just leave me alone." CSW asked again what pt's d/c plan would be and pt states "I DK!." Pt again stated " I wish y'all would just leave me alone." Pt is refusing CSW help at this time.   [place], [place]

## 2018-10-02 NOTE — Telephone Encounter (Signed)
A new patient appointment has been scheduled for the pt to see Dr. Julien Nordmann on 1/28 at 3pm w/labs at 230pm. Letter mailed.

## 2018-10-03 ENCOUNTER — Inpatient Hospital Stay (HOSPITAL_COMMUNITY): Payer: Medicare PPO

## 2018-10-03 ENCOUNTER — Ambulatory Visit: Payer: Self-pay | Admitting: Internal Medicine

## 2018-10-03 DIAGNOSIS — R0902 Hypoxemia: Secondary | ICD-10-CM

## 2018-10-03 DIAGNOSIS — Z7189 Other specified counseling: Secondary | ICD-10-CM

## 2018-10-03 DIAGNOSIS — C3492 Malignant neoplasm of unspecified part of left bronchus or lung: Secondary | ICD-10-CM

## 2018-10-03 MED ORDER — SENNA 8.6 MG PO TABS: 1.0000 | ORAL_TABLET | Freq: Two times a day (BID) | ORAL | Status: DC

## 2018-10-03 MED ORDER — OXYCODONE-ACETAMINOPHEN 5-325 MG PO TABS
1.0000 | ORAL_TABLET | ORAL | Status: DC | PRN
  Administered 2018-10-03 – 2018-10-04 (×5): 1 via ORAL
  Filled 2018-10-03 (×5): qty 1

## 2018-10-03 MED ORDER — SENNOSIDES-DOCUSATE SODIUM 8.6-50 MG PO TABS
2.0000 | ORAL_TABLET | Freq: Two times a day (BID) | ORAL | Status: DC
  Administered 2018-10-03 – 2018-10-04 (×3): 2 via ORAL
  Filled 2018-10-03 (×3): qty 2

## 2018-10-03 MED ORDER — OXYCODONE HCL 5 MG PO TABS
5.0000 mg | ORAL_TABLET | ORAL | Status: DC | PRN
  Administered 2018-10-03 – 2018-10-04 (×4): 5 mg via ORAL
  Filled 2018-10-03 (×5): qty 1

## 2018-10-03 MED ORDER — KETOROLAC TROMETHAMINE 15 MG/ML IJ SOLN
15.0000 mg | Freq: Four times a day (QID) | INTRAMUSCULAR | Status: DC
  Administered 2018-10-03 – 2018-10-04 (×4): 15 mg via INTRAVENOUS
  Filled 2018-10-03 (×4): qty 1

## 2018-10-03 MED ORDER — METHOCARBAMOL 500 MG PO TABS
500.0000 mg | ORAL_TABLET | Freq: Three times a day (TID) | ORAL | Status: DC
  Administered 2018-10-03 – 2018-10-04 (×3): 500 mg via ORAL
  Filled 2018-10-03 (×3): qty 1

## 2018-10-03 MED ORDER — FAMOTIDINE 20 MG PO TABS
20.0000 mg | ORAL_TABLET | Freq: Every day | ORAL | Status: DC
  Administered 2018-10-03 – 2018-10-04 (×2): 20 mg via ORAL
  Filled 2018-10-03 (×2): qty 1

## 2018-10-03 MED ORDER — POLYETHYLENE GLYCOL 3350 17 G PO PACK
17.0000 g | PACK | Freq: Every day | ORAL | Status: DC
  Administered 2018-10-03 – 2018-10-04 (×2): 17 g via ORAL
  Filled 2018-10-03 (×2): qty 1

## 2018-10-03 NOTE — Progress Notes (Addendum)
NAME:  [name] [name], MRN:  [id number], DOB:  04-Jan-1934, LOS: 37 ADMISSION DATE:  09/25/2018, CONSULTATION DATE:  09/27/2018 REFERRING MD:  Dr. Tawanna Solo, CHIEF COMPLAINT:  Left upper lobe mass  Brief History   85 year old male former smoker ( quit 2019 with a 50 pack year smoking history))  found to have left upper lobe mass in addition to multiple nodules, including a bilobed nodule in the lingula, mediastinal lymphadenopathy, and left pleural effusion (pleural fluid with negative cytology for malignancy). Oncology consulted and felt that pulmonary should be consulted for further investigation and consideration for EBUS and biopsy.     Past Medical History  Former smoker, COPD on home O2, diabetes, HTN, dyslipidemia, GERD, anxiety, depression, restless leg syndrome, lumbar spinal stenosis, kidney stones, colon cancer (dx 11/2017 in [place] s/p partial colectomy- no chemo or radiation per patient), DNR  Of note: He was treated a few weeks ago at [facility] as well as [place] for cellulitis with suspicious necrotizing fasciitis and septic shock requiring intubation. He was rehabed at [facility]>> he left there AMA.>> remains deconditioned from hospitalization.  Significant Hospital Events   1/7 Admit to [facility]  Consults:  Oncology 1/8  Procedures:  1/8 left US guided thoracentesis- 300 ml of clear red fluid  MRI Brain>> most likely be done as an outpatient  Significant Diagnostic Tests:  1/7 CTA chest PE >> 3.1 cm masslike finding in the left upper lobe along the major fissure. There are additional nodules within the left lung, both pleural and peribronchovascular. Although multiple nodules are seen, there limited to the upper lobe. A bilobed nodule in the lingula measures 25 x 18 mm on series 2, image 102. There is a small left pleural effusion with subpleural nodular appearance, likely malignant. Dependent atelectasis. Bullous emphysema worse on the left. Impression:  1.  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 2. Negative for pulmonary embolism. 3. Bullous emphysema.  1/8 TTE >> limited study, LVEF 55-60%, RV not well visualized by at least mildly dilated; RV function looks preserved; moderate PH with PAP 64 mmHg and mild septal flattening suggestive of RV pressure/ volume overload  1/10>> FOB Dr. Lake Bells adenocarcinoma  Micro Data:  Sputum cx >>neg Blood cultures >>neg RVP >>neg  Antimicrobials:  1/9 vancomycin >>off 1/9 cefepime >>off 1/9 flagyl >>off  Interim history/subjective:  No acute distress at rest.  Approaching [facility] hospital benefit. Objective   Blood pressure (!) 112/57, pulse 76, temperature (!) 97.5 F (36.4 C), temperature source Oral, resp. rate 18, height 5\' 11"  (1.803 m), weight 78.7 kg, SpO2 93 %.        Intake/Output Summary (Last 24 hours) at 10/03/2018 0958 Last data filed at 10/02/2018 1920 Gross per 24 hour  Intake 240 ml  Output -  Net 240 ml   Filed Weights   09/25/18 0123 09/25/18 1743 09/28/18 0821  Weight: 79.4 kg 78.7 kg 78.7 kg    Examination: General:-year-old male no acute distress. HEENT: JVD or lymphadenopathy is appreciated Neuro: Follows commands moves all extremities CV: s1s2 rrr, no m/r/g PULM: even/non-labored, lungs bilaterally breath sounds in the bases JS:HFWY, non-tender, bsx4 active  Extremities: warm/dry, negative edema  Skin: no rashes or lesions   Assessment & Plan:   Chronic Hypoxic respiratory failure  COPD LUL lung mass NSCLCA HTN BPH Deconditioning    Left upper lobe mass (NSCLCA) from FOB  1/10 Plan He has a follow-up appointment scheduled with Dr. Earlie Server Oncology work-up per oncology.  Chronic hypoxic respiratory failure 2/2  COPD on home O2/ bullous emphysema L>R  Possible AECOPD; has significant right-sided pleuritic  discomfort and wheezing Plan Continue O2 is at home Is on taper currently on 40 mg this can be decreased by 10 mg every 4 days until all PRN chest x-ray We needed ambulatory saturations prior to discharge Home O2 as needed note he was on 2 L at home Pulmonary follow-up as an outpatient.Bronchoscopy was done per Dr. Lake Bells. Pulmonary Follow up as OP.>> Will need to determine [place] vs [place] office at discharge, as he does not know where he is going to go upon DC.  Left pleural effusion Exudative by lights -cultures and cytology neg Plan Follow-up imaging as an outpatient Clinical resampling if pleural effusion recurs  Right chest discomfort Plan: Per primary team.  Treatment per primary team he has history of polysubstance abuse may be impacting his need for pain medication.   Of note: Pt. Has been told he does not qualify for rehab. He is very angry about this and has told SW he does not want any help. He is aware he has OP follow up in [facility] office scheduled for tomorrow 1/15. He has told me he cannot make that appointment.I will cancel this appointment as the patient is afraid he will be charged a No Show fee.   Primary team will need to make hospital follow up appointment at discharge. Pulmonary will make Pulmonary Follow up Appointment>> location to be determined on his discharge plans.  Evaluated by the [facility] team, follow-up with Dr. Lake Bells as an outpatient.  Will be arranged in outpatient setting.    Richardson [name] Minor ACNP Maryanna Shape PCCM Pager [phone] till 1 pm If no answer page [phone] 10/03/2018, 9:58 AM  Attending Note:  85 year old male smoker who presents to the hospital with LUL mass that underwent and EBUS and was noted to have DISH and was seen by oncology.  Patient was also noted to be hypoxemic.  On exam, decreased BS in all lung fields.  I reviewed chest CT myself, LUL mass noted.  Discussed with PCCM-NP.  Lung mass:  NSCLC  - F/U  MTOC  - F/U with Dr. Lake Bells and Dr. Julien Nordmann as outpatient  Hypoxemia:  - Titrate O2 for sat of 88-92%  - Will need an ambulatory desaturation study prior to discharge for home O2  GOC:  - DNR status noted  - As work up progresses may need to consider involvement of palliative care  PCCM will sign off, please call back if needed.  Patient seen and examined, agree with above note.  I dictated the care and orders written for this patient under my direction.  Rush Farmer, [place]

## 2018-10-03 NOTE — Progress Notes (Signed)
PROGRESS NOTE    [name] [name]  ZHY:[id number] DOB: Jan 21, 1934 DOA: 09/25/2018 PCP: McLean-Scocuzza, Nino Glow, MD   Brief Narrative: [name] [name] is a 85 y.o. man with medical history significant for hypertension, hyperlipidemia, diabetes, COPD on home oxygen. He presented secondary to requesting help with home oxygen, found to have a lung mass found to be secondary to [name] lung caner.   Assessment & Plan:   Principal Problem:   Chronic respiratory failure (HCC) Active Problems:   Essential hypertension   Diabetes mellitus type 2 in nonobese (HCC)   Polysubstance abuse ([place])   Mass of upper lobe of left lung   Mass of left lung   Chronic respiratory failure with hypoxia Initial concern for possible pneumonia. procalcitonin undetectable. Patient was started on empiric vancomycin, flagyl and cefepime. Antibiotics discontinued in setting of undetectable procalcitonin and low suspicion for infection. -Oxygen therapy as needed -PT recommending HHPT  COPD -Continue Duoneb, Dulera  Left upper lobe lung mass Per discussion with pulmonology, patient with non-small cell carcinoma. Patient has quit smoking within the last couple of months. Recent pleural fluid without evidence of malignancy. -Medical oncology consulted earlier in admission; outpatient follow-up  Diabetes mellitus, type 2 On metformin as an outpatient  Essential hypertension Stable -Continue metoprolol  BPH -Continue tamsulosin  Homelessness Social work consulted.  Chest pain Right sided. No mention of rib fracture on chest x-ray or CT scan. Reproducible. Likely costochondritis. Some mild improvement with analgesics and Voltaren gel initially now persistent pain despite topical analgesics. Dedicated rib X-ray without evidence of fracture. Continues to worsen. -Repeat chest CT there is no other acute abnormality. Pulmonary actually signed off. Discontinue IV morphine.  Continue current pain regimen.   DVT  prophylaxis: Lovenox Code Status:   Code Status: DNR Family Communication: None at bedside Disposition Plan: Discharge home tomorrow.   Consultants:   Pulmonology  Procedures:   Bronchoscopy (1/10)  Antimicrobials:  Vancomycin  Cefepime  Flagyl    Subjective: [place] that his pain is not well controlled.  No nausea no vomiting.  Although the patient is reporting severe pain while having a joke with the chaplain.  Objective: Vitals:   10/03/18 0745 10/03/18 0816 10/03/18 1504 10/03/18 1643  BP:  (!) 112/57  129/68  Pulse:  76  72  Resp:      Temp:  (!) 97.5 F (36.4 C)  (!) 97.5 F (36.4 C)  TempSrc:  Oral    SpO2: 98% 93% 98% 99%  Weight:      Height:       No intake or output data in the 24 hours ending 10/03/18 1949 Filed Weights   09/25/18 0123 09/25/18 1743 09/28/18 0821  Weight: 79.4 kg 78.7 kg 78.7 kg    Examination:  General exam: Appears calm and comfortable Respiratory system: Rhonchi on right side. Respiratory effort normal. Chest: significant tenderness on palpation; patient jumped back from amount of pain Cardiovascular system: S1 & S2 heard, RRR. No murmurs, rubs, gallops or clicks. Gastrointestinal system: Abdomen is nondistended, soft and nontender. No organomegaly or masses felt. Normal bowel sounds heard. Central nervous system: Alert and oriented. No focal neurological deficits. Extremities: No edema. No calf tenderness Skin: No cyanosis. No rashes Psychiatry: Judgement and insight appear normal. Mood & affect appropriate.   Data Reviewed: I have personally reviewed following labs and imaging studies  CBC: No results for input(s): WBC, NEUTROABS, HGB, HCT, MCV, PLT in the last 168 hours. Basic Metabolic Panel: Recent Labs  Lab  09/29/18 0205  NA 136  K 4.2  CL 102  CO2 24  GLUCOSE 150*  BUN 27*  CREATININE 1.02  CALCIUM 9.2   GFR: Estimated Creatinine Clearance: 59.5 mL/min (by C-G formula based on SCr of 1.02 mg/dL). Liver  Function Tests: No results for input(s): AST, ALT, ALKPHOS, BILITOT, PROT, ALBUMIN in the last 168 hours. No results for input(s): LIPASE, AMYLASE in the last 168 hours. No results for input(s): AMMONIA in the last 168 hours. Coagulation Profile: No results for input(s): INR, PROTIME in the last 168 hours. Cardiac Enzymes: Recent Labs  Lab 09/26/18 2115 09/27/18 0304  TROPONINI <0.03 <0.03   BNP (last 3 results) No results for input(s): PROBNP in the last 8760 hours. HbA1C: No results for input(s): HGBA1C in the last 72 hours. CBG: Recent Labs  Lab 09/28/18 1107 10/02/18 1046  GLUCAP 97 161*   Lipid Profile: No results for input(s): CHOL, HDL, LDLCALC, TRIG, CHOLHDL, LDLDIRECT in the last 72 hours. Thyroid Function Tests: No results for input(s): TSH, T4TOTAL, FREET4, T3FREE, THYROIDAB in the last 72 hours. Anemia Panel: No results for input(s): VITAMINB12, FOLATE, FERRITIN, TIBC, IRON, RETICCTPCT in the last 72 hours. Sepsis Labs: Recent Labs  Lab 09/27/18 0304 09/28/18 0338  PROCALCITON <0.10 <0.10    Recent Results (from the past 240 hour(s))  Gram stain     Status: None   Collection Time: 09/25/18 11:47 AM  Result Value Ref Range Status   Specimen Description PLEURAL LEFT  Final   Special Requests NONE  Final   Gram Stain   Final    FEW WBC PRESENT,BOTH PMN AND MONONUCLEAR NO ORGANISMS SEEN Performed at [facility], [address], [place], [place] [address]    Report Status 09/25/2018 FINAL  Final  Culture, body fluid-bottle     Status: None   Collection Time: 09/25/18 11:47 AM  Result Value Ref Range Status   Specimen Description PLEURAL LEFT  Final   Special Requests NONE  Final   Culture   Final    NO GROWTH 5 DAYS Performed at [place] [address] [place], [place] [address]    Report Status 09/30/2018 FINAL  Final  Culture, blood (routine x 2)     Status: None   Collection Time: 09/27/18  1:38 PM  Result Value Ref Range  Status   Specimen Description BLOOD LEFT ANTECUBITAL  Final   Special Requests AEROBIC BOTTLE ONLY Blood Culture adequate volume  Final   Culture   Final    NO GROWTH 5 DAYS Performed at [facility], [place] [address], [place], [place] [address]    Report Status 10/02/2018 FINAL  Final  Culture, blood (routine x 2)     Status: None   Collection Time: 09/27/18  1:38 PM  Result Value Ref Range Status   Specimen Description BLOOD LEFT HAND  Final   Special Requests AEROBIC BOTTLE ONLY Blood Culture adequate volume  Final   Culture   Final    NO GROWTH 5 DAYS Performed at [facility], [place] [address], [place], [place] [address]    Report Status 10/02/2018 FINAL  Final  Respiratory Panel by PCR     Status: None   Collection Time: 09/27/18  4:05 PM  Result Value Ref Range Status   Adenovirus NOT DETECTED NOT DETECTED Final   Coronavirus 229E NOT DETECTED NOT DETECTED Final   Coronavirus HKU1 NOT DETECTED NOT DETECTED Final   Coronavirus NL63  NOT DETECTED NOT DETECTED Final   Coronavirus OC43 NOT DETECTED NOT DETECTED Final   Metapneumovirus NOT DETECTED NOT DETECTED Final   Rhinovirus / Enterovirus NOT DETECTED NOT DETECTED Final   Influenza A NOT DETECTED NOT DETECTED Final   Influenza B NOT DETECTED NOT DETECTED Final   Parainfluenza Virus 1 NOT DETECTED NOT DETECTED Final   Parainfluenza Virus 2 NOT DETECTED NOT DETECTED Final   Parainfluenza Virus 3 NOT DETECTED NOT DETECTED Final   Parainfluenza Virus 4 NOT DETECTED NOT DETECTED Final   Respiratory Syncytial Virus NOT DETECTED NOT DETECTED Final   Bordetella pertussis NOT DETECTED NOT DETECTED Final   Chlamydophila pneumoniae NOT DETECTED NOT DETECTED Final   Mycoplasma pneumoniae NOT DETECTED NOT DETECTED Final    Comment: Performed at [facility], [place] [address], [place], [place] [address]  MRSA PCR Screening     Status: None   Collection Time: 09/28/18  7:51 AM  Result Value Ref Range Status   MRSA by PCR  NEGATIVE NEGATIVE Final    Comment:        The GeneXpert MRSA Assay (FDA approved for NASAL specimens only), is one component of a comprehensive MRSA colonization surveillance program. It is not intended to diagnose MRSA infection nor to guide or monitor treatment for MRSA infections. Performed at [facility], [place] [address] [place], [place] [address]   Culture, respiratory     Status: None   Collection Time: 09/28/18 10:42 AM  Result Value Ref Range Status   Specimen Description BRONCHIAL ALVEOLAR LAVAGE  Final   Special Requests NONE  Final   Gram Stain   Final    RARE WBC PRESENT, PREDOMINANTLY PMN NO ORGANISMS SEEN Performed at [facility], [place] [address] [place], [place] [address]    Culture NO GROWTH 2 DAYS  Final   Report Status 10/01/2018 FINAL  Final         Radiology Studies: Ct Chest Wo Contrast  Result Date: 10/02/2018 CLINICAL DATA:  Shortness of breath, RIGHT upper chest pain for 1.5 weeks, history kidney stones, type II diabetes mellitus, hyperlipidemia, hypertension, bullous emphysema/COPD, asthma, colon cancer EXAM: CT CHEST WITHOUT CONTRAST TECHNIQUE: Multidetector CT imaging of the chest was performed following the standard protocol without IV contrast. Sagittal and coronal MPR images reconstructed from axial data set. COMPARISON:  09/25/2018 FINDINGS: Cardiovascular: Atherosclerotic calcifications aorta, proximal great vessels and coronary arteries. No pericardial effusion. Heart normal size. Aorta normal caliber. Mediastinum/Nodes: Base of cervical region normal appearance. No thoracic adenopathy. Few normal size mediastinal lymph nodes. Upper normal size lymph node at azygo-esophageal recess. Esophagus unremarkable. Lungs/Pleura: Bullous emphysematous changes again seen. Persistent masslike opacification at the posterior aspect of the LEFT upper lobe adjacent to major fissure question tumor measuring 3.2 x 2.6 cm. Less consolidation is seen  within the posterior upper lobe and posterior lower lobe line compared to the previous exam. Scattered areas of subpleural density in the LEFT lower lobe are identified which could represent residual atelectasis, rounded infiltrate or tumor. Pleural thickening and minimal pleural fluid in the LEFT hemithorax. Peribronchial thickening. Dependent atelectasis RIGHT lower lobe. RIGHT lung otherwise clear. Upper Abdomen: Nodular appearing upper pole of RIGHT kidney. Colonic diverticulosis. Remaining visualized upper abdomen unremarkable. Musculoskeletal: No acute osseous findings. IMPRESSION: Severe bullous emphysematous changes in both lungs with improved areas of consolidation in the LEFT upper and LEFT lower lobes. Persistent masslike area of opacity in the posterior LEFT upper lobe adjacent to the major fissure  is seen, question neoplasm; patient's recent thoracentesis was positive in cytology for adenocarcinoma. Minimal residual atelectasis and effusion as above. Aortic Atherosclerosis (ICD10-I70.0) and Emphysema (ICD10-J43.9). Electronically Signed   By: Lavonia Dana M.D.   On: 10/02/2018 19:07   Dg Chest Port 1 View  Result Date: 10/03/2018 CLINICAL DATA:  Short of breath today EXAM: PORTABLE CHEST 1 VIEW COMPARISON:  10/01/2018 FINDINGS: Mild cardiomegaly. Bullous changes in the left upper lobe are stable. Patchy opacities at the left base are stable. Right lung is grossly clear. No pneumothorax. IMPRESSION: Stable atelectasis versus airspace disease at the left base. Electronically Signed   By: Marybelle Killings M.D.   On: 10/03/2018 08:26        Scheduled Meds: . atorvastatin  40 mg Oral Daily  . diclofenac sodium  2 g Topical QID  . famotidine  20 mg Oral Daily  . guaiFENesin  600 mg Oral BID  . ipratropium-albuterol  3 mL Nebulization TID  . ketorolac  15 mg Intravenous Q6H  . lidocaine  1 patch Transdermal Q24H  . methocarbamol  500 mg Oral TID  . metoprolol tartrate  50 mg Oral BID  .  mometasone-formoterol  2 puff Inhalation BID  . polyethylene glycol  17 g Oral Daily  . predniSONE  40 mg Oral Q breakfast  . senna-docusate  2 tablet Oral BID  . sodium chloride HYPERTONIC  4 mL Nebulization Daily  . tamsulosin  0.4 mg Oral Daily   Continuous Infusions: . lactated ringers 10 mL/hr at 09/28/18 6484     LOS: 7 days     Author:  Berle Mull, MD [facility] 10/03/2018  If 7PM-7AM, please contact night-coverage To reach On-call, see [email or web address]  If 7PM-7AM, please contact night-coverage [email or web address]

## 2018-10-03 NOTE — Progress Notes (Signed)
SATURATION QUALIFICATIONS: (This note is used to comply with regulatory documentation for home oxygen)  Patient Saturations on Room Air at Rest = 94%  Patient Saturations on Room Air while Ambulating = 86%  Patient Saturations on 4 Liters of oxygen while Ambulating = 94%  Please briefly explain why patient needs home oxygen: Patient's O2 sats decreased to mid 80s while ambulating and required up to 4L to come up to 94-95%. Patient also had to take frequent rest breaks due to being tired and SOB.

## 2018-10-03 NOTE — Progress Notes (Signed)
   10/03/18 1134  Clinical Encounter Type  Visited With Patient  Visit Type Follow-up;Spiritual support;Other (Comment) (AD)  Referral From Nurse  Consult/Referral To Chaplain  The chaplain responded to consult for AD and Pt. Prayer.  The chaplain was welcomed into the Pt. room for spiritual care.  The conversation continued to the "mountains" the Pt. has experienced and feels he will experience with his new diagnosis. The Pt. very clearly references his faith in God and belief in eternal life.  At this time, the chaplain heard the Pt. state he is still depending on the medical team and God for his healthcare. The chaplain left the AD document and a brief introduction with the Pt.   The chaplain found the Pt. to be open to the future AD discussion.  The chaplain left the Pt with laughter and prayer. The chaplain will F/U with spiritual care as needed.

## 2018-10-03 NOTE — Clinical Social Work Note (Addendum)
Pt refused CSW services twice yesterday. RNCM setting up [place]. Clinical Social Worker will sign off for now as social work intervention is no longer needed. Please consult [place] again if new need arises.   Shelton Silvas A Aeon Koors 10/03/2018

## 2018-10-03 NOTE — Care Management Note (Signed)
Case Management Note  Patient Details  Name: OLUWATIMILEYIN VIVIER MRN: [id number] Date of Birth: 1934-03-14  Subjective/Objective:     NCM spoke with [facility] for the home oxygen, patient states his home address is  [address] [place] [address].  [facility] will bring a tank to patient in the am, they are awaiting ambulatory sats to be put in epic.  NCM spoke with Estill Bamberg.    1/15 [name] RN, BSN - spoke with Leatrice Jewels at [facility], informed that the sats are in and will put new order in for oxygen.  NCM spoke with patient  And offered choice from Medicare.gov list for HHPT, he states he is thinking about [facility] or [place],  NCM informed him that [facility] is now [facility], he states he will let NCM know tomorrow and that he does not want to pick one right now, he became frustrated and stated "dont send anyone else in this room".  NCM was going to ask about the rolling walker  But since he became frustrated , will wait til tomorrow to ask him if he wants the rolling walker.                             Action/Plan: NCM will follow for transition of care needs.   Expected Discharge Date:  09/26/18               Expected Discharge Plan:  [place])  In-House Referral:  Clinical Social Work  Discharge planning Services  CM Consult  Post Acute Care Choice:  Durable Medical Equipment, Home Health Choice offered to:  Patient  DME Arranged:  Oxygen DME Agency:  [facility]  HH Arranged:  PT HH Agency:     Status of Service:  In process, will continue to follow  If discussed at Long Length of Stay Meetings, dates discussed:    Additional Comments:  Zenon Mayo, RN 10/03/2018, 2:47 PM

## 2018-10-04 ENCOUNTER — Other Ambulatory Visit: Payer: Self-pay | Admitting: *Deleted

## 2018-10-04 ENCOUNTER — Emergency Department (HOSPITAL_COMMUNITY)
Admission: EM | Admit: 2018-10-04 | Discharge: 2018-10-05 | Discharge status: Against Medical Advice | Payer: Medicare PPO | Attending: Emergency Medicine | Admitting: Emergency Medicine

## 2018-10-04 ENCOUNTER — Other Ambulatory Visit: Payer: Self-pay

## 2018-10-04 DIAGNOSIS — Z5320 Procedure and treatment not carried out because of patient's decision for unspecified reasons: Secondary | ICD-10-CM | POA: Insufficient documentation

## 2018-10-04 DIAGNOSIS — Z87891 Personal history of nicotine dependence: Secondary | ICD-10-CM | POA: Insufficient documentation

## 2018-10-04 DIAGNOSIS — I10 Essential (primary) hypertension: Secondary | ICD-10-CM | POA: Insufficient documentation

## 2018-10-04 DIAGNOSIS — E119 Type 2 diabetes mellitus without complications: Secondary | ICD-10-CM | POA: Insufficient documentation

## 2018-10-04 DIAGNOSIS — R0981 Nasal congestion: Secondary | ICD-10-CM | POA: Insufficient documentation

## 2018-10-04 DIAGNOSIS — R0602 Shortness of breath: Secondary | ICD-10-CM

## 2018-10-04 DIAGNOSIS — R05 Cough: Secondary | ICD-10-CM | POA: Insufficient documentation

## 2018-10-04 DIAGNOSIS — Z9981 Dependence on supplemental oxygen: Secondary | ICD-10-CM | POA: Insufficient documentation

## 2018-10-04 DIAGNOSIS — J449 Chronic obstructive pulmonary disease, unspecified: Secondary | ICD-10-CM | POA: Insufficient documentation

## 2018-10-04 DIAGNOSIS — Z85038 Personal history of other malignant neoplasm of large intestine: Secondary | ICD-10-CM | POA: Insufficient documentation

## 2018-10-04 LAB — BASIC METABOLIC PANEL
Anion gap: 7 (ref 5–15)
BUN: 35 mg/dL — ABNORMAL HIGH (ref 8–23)
CO2: 26 mmol/L (ref 22–32)
Calcium: 9.2 mg/dL (ref 8.9–10.3)
Chloride: 102 mmol/L (ref 98–111)
Creatinine, Ser: 1.31 mg/dL — ABNORMAL HIGH (ref 0.61–1.24)
GFR calc Af Amer: 58 mL/min — ABNORMAL LOW (ref >60)
GFR calc non Af Amer: 50 mL/min — ABNORMAL LOW (ref >60)
Glucose, Bld: 129 mg/dL — ABNORMAL HIGH (ref 70–99)
Potassium: 4.7 mmol/L (ref 3.5–5.1)
SODIUM: 135 mmol/L (ref 135–145)

## 2018-10-04 LAB — CBC
HCT: 30.9 % — ABNORMAL LOW (ref 39.0–52.0)
Hemoglobin: 9.6 g/dL — ABNORMAL LOW (ref 13.0–17.0)
MCH: 28.1 pg (ref 26.0–34.0)
MCHC: 31.1 g/dL (ref 30.0–36.0)
MCV: 90.4 fL (ref 80.0–100.0)
Platelets: 292 10*3/uL (ref 150–400)
RBC: 3.42 MIL/uL — AB (ref 4.22–5.81)
RDW: 14.8 % (ref 11.5–15.5)
WBC: 12.9 10*3/uL — AB (ref 4.0–10.5)
nRBC: 0 % (ref 0.0–0.2)

## 2018-10-04 LAB — MAGNESIUM: Magnesium: 2.3 mg/dL (ref 1.7–2.4)

## 2018-10-04 MED ORDER — OXYCODONE-ACETAMINOPHEN 10-325 MG PO TABS: 1.0000 | ORAL_TABLET | Freq: Four times a day (QID) | ORAL | 0 refills | Status: AC | PRN

## 2018-10-04 MED ORDER — METHOCARBAMOL 500 MG PO TABS: 500.0000 mg | ORAL_TABLET | Freq: Three times a day (TID) | ORAL | 0 refills | Status: AC

## 2018-10-04 MED ORDER — LIDOCAINE 5 % EX PTCH: 1.0000 | MEDICATED_PATCH | CUTANEOUS | 0 refills | Status: AC

## 2018-10-04 MED ORDER — DICLOFENAC SODIUM 1 % TD GEL: 2.0000 g | Freq: Four times a day (QID) | TRANSDERMAL | 0 refills | Status: DC

## 2018-10-04 MED ORDER — SENNOSIDES-DOCUSATE SODIUM 8.6-50 MG PO TABS: 2.0000 | ORAL_TABLET | Freq: Two times a day (BID) | ORAL | 0 refills | Status: AC

## 2018-10-04 MED ORDER — POLYETHYLENE GLYCOL 3350 17 G PO PACK: 17.0000 g | PACK | Freq: Every day | ORAL | 0 refills | Status: AC

## 2018-10-04 MED ORDER — OXYCODONE-ACETAMINOPHEN 7.5-325 MG PO TABS
1.0000 | ORAL_TABLET | Freq: Once | ORAL | Status: AC
  Administered 2018-10-05: 1 via ORAL
  Filled 2018-10-04: qty 1

## 2018-10-04 MED ORDER — GUAIFENESIN-DM 100-10 MG/5ML PO SYRP: 5.0000 mL | ORAL_SOLUTION | ORAL | 0 refills | Status: AC | PRN

## 2018-10-04 MED ORDER — PREDNISONE 10 MG PO TABS: ORAL_TABLET | ORAL | 0 refills | Status: AC

## 2018-10-04 MED ORDER — ALBUTEROL SULFATE (2.5 MG/3ML) 0.083% IN NEBU: 2.5000 mg | INHALATION_SOLUTION | Freq: Four times a day (QID) | RESPIRATORY_TRACT | 0 refills | Status: AC | PRN

## 2018-10-04 MED ORDER — GUAIFENESIN ER 600 MG PO TB12: 600.0000 mg | ORAL_TABLET | Freq: Two times a day (BID) | ORAL | 0 refills | Status: AC

## 2018-10-04 MED ORDER — FAMOTIDINE 20 MG PO TABS: 20.0000 mg | ORAL_TABLET | Freq: Every day | ORAL | 0 refills | Status: DC

## 2018-10-04 NOTE — Care Management Note (Addendum)
Case Management Note  Patient Details  Name: [name] [name] MRN: [id number] Date of Birth: [birth year]  Subjective/Objective:    NCM spoke with [facility] for the home oxygen, patient states his home address is [address] [name] [address]. [facility] will bring a tank to patient in the am, they are awaiting ambulatory sats to be put in epic. NCM spoke with Estill Bamberg.  1/15 [name] RN, BSN - spoke with Leatrice Jewels at [facility], informed that the sats are in and will put new order in for oxygen.  NCM spoke with patient  And offered choice from Medicare.gov list for HHPT, he states he is thinking about [facility] or [place],  NCM informed him that [facility] is now [facility], he states he will let NCM know tomorrow and that he does not want to pick one right now, he became frustrated and stated "dont send anyone else in this room".  NCM was going to ask about the rolling walker  But since he became frustrated , will wait til tomorrow to ask him if he wants the rolling walker.    1/16 [name] RN, BSN - Patient wants outpatient HHPT, HHOT.  NCM will send referral thru epic.  Also he states he has a neb machine.   Patient states he does not want a walker.                                              Action/Plan: DC home when receives home oxygen from [place].  Expected Discharge Date:  10/04/18               Expected Discharge Plan:  OP Rehab(hotel- [place])  In-House Referral:  Clinical Social Work  Discharge planning Services  CM Consult  Post Acute Care Choice:  Durable Medical Equipment Choice offered to:  Patient  DME Arranged:  Oxygen, Nebulizer machine DME Agency:  [facility]  HH Arranged:    [place] Agency:     Status of Service:  Completed, signed off  If discussed at [employer], dates discussed:    Additional Comments:  Zenon Mayo, RN 10/04/2018, 9:48 AM

## 2018-10-04 NOTE — ED Triage Notes (Signed)
Per EMS pt was in parking lot and was trying to come in the hospital to come in and did not make  It. Pt has hx of COPD and found out Friday he had lung cancer. Pt has been coughing and more pain since Friday after his biopsy. Lungs sound junky in all fields. Pt is on 3L of O2 at home. 142/60,  hr 74

## 2018-10-04 NOTE — Progress Notes (Signed)
Physical Therapy Treatment Patient Details Name: [name] [name] MRN: [id number] DOB: 10/17/1933 Today's Date: 10/04/2018    History of Present Illness Patient is a 85 y/o male who presents with SOB, cough and bloody sputum. Also wants more 02 as his 47 concentrator is not working. Noted to have a left upper lobe mass and mediastinal lymphadenopathy s/p bronchoscopy 1/10. PMH includes COPD, DM, HTN, HLD,     PT Comments    Patient progressing well with therapy, increasing ambulation distance to 300'. Desat by end of session, rest break, returns into 90's with 1-2 minute of pursed lip breathing. Progressed past needs of HHPT, updated rec to OP PT.     Follow Up Recommendations  Supervision for mobility/OOB;Outpatient PT     Equipment Recommendations  Rolling walker with 5" wheels    Recommendations for Other Services       Precautions / Restrictions Precautions Precautions: Fall Restrictions Weight Bearing Restrictions: No    Mobility  Bed Mobility Overal bed mobility: Independent                Transfers Overall transfer level: Modified independent Equipment used: Rolling walker (2 wheeled);None                Ambulation/Gait Ambulation/Gait assistance: Min guard;Supervision Gait Distance (Feet): 300 Feet Assistive device: Rolling walker (2 wheeled) Gait Pattern/deviations: Step-to pattern;Step-through pattern Gait velocity: decreased    General Gait Details: Pt ambulating unit without LOB, min guard at times for safety, SpO2 88% after 300' on 3L, patient with 1 standing rest break.    Stairs             Wheelchair Mobility    Modified Rankin (Stroke Patients Only)       Balance Overall balance assessment: Needs assistance Sitting-balance support: Feet supported;No upper extremity supported Sitting balance-Leahy Scale: Good     Standing balance support: During functional activity;No upper extremity supported Standing balance-Leahy Scale:  Fair                              Cognition Arousal/Alertness: Awake/alert Behavior During Therapy: WFL for tasks assessed/performed Overall Cognitive Status: Within Functional Limits for tasks assessed                                        Exercises      General Comments        Pertinent Vitals/Pain Pain Assessment: Faces Faces Pain Scale: Hurts a little bit Pain Location: chest Pain Descriptors / Indicators: Aching;Discomfort;Guarding;Grimacing Pain Intervention(s): Limited activity within patient's tolerance    Home Living                      Prior Function            PT Goals (current goals can now be found in the care plan section) Acute Rehab PT Goals Patient Stated Goal: to get stronger and find somewhere to live PT Goal Formulation: With patient Time For Goal Achievement: 10/12/18 Potential to Achieve Goals: Good Progress towards PT goals: Progressing toward goals    Frequency    Min 3X/week      PT Plan Current plan remains appropriate    Co-evaluation              AM-PAC PT "6 Clicks" Mobility   Outcome Measure  Help needed turning from your back to your side while in a flat bed without using bedrails?: None Help needed moving from lying on your back to sitting on the side of a flat bed without using bedrails?: None Help needed moving to and from a bed to a chair (including a wheelchair)?: A Little Help needed standing up from a chair using your arms (e.g., wheelchair or bedside chair)?: A Little Help needed to walk in hospital room?: A Little Help needed climbing 3-5 steps with a railing? : A Little 6 Click Score: 20    End of Session Equipment Utilized During Treatment: Gait belt Activity Tolerance: Patient tolerated treatment well Patient left: in chair;with call bell/phone within reach Nurse Communication: Mobility status PT Visit Diagnosis: Pain;Difficulty in walking, not elsewhere classified  (R26.2);Unsteadiness on feet (R26.81);Muscle weakness (generalized) (M62.81)     Time: 8887-5797 PT Time Calculation (min) (ACUTE ONLY): 24 min  Charges:  $Gait Training: 23-37 mins                    Reinaldo Berber, PT, DPT Acute Rehabilitation Services Pager: [phone] Office: [place] 10/04/2018, 10:00 AM

## 2018-10-04 NOTE — Progress Notes (Signed)
CSW contacted patient's social worker Loann Quill with APS, who is investigating case with patient. Alerted Albina Billet that patient is being discharged today, to home as he has refused all CSW intervention at the hospital. Albina Billet to follow patient in the community to continue her investigation.  Laveda Abbe, [place] Clinical Social Worker [phone]

## 2018-10-04 NOTE — Progress Notes (Signed)
The proposed treatment discussed in cancer conference 10/04/2018 is for discussion purpose only and not a binding recommendation.  The patient was not physically examine nor present for their treatment options.  Therefore, final treatment plans cannot be decided.

## 2018-10-05 ENCOUNTER — Emergency Department (HOSPITAL_COMMUNITY): Payer: Medicare PPO

## 2018-10-05 LAB — I-STAT TROPONIN, ED: Troponin i, poc: 0.01 ng/mL (ref 0.00–0.08)

## 2018-10-05 NOTE — ED Notes (Signed)
Pt very upset. Stating "y'all didn't do anything for me." Cussing at staff and repeatedly stating he wants to leave. Pt removed all monitoring equipment and got himself dressed. EDP (Mesner ) and Agricultural consultant [employer]) notified and paperwork printed. Pt leaving AMA.

## 2018-10-05 NOTE — ED Provider Notes (Signed)
Emergency Department Provider Note   I have reviewed the triage vital signs and the nursing notes.   HISTORY  Chief Complaint Shortness of Breath   HPI [name] [name] is a 85 y.o. male who presents emergency department for weakness.  Patient was discharged in the hospital approximately 10 hours prior to arrival.  Had physical therapy done earlier today that state he can walk 300 feet with some shortness of breath but quickly recovered.  Was not a candidate for skilled nursing.  On review of the records appears that social work had discussed with him multiple times showing good assisted living however he refused and want to go to rehab however does not qualify for rehab.  Patient is very confrontational, angry and states that he does not want to spend his own money to get better he would rather his insurance paid for it.  He feels like none of the doctors or therapist know him because they do not spend enough time with him.  Patient has some upper airway congestion and cough which he states he cannot cough because it hurts too bad.  He states he did not get any of his medications filled since leaving the hospital. No other associated or modifying symptoms.    Past Medical History:  Diagnosis Date  . ABSCESS 12/03/2009  . ABSCESS, FINGER 04/07/2010  . ANXIETY 11/03/2009  . Asthma   . Colon cancer ([place])   . COPD 11/03/2009   on home O2  . DEPRESSION 11/03/2009  . DIABETES MELLITUS, TYPE II 11/03/2009  . [place] DISEASE, LUMBAR 11/03/2009  . EMPHYSEMA, BULLOUS 11/03/2009  . GERD 11/03/2009  . HYPERLIPIDEMIA 11/03/2009  . HYPERTENSION 11/03/2009  . Kidney stones 01/30/12   "I've had them 7 times; always have passed them"  . On home oxygen therapy    "2L prn" (09/25/2018)  . PEPTIC ULCER DISEASE 11/03/2009  . Pneumonia   . RASH-NONVESICULAR 11/03/2009  . RESTLESS LEG SYNDROME 11/03/2009  . Shortness of breath    "sometimes; at any time"  . SPINAL STENOSIS, LUMBAR 11/03/2009    Patient Active  Problem List   Diagnosis Date Noted  . Mass of left lung   . Chronic respiratory failure ([place]) 09/25/2018  . Mass of upper lobe of left lung 09/25/2018  . COPD with acute exacerbation ([place]) 07/26/2018  . Congestive heart failure ([place])   . Palliative care by specialist   . DNR (do not resuscitate)   . COPD exacerbation ([place]) 03/24/2017  . Hypoxia 03/24/2017  . Diabetes mellitus type 2 in nonobese ([place]) 03/24/2017  . Polysubstance abuse ([place]) 03/24/2017  . Pressure injury of skin 03/24/2017  . Overdose of benzodiazepine 01/16/2017  . Unresponsiveness   . HCAP (healthcare-associated pneumonia)   . Respiratory failure ([place]) 01/15/2017  . CVA (cerebral vascular accident) ([place]) 12/15/2016  . Chest pain 08/27/202018  . PNA (pneumonia) 03/17/2012  . Weakness generalized 03/17/2012  . Generalized weakness 01/30/2012  . Fall at home 01/30/2012  . Physical deconditioning 01/30/2012  . Nausea vomiting and diarrhea 01/15/2012  . UTI (urinary tract infection) 01/15/2012  . Cocaine abuse ([place]) 08/11/2011  . Tobacco abuse 08/11/2011  . Orthostasis 12/23/2010  . Dehydration 12/23/2010  . Abdominal pain, other specified site 12/23/2010  . Dizziness 12/23/2010  . Weight loss 12/23/2010  . Left lumbar radiculopathy 12/23/2010  . HYPERLIPIDEMIA 11/03/2009  . ANXIETY 11/03/2009  . DEPRESSION 11/03/2009  . RESTLESS LEG SYNDROME 11/03/2009  . Essential hypertension 11/03/2009  . EMPHYSEMA, BULLOUS 11/03/2009  .  ASTHMA 11/03/2009  . COPD 11/03/2009  . GERD 11/03/2009  . PEPTIC ULCER DISEASE 11/03/2009  . [place] DISEASE, LUMBAR 11/03/2009  . SPINAL STENOSIS, LUMBAR 11/03/2009  . NEPHROLITHIASIS, HX OF 11/03/2009    Past Surgical History:  Procedure Laterality Date  . Colon cancer surgery    . [place]   left  . INGUINAL HERNIA REPAIR  10/2011   left  . IR THORACENTESIS ASP PLEURAL SPACE W/IMG GUIDE  09/25/2018  . ROTATOR CUFF REPAIR  2003   left  . TONSILLECTOMY  1960  .  VIDEO BRONCHOSCOPY WITH ENDOBRONCHIAL ULTRASOUND N/A 09/28/2018   Procedure: VIDEO BRONCHOSCOPY WITH ENDOBRONCHIAL ULTRASOUND;  Surgeon: Juanito Doom, MD;  Location: [facility];  Service: Thoracic;  Laterality: N/A;    Current Outpatient Rx  . Order #: [id number] Class: Print  . Order #: [id number] Class: Normal  . Order #: [id number] Class: Print  . Order #: [id number] Class: Print  . Order #: [id number] Class: Normal  . Order #: [id number] Class: Normal  . Order #: [id number] Class: Print  . Order #: [id number] Class: Print  . Order #: [id number] Class: Normal  . Order #: [id number] Class: Normal  . Order #: [id number] Class: Normal  . Order #: [id number] Class: Normal  . Order #: [id number] Class: Print  . Order #: [id number] Class: Normal  . Order #: [id number] Class: Normal  . Order #: [id number] Class: Normal  . Order #: [id number] Class: Normal  . Order #: [id number] Class: Print    Allergies Patient has no known allergies.  Family History  Problem Relation Age of Onset  . Heart disease Father   . Heart disease Mother   . Cancer Brother        lung    Social History Social History   Tobacco Use  . Smoking status: Former Smoker    Packs/day: 1.00    Years: 50.00    Pack years: 50.00    Types: Cigarettes    Last attempt to quit: 08/19/2018    Years since quitting: 0.1  . Smokeless tobacco: Never Used  . Tobacco comment: "stopped smoking 04/21/1991 then restarted in 2012"  Substance Use Topics  . Alcohol use: Never    Frequency: Never  . Drug use: Not Currently    Review of Systems  All other systems negative except as documented in the HPI. All pertinent positives and negatives as reviewed in the HPI. ____________________________________________   PHYSICAL EXAM:  VITAL SIGNS: ED Triage Vitals  Enc Vitals Group     BP 10/04/18 2331 138/83     Pulse Rate 10/04/18 2332 83     Resp 10/04/18 2332 13     Temp 10/04/18 2336 (!) 97.5 F (36.4 C)     Temp Source 10/04/18 2336 Oral      SpO2 10/04/18 2332 95 %     Weight 10/04/18 2336 178 lb (80.7 kg)     Height 10/04/18 2336 5\' 10"  (1.778 m)    Constitutional: Alert and oriented. Confrontational and angry. Well appearing and in no acute distress. Eyes: Conjunctivae are normal. PERRL. EOMI. Head: Atraumatic. Nose: No congestion/rhinnorhea. Mouth/Throat: Mucous membranes are moist.  Oropharynx non-erythematous. Neck: No stridor.  No meningeal signs.   Cardiovascular: Normal rate, regular rhythm. Good peripheral circulation. Grossly normal heart sounds.   Respiratory: Normal respiratory effort.  No retractions.  Has upper airway congestion but his lungs are CTAB. Gastrointestinal: Soft and nontender. No distention.  Musculoskeletal: No lower extremity tenderness nor edema. No gross deformities of extremities. Neurologic:  Normal speech and language. No gross  focal neurologic deficits are appreciated.  Skin:  Skin is warm, dry and intact. No rash noted.  ____________________________________________   LABS (all labs ordered are listed, but only abnormal results are displayed)  Labs Reviewed  CBC WITH DIFFERENTIAL/PLATELET  COMPREHENSIVE METABOLIC PANEL  BRAIN NATRIURETIC PEPTIDE  I-STAT TROPONIN, ED   ____________________________________________  EKG   EKG Interpretation  Date/Time:  Thursday October 04 2018 23:31:56 EST Ventricular Rate:  83 PR Interval:    QRS Duration: 102 QT Interval:  415 QTC Calculation: 488 R Axis:   76 Text Interpretation:  Sinus rhythm Probable left atrial enlargement Borderline prolonged QT interval No significant change since last tracing Confirmed by Merrily Pew [phone]) on 10/05/2018 12:09:48 AM       ____________________________________________  RADIOLOGY  No results found.  ____________________________________________   PROCEDURES  Procedure(s) performed:   Procedures   ____________________________________________   INITIAL IMPRESSION / ASSESSMENT AND PLAN  / ED COURSE  Suspect patient's noisy breathing is from upper airway congestion.  Will give a dose of pain medicine and have him cough which should help.  His lungs are clear may be slightly diminished but definitely not significant.  His vital signs are normal on his home oxygen.  Had a physical therapy done this morning which stated he did not qualify for home health.  Once again reading through the notes on that they offer him assisted living and he refused he still refuses now.  I told him he can get therapy there he states is too expensive.  I told him that we would repeat a couple labs and x-ray to make sure nothing changed in the last 24 hours but this time I thought was low likelihood that he would need to be readmitted to the hospital we could have him stay in the ER and have case management talk to him in the morning but was not shows me much else they could do for him.  He was not happy with this stated she just discharge me and let me die or sending back to [place].  Will re-eval.  Told by nurse patient had his clothes on and wanted to leave. I felt this was appropriate after labs were done to ensure no changes from earlier. He was apparently insistent and left AMA/eloped prior to my discussion with him of possible risks and benefits, however I do feel he was appropriate for discharge.      Pertinent labs & imaging results that were available during my care of the patient were reviewed by me and considered in my medical decision making (see chart for details).  ____________________________________________  FINAL CLINICAL IMPRESSION(S) / ED DIAGNOSES  Final diagnoses:  Shortness of breath     MEDICATIONS GIVEN DURING THIS VISIT:  Medications  oxyCODONE-acetaminophen (PERCOCET) 7.5-325 MG per tablet 1 tablet (1 tablet Oral Given 10/05/18 0035)     NEW OUTPATIENT MEDICATIONS STARTED DURING THIS VISIT:  Discharge Medication List as of 10/05/2018  2:46 AM      Note:  This note  was prepared with assistance of Dragon voice recognition software. Occasional wrong-word or sound-a-like substitutions may have occurred due to the inherent limitations of voice recognition software.   Immanuel Fedak, Corene Cornea, MD 10/06/18 [phone]

## 2018-10-08 NOTE — Discharge Summary (Signed)
[facility] Discharge Summary   Patient: [name] [name] DDU:[id number]   PCP: McLean-Scocuzza, Nino Glow, MD DOB: Jun 09, 1934   Date of admission: 09/25/2018   Date of discharge: 10/04/2018     Discharge Diagnoses:  Principal diagnosis Acute on chronic respiratory failure secondary to progressive lung cancer Principal Problem:   Chronic respiratory failure ([place]) Active Problems:   Essential hypertension   Diabetes mellitus type 2 in nonobese ([place])   Polysubstance abuse ([place])   Mass of upper lobe of left lung   Mass of left lung   Admitted From: home Disposition:  Home with home health  Recommendations for Outpatient Follow-up:  1. Please follow-up with PCP in 1 week, need a repeat BMP. 2. Patient will follow-up with [facility] pulmonary recommended to call the office although there is an appointment scheduled. 3. Patient also has an appointment scheduled with oncology.  Follow-up Information    McLean-Scocuzza, Nino Glow, MD. Schedule an appointment as soon as possible for a visit today.   Specialty:  Internal Medicine Why:  needs a repeat BMP in 1 week.  Contact information: [place] [address] [phone]        [facility].   Specialty:  Pulmonology Why:  [place] office Contact information: [place] [id number] [place] [place] Follow up.   Why:  oxygen Contact information: [place] [address] [phone]        [facility] Follow [facility].   Specialty:  Rehabilitation Why:  they will contact you to set up apt time for outpatient physical therapy and occupational therapy. Contact information: [address] [place] [id number] [place] [address] [phone]         Diet recommendation: caraic diet  Activity: The patient is advised to gradually reintroduce usual  activities.  Discharge Condition: good  Code Status: full code  History of present illness: As per the H and P dictated on admission, "[name] [name] is a 85 y.o. male with medical history significant of RLS; HTN; HLD; DM; COPD on home O2; colon CA; and recent admission at [facility] for septic shock requiring intubation resulting from cellulitis with concern for necrotizing fasciitis.  He was discharged to [facility] for LTAC care and was released about 18 days.  He was having pain in his chest for about a week and decided to come in.  It comes and goes and has been happening since he was seen at [facility].  It gets worse with coughing, and it hurts to touch. Nothing makes it better.  +SOB.  His O2 concentrator was having trouble before he went in the hospital and now it only works for about 30 minutes before it quits.  His O2 level has been low.  +cough, productive of greenish mucus.  ED Course:  Patient must come into the hospital to qualify for a new O2 order because his concentrator broke. He is hypoxic without his home O2.  Appears to have new lung CA, incidentally."  Hospital Course:  Summary of his active problems in the hospital is as following. Acute on chronic respiratory failure from COPD -Patient with known h/o COPD on chronic home O2 -He moved from [place] to [place] several months ago -His compressor does not work and he is not able to obtain another one without documented need for home O2 in a non-emergent setting -Later on there  were concern for possible pneumonia. procalcitonin undetectable. Patient was started on empiric vancomycin, flagyl and cefepime. Antibiotics discontinued in setting of undetectable procalcitonin and low suspicion for infection. -Patient needing 4 L of oxygen on ambulation.  Will arrange on discharge.  LUL lung mass with adenocarcinoma based on the pleural fluid cytology. New diagnosis. -Patient with c/o acute on chronic pleuritic CP -He was found to have a LUL  lung mass in the region where he is complaining of pain, suspicious for bronchogenic carcinoma -IR thoracentesis was performed specimen were sent for cytology which were negative. Pulmonary was consulted, underwent bronc on 09/28/2018 with BAL and endobronchial brushing which are positive for adenocarcinoma. -Medical oncology was consulted for outpatient follow-up further staging.  Appointment scheduled on 10/16/2018.  Patient was encouraged to call the office on discharge.  Essential hypertension -Continue Lopressor  Type 2 diabetes mellitus, controlled, no complication. -Recent S0F 7.1 on 11/7 -hold Glucophage -Cover with moderate-scale SSI  Polysubstance abuse -Patient with h/o substance abuse -UDS was negative.  Homelessness -He reports living in a motel and having difficulty finding affordable housing -Education officer, museum was consulted although the patient did not want any further assistance from them.  Home with home health was recommended by PT as well which was arranged on discharge.  Pain control  - [employer] Controlled Substance Reporting System database was reviewed. - Last prescription for controlled substance was not available.  Likely due to patient recently moving to [facility]. -Patient definitely have cancer-related pain, but does have history of substance abuse in the past. -5 day supply was provided. - Patient was instructed, not to drive, operate heavy machinery, perform activities at heights, swimming or participation in water activities or provide baby sitting services while on Pain, Sleep and Anxiety Medications; until his outpatient Physician has advised to do so again.  - Also recommended to not to take more than prescribed Pain, Sleep and Anxiety Medications.  Patient was seen by physical therapy, who recommended home health, which was arranged by case manager. On the day of the discharge the patient's vitals were stable , and no other acute medical condition were  reported by patient. the patient was felt safe to be discharge at home with home health.  Consultants: IR PCCM  Procedures: Thoracentesis Bronchoscopy with BAL and endobronchial brushings  DISCHARGE MEDICATION: Allergies as of 10/04/2018   No Known Allergies     Medication List    STOP taking these medications   metFORMIN 500 MG tablet Commonly known as:  GLUCOPHAGE     TAKE these medications   albuterol 108 (90 Base) MCG/ACT inhaler Commonly known as:  PROVENTIL HFA;VENTOLIN HFA Inhale 2 puffs into the lungs every 6 (six) hours as needed. What changed:  reasons to take this   albuterol (2.5 MG/3ML) 0.083% nebulizer solution Commonly known as:  PROVENTIL Take 3 mLs (2.5 mg total) by nebulization every 6 (six) hours as needed for wheezing or shortness of breath. What changed:  You were already taking a medication with the same name, and this prescription was added. Make sure you understand how and when to take each.   aspirin 81 MG chewable tablet Chew 1 tablet (81 mg total) by mouth daily.   atorvastatin 40 MG tablet Commonly known as:  LIPITOR Take 1 tablet (40 mg total) by mouth daily.   Fluticasone-Salmeterol 250-50 MCG/DOSE Aepb Commonly known as:  ADVAIR Inhale 1 puff into the lungs 2 (two) times daily.   furosemide 20 MG tablet Commonly known as:  LASIX Take 1 tablet (20 mg total) by mouth daily as needed for fluid or edema.   guaiFENesin 600 MG 12 hr tablet Commonly known as:  MUCINEX Take 1 tablet (600 mg total) by mouth 2 (two) times daily.   guaiFENesin-dextromethorphan 100-10 MG/5ML syrup Commonly known as:  ROBITUSSIN DM Take 5 mLs by mouth every 4 (four) hours as needed for cough.   lidocaine 5 % Commonly known as:  LIDODERM Place 1 patch onto the skin daily. Remove & Discard patch within 12 hours or as directed by MD   methocarbamol 500 MG tablet Commonly known as:  ROBAXIN Take 1 tablet (500 mg total) by mouth 3 (three) times daily.    metoprolol tartrate 50 MG tablet Commonly known as:  LOPRESSOR Take 1 tablet (50 mg total) by mouth 2 (two) times daily.   oxyCODONE-acetaminophen 10-325 MG tablet Commonly known as:  PERCOCET Take 1 tablet by mouth every 6 (six) hours as needed for up to 5 days for pain.   polyethylene glycol packet Commonly known as:  MIRALAX / GLYCOLAX Take 17 g by mouth daily.   predniSONE 10 MG tablet Commonly known as:  DELTASONE Take 63m daily for 3days,Take 330mdaily for 3days,Take 2068maily for 3days,Take 35m55mily for 3days, then stop What changed:  additional instructions   senna-docusate 8.6-50 MG tablet Commonly known as:  Senokot-S Take 2 tablets by mouth 2 (two) times daily.   tamsulosin 0.4 MG Caps capsule Commonly known as:  FLOMAX Take 1 capsule (0.4 mg total) by mouth daily.      No Known Allergies Discharge Instructions    Ambulatory referral to Pulmonology   Complete by:  As directed    Diet - low sodium heart healthy   Complete by:  As directed    Discharge instructions   Complete by:  As directed    It is important that you read the given instructions as well as go over your medication list with RN to help you understand your care after this hospitalization.  Discharge Instructions: Please follow-up with PCP in 1-2 weeks  Please request your primary care physician to go over all Hospital Tests and Procedure/Radiological results at the follow up. Please get all Hospital records sent to your PCP by signing hospital release before you go home.   Do not drive, operating heavy machinery, perform activities at heights, swimming or participation in water activities or provide baby sitting services while you are on Pain, Sleep and Anxiety Medications; until you have been seen by Primary Care Physician or a Neurologist and advised to do so again. Do not take more than prescribed Pain, Sleep and Anxiety Medications. You were cared for by a hospitalist during your  hospital stay. If you have any questions about your discharge medications or the care you received while you were in the hospital after you are discharged, you can call the unit _0 @ you were admitted to and ask to speak with the hospitalist on call if the hospitalist that took care of you is not available.  Once you are discharged, your primary care physician will handle any further medical issues. Please note that NO REFILLS for any discharge medications will be authorized once you are discharged, as it is imperative that you return to your primary care physician (or establish a relationship with a primary care physician if you do not have one) for your aftercare needs so that they can reassess your need for medications and monitor your lab values. You  Must read complete instructions/literature along with all the possible adverse reactions/side effects for all the Medicines you take and that have been prescribed to you. Take any new Medicines after you have completely understood and accept all the possible adverse reactions/side effects. Wear Seat belts while driving. If you have smoked or chewed Tobacco in the last 2 yrs please stop smoking and/or stop any Recreational drug use.  If you drink alcohol, please moderate the use and do not drive, operating heavy machinery, perform activities at heights, swimming or participation in water activities or provide baby sitting services under influence.   Increase activity slowly   Complete by:  As directed      Discharge Exam: Filed Weights   09/25/18 0123 09/25/18 1743 09/28/18 0821  Weight: 79.4 kg 78.7 kg 78.7 kg   Vitals:   10/04/18 0725 10/04/18 0726  BP:  130/67  Pulse:  72  Resp:    Temp:  97.9 F (36.6 C)  SpO2: 99% 100%   General: Appear in no distress, no Rash; Oral Mucosa moist. Cardiovascular: S1 and S2 Present, no Murmur, no JVD Respiratory: Bilateral Air entry present and left Crackles, no wheezes Abdomen: Bowel Sound present,  Soft and no tenderness Extremities: no Pedal edema, no calf tenderness Neurology: Grossly no focal neuro deficit.  The results of significant diagnostics from this hospitalization (including imaging, microbiology, ancillary and laboratory) are listed below for reference.    Significant Diagnostic Studies: Dg Chest 1 View  Result Date: 09/25/2018 CLINICAL DATA:  Post left thoracentesis, probable malignancy in the left chest by CT EXAM: CHEST  1 VIEW COMPARISON:  CT chest of 09/25/2018 and chest x-ray of the same day FINDINGS: Opacity at the left lung base again is noted most consistent with effusion and possibly atelectasis. Malignancy is not well seen, better visualized by CT chest. Chronic changes are noted in the lung bases. Cardiomegaly is stable. No acute bony abnormality is seen. IMPRESSION: 1. No pneumothorax after left thoracentesis. 2. Chronic changes at the lung bases. The nodular opacities at the left lung base by CT are not well seen by chest x-ray. 3. Stable cardiomegaly. Electronically Signed   By: Ivar Drape M.D.   On: 09/25/2018 12:01   Dg Chest 2 View  Result Date: 10/05/2018 CLINICAL DATA:  85 year old male with shortness of breath and cough EXAM: CHEST - 2 VIEW COMPARISON:  Chest radiograph dated 10/03/2018 and CT dated 10/02/2018 FINDINGS: Severe bullous emphysema primarily in her left upper lobe. An area of increased density in the region of the hilum on the lateral view likely represent confluence of hilar vasculature. The focal masslike consolidation in the left lung seen on the prior CT is suboptimally visualized on this radiograph. No focal consolidation, pleural effusion, or pneumothorax. Stable mild cardiomegaly. No acute osseous pathology. IMPRESSION: 1. No acute cardiopulmonary process. 2. Severe bullous emphysema. Electronically Signed   By: Anner Crete M.D.   On: 10/05/2018 00:35   Dg Chest 2 View  Result Date: 09/25/2018 CLINICAL DATA:  Shortness of breath. EXAM:  CHEST - 2 VIEW COMPARISON:  Radiographs 08/26/2018, chest CT 06/26/2018 FINDINGS: Marked emphysema with chronic left lung hyperinflation. Nodular opacity in the left mid lung measures approximately 19 x 22 mm. Unchanged heart size and mediastinal contours with aortic atherosclerosis. Small right pleural effusion and adjacent basilar opacity. No pneumothorax. IMPRESSION: 1. Small right pleural effusion and adjacent basilar opacity. 2. Nodular opacity in the left mid lung measures 19 x 22 mm, suspicious for neoplasm,  increased from prior CT. 3. Advanced emphysema.  Emphysema (ICD10-J43.9). Electronically Signed   By: Keith Rake M.D.   On: 09/25/2018 03:07   Dg Ribs Unilateral Right  Result Date: 09/30/2018 CLINICAL DATA:  Atypical right anterior chest pain EXAM: RIGHT RIBS - 2 VIEW COMPARISON:  Chest radiograph dated 09/25/2018 FINDINGS: No displaced right rib fracture is seen. Mild bibasilar scarring. The heart is normal in size. IMPRESSION: No displaced right rib fracture is seen. Electronically Signed   By: Julian Hy M.D.   On: 09/30/2018 20:53   Ct Chest Wo Contrast  Result Date: 10/02/2018 CLINICAL DATA:  Shortness of breath, RIGHT upper chest pain for 1.5 weeks, history kidney stones, type II diabetes mellitus, hyperlipidemia, hypertension, bullous emphysema/COPD, asthma, colon cancer EXAM: CT CHEST WITHOUT CONTRAST TECHNIQUE: Multidetector CT imaging of the chest was performed following the standard protocol without IV contrast. Sagittal and coronal MPR images reconstructed from axial data set. COMPARISON:  09/25/2018 FINDINGS: Cardiovascular: Atherosclerotic calcifications aorta, proximal great vessels and coronary arteries. No pericardial effusion. Heart normal size. Aorta normal caliber. Mediastinum/Nodes: Base of cervical region normal appearance. No thoracic adenopathy. Few normal size mediastinal lymph nodes. Upper normal size lymph node at azygo-esophageal recess. Esophagus  unremarkable. Lungs/Pleura: Bullous emphysematous changes again seen. Persistent masslike opacification at the posterior aspect of the LEFT upper lobe adjacent to major fissure question tumor measuring 3.2 x 2.6 cm. Less consolidation is seen within the posterior upper lobe and posterior lower lobe line compared to the previous exam. Scattered areas of subpleural density in the LEFT lower lobe are identified which could represent residual atelectasis, rounded infiltrate or tumor. Pleural thickening and minimal pleural fluid in the LEFT hemithorax. Peribronchial thickening. Dependent atelectasis RIGHT lower lobe. RIGHT lung otherwise clear. Upper Abdomen: Nodular appearing upper pole of RIGHT kidney. Colonic diverticulosis. Remaining visualized upper abdomen unremarkable. Musculoskeletal: No acute osseous findings. IMPRESSION: Severe bullous emphysematous changes in both lungs with improved areas of consolidation in the LEFT upper and LEFT lower lobes. Persistent masslike area of opacity in the posterior LEFT upper lobe adjacent to the major fissure is seen, question neoplasm; patient's recent thoracentesis was positive in cytology for adenocarcinoma. Minimal residual atelectasis and effusion as above. Aortic Atherosclerosis (ICD10-I70.0) and Emphysema (ICD10-J43.9). Electronically Signed   By: Lavonia Dana M.D.   On: 10/02/2018 19:07   Ct Angio Chest Pe W And/or Wo Contrast  Result Date: 09/25/2018 CLINICAL DATA:  Intermediate probability for pulmonary embolism. EXAM: CT ANGIOGRAPHY CHEST WITH CONTRAST TECHNIQUE: Multidetector CT imaging of the chest was performed using the standard protocol during bolus administration of intravenous contrast. Multiplanar CT image reconstructions and MIPs were obtained to evaluate the vascular anatomy. CONTRAST:  41m ISOVUE-370 IOPAMIDOL (ISOVUE-370) INJECTION 76% COMPARISON:  06/26/2018 FINDINGS: Cardiovascular: Negative for pulmonary embolism. Normal heart size. No pericardial  effusion. Aortic and coronary atherosclerosis. Mediastinum/Nodes: Left hilar and left mediastinal lymphadenopathy. A rounded AP window lymph node measures 27 mm in diameter and is new from prior. Lungs/Pleura: 3.1 cm masslike finding in the left upper lobe along the major fissure. There are additional nodules within the left lung, both pleural and peribronchovascular. Although multiple nodules are seen, there limited to the upper lobe. A bilobed nodule in the lingula measures 25 x 18 mm on series 2, image 102. There is a small left pleural effusion with subpleural nodular appearance, likely malignant. Dependent atelectasis. Bullous emphysema worse on the left. Upper Abdomen: No acute finding. Musculoskeletal: No acute or aggressive finding Review of the MIP images  confirms the above findings. IMPRESSION: 1. 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 2. Negative for pulmonary embolism. 3. Bullous emphysema. Electronically Signed   By: Monte Fantasia M.D.   On: 09/25/2018 05:42   Dg Chest Port 1 View  Result Date: 10/03/2018 CLINICAL DATA:  Short of breath today EXAM: PORTABLE CHEST 1 VIEW COMPARISON:  10/01/2018 FINDINGS: Mild cardiomegaly. Bullous changes in the left upper lobe are stable. Patchy opacities at the left base are stable. Right lung is grossly clear. No pneumothorax. IMPRESSION: Stable atelectasis versus airspace disease at the left base. Electronically Signed   By: Marybelle Killings M.D.   On: 10/03/2018 08:26   Dg Chest Port 1 View  Result Date: 10/01/2018 CLINICAL DATA:  Shortness of breath. Right-sided chest pain. Cough. EXAM: PORTABLE CHEST 1 VIEW COMPARISON:  09/30/2018.  09/25/2018.  06/06/2018.  CT 09/25/2018. FINDINGS: Mediastinum and hilar structures normal. Chronic bilateral interstitial prominence with bibasilar atelectasis. Active interstitial lung disease  including pneumonitis can not be excluded. Severe bullous COPD. Reference is made to prior CT report of 09/25/2018 for description of findings in the left chest suggesting malignancy. Previously identified left pleural effusion of clear stiff cleared. No pneumothorax. Cardiomegaly with no pulmonary venous congestion. IMPRESSION: 1.  Stable cardiomegaly.  No pulmonary venous congestion. 2. Chronic bilateral interstitial prominence with bibasilar atelectasis. Active interstitial lung disease including pneumonitis can not be excluded. Severe bullous COPD. Reference is made to prior CT report 09/25/2018 description of findings in the left chest suggesting malignancy. Previously identified left pleural effusion appears to cleared. A follow-up chest CT may prove useful for further evaluation. Electronically Signed   By: Marcello Moores  Register   On: 10/01/2018 12:57   Ir Thoracentesis Asp Pleural Space W/img Guide  Result Date: 09/26/2018 INDICATION: Chest pains shortness of breath. Left pleural effusion. Request for diagnostic and therapeutic thoracentesis. EXAM: ULTRASOUND GUIDED LEFT THORACENTESIS MEDICATIONS: 1% lidocaine 10 mL COMPLICATIONS: None immediate. PROCEDURE: An ultrasound guided thoracentesis was thoroughly discussed with the patient and questions answered. The benefits, risks, alternatives and complications were also discussed. The patient understands and wishes to proceed with the procedure. Written consent was obtained. Ultrasound was performed to localize and mark an adequate pocket of fluid in the left chest. The area was then prepped and draped in the normal sterile fashion. 1% Lidocaine was used for local anesthesia. Under ultrasound guidance a 6 Fr Safe-T-Centesis catheter was introduced. Thoracentesis was performed. The catheter was removed and a dressing applied. FINDINGS: A total of approximately 300 mL of clear red fluid was removed. Samples were sent to the laboratory as requested by the clinical  team. IMPRESSION: Successful ultrasound guided left thoracentesis yielding 300 mL of pleural fluid. No pneumothorax on post-procedure chest x-ray. Read by: Gareth Eagle, PA-C Electronically Signed   By: Corrie Mckusick D.O.   On: 09/26/2018 08:45    Microbiology: Recent Results (from the past 240 hour(s))  Culture, respiratory     Status: None   Collection Time: 09/28/18 10:42 AM  Result Value Ref Range Status   Specimen Description BRONCHIAL ALVEOLAR LAVAGE  Final   Special Requests NONE  Final   Gram Stain   Final    RARE WBC PRESENT, PREDOMINANTLY PMN NO ORGANISMS SEEN Performed at [facility], [address] [place], [place] [address]    Culture NO GROWTH 2 DAYS  Final   Report Status 10/01/2018 FINAL  Final     Labs: CBC: Recent Labs  Lab 10/04/18 0245  WBC 12.9*  HGB 9.6*  HCT 30.9*  MCV 90.4  PLT 321   Basic Metabolic Panel: Recent Labs  Lab 10/04/18 0245  NA 135  K 4.7  CL 102  CO2 26  GLUCOSE 129*  BUN 35*  CREATININE 1.31*  CALCIUM 9.2  MG 2.3   Liver Function Tests: No results for input(s): AST, ALT, ALKPHOS, BILITOT, PROT, ALBUMIN in the last 168 hours. No results for input(s): LIPASE, AMYLASE in the last 168 hours. No results for input(s): AMMONIA in the last 168 hours. Cardiac Enzymes: No results for input(s): CKTOTAL, CKMB, CKMBINDEX, TROPONINI in the last 168 hours. BNP (last 3 results) Recent Labs    07/25/18 2131 08/09/18 0843 09/25/18 0306  BNP 71.0 54.0 30.5   CBG: Recent Labs  Lab 10/02/18 1046  GLUCAP 161*   Time spent: 35 minutes  Signed:  Berle Mull  [facility] 10/04/2018

## 2018-10-10 ENCOUNTER — Telehealth: Payer: Self-pay

## 2018-10-10 ENCOUNTER — Encounter: Payer: Self-pay | Admitting: *Deleted

## 2018-10-10 NOTE — Telephone Encounter (Signed)
-----   Message from Juanito Doom, MD sent at 09/28/2018  4:44 PM EST ----- Hi,  Can we get him in for hospital follow up with me or an NP in 2-4 weeks for lung cancer.   [name] [name]

## 2018-10-10 NOTE — Telephone Encounter (Signed)
I have been holding this message because pt had been hospitalized.  Per chart, pt was discharged with a referral to [facility] office.  Pt lives in [place].  lmtcb for pt to schedule appt with [facility] office. Routing to [facility] triage to ensure that this pt is scheduled with their office- referral was placed but no appt has been made.

## 2018-10-10 NOTE — Telephone Encounter (Signed)
Called patient's son who states patient is currently back in the hospital in [place]. A letter will be mailed to patient's home to call and schedule hospital follow up. Message will be closed. Asked patient's son if he knew patient's preference of [facility] or [facility]. He did not know.

## 2018-10-10 NOTE — Telephone Encounter (Signed)
LMTCB needs hospital follow up.

## 2018-10-15 ENCOUNTER — Other Ambulatory Visit: Payer: Self-pay | Admitting: *Deleted

## 2018-10-15 DIAGNOSIS — R918 Other nonspecific abnormal finding of lung field: Secondary | ICD-10-CM

## 2018-10-16 ENCOUNTER — Inpatient Hospital Stay: Payer: Self-pay | Admitting: Internal Medicine

## 2018-10-16 ENCOUNTER — Inpatient Hospital Stay: Payer: Medicare PPO

## 2018-10-26 ENCOUNTER — Telehealth: Payer: Self-pay | Admitting: *Deleted

## 2018-10-26 NOTE — Telephone Encounter (Signed)
Oncology Nurse Navigator Documentation  Oncology Nurse Navigator Flowsheets 10/26/2018  Navigator Location [facility]  Navigator Encounter Type Telephone/I called to re-schedule Mr. Paulos.  I was unable to reach but did leave a vm message with my name and phone number to call.   Telephone Outgoing Call  Abnormal Finding Date 09/25/2018  Confirmed Diagnosis Date 09/28/2018  Treatment Phase Pre-Tx/Tx Discussion  Barriers/Navigation Needs Coordination of Care  Interventions Coordination of Care  Coordination of Care Other  Acuity Level 1  Time Spent with Patient 15

## 2018-11-18 DEATH — deceased

## 2019-12-31 IMAGING — DX DG HAND 2V*R*
2 series · 2 of 2 positions shown · non-contrast
Comparison: None.

CLINICAL DATA: RIGHT forearm and RIGHT hand pain

EXAM:
RIGHT HAND - 2 VIEW

[hand ap]
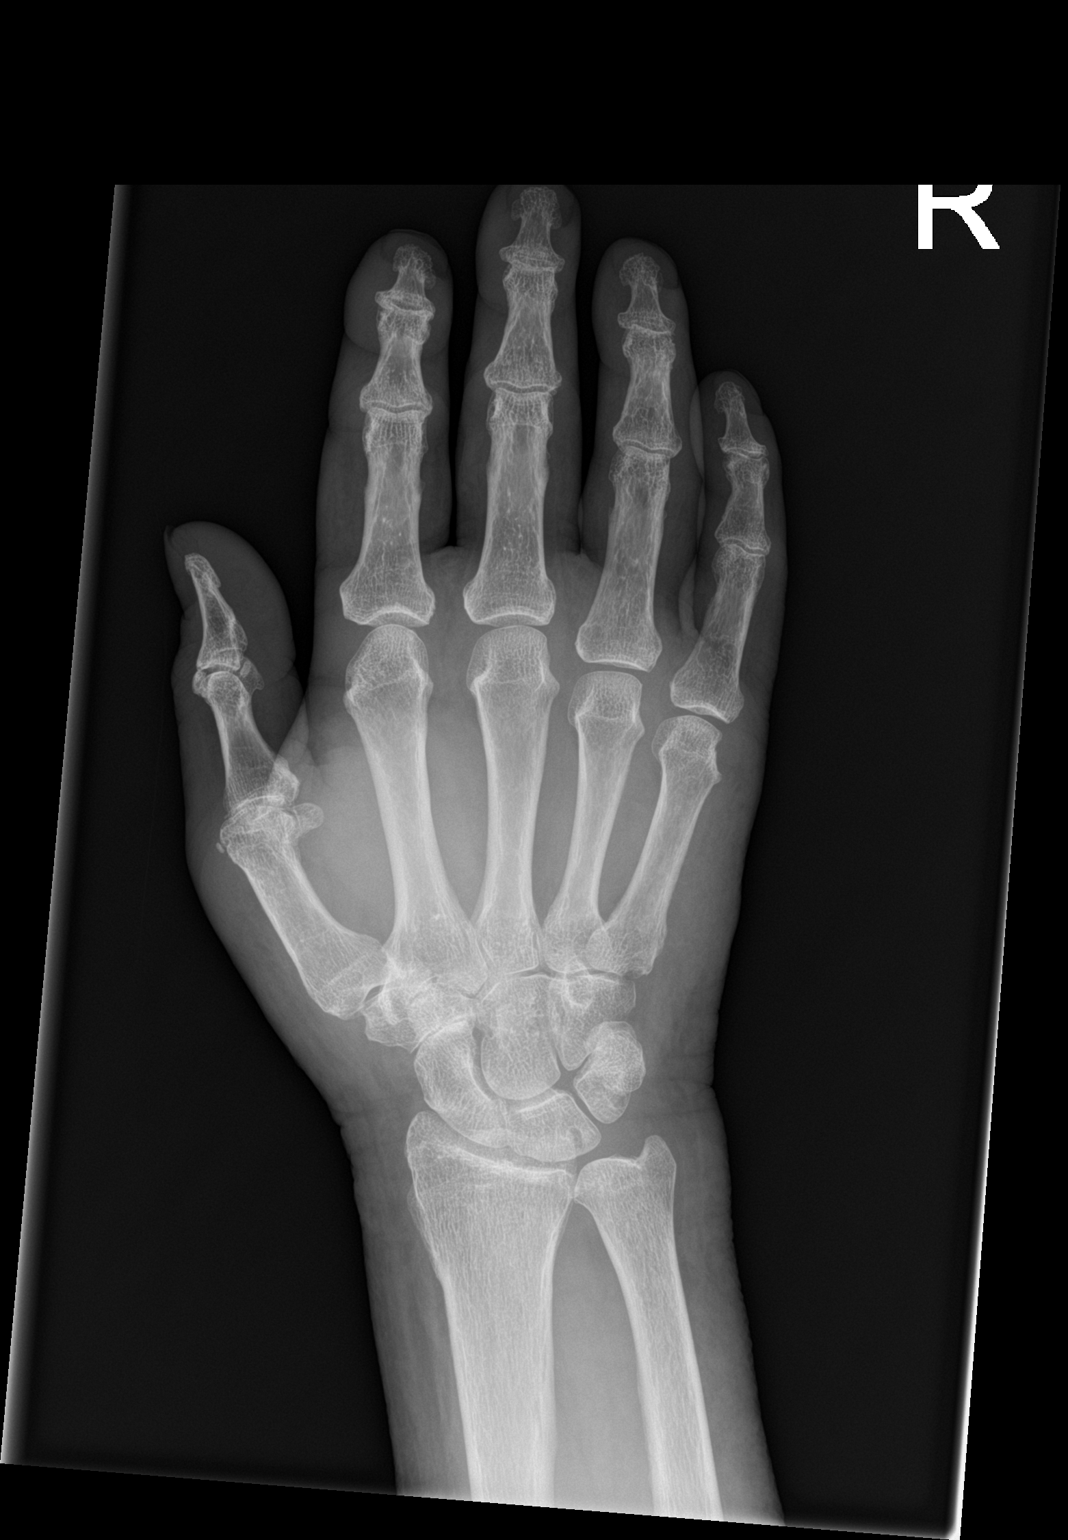

[hand lat]
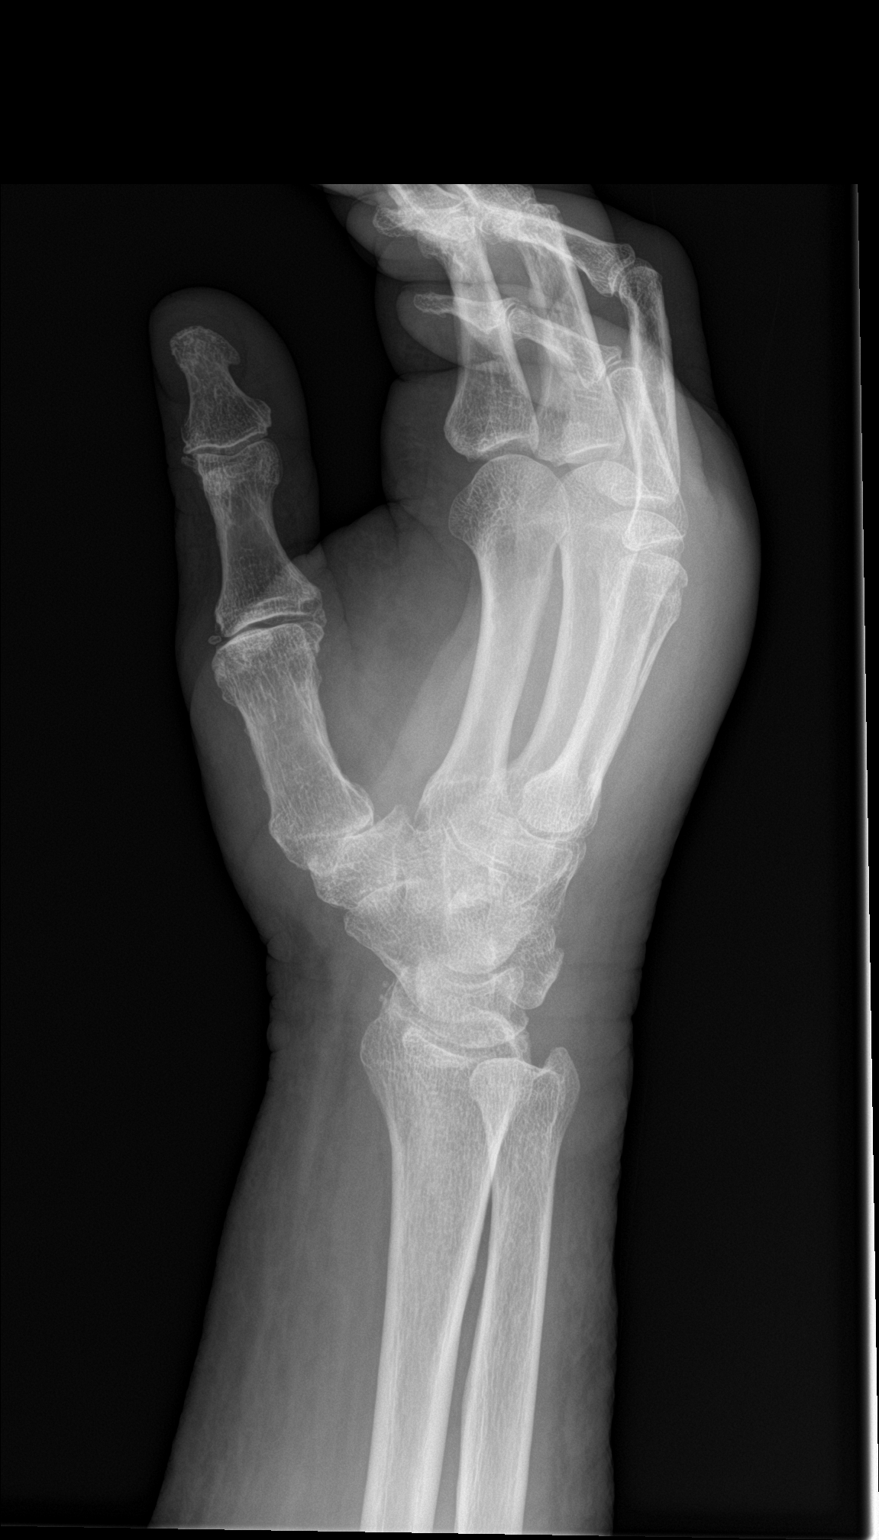

[2 of 2 positions shown; findings below may reference images not displayed]

FINDINGS: No evidence of fracture of the carpal or metacarpal bones.
Radiocarpal joint is intact. Phalanges are normal. No soft tissue
injury.

Soft tissue swelling over the dorsum of the hand.
IMPRESSION: Soft tissue swelling.  No osseous abnormality

## 2019-12-31 IMAGING — DX DG CHEST 1V PORT
1 series · 1 of 1 positions shown · non-contrast
Comparison: 08/09/2018.

CLINICAL DATA: Right forearm and right hand pain.

EXAM:
PORTABLE CHEST 1 VIEW

[chest ap]
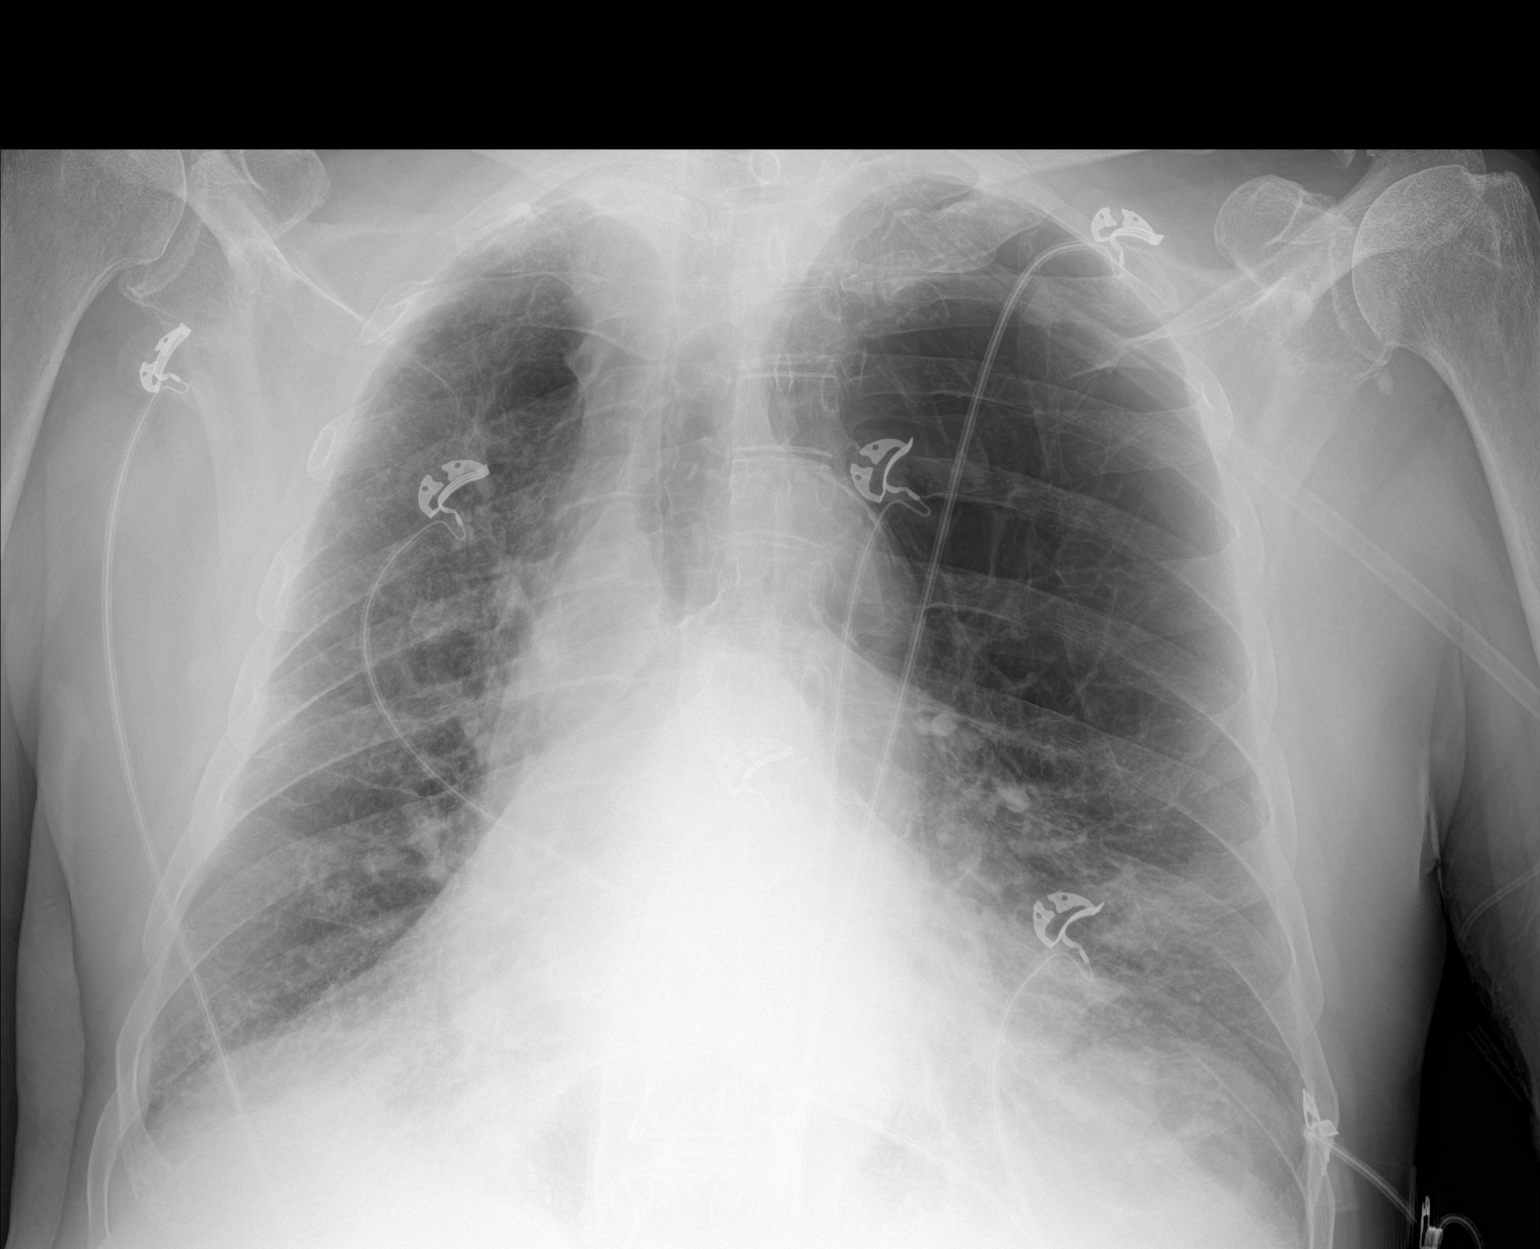

[1 of 1 positions shown; findings below may reference images not displayed]

FINDINGS: But lungs are hyperexpanded. Interstitial markings are diffusely
coarsened with chronic features. Bullous change noted in the upper
lungs, left greater than right. The cardio pericardial silhouette is
enlarged. The visualized bony structures of the thorax are intact.
Telemetry leads overlie the chest.
IMPRESSION: Stable exam. Advanced emphysema with bullous change in the upper
lungs bilaterally.

Cardiomegaly

## 2020-01-31 IMAGING — CT CT ANGIO CHEST
2 of 6 series · 19 of 36 positions shown · IV contrast (iopamidol)
Comparison: 06/26/2018

CLINICAL DATA: Intermediate probability for pulmonary embolism.

EXAM:
CT ANGIOGRAPHY CHEST WITH CONTRAST
TECHNIQUE: Multidetector CT imaging of the chest was performed using the
standard protocol during bolus administration of intravenous
contrast. Multiplanar CT image reconstructions and MIPs were
obtained to evaluate the vascular anatomy.
CONTRAST:  51mL FXKUED-92G IOPAMIDOL (FXKUED-92G) INJECTION 76%

[Series 7: pe thins · axial · 0.72mm/px · z∈[+1168,+1455]mm · 18 of 456 slices shown]
[im 23/456  lung]
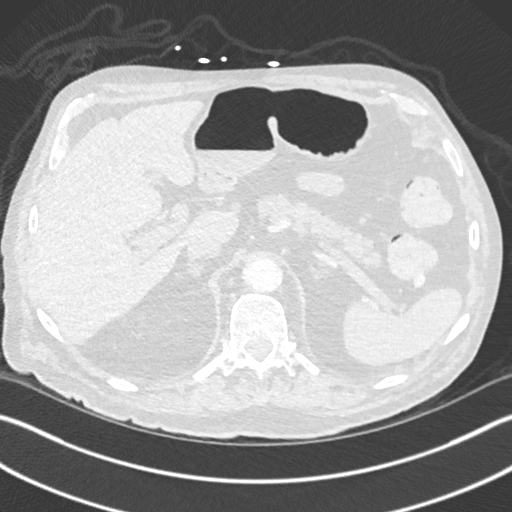
[im 46/456  mediastinal]
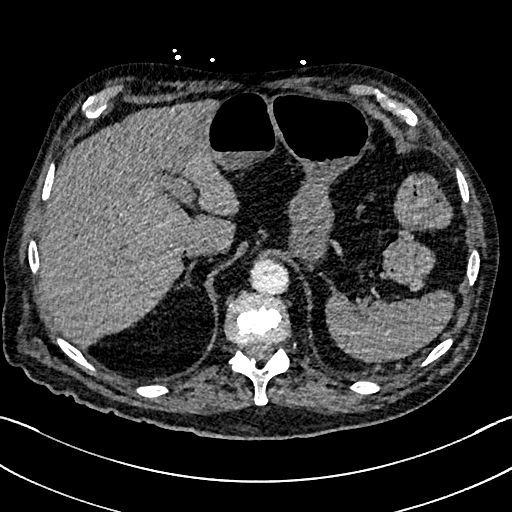
[im 69/456  lung]
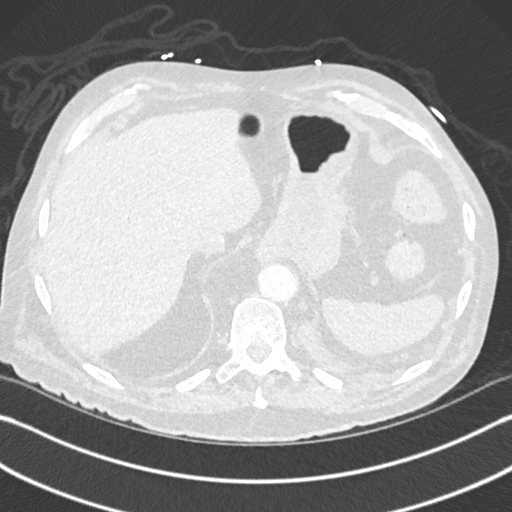
[im 92/456  mediastinal]
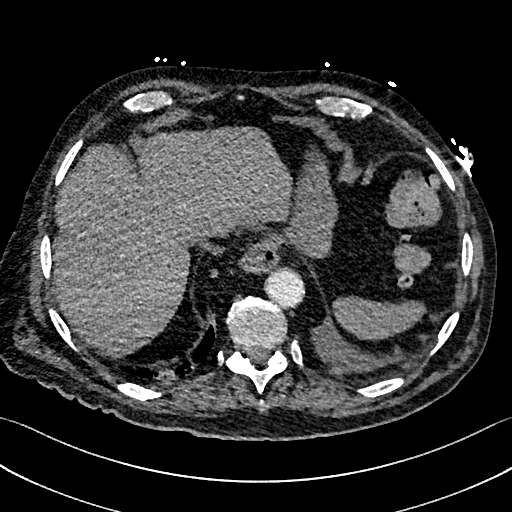
[im 114/456  lung]
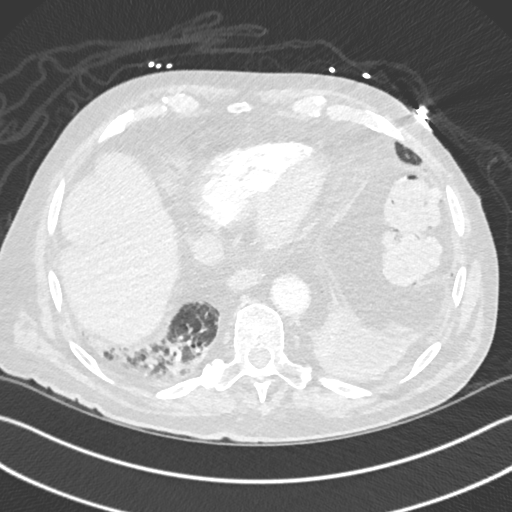
[im 137/456  mediastinal]
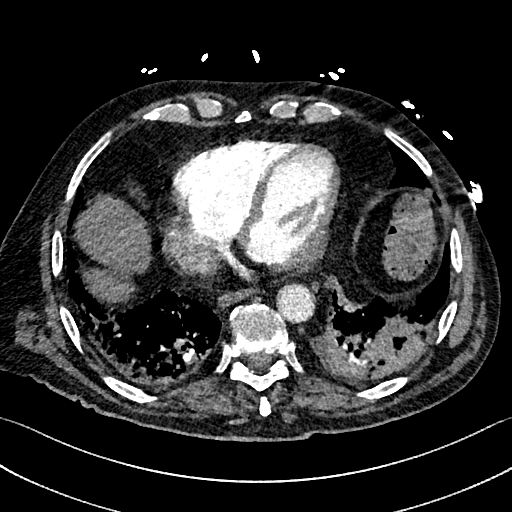
[im 160/456  lung]
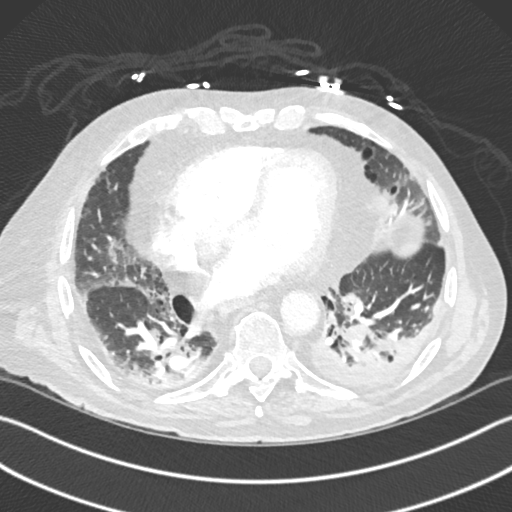
[im 183/456  mediastinal]
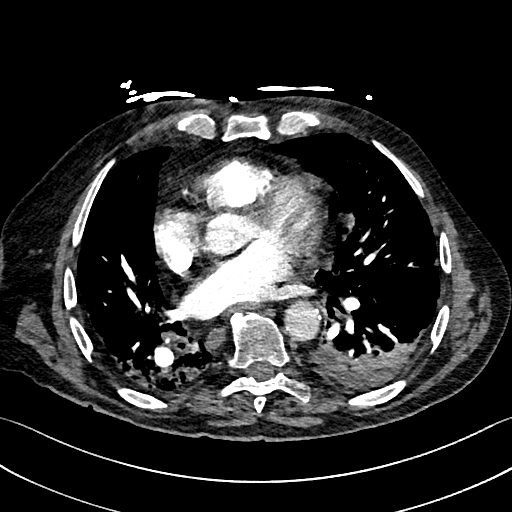
[im 205/456  lung]
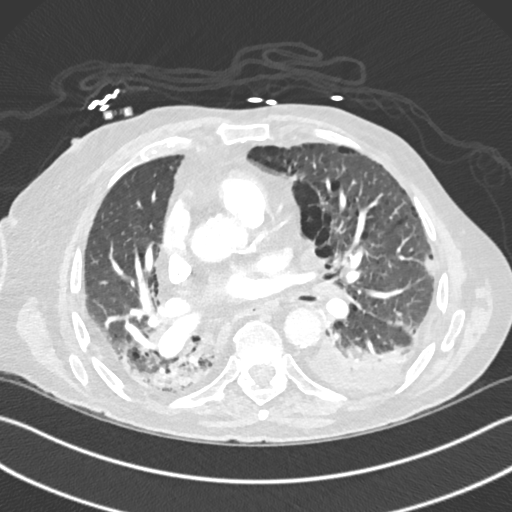
[im 251/456  mediastinal]
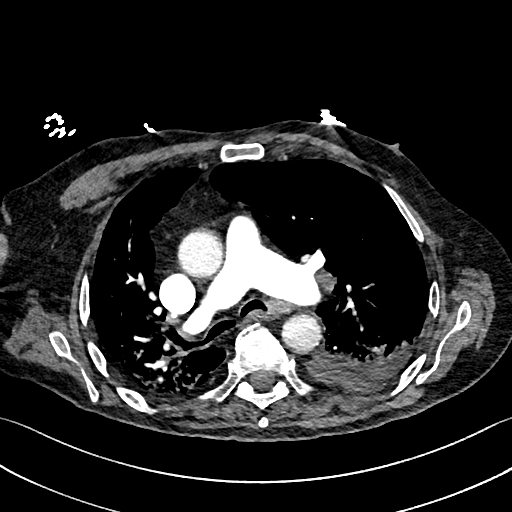
[im 274/456  lung]
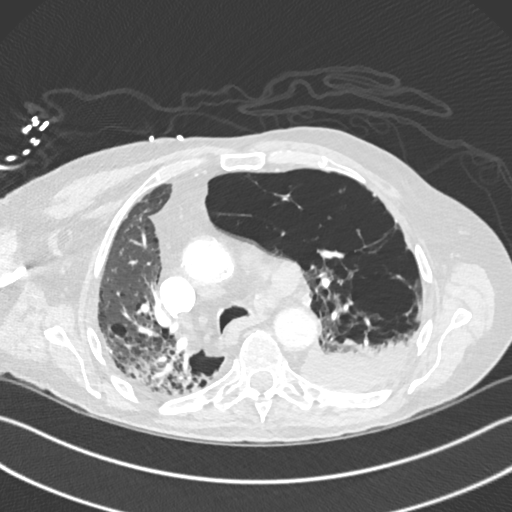
[im 296/456  mediastinal]
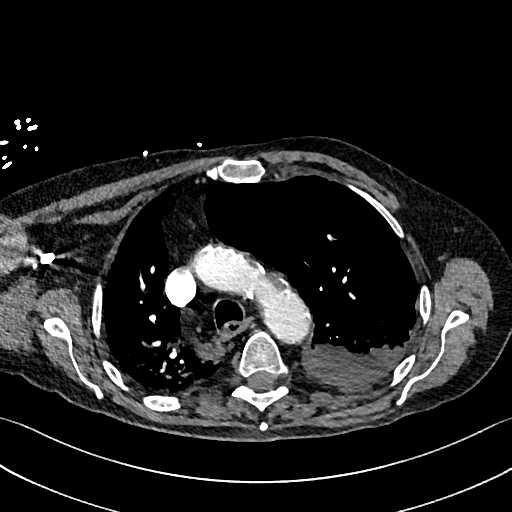
[im 319/456  lung]
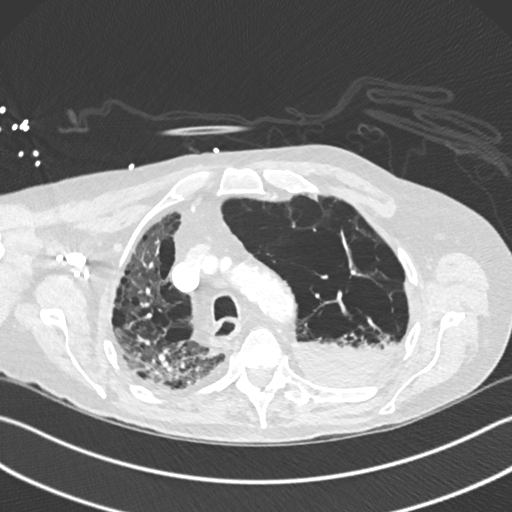
[im 342/456  mediastinal]
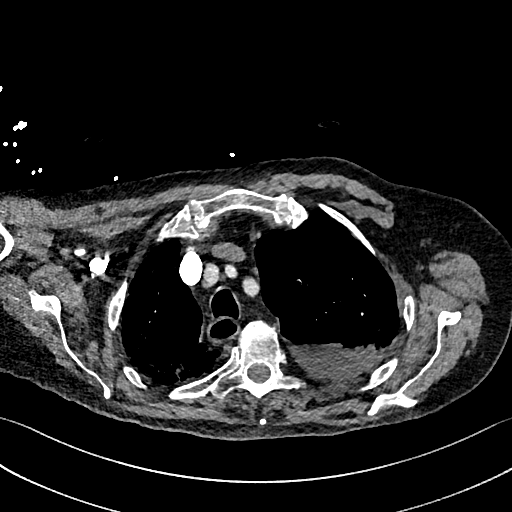
[im 365/456  lung]
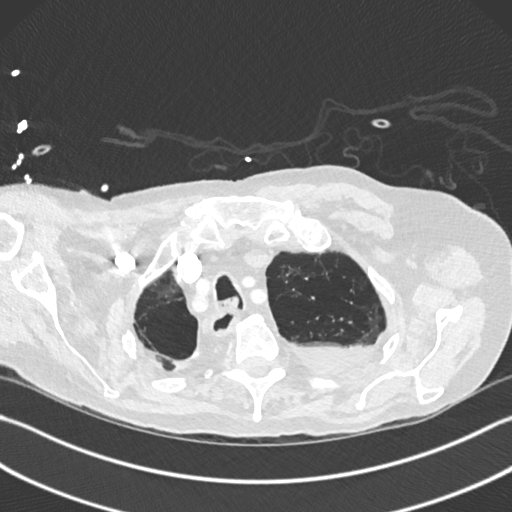
[im 387/456  mediastinal]
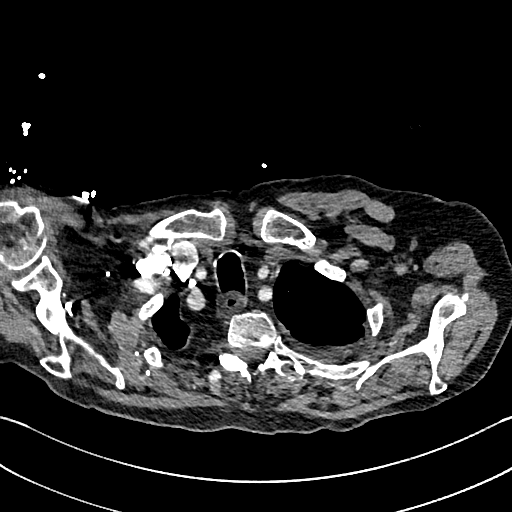
[im 410/456  lung]
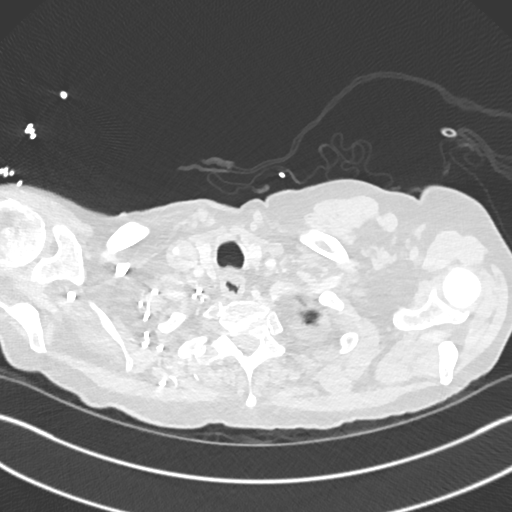
[im 433/456  mediastinal]
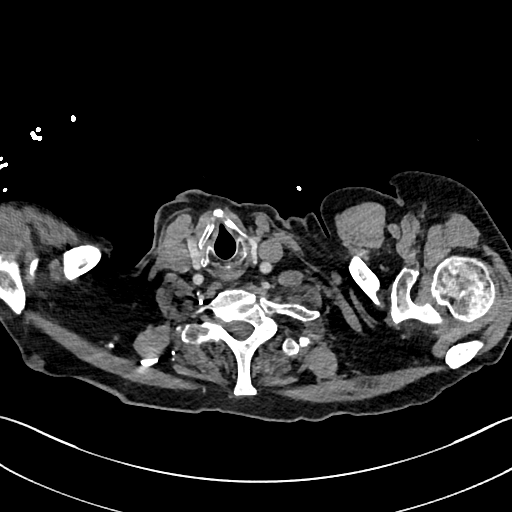

[Series 8: pe 2mm cor · coronal · 0.62mm/px · 1 of 129 slices shown]
[im 65/129  mediastinal]
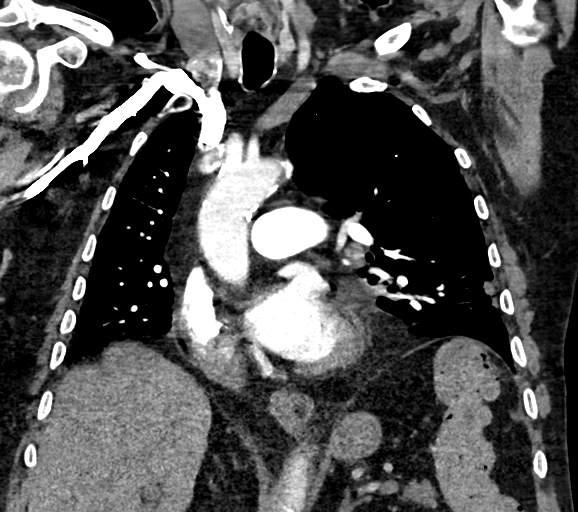

[19 of 36 positions shown; findings below may reference images not displayed]

FINDINGS: Cardiovascular: Negative for pulmonary embolism. Normal heart size.
No pericardial effusion. Aortic and coronary atherosclerosis.

Mediastinum/Nodes: Left hilar and left mediastinal lymphadenopathy.
A rounded AP window lymph node measures 27 mm in diameter and is new
from prior.

Lungs/Pleura: 3.1 cm masslike finding in the left upper lobe along
the major fissure. There are additional nodules within the left
lung, both pleural and peribronchovascular. Although multiple
nodules are seen, there limited to the upper lobe. A bilobed nodule
in the lingula measures 25 x 18 mm on series 2, image 102. There is
a small left pleural effusion with subpleural nodular appearance,
likely malignant. Dependent atelectasis. Bullous emphysema worse on
the left.

Upper Abdomen: No acute finding.

Musculoskeletal: No acute or aggressive finding

Review of the MIP images confirms the above findings.
IMPRESSION: 1. Malignant findings in the left chest, likely primary upper lobe
cancer with intralobar spread, ipsilateral adenopathy, and small but
malignant appearing left pleural effusion.
2. Negative for pulmonary embolism.
3. Bullous emphysema.
